# Patient Record
Sex: Female | Born: 1950 | Race: White | Hispanic: No | Marital: Single | Smoking: Former smoker
Health system: Southern US, Community
[De-identification: ages and names within clinical notes are randomized; demographics above are authoritative.]

## PROBLEM LIST (undated history)

## (undated) DIAGNOSIS — E119 Type 2 diabetes mellitus without complications: Secondary | ICD-10-CM

## (undated) DIAGNOSIS — H348192 Central retinal vein occlusion, unspecified eye, stable: Secondary | ICD-10-CM

## (undated) DIAGNOSIS — C799 Secondary malignant neoplasm of unspecified site: Secondary | ICD-10-CM

## (undated) DIAGNOSIS — C801 Malignant (primary) neoplasm, unspecified: Secondary | ICD-10-CM

## (undated) HISTORY — PX: TONSILLECTOMY: SUR1361

## (undated) HISTORY — PX: TUBAL LIGATION: SHX77

## (undated) HISTORY — PX: TUMOR REMOVAL: SHX12

## (undated) HISTORY — DX: Central retinal vein occlusion, unspecified eye, stable: H34.8192

---

## 1997-12-03 ENCOUNTER — Other Ambulatory Visit: Admission: RE | Admit: 1997-12-03 | Discharge: 1997-12-03 | Payer: Self-pay | Admitting: Obstetrics and Gynecology

## 1997-12-08 ENCOUNTER — Ambulatory Visit (HOSPITAL_BASED_OUTPATIENT_CLINIC_OR_DEPARTMENT_OTHER): Admission: RE | Admit: 1997-12-08 | Discharge: 1997-12-08 | Payer: Self-pay | Admitting: Orthopedic Surgery

## 1998-01-07 ENCOUNTER — Ambulatory Visit (HOSPITAL_COMMUNITY): Admission: RE | Admit: 1998-01-07 | Discharge: 1998-01-07 | Payer: Self-pay | Admitting: Obstetrics and Gynecology

## 1998-03-02 ENCOUNTER — Ambulatory Visit (HOSPITAL_COMMUNITY): Admission: RE | Admit: 1998-03-02 | Discharge: 1998-03-02 | Payer: Self-pay | Admitting: Obstetrics and Gynecology

## 2000-07-27 ENCOUNTER — Other Ambulatory Visit: Admission: RE | Admit: 2000-07-27 | Discharge: 2000-07-27 | Payer: Self-pay | Admitting: Obstetrics and Gynecology

## 2000-08-07 ENCOUNTER — Encounter: Admission: RE | Admit: 2000-08-07 | Discharge: 2000-08-07 | Payer: Self-pay | Admitting: Obstetrics and Gynecology

## 2000-08-07 ENCOUNTER — Encounter: Payer: Self-pay | Admitting: Obstetrics and Gynecology

## 2006-12-03 ENCOUNTER — Emergency Department (HOSPITAL_COMMUNITY): Admission: EM | Admit: 2006-12-03 | Discharge: 2006-12-03 | Payer: Self-pay | Admitting: Emergency Medicine

## 2007-04-05 ENCOUNTER — Other Ambulatory Visit: Admission: RE | Admit: 2007-04-05 | Discharge: 2007-04-05 | Payer: Self-pay | Admitting: Internal Medicine

## 2007-05-02 ENCOUNTER — Encounter: Admission: RE | Admit: 2007-05-02 | Discharge: 2007-05-02 | Payer: Self-pay | Admitting: Internal Medicine

## 2007-05-25 ENCOUNTER — Encounter: Admission: RE | Admit: 2007-05-25 | Discharge: 2007-05-25 | Payer: Self-pay | Admitting: Gastroenterology

## 2010-10-26 LAB — I-STAT 8, (EC8 V) (CONVERTED LAB)
Acid-Base Excess: 3 — ABNORMAL HIGH
BUN: 21
Bicarbonate: 27.1 — ABNORMAL HIGH
Chloride: 108
Glucose, Bld: 122 — ABNORMAL HIGH
HCT: 51 — ABNORMAL HIGH
Hemoglobin: 17.3 — ABNORMAL HIGH
Operator id: 257131
Potassium: 3.8
Sodium: 140
TCO2: 28
pCO2, Ven: 39.9 — ABNORMAL LOW
pH, Ven: 7.44 — ABNORMAL HIGH

## 2010-10-26 LAB — POCT I-STAT CREATININE
Creatinine, Ser: 1
Operator id: 257131

## 2014-08-14 ENCOUNTER — Telehealth: Payer: Self-pay | Admitting: *Deleted

## 2014-08-14 NOTE — Telephone Encounter (Signed)
Received a referral from [facility] requesting an urgent appt.  Obtained appt from Dr. Lindi Adie.  Called pt and confirmed 08/15/14 appt w/ her.  Unable to mail before appt letter - gave verbal.  Unable to mail welcoming packet - gave directions and instructions.  Unable to mail intake form - placed a note for one to be given at time of check in.  Called Jessica at [facility] to make her aware of the appt.  Called and left a message for Hilda Blades at PCP requesting the referral auth.  Placed a copy of the records in Dr. Geralyn Flash box and took one to HIM to scan.

## 2014-08-15 ENCOUNTER — Encounter: Payer: Self-pay | Admitting: *Deleted

## 2014-08-15 ENCOUNTER — Encounter (INDEPENDENT_AMBULATORY_CARE_PROVIDER_SITE_OTHER): Payer: Self-pay

## 2014-08-15 ENCOUNTER — Telehealth: Payer: Self-pay | Admitting: *Deleted

## 2014-08-15 ENCOUNTER — Other Ambulatory Visit: Payer: Self-pay | Admitting: *Deleted

## 2014-08-15 ENCOUNTER — Encounter: Payer: Self-pay | Admitting: Hematology and Oncology

## 2014-08-15 ENCOUNTER — Telehealth: Payer: Self-pay | Admitting: Hematology and Oncology

## 2014-08-15 ENCOUNTER — Ambulatory Visit (HOSPITAL_BASED_OUTPATIENT_CLINIC_OR_DEPARTMENT_OTHER): Payer: 59 | Admitting: Hematology and Oncology

## 2014-08-15 ENCOUNTER — Ambulatory Visit: Payer: 59

## 2014-08-15 VITALS — BP 163/93 | HR 48 | Temp 98.7°F | Resp 18 | Ht 65.5 in | Wt 231.5 lb

## 2014-08-15 DIAGNOSIS — C773 Secondary and unspecified malignant neoplasm of axilla and upper limb lymph nodes: Secondary | ICD-10-CM

## 2014-08-15 DIAGNOSIS — C50411 Malignant neoplasm of upper-outer quadrant of right female breast: Secondary | ICD-10-CM | POA: Diagnosis not present

## 2014-08-15 DIAGNOSIS — Z171 Estrogen receptor negative status [ER-]: Secondary | ICD-10-CM | POA: Diagnosis not present

## 2014-08-15 NOTE — Assessment & Plan Note (Signed)
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 After reviewing the pathology in detail, we proceeded to discuss the different treatment options between surgery, radiation, chemotherapy.  Recommendation: 1. Staging scans  2. Await HER-2 testing 3. If no Mets: Neoadjuvant chemo Foll by Mastectomy and XRT 4. If Her -2 Neg: AC x 4 foll by Abraxane-carboplatin weekly X 12 5. If Her-2 Positive: TCHP X 6 6. Echo 7. Port placement through IR 8. Chemo education  Wound care issues: Breast wound is bleeding and shes applying dressings on her own. I gave her a prescription for supplies.  Chemo counseling assuming No Mets and Her2 Neg: I discussed the risks and benefits of chemotherapy including the risks of nausea/ vomiting, risk of infection from low WBC count, fatigue due to chemo or anemia, bruising or bleeding due to low platelets, mouth sores, loss/ change in taste and decreased appetite. Liver and kidney function will be monitored through out chemotherapy as abnormalities in liver and kidney function may be a side effect of treatment. Cardiac dysfunction due to Adriamycin was discussed in detail. Risk of permanent bone marrow dysfunction and leukemia due to chemo were also discussed.

## 2014-08-15 NOTE — Telephone Encounter (Signed)
Called and spoke w/ Tammy at pt's PCP and obtained Referral Auth # [id number].  Good for 6 visits.  Expires 02/14/15.  Added referral and assigned appt.

## 2014-08-15 NOTE — Progress Notes (Signed)
Written Prescription provided for wound care supplies.  Pt advised to go through a medical supply company such as [employer] for cost and insurance reasons.  Pt voiced understanding.

## 2014-08-15 NOTE — Progress Notes (Signed)
Checked in new pt with no financial concerns prior to seeing the dr.  [name] has my card and Shauna's contact info for any billing questions, concerns or if financial assistance is needed.

## 2014-08-15 NOTE — Telephone Encounter (Signed)
Pet noted,ir order noted,echo to linda for precert and chemo class scheduled.  Will call patient with appointments once completed

## 2014-08-18 ENCOUNTER — Telehealth: Payer: Self-pay | Admitting: Hematology and Oncology

## 2014-08-18 NOTE — Telephone Encounter (Signed)
Called patient and she is aware of her echo  anne

## 2014-08-19 ENCOUNTER — Telehealth: Payer: Self-pay | Admitting: *Deleted

## 2014-08-19 NOTE — Progress Notes (Addendum)
[facility] CONSULT NOTE  Patient Care Team: Seward Carol, MD as PCP - General (Internal Medicine)  CHIEF COMPLAINTS/PURPOSE OF CONSULTATION:  Newly diagnosed breast cancer  HISTORY OF PRESENTING ILLNESS:  Denise Morton 64 y.o. female is here because of recent diagnosis of recent diagnosis of right breast cancer. Patient had a palpable abnormality in the right breast which she neglected for a long time to where it has now ulcerated and was bleeding. She finally decided to get some help and got it checked out. She underwent mammogram and ultrasound at [facility] for by biopsy which came back as invasive ductal carcinoma that was involving right axillary lymph node. Biopsy of the lymph node was also positive. The cancer was ER/PR negative. HER-2 is pending. She has been packing the wound with gauze all by herself. She keeps it clean as much as possible. It does bleed intermittently. Fortunately it is not very painful.  I reviewed her records extensively and collaborated the history with the patient.  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 CRVO (central retinal vein occlusion)     SURGICAL HISTORY: Past Surgical History  Procedure Laterality Date  . Tonsillectomy    . Tubal ligation    . Tumor removal      benign- 2 on leg, 1 in finger    SOCIAL HISTORY: History   Social History  . Marital Status: Single    Spouse Name: N/A  . Number of Children: N/A  . Years of Education: N/A   Occupational History  . Not on file.   Social History Main Topics  . Smoking status: Former Smoker -- 0.50 packs/day for 30 years  . Smokeless tobacco: Never Used  . Alcohol Use: No   . Drug Use: No  . Sexual Activity: No   Other Topics Concern  . Not on file   Social History Narrative  . No narrative on file    FAMILY HISTORY: No family history of breast cancer History reviewed. No pertinent family history.  ALLERGIES:  is allergic to penicillin g and sulfa antibiotics.  MEDICATIONS:  Current Outpatient Prescriptions  Medication Sig Dispense Refill  . ALPRAZolam (XANAX) 0.25 MG tablet 1 tablet     No current facility-administered medications for this visit.    REVIEW OF SYSTEMS:   Constitutional: Denies fevers, chills or abnormal night sweats Eyes: Denies blurriness of vision, double vision or watery eyes Ears, nose, mouth, throat, and face: Denies mucositis or sore throat Respiratory: Denies cough, dyspnea or wheezes Cardiovascular: Denies palpitation, chest discomfort or lower extremity swelling Gastrointestinal:  Denies nausea, heartburn or change in bowel habits Skin: Denies abnormal skin rashes Lymphatics: Denies new lymphadenopathy or easy bruising Neurological:Denies numbness, tingling or new weaknesses Behavioral/Psych: Mood is stable, no new changes  Breast: Inflammatory breast cancer which is ulcerated and bleeding in the right breast  All other systems were reviewed with the patient and are negative.  PHYSICAL EXAMINATION: ECOG PERFORMANCE STATUS: 1 - Symptomatic but completely ambulatory  Filed Vitals:   08/15/14 1134  BP: 163/93  Pulse: 48  Temp: 98.7 F (37.1 C)  Resp: 18   Filed Weights   08/15/14 1134  Weight: 231 lb  8 oz (105.008 kg)    GENERAL:alert, no distress and comfortable SKIN: skin color, texture, turgor are normal, no rashes or significant lesions EYES: normal, conjunctiva are pink and non-injected, sclera clear OROPHARYNX:no exudate, no erythema and lips, buccal mucosa, and tongue normal  NECK: supple, thyroid normal size, non-tender, without nodularity LYMPH:  no palpable lymphadenopathy in the cervical,  axillary or inguinal LUNGS: clear to auscultation and percussion with normal breathing effort HEART: regular rate & rhythm and no murmurs and no lower extremity edema ABDOMEN:abdomen soft, non-tender and normal bowel sounds Musculoskeletal:no cyanosis of digits and no clubbing  PSYCH: alert & oriented x 3 with fluent speech NEURO: no focal motor/sensory deficits BREAST: Very large palpable inflammation and ulcerated excoriated tumor in the right breast easily bleeds to touch. 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 After reviewing the pathology in detail, we proceeded to discuss the different treatment options between surgery, radiation, chemotherapy.  Recommendation: 1. Staging scans  2. Await HER-2 testing 3. If no Mets: Neoadjuvant chemo Foll by Mastectomy and XRT 4. If Her -2 Neg: AC x 4 foll by Abraxane-carboplatin weekly X 12 5. If Her-2 Positive: TCHP X 6 6. Echo 7. Port placement through IR 8. Chemo education  Wound care issues: Breast wound is bleeding and shes applying dressings on her own. I gave her a prescription for supplies.  Chemo counseling assuming No Mets and Her2 Neg: I discussed the risks and benefits of chemotherapy  including the risks of nausea/ vomiting, risk of infection from low WBC count, fatigue due to chemo or anemia, bruising or bleeding due to low platelets, mouth sores, loss/ change in taste and decreased appetite. Liver and kidney function will be monitored through out chemotherapy as abnormalities in liver and kidney function may be a side effect of treatment. Cardiac dysfunction due to Adriamycin was discussed in detail. Risk of permanent bone marrow dysfunction and leukemia due to chemo were also discussed.  All questions were answered. The patient knows to call the clinic with any problems, questions or concerns.    Rulon Eisenmenger, MD 1:44 PM   Pathology results:  Right breast biopsy: Grade 3 invasive ductal carcinoma lymph node positive HER-2 positive Left breast: Benign, left axillary lymph node positive for invasive ductal carcinoma HER-2 positive Awaiting PET CT scan to determine treatment plan.

## 2014-08-19 NOTE — Telephone Encounter (Signed)
Spoke with patient and confirmed follow up appointment with Dr. Lindi Adie for 08/27/14 at 915am.

## 2014-08-21 ENCOUNTER — Other Ambulatory Visit: Payer: 59

## 2014-08-21 ENCOUNTER — Encounter: Payer: Self-pay | Admitting: *Deleted

## 2014-08-21 ENCOUNTER — Ambulatory Visit (HOSPITAL_COMMUNITY)
Admission: RE | Admit: 2014-08-21 | Discharge: 2014-08-21 | Disposition: A | Discharge status: Home / Self Care | Payer: 59 | Source: Ambulatory Visit | Attending: Hematology and Oncology | Admitting: Hematology and Oncology

## 2014-08-21 DIAGNOSIS — I071 Rheumatic tricuspid insufficiency: Secondary | ICD-10-CM | POA: Diagnosis not present

## 2014-08-21 DIAGNOSIS — Z01810 Encounter for preprocedural cardiovascular examination: Secondary | ICD-10-CM | POA: Insufficient documentation

## 2014-08-21 DIAGNOSIS — C50411 Malignant neoplasm of upper-outer quadrant of right female breast: Secondary | ICD-10-CM | POA: Diagnosis not present

## 2014-08-21 NOTE — Progress Notes (Signed)
Echocardiogram 2D Echocardiogram has been performed.  Tresa Res 08/21/2014, 10:01 AM

## 2014-08-22 ENCOUNTER — Encounter (HOSPITAL_COMMUNITY)
Admission: RE | Admit: 2014-08-22 | Discharge: 2014-08-22 | Disposition: A | Discharge status: Home / Self Care | Payer: 59 | Source: Ambulatory Visit | Attending: Hematology and Oncology | Admitting: Hematology and Oncology

## 2014-08-22 ENCOUNTER — Other Ambulatory Visit: Payer: Self-pay | Admitting: Radiology

## 2014-08-22 DIAGNOSIS — C50411 Malignant neoplasm of upper-outer quadrant of right female breast: Secondary | ICD-10-CM | POA: Diagnosis not present

## 2014-08-22 LAB — GLUCOSE, CAPILLARY: GLUCOSE-CAPILLARY: 104 mg/dL — AB (ref 65–99)

## 2014-08-22 MED ORDER — FLUDEOXYGLUCOSE F - 18 (FDG) INJECTION
11.4400 | Freq: Once | INTRAVENOUS | Status: AC | PRN
  Administered 2014-08-22: 11.44 via INTRAVENOUS

## 2014-08-25 ENCOUNTER — Ambulatory Visit (HOSPITAL_COMMUNITY)
Admission: RE | Admit: 2014-08-25 | Discharge: 2014-08-25 | Disposition: A | Discharge status: Home / Self Care | Payer: 59 | Source: Ambulatory Visit | Attending: Hematology and Oncology | Admitting: Hematology and Oncology

## 2014-08-25 ENCOUNTER — Encounter (HOSPITAL_COMMUNITY): Payer: Self-pay

## 2014-08-25 ENCOUNTER — Other Ambulatory Visit: Payer: Self-pay | Admitting: Hematology and Oncology

## 2014-08-25 DIAGNOSIS — C50911 Malignant neoplasm of unspecified site of right female breast: Secondary | ICD-10-CM | POA: Insufficient documentation

## 2014-08-25 DIAGNOSIS — Z6837 Body mass index (BMI) 37.0-37.9, adult: Secondary | ICD-10-CM | POA: Diagnosis not present

## 2014-08-25 DIAGNOSIS — C50411 Malignant neoplasm of upper-outer quadrant of right female breast: Secondary | ICD-10-CM

## 2014-08-25 DIAGNOSIS — C7800 Secondary malignant neoplasm of unspecified lung: Secondary | ICD-10-CM | POA: Insufficient documentation

## 2014-08-25 DIAGNOSIS — C787 Secondary malignant neoplasm of liver and intrahepatic bile duct: Secondary | ICD-10-CM | POA: Diagnosis not present

## 2014-08-25 DIAGNOSIS — E669 Obesity, unspecified: Secondary | ICD-10-CM | POA: Diagnosis not present

## 2014-08-25 DIAGNOSIS — Z87891 Personal history of nicotine dependence: Secondary | ICD-10-CM | POA: Diagnosis not present

## 2014-08-25 LAB — CBC WITH DIFFERENTIAL/PLATELET
BASOS ABS: 0 10*3/uL (ref 0.0–0.1)
Basophils Relative: 0 % (ref 0–1)
Eosinophils Absolute: 0 10*3/uL (ref 0.0–0.7)
Eosinophils Relative: 0 % (ref 0–5)
HCT: 38.7 % (ref 36.0–46.0)
Hemoglobin: 12.2 g/dL (ref 12.0–15.0)
LYMPHS PCT: 25 % (ref 12–46)
Lymphs Abs: 1.9 10*3/uL (ref 0.7–4.0)
MCH: 28.1 pg (ref 26.0–34.0)
MCHC: 31.5 g/dL (ref 30.0–36.0)
MCV: 89.2 fL (ref 78.0–100.0)
Monocytes Absolute: 0.5 10*3/uL (ref 0.1–1.0)
Monocytes Relative: 7 % (ref 3–12)
NEUTROS PCT: 68 % (ref 43–77)
Neutro Abs: 4.9 10*3/uL (ref 1.7–7.7)
PLATELETS: 347 10*3/uL (ref 150–400)
RBC: 4.34 MIL/uL (ref 3.87–5.11)
RDW: 14.8 % (ref 11.5–15.5)
WBC: 7.4 10*3/uL (ref 4.0–10.5)

## 2014-08-25 LAB — PROTIME-INR
INR: 1.15 (ref 0.00–1.49)
Prothrombin Time: 14.9 seconds (ref 11.6–15.2)

## 2014-08-25 MED ORDER — HEPARIN SOD (PORK) LOCK FLUSH 100 UNIT/ML IV SOLN
INTRAVENOUS | Status: AC | PRN
  Administered 2014-08-25: 500 [IU]

## 2014-08-25 MED ORDER — HEPARIN SOD (PORK) LOCK FLUSH 100 UNIT/ML IV SOLN
INTRAVENOUS | Status: AC
  Filled 2014-08-25: qty 5

## 2014-08-25 MED ORDER — VANCOMYCIN HCL IN DEXTROSE 1-5 GM/200ML-% IV SOLN
1000.0000 mg | Freq: Once | INTRAVENOUS | Status: AC
  Administered 2014-08-25: 1000 mg via INTRAVENOUS
  Filled 2014-08-25: qty 200

## 2014-08-25 MED ORDER — FENTANYL CITRATE (PF) 100 MCG/2ML IJ SOLN
INTRAMUSCULAR | Status: AC | PRN
  Administered 2014-08-25: 50 ug via INTRAVENOUS
  Administered 2014-08-25: 25 ug via INTRAVENOUS

## 2014-08-25 MED ORDER — FENTANYL CITRATE (PF) 100 MCG/2ML IJ SOLN
INTRAMUSCULAR | Status: AC
Start: 2014-08-25 — End: 2014-08-26
  Filled 2014-08-25: qty 2

## 2014-08-25 MED ORDER — MIDAZOLAM HCL 2 MG/2ML IJ SOLN
INTRAMUSCULAR | Status: AC | PRN
  Administered 2014-08-25: 0.5 mg via INTRAVENOUS
  Administered 2014-08-25: 1 mg via INTRAVENOUS
  Administered 2014-08-25 (×2): 0.5 mg via INTRAVENOUS

## 2014-08-25 MED ORDER — SODIUM CHLORIDE 0.9 % IV SOLN
INTRAVENOUS | Status: DC
  Administered 2014-08-25: 13:00:00 via INTRAVENOUS

## 2014-08-25 MED ORDER — MIDAZOLAM HCL 2 MG/2ML IJ SOLN
INTRAMUSCULAR | Status: AC
  Filled 2014-08-25: qty 4

## 2014-08-25 MED ORDER — LIDOCAINE-EPINEPHRINE 2 %-1:100000 IJ SOLN
INTRAMUSCULAR | Status: AC
  Filled 2014-08-25: qty 1

## 2014-08-25 NOTE — H&P (Signed)
Chief Complaint: Patient was seen in consultation today for Port-A-Cath placement   Referring Physician(s): Gudena,Vinay  History of Present Illness: Denise Morton is a 64 y.o. female with history of recently diagnosed stage IIIC invasive inflammatory ductal carcinoma of the right breast . Patient underwent right breast biopsy at [facility] approximately 2 weeks ago. She presents today for Port-A-Cath placement for chemotherapy. Prior to and since biopsy patient has had continued drainage , scabbing and erythema of entire right breast . She denies any recent fevers or chills and has not received antibiotic therapy recently .   Past Medical History  Diagnosis Date  . CRVO (central retinal vein occlusion)     Past Surgical History  Procedure Laterality Date  . Tonsillectomy    . Tubal ligation    . Tumor removal      benign- 2 on leg, 1 in finger    Allergies: Penicillin g and Sulfa antibiotics  Medications: Prior to Admission medications   Medication Sig Start Date End Date Taking? Authorizing Provider  acetaminophen (TYLENOL) 500 MG tablet Take 1,000 mg by mouth every 6 (six) hours as needed for moderate pain.   Yes Historical Provider, MD  ALPRAZolam Duanne Moron) 0.25 MG tablet Take 0.25 mg by mouth daily as needed for anxiety.   Yes Historical Provider, MD  Multiple Vitamin (MULTIVITAMIN WITH MINERALS) TABS tablet Take 1 tablet by mouth daily.   Yes Historical Provider, MD     History reviewed. No pertinent family history.  History   Social History  . Marital Status: Single    Spouse Name: N/A  . Number of Children: N/A  . Years of Education: N/A   Social History Main Topics  . Smoking status: Former Smoker -- 0.50 packs/day for 30 years  . Smokeless tobacco: Never Used  . Alcohol Use: No  . Drug Use: No  . Sexual Activity: No   Other Topics Concern  . None   Social History Narrative      Review of Systems  Constitutional: Negative for fever and chills.    Respiratory: Positive for cough. Negative for shortness of breath.   Cardiovascular: Negative for chest pain.  Gastrointestinal: Negative for nausea, vomiting, abdominal pain and blood in stool.  Genitourinary: Negative for dysuria and hematuria.  Musculoskeletal: Negative for back pain.  Skin:       Redness, drainage and scabbing of right breast  Neurological: Negative for headaches.    Vital Signs: BP 163/113 mmHg  Pulse 90  Temp(Src) 97.9 F (36.6 C) (Oral)  Resp 18  Ht 5' 5.5" (1.664 m)  Wt 227 lb (102.967 kg)  BMI 37.19 kg/m2  SpO2 96%  LMP   Physical Exam  Constitutional: She is oriented to person, place, and time. She appears well-developed and well-nourished.  Cardiovascular: Normal rate and regular rhythm.   Pulmonary/Chest: Effort normal and breath sounds normal.  Abdominal: Soft. Bowel sounds are normal. There is no tenderness.  Obese  Musculoskeletal: Normal range of motion. She exhibits edema.  Neurological: She is alert and oriented to person, place, and time.  Skin:  Portion of right upper chest near breast with intact gauze dressing, skin erythematous with light yellow fluid noted on gauze.    Mallampati Score:     Imaging: Nm Pet Image Initial (pi) Skull Base To Thigh  08/22/2014   CLINICAL DATA:  Initial treatment strategy for breast cancer.  EXAM: NUCLEAR MEDICINE PET SKULL BASE TO THIGH  TECHNIQUE: 11.4 MCi F-18 FDG was injected  intravenously. Full-ring PET imaging was performed from the skull base to thigh after the radiotracer. CT data was obtained and used for attenuation correction and anatomic localization.  FASTING BLOOD GLUCOSE:  Value: 104 mg/dl  COMPARISON:  None.  FINDINGS: NECK  No hypermetabolic lymph nodes in the neck.  CHEST  10 mm short axis right supraclavicular lymph node (image 47 series 4) is hypermetabolic with SUV max = 6.6.  The marked skin thickening and abnormal soft tissue attenuation infiltrating the right breast is markedly  hypermetabolic with SUV max = 66.0. Associated right axillary lymphadenopathy is compatible with hypermetabolic metastatic involvement. A dominant right axillary lymph node measuring 13 mm in short axis demonstrates SUV max = 10.0. Hypermetabolic lymphadenopathy is seen to a slightly lesser degree in the left axilla and there is associated hypermetabolic lymphadenopathy in the right thoracic inlet and right mediastinum.  16 mm right apical pulmonary nodule is hypermetabolic. 2.0 cm nodule in the anterior right middle lobe (image 82 series 4) is hypermetabolic with SUV max = 8.5. 17 mm nodule in the posterior left lower lobe is hypermetabolic.  ABDOMEN/PELVIS  Lateral segment of the left liver is essentially replaced by hypermetabolic metastatic disease. 3.2 cm lesion in the medial segment left liver demonstrates SUV max = 11.2. Other right and left hypermetabolic liver metastases are evident.  Nonobstructing stones are seen in both kidneys. There is abdominal aortic atherosclerosis without aneurysm. Insert diverticulosis diffuse there is a trace amount of intraperitoneal free fluid in the anatomic pelvis.  SKELETON  Pathologic fracture of the posterior left ninth rib (image 103 series 4) is hypermetabolic.  IMPRESSION: Hypermetabolic disease in the right breast, compatible with patient's known history of inflammatory invasive ductal carcinoma. This is associated with hypermetabolic lymphadenopathy in the right supraclavicular space, both axilla and mediastinum.  Bulky metastatic disease in the liver with lateral segment left liver essentially replaced by tumor.  Hypermetabolic pulmonary metastases.  Hypermetabolic pathologic fracture in the left ninth rib.   Electronically Signed   By: Misty Stanley M.D.   On: 08/22/2014 09:45    Labs:  CBC: No results for input(s): WBC, HGB, HCT, PLT in the last 8760 hours.  COAGS: No results for input(s): INR, APTT in the last 8760 hours.  BMP: No results for input(s):  NA, K, CL, CO2, GLUCOSE, BUN, CALCIUM, CREATININE, GFRNONAA, GFRAA in the last 8760 hours.  Invalid input(s): CMP  LIVER FUNCTION TESTS: No results for input(s): BILITOT, AST, ALT, ALKPHOS, PROT, ALBUMIN in the last 8760 hours.  TUMOR MARKERS: No results for input(s): AFPTM, CEA, CA199, CHROMGRNA in the last 8760 hours.  Assessment and Plan: Patient with newly diagnosed stage IIIC inflammatory invasive ductal carcinoma of the right breast with persistent erythema, drainage and scab formation involving right breast. Request now made for Port-A-Cath placement for chemotherapy.Risks and benefits discussed with the patient including, but not limited to bleeding, infection, pneumothorax, or fibrin sheath development and need for additional procedures.All of the patient's questions were answered, patient is agreeable to proceed.Consent signed and in chart. Following discussion with Dr.Gudena decision will be made for placement of left-sided Port-A-Cath versus PICC today in view of patient's persistent inflammatory skin changes of right breast tissue.     Thank you for this interesting consult.  I greatly enjoyed meeting Eleanora Vandall and look forward to participating in their care.  A copy of this report was sent to the requesting provider on this date.  Signed: D. Lennette Bihari Syrenity Klepacki 08/25/2014, 1:40 PM   I  spent a total of 30 minutes in face to face in clinical consultation, greater than 50% of which was counseling/coordinating care for Port-A-Cath versus PICC line placement

## 2014-08-25 NOTE — Discharge Instructions (Signed)
Implanted Port Insertion, Care After °Refer to this sheet in the next few weeks. These instructions provide you with information on caring for yourself after your procedure. Your health care provider may also give you more specific instructions. Your treatment has been planned according to current medical practices, but problems sometimes occur. Call your health care provider if you have any problems or questions after your procedure. °WHAT TO EXPECT AFTER THE PROCEDURE °After your procedure, it is typical to have the following:  °· Discomfort at the port insertion site. Ice packs to the area will help. °· Bruising on the skin over the port. This will subside in 3-4 days. °HOME CARE INSTRUCTIONS °· After your port is placed, you will get a manufacturer's information card. The card has information about your port. Keep this card with you at all times.   °· Know what kind of port you have. There are many types of ports available.   °· Wear a medical alert bracelet in case of an emergency. This can help alert health care workers that you have a port.   °· The port can stay in for as long as your health care provider believes it is necessary.   °· A home health care nurse may give medicines and take care of the port.   °· You or a family member can get special training and directions for giving medicine and taking care of the port at home.   °SEEK MEDICAL CARE IF:  °· Your port does not flush or you are unable to get a blood return.   °· You have a fever or chills. °SEEK IMMEDIATE MEDICAL CARE IF: °· You have new fluid or pus coming from your incision.   °· You notice a bad smell coming from your incision site.   °· You have swelling, pain, or more redness at the incision or port site.   °· You have chest pain or shortness of breath. °Document Released: 10/24/2012 Document Revised: 01/08/2013 Document Reviewed: 10/24/2012 °ExitCare® Patient Information ©2015 [employer]. This information is not intended to replace  advice given to you by your health care provider. Make sure you discuss any questions you have with your health care provider. °Implanted Port Home Guide °An implanted port is a type of central line that is placed under the skin. Central lines are used to provide IV access when treatment or nutrition needs to be given through a person's veins. Implanted ports are used for long-term IV access. An implanted port may be placed because:  °· You need IV medicine that would be irritating to the small veins in your hands or arms.   °· You need long-term IV medicines, such as antibiotics.   °· You need IV nutrition for a long period.   °· You need frequent blood draws for lab tests.   °· You need dialysis.   °Implanted ports are usually placed in the chest area, but they can also be placed in the upper arm, the abdomen, or the leg. An implanted port has two main parts:  °· Reservoir. The reservoir is round and will appear as a small, raised area under your skin. The reservoir is the part where a needle is inserted to give medicines or draw blood.   °· Catheter. The catheter is a thin, flexible tube that extends from the reservoir. The catheter is placed into a large vein. Medicine that is inserted into the reservoir goes into the catheter and then into the vein.   °HOW WILL I CARE FOR MY INCISION SITE? °Do not get the incision site wet. Bathe or   shower as directed by your health care provider.  °HOW IS MY PORT ACCESSED? °Special steps must be taken to access the port:  °· Before the port is accessed, a numbing cream can be placed on the skin. This helps numb the skin over the port site.   °· Your health care provider uses a sterile technique to access the port. °· Your health care provider must put on a mask and sterile gloves. °· The skin over your port is cleaned carefully with an antiseptic and allowed to dry. °· The port is gently pinched between sterile gloves, and a needle is inserted into the port. °· Only  "non-coring" port needles should be used to access the port. Once the port is accessed, a blood return should be checked. This helps ensure that the port is in the vein and is not clogged.   °· If your port needs to remain accessed for a constant infusion, a clear (transparent) bandage will be placed over the needle site. The bandage and needle will need to be changed every week, or as directed by your health care provider.   °· Keep the bandage covering the needle clean and dry. Do not get it wet. Follow your health care provider's instructions on how to take a shower or bath while the port is accessed.   °· If your port does not need to stay accessed, no bandage is needed over the port.   °WHAT IS FLUSHING? °Flushing helps keep the port from getting clogged. Follow your health care provider's instructions on how and when to flush the port. Ports are usually flushed with saline solution or a medicine called heparin. The need for flushing will depend on how the port is used.  °· If the port is used for intermittent medicines or blood draws, the port will need to be flushed:   °· After medicines have been given.   °· After blood has been drawn.   °· As part of routine maintenance.   °· If a constant infusion is running, the port may not need to be flushed.   °HOW LONG WILL MY PORT STAY IMPLANTED? °The port can stay in for as long as your health care provider thinks it is needed. When it is time for the port to come out, surgery will be done to remove it. The procedure is similar to the one performed when the port was put in.  °WHEN SHOULD I SEEK IMMEDIATE MEDICAL CARE? °When you have an implanted port, you should seek immediate medical care if:  °· You notice a bad smell coming from the incision site.   °· You have swelling, redness, or drainage at the incision site.   °· You have more swelling or pain at the port site or the surrounding area.   °· You have a fever that is not controlled with medicine. °Document  Released: 01/03/2005 Document Revised: 10/24/2012 Document Reviewed: 09/10/2012 °ExitCare® Patient Information ©2015 [employer]. This information is not intended to replace advice given to you by your health care provider. Make sure you discuss any questions you have with your health care provider.Conscious Sedation, Adult, Care After °Refer to this sheet in the next few weeks. These instructions provide you with information on caring for yourself after your procedure. Your health care provider may also give you more specific instructions. Your treatment has been planned according to current medical practices, but problems sometimes occur. Call your health care provider if you have any problems or questions after your procedure. °WHAT TO EXPECT AFTER THE PROCEDURE  °After your procedure: °·   You may feel sleepy, clumsy, and have poor balance for several hours. °· Vomiting may occur if you eat too soon after the procedure. °HOME CARE INSTRUCTIONS °· Do not participate in any activities where you could become injured for at least 24 hours. Do not: °¨ Drive. °¨ Swim. °¨ Ride a bicycle. °¨ Operate heavy machinery. °¨ Cook. °¨ Use power tools. °¨ Climb ladders. °¨ Work from a high place. °· Do not make important decisions or sign legal documents until you are improved. °· If you vomit, drink water, juice, or soup when you can drink without vomiting. Make sure you have little or no nausea before eating solid foods. °· Only take over-the-counter or prescription medicines for pain, discomfort, or fever as directed by your health care provider. °· Make sure you and your family fully understand everything about the medicines given to you, including what side effects may occur. °· You should not drink alcohol, take sleeping pills, or take medicines that cause drowsiness for at least 24 hours. °· If you smoke, do not smoke without supervision. °· If you are feeling better, you may resume normal activities 24 hours after you  were sedated. °· Keep all appointments with your health care provider. °SEEK MEDICAL CARE IF: °· Your skin is pale or bluish in color. °· You continue to feel nauseous or vomit. °· Your pain is getting worse and is not helped by medicine. °· You have bleeding or swelling. °· You are still sleepy or feeling clumsy after 24 hours. °SEEK IMMEDIATE MEDICAL CARE IF: °· You develop a rash. °· You have difficulty breathing. °· You develop any type of allergic problem. °· You have a fever. °MAKE SURE YOU: °· Understand these instructions. °· Will watch your condition. °· Will get help right away if you are not doing well or get worse. °Document Released: 10/24/2012 Document Reviewed: 10/24/2012 °ExitCare® Patient Information ©2015 [employer]. This information is not intended to replace advice given to you by your health care provider. Make sure you discuss any questions you have with your health care provider. ° °

## 2014-08-25 NOTE — Procedures (Signed)
Successful placement of left IJ approach port-a-cath with tip at the superior caval atrial junction. The catheter is ready for immediate use. No immediate post procedural complications.  Jay Darshay Deupree, MD Pager #: [phone]   

## 2014-08-25 NOTE — Progress Notes (Signed)
Pt's bp remained high while here today.  Advised pt to follow up with her PCP regarding bp.  Pt states she will and voiced understanding.

## 2014-08-26 NOTE — Assessment & Plan Note (Signed)
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 In addition to Right breast, hypermetabolic disease in Rt [address] LN, axilla and mediastinum, bulky disease in liver, Pulm Mets, Path fracture 9th rib  Treatment goals: Palliation Treatment Plan:

## 2014-08-27 ENCOUNTER — Encounter: Payer: Self-pay | Admitting: Hematology and Oncology

## 2014-08-27 ENCOUNTER — Ambulatory Visit (HOSPITAL_COMMUNITY): Payer: 59

## 2014-08-27 ENCOUNTER — Telehealth: Payer: Self-pay | Admitting: Hematology and Oncology

## 2014-08-27 ENCOUNTER — Ambulatory Visit (HOSPITAL_BASED_OUTPATIENT_CLINIC_OR_DEPARTMENT_OTHER): Payer: 59 | Admitting: Hematology and Oncology

## 2014-08-27 VITALS — BP 182/80 | HR 94 | Temp 98.1°F | Resp 20 | Ht 65.5 in | Wt 230.6 lb

## 2014-08-27 DIAGNOSIS — C7951 Secondary malignant neoplasm of bone: Secondary | ICD-10-CM

## 2014-08-27 DIAGNOSIS — Z171 Estrogen receptor negative status [ER-]: Secondary | ICD-10-CM

## 2014-08-27 DIAGNOSIS — C778 Secondary and unspecified malignant neoplasm of lymph nodes of multiple regions: Secondary | ICD-10-CM

## 2014-08-27 DIAGNOSIS — C7800 Secondary malignant neoplasm of unspecified lung: Secondary | ICD-10-CM

## 2014-08-27 DIAGNOSIS — C50411 Malignant neoplasm of upper-outer quadrant of right female breast: Secondary | ICD-10-CM

## 2014-08-27 DIAGNOSIS — C787 Secondary malignant neoplasm of liver and intrahepatic bile duct: Secondary | ICD-10-CM

## 2014-08-27 MED ORDER — CARVEDILOL 6.25 MG PO TABS: 6.2500 mg | ORAL_TABLET | Freq: Two times a day (BID) | ORAL | Status: DC

## 2014-08-27 MED ORDER — ONDANSETRON HCL 8 MG PO TABS: 8.0000 mg | ORAL_TABLET | Freq: Two times a day (BID) | ORAL | Status: DC

## 2014-08-27 MED ORDER — PROCHLORPERAZINE MALEATE 10 MG PO TABS: 10.0000 mg | ORAL_TABLET | Freq: Four times a day (QID) | ORAL | Status: DC | PRN

## 2014-08-27 MED ORDER — LIDOCAINE-PRILOCAINE 2.5-2.5 % EX CREA: TOPICAL_CREAM | CUTANEOUS | Status: DC

## 2014-08-27 NOTE — Telephone Encounter (Signed)
Gave avs & calendar for August. Sent message to schedule treatment for 08/17

## 2014-08-27 NOTE — Progress Notes (Signed)
Patient Care Team: Seward Carol, MD as PCP - General (Internal Medicine)  DIAGNOSIS: No matching staging information was found for the patient.  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 PET/CT scan showed extensive metastatic disease involving the liver, lungs, ninth rib. She is here today accompanied by her son to discuss a treatment plan. She reports no new symptoms other than the inflammatory changes in the breasts which we have have known.  REVIEW OF SYSTEMS:   Constitutional: Denies fevers, chills or abnormal weight loss Eyes: Denies blurriness of vision Ears, nose, mouth, throat, and face: Denies mucositis or sore throat Respiratory: Denies cough, dyspnea or wheezes Cardiovascular: Denies palpitation, chest discomfort or lower extremity swelling Gastrointestinal:  Denies nausea, heartburn or change in bowel habits Skin: Denies abnormal skin rashes Lymphatics: Denies new lymphadenopathy or easy bruising Neurological:Denies numbness, tingling or new weaknesses Behavioral/Psych: Mood is stable, no new changes  Breast: Inflammatory right breast cancer All other systems were reviewed with the patient and are negative.  I have reviewed the past medical history, past surgical history, social history and  family history with the patient and they are unchanged from previous note.  ALLERGIES:  is allergic to penicillin g and sulfa antibiotics.  MEDICATIONS:  Current Outpatient Prescriptions  Medication Sig Dispense Refill  . acetaminophen (TYLENOL) 500 MG tablet Take 1,000 mg by mouth every 6 (six) hours as needed for moderate pain.    Marland Kitchen ALPRAZolam (XANAX) 0.25 MG tablet Take 0.25 mg by mouth daily as needed for anxiety.    . Multiple Vitamin (MULTIVITAMIN WITH MINERALS) TABS tablet Take 1 tablet by mouth daily.    . carvedilol (COREG) 6.25 MG tablet Take 1 tablet (6.25 mg total) by mouth 2 (two) times daily with a meal. 60 tablet 3  . lidocaine-prilocaine (EMLA) cream Apply to affected area once 30 g 3  . ondansetron (ZOFRAN) 8 MG tablet Take 1 tablet (8 mg total) by mouth 2 (two) times daily. Start the day after chemo for 2 days. Then take as needed for nausea or vomiting. 30 tablet 1  . prochlorperazine (COMPAZINE) 10 MG tablet Take 1 tablet (10 mg total) by mouth every 6 (six) hours as needed (Nausea or vomiting). 30 tablet 1   No current facility-administered medications for this visit.    PHYSICAL EXAMINATION: ECOG PERFORMANCE STATUS: 1 - Symptomatic but completely ambulatory  Filed Vitals:   08/27/14 0916  BP: 182/80  Pulse: 94  Temp: 98.1 F (36.7 C)  Resp: 20   Filed Weights   08/27/14 0916  Weight: 230 lb 9.6 oz (104.599 kg)    GENERAL:alert, no distress and comfortable SKIN: skin color, texture, turgor are normal, no rashes or significant lesions EYES: normal, Conjunctiva are pink and non-injected, sclera clear OROPHARYNX:no exudate, no erythema and lips, buccal mucosa, and tongue normal  NECK: supple, thyroid normal  size, non-tender, without nodularity LYMPH:  no palpable lymphadenopathy in the cervical, axillary or inguinal LUNGS: clear to auscultation and percussion with normal breathing effort HEART: regular rate & rhythm and no murmurs and no lower extremity  edema ABDOMEN:abdomen soft, non-tender and normal bowel sounds Musculoskeletal:no cyanosis of digits and no clubbing  NEURO: alert & oriented x 3 with fluent speech, no focal motor/sensory deficits  LABORATORY DATA:  I have reviewed the data as listed   Chemistry      Component Value Date/Time   NA 140 12/03/2006 1854   K 3.8 12/03/2006 1854   CL 108 12/03/2006 1854   BUN 21 12/03/2006 1854   CREATININE 1.0 12/03/2006 1854   No results found for: CALCIUM, ALKPHOS, AST, ALT, BILITOT     Lab Results  Component Value Date   WBC 7.4 08/25/2014   HGB 12.2 08/25/2014   HCT 38.7 08/25/2014   MCV 89.2 08/25/2014   PLT 347 08/25/2014   NEUTROABS 4.9 08/25/2014     RADIOGRAPHIC STUDIES: I have personally reviewed the radiology reports and agreed with their findings. Ir Fluoro Guide Cv Line Left  08/25/2014   INDICATION: History of inflammatory right-sided breast cancer. In need of durable intravenous access for administration of chemotherapy.  EXAM: IMPLANTED PORT A CATH PLACEMENT WITH ULTRASOUND AND FLUOROSCOPIC GUIDANCE  COMPARISON:  PET-CT - 08/22/2014  MEDICATIONS: Vancomycin, 1 g IV; The antibiotic was administered within an appropriate time interval prior to skin puncture.  ANESTHESIA/SEDATION: Versed 2.5 mg IV; Fentanyl 75 mcg IV;  Total Moderate Sedation Time  29 minutes.  CONTRAST:  None  FLUOROSCOPY TIME:  1 minute, 6 seconds.  COMPLICATIONS: None immediate  PROCEDURE: The procedure, risks, benefits, and alternatives were explained to the patient. Questions regarding the procedure were encouraged and answered. The patient understands and consents to the procedure.  Given history of right-sided inflammatory breast cancer, the left neck and chest were prepped with chlorhexidine in a sterile fashion, and a sterile drape was applied covering the operative field. Maximum barrier sterile technique with sterile gowns and gloves were used for the procedure. A timeout was performed prior to  the initiation of the procedure. Local anesthesia was provided with 1% lidocaine with epinephrine.  After creating a small venotomy incision, a micropuncture kit was utilized to access the internal jugular vein under direct, real-time ultrasound guidance. Ultrasound image documentation was performed. The microwire was kinked to measure appropriate catheter length.  A subcutaneous port pocket was then created along the upper chest wall utilizing a combination of sharp and blunt dissection. The pocket was irrigated with sterile saline. A single lumen power injectable port was chosen for placement. The 8 Fr catheter was tunneled from the port pocket site to the venotomy incision. The port was placed in the pocket. The external catheter was trimmed to appropriate length. At the venotomy, an 8 Fr peel-away sheath was placed over a guidewire under fluoroscopic guidance. The catheter was then placed through the sheath and the sheath was removed. Final catheter positioning was confirmed and documented with a fluoroscopic spot radiograph. The port was accessed with a Huber needle, aspirated and flushed with heparinized saline.  The venotomy site was closed with an interrupted 4-0 Vicryl suture. The port pocket incision was closed with interrupted 2-0 Vicryl suture and the skin was opposed with a running subcuticular 4-0 Vicryl suture. Dermabond and Steri-strips were applied to both incisions. Dressings were placed. The patient tolerated the procedure well without immediate post procedural complication.  FINDINGS:  After catheter placement, the tip lies within the superior cavoatrial junction. The catheter aspirates and flushes normally and is ready for immediate use.  IMPRESSION: Successful placement of a left internal jugular approach power injectable Port-A-Cath. The catheter is ready for immediate use.   Electronically Signed   By: Sandi Mariscal M.D.   On: 08/25/2014 16:17   Ir US Guide Vasc Access Left  08/25/2014    INDICATION: History of inflammatory right-sided breast cancer. In need of durable intravenous access for administration of chemotherapy.  EXAM: IMPLANTED PORT A CATH PLACEMENT WITH ULTRASOUND AND FLUOROSCOPIC GUIDANCE  COMPARISON:  PET-CT - 08/22/2014  MEDICATIONS: Vancomycin, 1 g IV; The antibiotic was administered within an appropriate time interval prior to skin puncture.  ANESTHESIA/SEDATION: Versed 2.5 mg IV; Fentanyl 75 mcg IV;  Total Moderate Sedation Time  29 minutes.  CONTRAST:  None  FLUOROSCOPY TIME:  1 minute, 6 seconds.  COMPLICATIONS: None immediate  PROCEDURE: The procedure, risks, benefits, and alternatives were explained to the patient. Questions regarding the procedure were encouraged and answered. The patient understands and consents to the procedure.  Given history of right-sided inflammatory breast cancer, the left neck and chest were prepped with chlorhexidine in a sterile fashion, and a sterile drape was applied covering the operative field. Maximum barrier sterile technique with sterile gowns and gloves were used for the procedure. A timeout was performed prior to the initiation of the procedure. Local anesthesia was provided with 1% lidocaine with epinephrine.  After creating a small venotomy incision, a micropuncture kit was utilized to access the internal jugular vein under direct, real-time ultrasound guidance. Ultrasound image documentation was performed. The microwire was kinked to measure appropriate catheter length.  A subcutaneous port pocket was then created along the upper chest wall utilizing a combination of sharp and blunt dissection. The pocket was irrigated with sterile saline. A single lumen power injectable port was chosen for placement. The 8 Fr catheter was tunneled from the port pocket site to the venotomy incision. The port was placed in the pocket. The external catheter was trimmed to appropriate length. At the venotomy, an 8 Fr peel-away sheath was placed over a  guidewire under fluoroscopic guidance. The catheter was then placed through the sheath and the sheath was removed. Final catheter positioning was confirmed and documented with a fluoroscopic spot radiograph. The port was accessed with a Huber needle, aspirated and flushed with heparinized saline.  The venotomy site was closed with an interrupted 4-0 Vicryl suture. The port pocket incision was closed with interrupted 2-0 Vicryl suture and the skin was opposed with a running subcuticular 4-0 Vicryl suture. Dermabond and Steri-strips were applied to both incisions. Dressings were placed. The patient tolerated the procedure well without immediate post procedural complication.  FINDINGS: After catheter placement, the tip lies within the superior cavoatrial junction. The catheter aspirates and flushes normally and is ready for immediate use.  IMPRESSION: Successful placement of a left internal jugular approach power injectable Port-A-Cath. The catheter is ready for immediate use.   Electronically Signed   By: Sandi Mariscal M.D.   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 In addition to Right breast, hypermetabolic disease in Rt [address] LN, axilla and mediastinum, bulky  disease in liver, Pulm Mets, Path fracture 9th rib  Treatment goals: Palliation, to shrink the tumors, to alleviate symptoms, prolong her life Treatment Plan: Abraxane, Herceptin, Perjeta Q 3 weeks X 6 cycles followed by Herceptin-Perjeta Maintenance Chemotherapy counseling: I discussed the risks and benefits of chemotherapy per the risk of infection, cytopenias, neuropathy, also discusses a risks and benefits of Herceptin and Perjeta including cardiotoxicity as  well as diarrhea related to Perjeta. Echocardiogram: Ejection fraction 55-60%  Prognosis: Depend on response to treatment. Patient still has excellent performance status. Patient was not chemotherapy next week and I plan to see her back the week after for tox check.   Orders Placed This Encounter  Procedures  . CBC with Differential    Standing Status: Standing     Number of Occurrences: 20     Standing Expiration Date: 08/28/2015  . Comprehensive metabolic panel    Standing Status: Standing     Number of Occurrences: 20     Standing Expiration Date: 08/28/2015   The patient has a good understanding of the overall plan. she agrees with it. she will call with any problems that may develop before the next visit here.   Rulon Eisenmenger, MD

## 2014-08-27 NOTE — Addendum Note (Signed)
Addended by: Prentiss Bells on: 08/27/2014 03:48 PM   Modules accepted: Orders

## 2014-08-27 NOTE — Progress Notes (Signed)
Pathology report rcvd 08/13/2014 rcvd from [place].  Reviewed by Dr. Lindi Adie.  Sent to scan.

## 2014-08-29 ENCOUNTER — Telehealth: Payer: Self-pay | Admitting: Nutrition

## 2014-08-29 ENCOUNTER — Telehealth: Payer: Self-pay | Admitting: *Deleted

## 2014-08-29 NOTE — Telephone Encounter (Signed)
Per staff message and POF I have scheduled appts. Advised scheduler of appts. JMW  

## 2014-08-29 NOTE — Telephone Encounter (Signed)
Patient requesting call to explain dietitian role during treatment. Contacted patient on cell phone and had to leave a message. Patient has my contact information.

## 2014-09-01 ENCOUNTER — Telehealth: Payer: Self-pay | Admitting: Hematology and Oncology

## 2014-09-01 ENCOUNTER — Encounter: Payer: Self-pay | Admitting: *Deleted

## 2014-09-01 ENCOUNTER — Other Ambulatory Visit: Payer: Self-pay | Admitting: *Deleted

## 2014-09-01 DIAGNOSIS — C50411 Malignant neoplasm of upper-outer quadrant of right female breast: Secondary | ICD-10-CM

## 2014-09-01 MED ORDER — ONDANSETRON HCL 8 MG PO TABS: 8.0000 mg | ORAL_TABLET | Freq: Two times a day (BID) | ORAL | Status: DC

## 2014-09-01 MED ORDER — LIDOCAINE-PRILOCAINE 2.5-2.5 % EX CREA: TOPICAL_CREAM | CUTANEOUS | Status: DC

## 2014-09-01 MED ORDER — PROCHLORPERAZINE MALEATE 10 MG PO TABS: 10.0000 mg | ORAL_TABLET | Freq: Four times a day (QID) | ORAL | Status: DC | PRN

## 2014-09-01 NOTE — Progress Notes (Signed)
Received call from [place] that insurance will not approve Abraxane.  I called peer to peer and was unable to get approval.  She will need to try Taxol first.  In Dr. Geralyn Flash absence per Dr. Jana Hakim have pharmacy change to weekly Taxol and then is she does not tolerate Taxol we can change to Abraxane.  I have let Ebony know so she can get approval.   I sent prescriptions to patient's pharmacy and spoke with her to discuss the change and side effects and made sure she understood her medications for nausea. I also confirmed appointment for next week 8/24 for labs and Dr. Lindi Adie and chemo.  Encouraged her to call with any needs or concerns.

## 2014-09-01 NOTE — Telephone Encounter (Signed)
Appointments made per pof and patient will get a new avs/schedule this week in chemo

## 2014-09-02 ENCOUNTER — Other Ambulatory Visit: Payer: Self-pay | Admitting: *Deleted

## 2014-09-02 DIAGNOSIS — C50411 Malignant neoplasm of upper-outer quadrant of right female breast: Secondary | ICD-10-CM

## 2014-09-02 MED ORDER — DEXAMETHASONE 4 MG PO TABS: 8.0000 mg | ORAL_TABLET | Freq: Two times a day (BID) | ORAL | Status: DC

## 2014-09-03 ENCOUNTER — Other Ambulatory Visit (HOSPITAL_BASED_OUTPATIENT_CLINIC_OR_DEPARTMENT_OTHER): Payer: 59

## 2014-09-03 ENCOUNTER — Other Ambulatory Visit: Payer: Self-pay | Admitting: Hematology and Oncology

## 2014-09-03 ENCOUNTER — Ambulatory Visit (HOSPITAL_BASED_OUTPATIENT_CLINIC_OR_DEPARTMENT_OTHER): Payer: 59

## 2014-09-03 ENCOUNTER — Ambulatory Visit (HOSPITAL_BASED_OUTPATIENT_CLINIC_OR_DEPARTMENT_OTHER): Payer: 59 | Admitting: Nurse Practitioner

## 2014-09-03 ENCOUNTER — Other Ambulatory Visit: Payer: Self-pay | Admitting: Oncology

## 2014-09-03 VITALS — BP 198/92 | HR 96 | Temp 98.8°F | Resp 18

## 2014-09-03 DIAGNOSIS — C50411 Malignant neoplasm of upper-outer quadrant of right female breast: Secondary | ICD-10-CM

## 2014-09-03 DIAGNOSIS — Z5112 Encounter for antineoplastic immunotherapy: Secondary | ICD-10-CM | POA: Diagnosis not present

## 2014-09-03 DIAGNOSIS — T7840XA Allergy, unspecified, initial encounter: Secondary | ICD-10-CM

## 2014-09-03 LAB — CBC WITH DIFFERENTIAL/PLATELET
BASO%: 0.9 % (ref 0.0–2.0)
Basophils Absolute: 0.1 10*3/uL (ref 0.0–0.1)
EOS%: 1 % (ref 0.0–7.0)
Eosinophils Absolute: 0.1 10*3/uL (ref 0.0–0.5)
HCT: 38.1 % (ref 34.8–46.6)
HGB: 12.3 g/dL (ref 11.6–15.9)
LYMPH%: 15.1 % (ref 14.0–49.7)
MCH: 27.8 pg (ref 25.1–34.0)
MCHC: 32.4 g/dL (ref 31.5–36.0)
MCV: 86 fL (ref 79.5–101.0)
MONO#: 0.7 10*3/uL (ref 0.1–0.9)
MONO%: 7.3 % (ref 0.0–14.0)
NEUT%: 75.7 % (ref 38.4–76.8)
NEUTROS ABS: 7.2 10*3/uL — AB (ref 1.5–6.5)
Platelets: 311 10*3/uL (ref 145–400)
RBC: 4.43 10*6/uL (ref 3.70–5.45)
RDW: 15.1 % — ABNORMAL HIGH (ref 11.2–14.5)
WBC: 9.5 10*3/uL (ref 3.9–10.3)
lymph#: 1.4 10*3/uL (ref 0.9–3.3)

## 2014-09-03 LAB — COMPREHENSIVE METABOLIC PANEL (CC13)
ALT: 23 U/L (ref 0–55)
ANION GAP: 10 meq/L (ref 3–11)
AST: 40 U/L — ABNORMAL HIGH (ref 5–34)
Albumin: 3 g/dL — ABNORMAL LOW (ref 3.5–5.0)
Alkaline Phosphatase: 91 U/L (ref 40–150)
BILIRUBIN TOTAL: 0.27 mg/dL (ref 0.20–1.20)
BUN: 20.1 mg/dL (ref 7.0–26.0)
CHLORIDE: 108 meq/L (ref 98–109)
CO2: 22 meq/L (ref 22–29)
Calcium: 9.4 mg/dL (ref 8.4–10.4)
Creatinine: 0.8 mg/dL (ref 0.6–1.1)
EGFR: 79 mL/min/{1.73_m2} — AB (ref >90)
GLUCOSE: 148 mg/dL — AB (ref 70–140)
Potassium: 4.1 mEq/L (ref 3.5–5.1)
SODIUM: 140 meq/L (ref 136–145)
TOTAL PROTEIN: 7.1 g/dL (ref 6.4–8.3)

## 2014-09-03 MED ORDER — SODIUM CHLORIDE 0.9 % IV SOLN
Freq: Once | INTRAVENOUS | Status: AC
  Administered 2014-09-03: 14:00:00 via INTRAVENOUS
  Filled 2014-09-03: qty 4

## 2014-09-03 MED ORDER — SODIUM CHLORIDE 0.9 % IJ SOLN
10.0000 mL | INTRAMUSCULAR | Status: DC | PRN
  Administered 2014-09-03: 10 mL
  Filled 2014-09-03: qty 10

## 2014-09-03 MED ORDER — SODIUM CHLORIDE 0.9 % IV SOLN
840.0000 mg | Freq: Once | INTRAVENOUS | Status: AC
  Administered 2014-09-03: 840 mg via INTRAVENOUS
  Filled 2014-09-03: qty 28

## 2014-09-03 MED ORDER — DIPHENHYDRAMINE HCL 25 MG PO CAPS
ORAL_CAPSULE | ORAL | Status: AC
  Filled 2014-09-03: qty 2

## 2014-09-03 MED ORDER — FAMOTIDINE IN NACL 20-0.9 MG/50ML-% IV SOLN
INTRAVENOUS | Status: AC
  Filled 2014-09-03: qty 50

## 2014-09-03 MED ORDER — HEPARIN SOD (PORK) LOCK FLUSH 100 UNIT/ML IV SOLN
500.0000 [IU] | Freq: Once | INTRAVENOUS | Status: AC | PRN
  Administered 2014-09-03: 500 [IU]
  Filled 2014-09-03: qty 5

## 2014-09-03 MED ORDER — DIPHENHYDRAMINE HCL 25 MG PO CAPS
50.0000 mg | ORAL_CAPSULE | Freq: Once | ORAL | Status: AC
  Administered 2014-09-03: 50 mg via ORAL

## 2014-09-03 MED ORDER — SODIUM CHLORIDE 0.9 % IV SOLN
Freq: Once | INTRAVENOUS | Status: AC
  Administered 2014-09-03: 09:00:00 via INTRAVENOUS

## 2014-09-03 MED ORDER — FAMOTIDINE IN NACL 20-0.9 MG/50ML-% IV SOLN
20.0000 mg | Freq: Once | INTRAVENOUS | Status: AC
  Administered 2014-09-03: 20 mg via INTRAVENOUS

## 2014-09-03 MED ORDER — DIPHENHYDRAMINE HCL 50 MG/ML IJ SOLN
25.0000 mg | Freq: Once | INTRAMUSCULAR | Status: AC | PRN
  Administered 2014-09-03: 25 mg via INTRAVENOUS

## 2014-09-03 MED ORDER — PACLITAXEL CHEMO INJECTION 300 MG/50ML
80.0000 mg/m2 | Freq: Once | INTRAVENOUS | Status: DC
  Filled 2014-09-03: qty 29

## 2014-09-03 MED ORDER — TRASTUZUMAB CHEMO INJECTION 440 MG
8.0000 mg/kg | Freq: Once | INTRAVENOUS | Status: AC
  Administered 2014-09-03: 840 mg via INTRAVENOUS
  Filled 2014-09-03: qty 40

## 2014-09-03 NOTE — Progress Notes (Signed)
1545Alvy Bimler to MD chairside. 02 sat 90 %. Applied 2L O2. 30 min observation. 1615: Patient sat 96 %. MD Alvy Bimler notified. Patient discharged. No Paclitaxel given due to perjeta reaction and hypoxia. Will be postponed until 09/10/14.

## 2014-09-03 NOTE — Patient Instructions (Signed)
[facility] Discharge Instructions for Patients Receiving Chemotherapy  Today you received the following chemotherapy agents paclitaxel/Herceptin/Perjeta.   To help prevent nausea and vomiting after your treatment, we encourage you to take your nausea medication as directed.    If you develop nausea and vomiting that is not controlled by your nausea medication, call the clinic.   BELOW ARE SYMPTOMS THAT SHOULD BE REPORTED IMMEDIATELY:  *FEVER GREATER THAN 100.5 F  *CHILLS WITH OR WITHOUT FEVER  NAUSEA AND VOMITING THAT IS NOT CONTROLLED WITH YOUR NAUSEA MEDICATION  *UNUSUAL SHORTNESS OF BREATH  *UNUSUAL BRUISING OR BLEEDING  TENDERNESS IN MOUTH AND THROAT WITH OR WITHOUT PRESENCE OF ULCERS  *URINARY PROBLEMS  *BOWEL PROBLEMS  UNUSUAL RASH Items with * indicate a potential emergency and should be followed up as soon as possible.  Feel free to call the clinic you have any questions or concerns. The clinic phone number is [phone].  Please show the [facility] at check-in to the Emergency Department and triage nurse.

## 2014-09-03 NOTE — Progress Notes (Signed)
Patient noted to have nausea and chills at completion of 1 hour post-perjeta monitoring period. NS infusing at time symptoms are reported.  1347 pepcid given IVPB 1350 Selena Lesser, NP at chairside  1351 benadryl 25 mg IVP per NP and hypersensitivity protocol. VSS elevated, NP aware.

## 2014-09-04 ENCOUNTER — Encounter: Payer: Self-pay | Admitting: Nurse Practitioner

## 2014-09-04 ENCOUNTER — Encounter: Payer: Self-pay | Admitting: *Deleted

## 2014-09-04 DIAGNOSIS — T7840XA Allergy, unspecified, initial encounter: Secondary | ICD-10-CM | POA: Insufficient documentation

## 2014-09-04 NOTE — Assessment & Plan Note (Signed)
Patient presented to the [facility] today to receive her first cycle of Taxol/Herceptin/Perjeta chemotherapy regimen.  Blood counts obtained just yesterday were fairly stable.  Patient completed both the Herceptin and the Perjeta infusions with no difficulty; but did experience a hypersensitivity reaction towards the end of the hour-long Perjeta observation.  Patient experienced some trace rigors, nausea with one episode of vomiting, hypertension, and decreased O2 sats.  Patient was placed on O2 at 2 L nasal cannula.  Confirm the patient did receive premedications of Benadryl 50 mg orally and also took Tylenol at home just before arriving to the [facility].  Patient was given Benadryl 25 mg in a premixed bag of dexamethasone 20 mg and Zofran 8 mg per hypersensitivity protocol to manage reaction symptoms.  All symptoms did eventually subside; and patient's vital signs returned to baseline.  Decision was made to hold initiation of the Taxol chemotherapy due to the late hour of the day.  Patient has plans to return on 09/10/2014 for labs, visit, and her next cycle of chemotherapy.

## 2014-09-04 NOTE — Progress Notes (Signed)
SYMPTOM MANAGEMENT CLINIC   HPI: Denise Morton 64 y.o. female diagnosed with breast cancer.  Presented to the [facility] today to initiate Taxol/Herceptin/Perjeta chemotherapy regimen.  Patient presented to the [facility] today to receive her first cycle of Taxol/Herceptin/Perjeta chemotherapy regimen.  Blood counts obtained just yesterday were fairly stable.  Patient completed both the Herceptin and the Perjeta infusions with no difficulty; but did experience a hypersensitivity reaction towards the end of the hour-long Perjeta observation.  Patient experienced some trace rigors, nausea with one episode of vomiting, hypertension, and decreased O2 sats.  Patient was placed on O2 at 2 L nasal cannula.  Confirmed the patient did receive premedications of Benadryl 50 mg orally and also took Tylenol at home just before arriving to the [facility].  Patient was given Benadryl 25 mg in a premixed bag of dexamethasone 20 mg and Zofran 8 mg per hypersensitivity protocol to manage reaction symptoms.  All symptoms did eventually subside; and patient's vital signs returned to baseline.  Decision was made to hold initiation of the Taxol chemotherapy due to the late hour of the day.  Patient has plans to return on 09/10/2014 for labs, visit, and her next cycle of chemotherapy.   HPI  ROS  Past Medical History  Diagnosis Date  . CRVO (central retinal vein occlusion)     Past Surgical History  Procedure Laterality Date  . Tonsillectomy    . Tubal ligation    . Tumor removal      benign- 2 on leg, 1 in finger    has Breast cancer of upper-outer quadrant of right female breast and Hypersensitivity reaction on her problem list.    is allergic to penicillin g and sulfa antibiotics.    Medication List       This list is accurate as of: 09/03/14 11:59 PM.  Always use your most recent med list.               acetaminophen 500 MG tablet  Commonly known as:  TYLENOL  Take  1,000 mg by mouth every 6 (six) hours as needed for moderate pain.     ALPRAZolam 0.25 MG tablet  Commonly known as:  XANAX  Take 0.25 mg by mouth daily as needed for anxiety.     carvedilol 6.25 MG tablet  Commonly known as:  COREG  Take 1 tablet (6.25 mg total) by mouth 2 (two) times daily with a meal.     lidocaine-prilocaine cream  Commonly known as:  EMLA  Apply to port 1-2 hours before procedure     multivitamin with minerals Tabs tablet  Take 1 tablet by mouth daily.     ondansetron 8 MG tablet  Commonly known as:  ZOFRAN  Take 1 tablet (8 mg total) by mouth 2 (two) times daily. Start the day after chemo for 2 days. Then take as needed for nausea or vomiting.     prochlorperazine 10 MG tablet  Commonly known as:  COMPAZINE  Take 1 tablet (10 mg total) by mouth every 6 (six) hours as needed (Nausea or vomiting).         PHYSICAL EXAMINATION  Oncology Vitals 09/03/2014 09/03/2014 09/03/2014 09/03/2014 09/03/2014 08/27/2014 08/25/2014  Height - - - - - 166 cm -  Weight - - - - - 104.599 kg -  Weight (lbs) - - - - - 230 lbs 10 oz -  BMI (kg/m2) - - - - - 37.79 kg/m2 -  Temp - 98.8 98.9  99.6 98.7 98.1 98.1  Pulse - 96 107 82 77 94 87  Resp - - - - 18 20 16   SpO2 93 97 97 - 97 96 95  BSA (m2) - - - - - 2.2 m2 -   BP Readings from Last 3 Encounters:  09/03/14 198/92  08/27/14 182/80  08/25/14 180/84    Physical Exam  Constitutional: She is oriented to person, place, and time and well-developed, well-nourished, and in no distress.  HENT:  Head: Normocephalic and atraumatic.  Mouth/Throat: Oropharynx is clear and moist.  Eyes: Conjunctivae and EOM are normal. Pupils are equal, round, and reactive to light. Right eye exhibits no discharge. Left eye exhibits no discharge. No scleral icterus.  Neck: Normal range of motion. Neck supple. No JVD present. No tracheal deviation present. No thyromegaly present.  Cardiovascular: Normal rate, regular rhythm, normal heart sounds and  intact distal pulses.   Pulmonary/Chest: Effort normal and breath sounds normal. No stridor. No respiratory distress. She has no wheezes. She has no rales. She exhibits no tenderness.  Abdominal: Soft. Bowel sounds are normal. She exhibits no distension and no mass. There is no tenderness. There is no rebound and no guarding.  Musculoskeletal: Normal range of motion. She exhibits no edema or tenderness.  Lymphadenopathy:    She has no cervical adenopathy.  Neurological: She is alert and oriented to person, place, and time. Gait normal.  Skin: Skin is warm and dry. No rash noted. No erythema. There is pallor.  Psychiatric: Affect normal.  Nursing note and vitals reviewed.   LABORATORY DATA:. Appointment on 09/03/2014  Component Date Value Ref Range Status  . WBC 09/03/2014 9.5  3.9 - 10.3 10e3/uL Final  . NEUT# 09/03/2014 7.2* 1.5 - 6.5 10e3/uL Final  . HGB 09/03/2014 12.3  11.6 - 15.9 g/dL Final  . HCT 09/03/2014 38.1  34.8 - 46.6 % Final  . Platelets 09/03/2014 311  145 - 400 10e3/uL Final  . MCV 09/03/2014 86.0  79.5 - 101.0 fL Final  . MCH 09/03/2014 27.8  25.1 - 34.0 pg Final  . MCHC 09/03/2014 32.4  31.5 - 36.0 g/dL Final  . RBC 09/03/2014 4.43  3.70 - 5.45 10e6/uL Final  . RDW 09/03/2014 15.1* 11.2 - 14.5 % Final  . lymph# 09/03/2014 1.4  0.9 - 3.3 10e3/uL Final  . MONO# 09/03/2014 0.7  0.1 - 0.9 10e3/uL Final  . Eosinophils Absolute 09/03/2014 0.1  0.0 - 0.5 10e3/uL Final  . Basophils Absolute 09/03/2014 0.1  0.0 - 0.1 10e3/uL Final  . NEUT% 09/03/2014 75.7  38.4 - 76.8 % Final  . LYMPH% 09/03/2014 15.1  14.0 - 49.7 % Final  . MONO% 09/03/2014 7.3  0.0 - 14.0 % Final  . EOS% 09/03/2014 1.0  0.0 - 7.0 % Final  . BASO% 09/03/2014 0.9  0.0 - 2.0 % Final  . Sodium 09/03/2014 140  136 - 145 mEq/L Final  . Potassium 09/03/2014 4.1  3.5 - 5.1 mEq/L Final  . Chloride 09/03/2014 108  98 - 109 mEq/L Final  . CO2 09/03/2014 22  22 - 29 mEq/L Final  . Glucose 09/03/2014 148* 70 - 140  mg/dl Final  . BUN 09/03/2014 20.1  7.0 - 26.0 mg/dL Final  . Creatinine 09/03/2014 0.8  0.6 - 1.1 mg/dL Final  . Total Bilirubin 09/03/2014 0.27  0.20 - 1.20 mg/dL Final  . Alkaline Phosphatase 09/03/2014 91  40 - 150 U/L Final  . AST 09/03/2014 40* 5 - 34 U/L  Final  . ALT 09/03/2014 23  0 - 55 U/L Final  . Total Protein 09/03/2014 7.1  6.4 - 8.3 g/dL Final  . Albumin 09/03/2014 3.0* 3.5 - 5.0 g/dL Final  . Calcium 09/03/2014 9.4  8.4 - 10.4 mg/dL Final  . Anion Gap 09/03/2014 10  3 - 11 mEq/L Final  . EGFR 09/03/2014 79* >90 ml/min/1.73 m2 Final   eGFR is calculated using the CKD-EPI Creatinine Equation (2009)     RADIOGRAPHIC STUDIES: No results found.  ASSESSMENT/PLAN:    Breast cancer of upper-outer quadrant of right female breast Patient presented to the [facility] today to receive her first cycle of Taxol/Herceptin/Perjeta chemotherapy regimen.  Blood counts obtained just just today were fairly stable.  Patient completed both the Herceptin.  The Perjeta infusions with no difficulty; but did experience a hypersensitivity reaction towards the end of the hour-long Perjeta observation.  Reaction symptoms were managed per hypersensitivity protocol.  However, decision was made to hold initiation of the Taxol chemotherapy due to the late hour of the day.  Patient has plans to return on 09/10/2014 for labs, visit, and her next cycle of chemotherapy.  Hypersensitivity reaction Patient presented to the [facility] today to receive her first cycle of Taxol/Herceptin/Perjeta chemotherapy regimen.  Blood counts obtained just yesterday were fairly stable.  Patient completed both the Herceptin and the Perjeta infusions with no difficulty; but did experience a hypersensitivity reaction towards the end of the hour-long Perjeta observation.  Patient experienced some trace rigors, nausea with one episode of vomiting, hypertension, and decreased O2 sats.  Patient was placed on O2 at 2 L  nasal cannula.  Confirm the patient did receive premedications of Benadryl 50 mg orally and also took Tylenol at home just before arriving to the [facility].  Patient was given Benadryl 25 mg in a premixed bag of dexamethasone 20 mg and Zofran 8 mg per hypersensitivity protocol to manage reaction symptoms.  All symptoms did eventually subside; and patient's vital signs returned to baseline.  Decision was made to hold initiation of the Taxol chemotherapy due to the late hour of the day.  Patient has plans to return on 09/10/2014 for labs, visit, and her next cycle of chemotherapy.  Patient stated understanding of all instructions; and was in agreement with this plan of care. The patient knows to call the clinic with any problems, questions or concerns.   Review/collaboration with Dr. Alvy Bimler (on call) for Dr. Lindi Adie regarding all aspects of patient's visit today.   Total time spent with patient was 40 minutes;  with greater than 75 percent of that time spent in face to face counseling regarding patient's symptoms,  and coordination of care and follow up.  Disclaimer: This note was dictated with voice recognition software. Similar sounding words can inadvertently be transcribed and may not be corrected upon review.   Drue Second, NP 09/04/2014

## 2014-09-04 NOTE — Assessment & Plan Note (Signed)
Patient presented to the [facility] today to receive her first cycle of Taxol/Herceptin/Perjeta chemotherapy regimen.  Blood counts obtained just just today were fairly stable.  Patient completed both the Herceptin.  The Perjeta infusions with no difficulty; but did experience a hypersensitivity reaction towards the end of the hour-long Perjeta observation.  Reaction symptoms were managed per hypersensitivity protocol.  However, decision was made to hold initiation of the Taxol chemotherapy due to the late hour of the day.  Patient has plans to return on 09/10/2014 for labs, visit, and her next cycle of chemotherapy.

## 2014-09-08 ENCOUNTER — Telehealth: Payer: Self-pay | Admitting: *Deleted

## 2014-09-08 NOTE — Telephone Encounter (Signed)
Late Note from 8/18.  Received call from patient with several complaints about her 1st experience with chemotherapy.  She states she was not informed she would be getting benadryl and she states " benadryl knocks me out".  After effects of the benadryl took place she states " I was very groggy and may not have understood everything was going on but when I started getting chills someone gave me a blanket and then another blanket.  After I started shaking the nurse came around and I told someone I pooped my pants and no one acknowledged me and so I said again I pooped my pants and again no one was listening"   She also stated no one helped her the restroom or helped her get cleaned up, "so I sat in my own shit all day".  When she was completed with chemotherapy she stated she asked for something to wrap around her as she walked out so others could not see her pants.  She states she was given a towel.  She states she is very disappointed in the way she was treated and may look elsewhere for treatment.  Offered support and emotional encouragement.  Informed her I would notify appropriate personnel of the situation.  She will see Dr. Lindi Adie 8/24.

## 2014-09-09 ENCOUNTER — Other Ambulatory Visit: Payer: Self-pay

## 2014-09-09 ENCOUNTER — Other Ambulatory Visit: Payer: Self-pay | Admitting: *Deleted

## 2014-09-09 DIAGNOSIS — C50411 Malignant neoplasm of upper-outer quadrant of right female breast: Secondary | ICD-10-CM

## 2014-09-09 MED ORDER — ACYCLOVIR 400 MG PO TABS: 400.0000 mg | ORAL_TABLET | Freq: Two times a day (BID) | ORAL | Status: DC

## 2014-09-09 NOTE — Telephone Encounter (Signed)
Received call from patient stating she went to her PCP this morning and she has some fever blisters on her lower lip.  Per Dr. Lindi Adie can call in acyclovir 400mg  BID x7 days.  Sent to pharmacy.

## 2014-09-10 ENCOUNTER — Other Ambulatory Visit (HOSPITAL_BASED_OUTPATIENT_CLINIC_OR_DEPARTMENT_OTHER): Payer: 59

## 2014-09-10 ENCOUNTER — Ambulatory Visit (HOSPITAL_BASED_OUTPATIENT_CLINIC_OR_DEPARTMENT_OTHER): Payer: 59

## 2014-09-10 ENCOUNTER — Encounter: Payer: Self-pay | Admitting: Hematology and Oncology

## 2014-09-10 ENCOUNTER — Ambulatory Visit (HOSPITAL_BASED_OUTPATIENT_CLINIC_OR_DEPARTMENT_OTHER): Payer: 59 | Admitting: Hematology and Oncology

## 2014-09-10 ENCOUNTER — Telehealth: Payer: Self-pay | Admitting: Hematology and Oncology

## 2014-09-10 VITALS — BP 166/75 | HR 64 | Temp 98.7°F | Resp 20

## 2014-09-10 VITALS — BP 201/94 | HR 76 | Temp 99.2°F | Resp 18 | Ht 65.5 in | Wt 227.3 lb

## 2014-09-10 DIAGNOSIS — C787 Secondary malignant neoplasm of liver and intrahepatic bile duct: Secondary | ICD-10-CM | POA: Diagnosis not present

## 2014-09-10 DIAGNOSIS — C778 Secondary and unspecified malignant neoplasm of lymph nodes of multiple regions: Secondary | ICD-10-CM | POA: Diagnosis not present

## 2014-09-10 DIAGNOSIS — C50411 Malignant neoplasm of upper-outer quadrant of right female breast: Secondary | ICD-10-CM | POA: Diagnosis not present

## 2014-09-10 DIAGNOSIS — Z5111 Encounter for antineoplastic chemotherapy: Secondary | ICD-10-CM

## 2014-09-10 DIAGNOSIS — C7800 Secondary malignant neoplasm of unspecified lung: Secondary | ICD-10-CM | POA: Diagnosis not present

## 2014-09-10 DIAGNOSIS — C7951 Secondary malignant neoplasm of bone: Secondary | ICD-10-CM

## 2014-09-10 LAB — COMPREHENSIVE METABOLIC PANEL (CC13)
ALBUMIN: 2.9 g/dL — AB (ref 3.5–5.0)
ALT: 21 U/L (ref 0–55)
AST: 22 U/L (ref 5–34)
Alkaline Phosphatase: 86 U/L (ref 40–150)
Anion Gap: 9 mEq/L (ref 3–11)
BILIRUBIN TOTAL: 0.32 mg/dL (ref 0.20–1.20)
BUN: 13.5 mg/dL (ref 7.0–26.0)
CO2: 23 meq/L (ref 22–29)
Calcium: 9.2 mg/dL (ref 8.4–10.4)
Chloride: 108 mEq/L (ref 98–109)
Creatinine: 0.7 mg/dL (ref 0.6–1.1)
EGFR: 85 mL/min/{1.73_m2} — AB (ref >90)
GLUCOSE: 147 mg/dL — AB (ref 70–140)
Potassium: 4.1 mEq/L (ref 3.5–5.1)
SODIUM: 140 meq/L (ref 136–145)
TOTAL PROTEIN: 6.9 g/dL (ref 6.4–8.3)

## 2014-09-10 LAB — CBC WITH DIFFERENTIAL/PLATELET
BASO%: 0.3 % (ref 0.0–2.0)
Basophils Absolute: 0 10*3/uL (ref 0.0–0.1)
EOS%: 1 % (ref 0.0–7.0)
Eosinophils Absolute: 0.1 10*3/uL (ref 0.0–0.5)
HCT: 38.2 % (ref 34.8–46.6)
HEMOGLOBIN: 12.2 g/dL (ref 11.6–15.9)
LYMPH%: 20.8 % (ref 14.0–49.7)
MCH: 28.2 pg (ref 25.1–34.0)
MCHC: 31.9 g/dL (ref 31.5–36.0)
MCV: 88.2 fL (ref 79.5–101.0)
MONO#: 0.6 10*3/uL (ref 0.1–0.9)
MONO%: 6.5 % (ref 0.0–14.0)
NEUT%: 71.4 % (ref 38.4–76.8)
NEUTROS ABS: 6.8 10*3/uL — AB (ref 1.5–6.5)
Platelets: 333 10*3/uL (ref 145–400)
RBC: 4.33 10*6/uL (ref 3.70–5.45)
RDW: 14.7 % — AB (ref 11.2–14.5)
WBC: 9.6 10*3/uL (ref 3.9–10.3)
lymph#: 2 10*3/uL (ref 0.9–3.3)

## 2014-09-10 MED ORDER — SODIUM CHLORIDE 0.9 % IV SOLN
Freq: Once | INTRAVENOUS | Status: AC
  Administered 2014-09-10: 13:00:00 via INTRAVENOUS

## 2014-09-10 MED ORDER — DIPHENHYDRAMINE HCL 50 MG/ML IJ SOLN
INTRAMUSCULAR | Status: AC
  Filled 2014-09-10: qty 1

## 2014-09-10 MED ORDER — PACLITAXEL CHEMO INJECTION 300 MG/50ML
80.0000 mg/m2 | Freq: Once | INTRAVENOUS | Status: AC
  Administered 2014-09-10: 174 mg via INTRAVENOUS
  Filled 2014-09-10: qty 29

## 2014-09-10 MED ORDER — HEPARIN SOD (PORK) LOCK FLUSH 100 UNIT/ML IV SOLN
500.0000 [IU] | Freq: Once | INTRAVENOUS | Status: DC | PRN
  Filled 2014-09-10: qty 5

## 2014-09-10 MED ORDER — FAMOTIDINE IN NACL 20-0.9 MG/50ML-% IV SOLN
INTRAVENOUS | Status: AC
  Filled 2014-09-10: qty 50

## 2014-09-10 MED ORDER — SODIUM CHLORIDE 0.9 % IV SOLN
Freq: Once | INTRAVENOUS | Status: AC
  Administered 2014-09-10: 14:00:00 via INTRAVENOUS
  Filled 2014-09-10: qty 4

## 2014-09-10 MED ORDER — DIPHENHYDRAMINE HCL 25 MG PO CAPS
ORAL_CAPSULE | ORAL | Status: AC
Start: 2014-09-10 — End: 2014-09-10
  Filled 2014-09-10: qty 1

## 2014-09-10 MED ORDER — SODIUM CHLORIDE 0.9 % IJ SOLN
10.0000 mL | INTRAMUSCULAR | Status: DC | PRN
  Filled 2014-09-10: qty 10

## 2014-09-10 MED ORDER — FAMOTIDINE IN NACL 20-0.9 MG/50ML-% IV SOLN
20.0000 mg | Freq: Once | INTRAVENOUS | Status: AC
  Administered 2014-09-10: 20 mg via INTRAVENOUS

## 2014-09-10 MED ORDER — DIPHENHYDRAMINE HCL 25 MG PO CAPS
25.0000 mg | ORAL_CAPSULE | Freq: Once | ORAL | Status: AC
  Administered 2014-09-10: 25 mg via ORAL

## 2014-09-10 NOTE — Assessment & Plan Note (Signed)
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 In addition to Right breast, hypermetabolic disease in Rt [address] LN, axilla and mediastinum, bulky disease in liver, Pulm Mets, Path fracture 9th rib  Treatment plan: Taxol weekly(To start 09/10/2014 )Herceptin Perjeta every 3 weeks(started 09/03/2014) Chemotoxicities: 1. Severe drowsiness related to Benadryl during infusion 2. Diarrhea 1 3. Mouth sores: Prescribed acyclovir with significant improvement already.  Blood counts were reviewed and they're adequate for treatment Monitoring closely for chemotherapy toxicities Return to clinic in 1 week for toxicity check and for week 2 of Taxol.

## 2014-09-10 NOTE — Telephone Encounter (Signed)
Gave patient avs report and appointments for August thru November.  °

## 2014-09-10 NOTE — Patient Instructions (Signed)
[facility] Discharge Instructions for Patients Receiving Chemotherapy  Today you received the following chemotherapy agents Taxol  To help prevent nausea and vomiting after your treatment, we encourage you to take your nausea medication  Zofran and Compazine as directed   If you develop nausea and vomiting that is not controlled by your nausea medication, call the clinic.   BELOW ARE SYMPTOMS THAT SHOULD BE REPORTED IMMEDIATELY:  *FEVER GREATER THAN 100.5 F  *CHILLS WITH OR WITHOUT FEVER  NAUSEA AND VOMITING THAT IS NOT CONTROLLED WITH YOUR NAUSEA MEDICATION  *UNUSUAL SHORTNESS OF BREATH  *UNUSUAL BRUISING OR BLEEDING  TENDERNESS IN MOUTH AND THROAT WITH OR WITHOUT PRESENCE OF ULCERS  *URINARY PROBLEMS  *BOWEL PROBLEMS  UNUSUAL RASH Items with * indicate a potential emergency and should be followed up as soon as possible.  Feel free to call the clinic you have any questions or concerns. The clinic phone number is [phone].  Please show the [facility] at check-in to the Emergency Department and triage nurse.

## 2014-09-10 NOTE — Progress Notes (Signed)
Patient Care Team: Seward Carol, MD as PCP - General (Internal Medicine)  DIAGNOSIS: No matching staging information was found for the patient.  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 In addition to Right breast, hypermetabolic disease in Rt [address] LN, axilla and mediastinum, bulky disease in liver, Pulm Mets, Path fracture 9th rib   09/03/2014 -  Chemotherapy Herceptin Perjeta started 09/03/2014, Taxol weekly added 09/10/2014    CHIEF COMPLIANT: Taxol week 1  INTERVAL HISTORY: Denise Morton is a 64 year old with above-mentioned history of right-sided breast cancer with metastatic disease involving the liver, lung, bone, axilla, mediastinum. We prescribed her Abraxane Herceptin Perjeta however insurance denied Abraxane. She received Herceptin Perjeta on 09/03/2014 but could not receive Taxol as was changed based on her insurance company. She had profound chills.   REVIEW OF SYSTEMS:   Constitutional: Denies fevers, chills or abnormal weight loss Eyes: Denies blurriness of vision Ears, nose, mouth, throat, and face: Denies mucositis or sore throat Respiratory: Denies cough, dyspnea or wheezes Cardiovascular: Denies palpitation, chest discomfort or lower extremity swelling Gastrointestinal: Intermittent diarrhea Skin: Denies abnormal skin rashes Lymphatics: Denies new lymphadenopathy or easy bruising Neurological:Denies numbness, tingling or new weaknesses Behavioral/Psych: Mood is stable, no new changes  All other systems were reviewed with the patient and are negative.  I have reviewed the past medical history, past surgical history, social history and family history with the patient and they are  unchanged from previous note.  ALLERGIES:  is allergic to penicillin g and sulfa antibiotics.  MEDICATIONS:  Current Outpatient Prescriptions  Medication Sig Dispense Refill  . acetaminophen (TYLENOL) 500 MG tablet Take 1,000 mg by mouth every 6 (six) hours as needed for moderate pain.    Marland Kitchen acyclovir (ZOVIRAX) 400 MG tablet Take 1 tablet (400 mg total) by mouth 2 (two) times daily. 14 tablet 0  . ALPRAZolam (XANAX) 0.25 MG tablet Take 0.25 mg by mouth daily as needed for anxiety.    . carvedilol (COREG) 6.25 MG tablet Take 1 tablet (6.25 mg total) by mouth 2 (two) times daily with a meal. 60 tablet 3  . lidocaine-prilocaine (EMLA) cream Apply to port 1-2 hours before procedure 30 g 1  . Multiple Vitamin (MULTIVITAMIN WITH MINERALS) TABS tablet Take 1 tablet by mouth daily.    . ondansetron (ZOFRAN) 8 MG tablet Take 1 tablet (8 mg total) by mouth 2 (two) times daily. Start the day after chemo for 2 days. Then take as needed for nausea or vomiting. 30 tablet 1  . prochlorperazine (COMPAZINE) 10 MG tablet Take 1 tablet (10 mg total) by mouth every 6 (six) hours as needed (Nausea or vomiting). 30 tablet 1   No current facility-administered medications for this visit.   Facility-Administered Medications Ordered in Other Visits  Medication Dose Route Frequency Provider Last Rate Last Dose  . heparin lock flush 100 unit/mL  500 Units Intracatheter Once PRN Nicholas Lose, MD      . PACLitaxel (TAXOL) 174 mg in dextrose 5 % 250 mL chemo infusion (</= 45m/m2)  80 mg/m2 (Treatment Plan Actual) Intravenous Once VNicholas Lose MD      . sodium chloride 0.9 % injection 10 mL  10 mL Intracatheter PRN VNicholas Lose MD  PHYSICAL EXAMINATION: ECOG PERFORMANCE STATUS: 1 - Symptomatic but completely ambulatory  Filed Vitals:   09/10/14 1151  BP: 201/94  Pulse:   Temp:   Resp:    Filed Weights   09/10/14 1150  Weight: 227 lb 4.8 oz (103.103 kg)    GENERAL:alert, no distress and  comfortable SKIN: skin color, texture, turgor are normal, no rashes or significant lesions EYES: normal, Conjunctiva are pink and non-injected, sclera clear OROPHARYNX:no exudate, no erythema and lips, buccal mucosa, and tongue normal  NECK: supple, thyroid normal size, non-tender, without nodularity LYMPH:  no palpable lymphadenopathy in the cervical, axillary or inguinal LUNGS: clear to auscultation and percussion with normal breathing effort HEART: regular rate & rhythm and no murmurs and no lower extremity edema ABDOMEN:abdomen soft, non-tender and normal bowel sounds Musculoskeletal:no cyanosis of digits and no clubbing  NEURO: alert & oriented x 3 with fluent speech, no focal motor/sensory deficits LABORATORY DATA:  I have reviewed the data as listed   Chemistry      Component Value Date/Time   NA 140 09/10/2014 1119   NA 140 12/03/2006 1854   K 4.1 09/10/2014 1119   K 3.8 12/03/2006 1854   CL 108 12/03/2006 1854   CO2 23 09/10/2014 1119   BUN 13.5 09/10/2014 1119   BUN 21 12/03/2006 1854   CREATININE 0.7 09/10/2014 1119   CREATININE 1.0 12/03/2006 1854      Component Value Date/Time   CALCIUM 9.2 09/10/2014 1119   ALKPHOS 86 09/10/2014 1119   AST 22 09/10/2014 1119   ALT 21 09/10/2014 1119   BILITOT 0.32 09/10/2014 1119       Lab Results  Component Value Date   WBC 9.6 09/10/2014   HGB 12.2 09/10/2014   HCT 38.2 09/10/2014   MCV 88.2 09/10/2014   PLT 333 09/10/2014   NEUTROABS 6.8* 09/10/2014   ASSESSMENT & PLAN:  Breast cancer of 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 In addition to Right breast, hypermetabolic disease in Rt [address] LN, axilla and mediastinum, bulky disease in liver, Pulm Mets, Path fracture 9th rib  Treatment  plan: Taxol weekly(To start 09/10/2014 )Herceptin Perjeta every 3 weeks(started 09/03/2014) Chemotoxicities: 1. Severe drowsiness related to Benadryl during infusion 2. Diarrhea 1 3. Mouth sores: Prescribed acyclovir with significant improvement already.  Blood counts were reviewed and they're adequate for treatment Monitoring closely for chemotherapy toxicities Return to clinic in 1 week for toxicity check and for week 2 of Taxol.  No orders of the defined types were placed in this encounter.   The patient has a good understanding of the overall plan. she agrees with it. she will call with any problems that may develop before the next visit here.   Gudena, Vinay K, MD      

## 2014-09-12 ENCOUNTER — Ambulatory Visit: Payer: 59 | Admitting: Hematology and Oncology

## 2014-09-15 ENCOUNTER — Ambulatory Visit (HOSPITAL_COMMUNITY)
Admission: RE | Admit: 2014-09-15 | Discharge: 2014-09-15 | Disposition: A | Discharge status: Home / Self Care | Payer: 59 | Source: Ambulatory Visit | Attending: Cardiology | Admitting: Cardiology

## 2014-09-15 ENCOUNTER — Encounter (HOSPITAL_COMMUNITY): Payer: Self-pay

## 2014-09-15 VITALS — BP 170/96 | HR 76 | Ht 65.5 in | Wt 231.4 lb

## 2014-09-15 DIAGNOSIS — Z79899 Other long term (current) drug therapy: Secondary | ICD-10-CM | POA: Diagnosis not present

## 2014-09-15 DIAGNOSIS — C7800 Secondary malignant neoplasm of unspecified lung: Secondary | ICD-10-CM | POA: Insufficient documentation

## 2014-09-15 DIAGNOSIS — C781 Secondary malignant neoplasm of mediastinum: Secondary | ICD-10-CM | POA: Diagnosis not present

## 2014-09-15 DIAGNOSIS — C7951 Secondary malignant neoplasm of bone: Secondary | ICD-10-CM | POA: Diagnosis not present

## 2014-09-15 DIAGNOSIS — Z823 Family history of stroke: Secondary | ICD-10-CM | POA: Diagnosis not present

## 2014-09-15 DIAGNOSIS — Z87891 Personal history of nicotine dependence: Secondary | ICD-10-CM | POA: Insufficient documentation

## 2014-09-15 DIAGNOSIS — C787 Secondary malignant neoplasm of liver and intrahepatic bile duct: Secondary | ICD-10-CM | POA: Diagnosis not present

## 2014-09-15 DIAGNOSIS — C50411 Malignant neoplasm of upper-outer quadrant of right female breast: Secondary | ICD-10-CM | POA: Insufficient documentation

## 2014-09-15 DIAGNOSIS — I10 Essential (primary) hypertension: Secondary | ICD-10-CM | POA: Diagnosis not present

## 2014-09-15 MED ORDER — SPIRONOLACTONE 25 MG PO TABS: 12.5000 mg | ORAL_TABLET | Freq: Every day | ORAL | Status: DC

## 2014-09-15 NOTE — Patient Instructions (Signed)
START Spironolactone 12.5 mg (1/2 tablet) once daily.  Follow up 3 months with echocardiogram.  Do the following things EVERYDAY: 1) Weigh yourself in the morning before breakfast. Write it down and keep it in a log. 2) Take your medicines as prescribed 3) Eat low salt foods-Limit salt (sodium) to 2000 mg per day.  4) Stay as active as you can everyday 5) Limit all fluids for the day to less than 2 liters

## 2014-09-15 NOTE — Progress Notes (Signed)
Advanced Heart Failure Medication Review by a Pharmacist  Does the patient  feel that his/her medications are working for him/her?  yes  Has the patient been experiencing any side effects to the medications prescribed?  no  Does the patient measure his/her own blood pressure or blood glucose at home?  yes   Does the patient have any problems obtaining medications due to transportation or finances?   no  Understanding of regimen: excellent Understanding of indications: excellent Potential of compliance: excellent    Pharmacist comments:  Denise Morton is a pleasant 64 yo F presenting with an up-to-date medication list. She has an excellent understanding of her medications and reports excellent compliance. She did state that her BP remains high even after initiation of carvedilol. She did not have any particular medication-related questions or concerns today.   Ruta Hinds. Velva Harman, PharmD, BCPS, CPP Clinical Pharmacist Pager: [phone] Phone: [phone] 09/15/2014 12:00 PM

## 2014-09-15 NOTE — Progress Notes (Signed)
Patient ID: Denise Morton, female   DOB: 03-02-1950, 64 y.o.   MRN: [id number]   PCP: Dr Delfina Redwood Oncologist: Dr Jana Hakim  HPI: Denise Morton is a 64 year old with a history of right-sided breast cancer with metastatic disease involving the liver, lung, bone, axilla, mediastinum, HTN, and former smoker (quit 1 month ago) with no cardiac problems. She is referred to the cardio-onc clinic by Dr Lindi Adie.  About 3 weeks ago she was placed carvedilol for HTN. In the past she did take antihypertensive med but she is ot which one. Overall feels ok. Denies SOB/orthopnea/PND. Weight at home 231 pounds.   FH: Dad at CVA and CHF  SH: Former smoker quit smoking weeks ago. Lives alone. Retired Optometrist.    08/15/2014 Initial Diagnosis Right Breast Inflammatory invasive ductal cancer: 17 cm by ultrasound along with 13 cm ulceration, node also positive T4N1 (stage 3C) ER 0%, PR 0%, Her-2 Pending    08/22/2014 PET scan Metastatic disease. In addition to Right breast, hypermetabolic disease in Rt [address], axilla and mediastinum, bulky disease in liver, Pulm Mets, Path fracture 9th rib   09/03/2014 -  Chemotherapy Herceptin Perjeta started 09/03/2014, Taxol weekly added 09/10/2014         ECHO: EF 55-60% Grade I DD    ROS: All systems negative except as listed in HPI, PMH and Problem List.  SH:  Social History   Social History  . Marital Status: Single    Spouse Name: N/A  . Number of Children: N/A  . Years of Education: N/A   Occupational History  . Not on file.   Social History Main Topics  . Smoking status: Former Smoker -- 0.50 packs/day for 30 years  . Smokeless tobacco: Never Used  . Alcohol Use: No  . Drug Use: No  . Sexual Activity: No   Other Topics Concern  . Not on file   Social History Narrative    FH: History reviewed. No pertinent family history.  Past Medical History  Diagnosis Date  . CRVO (central retinal vein occlusion)     Current Outpatient  Prescriptions  Medication Sig Dispense Refill  . acetaminophen (TYLENOL) 500 MG tablet Take 1,000 mg by mouth every 6 (six) hours as needed for moderate pain.    Marland Kitchen acyclovir (ZOVIRAX) 400 MG tablet Take 1 tablet (400 mg total) by mouth 2 (two) times daily. (Patient taking differently: Take 400 mg by mouth 2 (two) times daily. Through 09/16/14) 14 tablet 0  . ALPRAZolam (XANAX) 0.25 MG tablet Take 0.25 mg by mouth daily as needed for anxiety.    . carvedilol (COREG) 6.25 MG tablet Take 1 tablet (6.25 mg total) by mouth 2 (two) times daily with a meal. 60 tablet 3  . lidocaine-prilocaine (EMLA) cream Apply to port 1-2 hours before procedure 30 g 1  . loperamide (IMODIUM) 2 MG capsule Take 2 mg by mouth as needed for diarrhea or loose stools.    . Multiple Vitamin (MULTIVITAMIN WITH MINERALS) TABS tablet Take 1 tablet by mouth daily.    . ondansetron (ZOFRAN) 8 MG tablet Take 1 tablet (8 mg total) by mouth 2 (two) times daily. Start the day after chemo for 2 days. Then take as needed for nausea or vomiting. 30 tablet 1  . prochlorperazine (COMPAZINE) 10 MG tablet Take 1 tablet (10 mg total) by mouth every 6 (six) hours as needed (Nausea or vomiting). 30 tablet 1   No current facility-administered medications for this encounter.    Filed Vitals:  09/15/14 1143  BP: 170/96  Pulse: 76  Height: 5' 5.5" (1.664 m)  Weight: 231 lb 6.4 oz (104.962 kg)  SpO2: 96%    PHYSICAL EXAM:  General:  Well appearing. No resp difficulty HEENT: normal Neck: supple. JVP flat. Carotids 2+ bilaterally; no bruits. No lymphadenopathy or thryomegaly appreciated. Cor: PMI normal. Regular rate & rhythm. No rubs, gallops or murmurs. Lungs: clear Abdomen: soft, nontender, nondistended. No hepatosplenomegaly. No bruits or masses. Good bowel sounds. Extremities: no cyanosis, clubbing, rash,  R and LLE trace edema.  Neuro: alert & orientedx3, cranial nerves grossly intact. Moves all 4 extremities w/o difficulty. Affect  pleasant.    ASSESSMENT & PLAN: 1. Breast Cancer: RUQ  Breast Cancer.  Diagnosed T4N1 (stage 3C) ER 0%, PR 0%, Her-2 Positive. Started Herceptin/perjeta on 09/03/2014 - Reviewed ECHO from 8/4 --> EF 55-60% . Plan to repeat ECHO every 3 months while getting Herceptin.  - Explained the purpose of the cardio-onc clinic and that there is ~10% chance of cardiotoxicity with use of this medication.    2. HTN: Continue current carvedilol. Add 12.5 mg spiro daily.  Check BMET in 10 days.   Follow up in 3 months with an ECHO  CLEGG,AMY NP-C  12:17 PM   Patient seen with NP, agree with the above echo.  I reviewed the recent echo.  It is a difficult study but EF appears normal.  I explained the purpose of the cardio-oncology clinic to her.  She will return in 3 months for repeat echo and office followup.   Loralie Champagne 09/16/2014

## 2014-09-17 ENCOUNTER — Ambulatory Visit (HOSPITAL_BASED_OUTPATIENT_CLINIC_OR_DEPARTMENT_OTHER): Payer: 59 | Admitting: Hematology and Oncology

## 2014-09-17 ENCOUNTER — Other Ambulatory Visit (HOSPITAL_BASED_OUTPATIENT_CLINIC_OR_DEPARTMENT_OTHER): Payer: 59

## 2014-09-17 ENCOUNTER — Encounter: Payer: Self-pay | Admitting: Hematology and Oncology

## 2014-09-17 ENCOUNTER — Ambulatory Visit (HOSPITAL_BASED_OUTPATIENT_CLINIC_OR_DEPARTMENT_OTHER): Payer: 59

## 2014-09-17 VITALS — BP 171/97 | HR 71 | Temp 98.5°F | Resp 18 | Ht 65.5 in | Wt 228.2 lb

## 2014-09-17 DIAGNOSIS — C7951 Secondary malignant neoplasm of bone: Secondary | ICD-10-CM

## 2014-09-17 DIAGNOSIS — C50411 Malignant neoplasm of upper-outer quadrant of right female breast: Secondary | ICD-10-CM | POA: Diagnosis not present

## 2014-09-17 DIAGNOSIS — C787 Secondary malignant neoplasm of liver and intrahepatic bile duct: Secondary | ICD-10-CM

## 2014-09-17 DIAGNOSIS — C778 Secondary and unspecified malignant neoplasm of lymph nodes of multiple regions: Secondary | ICD-10-CM

## 2014-09-17 DIAGNOSIS — Z5111 Encounter for antineoplastic chemotherapy: Secondary | ICD-10-CM

## 2014-09-17 DIAGNOSIS — C773 Secondary and unspecified malignant neoplasm of axilla and upper limb lymph nodes: Secondary | ICD-10-CM

## 2014-09-17 DIAGNOSIS — Z171 Estrogen receptor negative status [ER-]: Secondary | ICD-10-CM

## 2014-09-17 DIAGNOSIS — C7800 Secondary malignant neoplasm of unspecified lung: Secondary | ICD-10-CM

## 2014-09-17 LAB — CBC WITH DIFFERENTIAL/PLATELET
BASO%: 1.2 % (ref 0.0–2.0)
BASOS ABS: 0.1 10*3/uL (ref 0.0–0.1)
EOS ABS: 0.1 10*3/uL (ref 0.0–0.5)
EOS%: 0.8 % (ref 0.0–7.0)
HEMATOCRIT: 39.4 % (ref 34.8–46.6)
HEMOGLOBIN: 12.8 g/dL (ref 11.6–15.9)
LYMPH#: 1.7 10*3/uL (ref 0.9–3.3)
LYMPH%: 19.1 % (ref 14.0–49.7)
MCH: 27.9 pg (ref 25.1–34.0)
MCHC: 32.4 g/dL (ref 31.5–36.0)
MCV: 86.1 fL (ref 79.5–101.0)
MONO#: 0.4 10*3/uL (ref 0.1–0.9)
MONO%: 4.5 % (ref 0.0–14.0)
NEUT%: 74.4 % (ref 38.4–76.8)
NEUTROS ABS: 6.5 10*3/uL (ref 1.5–6.5)
PLATELETS: 370 10*3/uL (ref 145–400)
RBC: 4.58 10*6/uL (ref 3.70–5.45)
RDW: 15 % — AB (ref 11.2–14.5)
WBC: 8.7 10*3/uL (ref 3.9–10.3)

## 2014-09-17 LAB — COMPREHENSIVE METABOLIC PANEL (CC13)
ALT: 21 U/L (ref 0–55)
AST: 19 U/L (ref 5–34)
Albumin: 3 g/dL — ABNORMAL LOW (ref 3.5–5.0)
Alkaline Phosphatase: 89 U/L (ref 40–150)
Anion Gap: 7 mEq/L (ref 3–11)
BUN: 21.7 mg/dL (ref 7.0–26.0)
CHLORIDE: 105 meq/L (ref 98–109)
CO2: 28 meq/L (ref 22–29)
CREATININE: 0.8 mg/dL (ref 0.6–1.1)
Calcium: 10.2 mg/dL (ref 8.4–10.4)
EGFR: 79 mL/min/{1.73_m2} — ABNORMAL LOW (ref >90)
GLUCOSE: 146 mg/dL — AB (ref 70–140)
Potassium: 4.5 mEq/L (ref 3.5–5.1)
SODIUM: 140 meq/L (ref 136–145)
Total Bilirubin: 0.31 mg/dL (ref 0.20–1.20)
Total Protein: 7 g/dL (ref 6.4–8.3)

## 2014-09-17 MED ORDER — DIPHENHYDRAMINE HCL 25 MG PO CAPS
ORAL_CAPSULE | ORAL | Status: AC
Start: 2014-09-17 — End: 2014-09-17
  Filled 2014-09-17: qty 1

## 2014-09-17 MED ORDER — HEPARIN SOD (PORK) LOCK FLUSH 100 UNIT/ML IV SOLN
500.0000 [IU] | Freq: Once | INTRAVENOUS | Status: AC | PRN
  Administered 2014-09-17: 500 [IU]
  Filled 2014-09-17: qty 5

## 2014-09-17 MED ORDER — DIPHENHYDRAMINE HCL 25 MG PO CAPS
25.0000 mg | ORAL_CAPSULE | Freq: Once | ORAL | Status: AC
  Administered 2014-09-17: 25 mg via ORAL

## 2014-09-17 MED ORDER — PACLITAXEL CHEMO INJECTION 300 MG/50ML
80.0000 mg/m2 | Freq: Once | INTRAVENOUS | Status: AC
  Administered 2014-09-17: 174 mg via INTRAVENOUS
  Filled 2014-09-17: qty 29

## 2014-09-17 MED ORDER — SODIUM CHLORIDE 0.9 % IV SOLN
Freq: Once | INTRAVENOUS | Status: AC
  Administered 2014-09-17: 13:00:00 via INTRAVENOUS
  Filled 2014-09-17: qty 4

## 2014-09-17 MED ORDER — SODIUM CHLORIDE 0.9 % IJ SOLN
10.0000 mL | INTRAMUSCULAR | Status: DC | PRN
  Administered 2014-09-17: 10 mL
  Filled 2014-09-17: qty 10

## 2014-09-17 MED ORDER — FAMOTIDINE IN NACL 20-0.9 MG/50ML-% IV SOLN
INTRAVENOUS | Status: AC
Start: 2014-09-17 — End: 2014-09-17
  Filled 2014-09-17: qty 50

## 2014-09-17 MED ORDER — SODIUM CHLORIDE 0.9 % IV SOLN
Freq: Once | INTRAVENOUS | Status: AC
  Administered 2014-09-17: 13:00:00 via INTRAVENOUS

## 2014-09-17 MED ORDER — FAMOTIDINE IN NACL 20-0.9 MG/50ML-% IV SOLN
20.0000 mg | Freq: Once | INTRAVENOUS | Status: AC
  Administered 2014-09-17: 20 mg via INTRAVENOUS

## 2014-09-17 NOTE — Assessment & Plan Note (Signed)
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 In addition to Right breast, hypermetabolic disease in Rt [address] LN, axilla and mediastinum, bulky disease in liver, Pulm Mets, Path fracture 9th rib  Treatment plan: Taxol weekly(started 09/10/2014 )Herceptin Perjeta every 3 weeks(started 09/03/2014) Chemotoxicities: 1. Severe drowsiness related to Benadryl during infusion 2. Diarrhea 1 3. Mouth sores: Prescribed acyclovir: Resolved  Treatment plan change: I would like to change her therapy from weekly Taxol to once every 3 week Taxol. She will receive this new treatment plan starting next week concurrently with Herceptin and Perjeta  Blood counts were reviewed and they're adequate for treatment Monitoring closely for chemotherapy toxicities

## 2014-09-17 NOTE — Progress Notes (Signed)
Patient Care Team: Seward Carol, MD as PCP - General (Internal Medicine)  DIAGNOSIS: No matching staging information was found for the patient.  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 In addition to Right breast, hypermetabolic disease in Rt [address] LN, axilla and mediastinum, bulky disease in liver, Pulm Mets, Path fracture 9th rib   09/03/2014 -  Chemotherapy Herceptin Perjeta started 09/03/2014, Taxol weekly added 09/10/2014    CHIEF COMPLIANT: Taxol week 2  INTERVAL HISTORY: Denise Morton is a 64 year old lady with above-mentioned history right-sided breast cancer with metastatic disease involving extensively throughout the lymph nodes, liver and lung and bone area she is currently on palliative treatment with Taxol Herceptin and Perjeta. She is receiving weekly Taxol treatments. Today is week 2. After the first treatment with Herceptin and Perjeta, patient had profound confusion because of Benadryl that was given IV. With the last treatment with Taxol, we gave oral Benadryl 25 mg and this seems to have helped her. She did remarkably better with last week's treatment.  REVIEW OF SYSTEMS:   Constitutional: Denies fevers, chills or abnormal weight loss Eyes: Denies blurriness of vision Ears, nose, mouth, throat, and face: Denies mucositis or sore throat Respiratory: Denies cough, dyspnea or wheezes Cardiovascular: Denies palpitation, chest discomfort or lower extremity swelling Gastrointestinal:  Denies nausea, heartburn or change in bowel habits Skin: Denies abnormal skin rashes Lymphatics: Denies new lymphadenopathy or easy bruising Neurological:Denies numbness, tingling or new  weaknesses Behavioral/Psych: Mood is stable, no new changes  All other systems were reviewed with the patient and are negative.  I have reviewed the past medical history, past surgical history, social history and family history with the patient and they are unchanged from previous note.  ALLERGIES:  is allergic to penicillin g and sulfa antibiotics.  MEDICATIONS:  Current Outpatient Prescriptions  Medication Sig Dispense Refill  . acetaminophen (TYLENOL) 500 MG tablet Take 1,000 mg by mouth every 6 (six) hours as needed for moderate pain.    Marland Kitchen acyclovir (ZOVIRAX) 400 MG tablet Take 1 tablet (400 mg total) by mouth 2 (two) times daily. (Patient taking differently: Take 400 mg by mouth 2 (two) times daily. Through 09/16/14) 14 tablet 0  . ALPRAZolam (XANAX) 0.25 MG tablet Take 0.25 mg by mouth daily as needed for anxiety.    . carvedilol (COREG) 6.25 MG tablet Take 1 tablet (6.25 mg total) by mouth 2 (two) times daily with a meal. 60 tablet 3  . lidocaine-prilocaine (EMLA) cream Apply to port 1-2 hours before procedure 30 g 1  . loperamide (IMODIUM) 2 MG capsule Take 2 mg by mouth as needed for diarrhea or loose stools.    . Multiple Vitamin (MULTIVITAMIN WITH MINERALS) TABS tablet Take 1 tablet by mouth daily.    . ondansetron (ZOFRAN) 8 MG tablet Take 1 tablet (8 mg total) by mouth 2 (two) times daily. Start the day after chemo for 2 days. Then take as needed for nausea or vomiting. 30 tablet 1  . prochlorperazine (COMPAZINE) 10 MG tablet Take 1 tablet (10 mg total) by mouth every 6 (six) hours as needed (Nausea or vomiting). 30 tablet 1  . spironolactone (ALDACTONE) 25 MG tablet Take 0.5 tablets (12.5 mg total) by mouth daily. [place]  tablet 3   No current facility-administered medications for this visit.    PHYSICAL EXAMINATION: ECOG PERFORMANCE STATUS: 1 - Symptomatic but completely ambulatory  Filed Vitals:   09/17/14 1047  BP: 171/97  Pulse: 71  Temp: 98.5 F (36.9 C)  Resp: 18    Filed Weights   09/17/14 1047  Weight: 228 lb 3.2 oz (103.511 kg)    GENERAL:alert, no distress and comfortable SKIN: skin color, texture, turgor are normal, no rashes or significant lesions EYES: normal, Conjunctiva are pink and non-injected, sclera clear OROPHARYNX:no exudate, no erythema and lips, buccal mucosa, and tongue normal  NECK: supple, thyroid normal size, non-tender, without nodularity LYMPH:  no palpable lymphadenopathy in the cervical, axillary or inguinal LUNGS: clear to auscultation and percussion with normal breathing effort HEART: regular rate & rhythm and no murmurs and no lower extremity edema ABDOMEN:abdomen soft, non-tender and normal bowel sounds Musculoskeletal:no cyanosis of digits and no clubbing  NEURO: alert & oriented x 3 with fluent speech, no focal motor/sensory deficits  LABORATORY DATA:  I have reviewed the data as listed   Chemistry      Component Value Date/Time   NA 140 09/17/2014 1033   NA 140 12/03/2006 1854   K 4.5 09/17/2014 1033   K 3.8 12/03/2006 1854   CL 108 12/03/2006 1854   CO2 28 09/17/2014 1033   BUN 21.7 09/17/2014 1033   BUN 21 12/03/2006 1854   CREATININE 0.8 09/17/2014 1033   CREATININE 1.0 12/03/2006 1854      Component Value Date/Time   CALCIUM 10.2 09/17/2014 1033   ALKPHOS 89 09/17/2014 1033   AST 19 09/17/2014 1033   ALT 21 09/17/2014 1033   BILITOT 0.31 09/17/2014 1033       Lab Results  Component Value Date   WBC 8.7 09/17/2014   HGB 12.8 09/17/2014   HCT 39.4 09/17/2014   MCV 86.1 09/17/2014   PLT 370 09/17/2014   NEUTROABS 6.5 09/17/2014   ASSESSMENT & PLAN:  Breast cancer of 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 In addition to Right breast, hypermetabolic disease in Rt  [address] LN, axilla and mediastinum, bulky disease in liver, Pulm Mets, Path fracture 9th rib  Treatment plan: Taxol weekly(started 09/10/2014 )Herceptin Perjeta every 3 weeks(started 09/03/2014) We will continue with weekly Taxol treatments along with every three-week Herceptin Perjeta. After 3 cycles, we will obtain a CT scan and determine further treatment course.   Chemotoxicities: 1. Severe drowsiness related to Benadryl during infusion: Discontinued IV Benadryl, now takes 25 mg by mouth Benadryl 2. Diarrhea 1 3. Mouth sores: Prescribed acyclovir: Resolved  Blood counts were reviewed and they're adequate for treatment Monitoring closely for chemotherapy toxicities  Return to clinic in 2 weeks for follow-up No orders of the defined types were placed in this encounter.   The patient has a good understanding of the overall plan. she agrees with it. she will call with any problems that may develop before the next visit here.   Rulon Eisenmenger, MD

## 2014-09-17 NOTE — Patient Instructions (Addendum)
[facility] Discharge Instructions for Patients Receiving Chemotherapy  Today you received the following chemotherapy agents taxol  To help prevent nausea and vomiting after your treatment, we encourage you to take your nausea medication    If you develop nausea and vomiting that is not controlled by your nausea medication, call the clinic.   BELOW ARE SYMPTOMS THAT SHOULD BE REPORTED IMMEDIATELY:  *FEVER GREATER THAN 100.5 F  *CHILLS WITH OR WITHOUT FEVER  NAUSEA AND VOMITING THAT IS NOT CONTROLLED WITH YOUR NAUSEA MEDICATION  *UNUSUAL SHORTNESS OF BREATH  *UNUSUAL BRUISING OR BLEEDING  TENDERNESS IN MOUTH AND THROAT WITH OR WITHOUT PRESENCE OF ULCERS  *URINARY PROBLEMS  *BOWEL PROBLEMS  UNUSUAL RASH Items with * indicate a potential emergency and should be followed up as soon as possible.  Feel free to call the clinic you have any questions or concerns. The clinic phone number is [phone].  Please show the CHEMO ALERT CARD at check-in to the Emergency Department and triage nurse.   

## 2014-09-23 ENCOUNTER — Ambulatory Visit: Payer: 59 | Admitting: Nutrition

## 2014-09-23 NOTE — Progress Notes (Signed)
64 year old female diagnosed with metastatic breast cancer.  She is a patient of Dr. Lindi Adie.  Past medical history is not significant.  Medications include Xanax, multivitamin, Zofran, and Compazine.  Labs include glucose of 147, albumin 2.9.  Height: 65.5 inches. Weight: 228.2 pounds. Usual body weight: 230 pounds. BMI: 37.38.  Patient requests nutrition appointment to determine how much protein she should be eating daily.  She also is concerned because her blood sugars have been elevated. Patient reports she typically eats 3 meals daily and will consume a smoothie with Almond milk and protein powder or a bagel with peanut butter and yogurt for Breakfast.   For lunch she typically eats a sandwich or soup and at dinner she eats spaghetti with meat sauce or sausage with biscuits.  Patient drinks water decaffeinated iced tea. Patient denies nutrition impact symptoms from chemotherapy.  Nutrition diagnosis: Food and nutrition related knowledge deficit related to metastatic breast cancer as evidenced by no prior need for nutrition related information.  Intervention:  Patient was educated to continue 3 meals daily and educated on high-protein foods. Provided patient with a list of high protein foods. Encouraged patient to add one high protein snack daily along with approximately 30 g protein at each meal. Suspect low albumin is a reflection of inflammatory process, not of dietary protein intake. Educated patient on decreasing concentrated sweets to improve blood sugar control. Provided fact sheets on blood glucose management.   Questions were answered.  Teach back method used.  Contact information was provided.  Monitoring, evaluation, goals: Patient will continue high-protein diet which is low in concentrated sweets.  Next visit: No follow-up scheduled at this time.  Patient has my contact information for questions or concerns.  **Disclaimer: This note was dictated with voice recognition  software. Similar sounding words can inadvertently be transcribed and this note may contain transcription errors which may not have been corrected upon publication of note.**

## 2014-09-24 ENCOUNTER — Ambulatory Visit (HOSPITAL_BASED_OUTPATIENT_CLINIC_OR_DEPARTMENT_OTHER): Payer: 59

## 2014-09-24 ENCOUNTER — Other Ambulatory Visit (HOSPITAL_BASED_OUTPATIENT_CLINIC_OR_DEPARTMENT_OTHER): Payer: 59

## 2014-09-24 VITALS — BP 153/99 | HR 95 | Temp 98.2°F | Resp 18

## 2014-09-24 DIAGNOSIS — Z5112 Encounter for antineoplastic immunotherapy: Secondary | ICD-10-CM | POA: Diagnosis not present

## 2014-09-24 DIAGNOSIS — C50411 Malignant neoplasm of upper-outer quadrant of right female breast: Secondary | ICD-10-CM | POA: Diagnosis not present

## 2014-09-24 DIAGNOSIS — Z5111 Encounter for antineoplastic chemotherapy: Secondary | ICD-10-CM | POA: Diagnosis not present

## 2014-09-24 LAB — CBC WITH DIFFERENTIAL/PLATELET
BASO%: 1.2 % (ref 0.0–2.0)
Basophils Absolute: 0.1 10e3/uL (ref 0.0–0.1)
EOS%: 0.8 % (ref 0.0–7.0)
Eosinophils Absolute: 0 10e3/uL (ref 0.0–0.5)
HCT: 38.4 % (ref 34.8–46.6)
HGB: 12.4 g/dL (ref 11.6–15.9)
LYMPH%: 23.2 % (ref 14.0–49.7)
MCH: 27.8 pg (ref 25.1–34.0)
MCHC: 32.2 g/dL (ref 31.5–36.0)
MCV: 86.2 fL (ref 79.5–101.0)
MONO#: 0.3 10e3/uL (ref 0.1–0.9)
MONO%: 5.1 % (ref 0.0–14.0)
NEUT#: 4 10e3/uL (ref 1.5–6.5)
NEUT%: 69.7 % (ref 38.4–76.8)
Platelets: 312 10e3/uL (ref 145–400)
RBC: 4.45 10e6/uL (ref 3.70–5.45)
RDW: 16.1 % — ABNORMAL HIGH (ref 11.2–14.5)
WBC: 5.8 10e3/uL (ref 3.9–10.3)
lymph#: 1.3 10e3/uL (ref 0.9–3.3)

## 2014-09-24 LAB — COMPREHENSIVE METABOLIC PANEL (CC13)
ALT: 21 U/L (ref 0–55)
AST: 19 U/L (ref 5–34)
Albumin: 2.8 g/dL — ABNORMAL LOW (ref 3.5–5.0)
Alkaline Phosphatase: 83 U/L (ref 40–150)
Anion Gap: 7 mEq/L (ref 3–11)
BUN: 19.4 mg/dL (ref 7.0–26.0)
CALCIUM: 9.7 mg/dL (ref 8.4–10.4)
CHLORIDE: 107 meq/L (ref 98–109)
CO2: 24 meq/L (ref 22–29)
CREATININE: 0.8 mg/dL (ref 0.6–1.1)
EGFR: 77 mL/min/{1.73_m2} — ABNORMAL LOW (ref >90)
GLUCOSE: 231 mg/dL — AB (ref 70–140)
Potassium: 4.7 mEq/L (ref 3.5–5.1)
SODIUM: 138 meq/L (ref 136–145)
Total Bilirubin: 0.26 mg/dL (ref 0.20–1.20)
Total Protein: 6.5 g/dL (ref 6.4–8.3)

## 2014-09-24 MED ORDER — FAMOTIDINE IN NACL 20-0.9 MG/50ML-% IV SOLN
20.0000 mg | Freq: Once | INTRAVENOUS | Status: AC
  Administered 2014-09-24: 20 mg via INTRAVENOUS

## 2014-09-24 MED ORDER — DIPHENHYDRAMINE HCL 25 MG PO CAPS
ORAL_CAPSULE | ORAL | Status: AC
Start: 2014-09-24 — End: 2014-09-24
  Filled 2014-09-24: qty 1

## 2014-09-24 MED ORDER — DIPHENHYDRAMINE HCL 25 MG PO CAPS
25.0000 mg | ORAL_CAPSULE | Freq: Once | ORAL | Status: AC
  Administered 2014-09-24: 25 mg via ORAL

## 2014-09-24 MED ORDER — ACETAMINOPHEN 325 MG PO TABS
650.0000 mg | ORAL_TABLET | Freq: Once | ORAL | Status: AC
  Administered 2014-09-24: 650 mg via ORAL

## 2014-09-24 MED ORDER — TRASTUZUMAB CHEMO INJECTION 440 MG
6.0000 mg/kg | Freq: Once | INTRAVENOUS | Status: AC
  Administered 2014-09-24: 630 mg via INTRAVENOUS
  Filled 2014-09-24: qty 30

## 2014-09-24 MED ORDER — SODIUM CHLORIDE 0.9 % IV SOLN
420.0000 mg | Freq: Once | INTRAVENOUS | Status: AC
  Administered 2014-09-24: 420 mg via INTRAVENOUS
  Filled 2014-09-24: qty 14

## 2014-09-24 MED ORDER — ACETAMINOPHEN 325 MG PO TABS
ORAL_TABLET | ORAL | Status: AC
Start: 2014-09-24 — End: 2014-09-24
  Filled 2014-09-24: qty 2

## 2014-09-24 MED ORDER — SODIUM CHLORIDE 0.9 % IV SOLN
Freq: Once | INTRAVENOUS | Status: AC
  Administered 2014-09-24: 10:00:00 via INTRAVENOUS
  Filled 2014-09-24: qty 4

## 2014-09-24 MED ORDER — PACLITAXEL CHEMO INJECTION 300 MG/50ML
80.0000 mg/m2 | Freq: Once | INTRAVENOUS | Status: AC
  Administered 2014-09-24: 174 mg via INTRAVENOUS
  Filled 2014-09-24: qty 29

## 2014-09-24 MED ORDER — HEPARIN SOD (PORK) LOCK FLUSH 100 UNIT/ML IV SOLN
500.0000 [IU] | Freq: Once | INTRAVENOUS | Status: AC | PRN
  Administered 2014-09-24: 500 [IU]
  Filled 2014-09-24: qty 5

## 2014-09-24 MED ORDER — FAMOTIDINE IN NACL 20-0.9 MG/50ML-% IV SOLN
INTRAVENOUS | Status: AC
  Filled 2014-09-24: qty 50

## 2014-09-24 MED ORDER — SODIUM CHLORIDE 0.9 % IJ SOLN
10.0000 mL | INTRAMUSCULAR | Status: DC | PRN
  Administered 2014-09-24: 10 mL
  Filled 2014-09-24: qty 10

## 2014-09-24 MED ORDER — SODIUM CHLORIDE 0.9 % IV SOLN
Freq: Once | INTRAVENOUS | Status: AC
  Administered 2014-09-24: 10:00:00 via INTRAVENOUS

## 2014-09-24 NOTE — Patient Instructions (Signed)
[facility] Discharge Instructions for Patients Receiving Chemotherapy  Today you received the following chemotherapy agents: Taxol, Herceptin and Perjeta.  To help prevent nausea and vomiting after your treatment, we encourage you to take your nausea medication: Compazine. Take one every 6 hours as needed. You may also take Zofran every 8 hours as needed.   If you develop nausea and vomiting that is not controlled by your nausea medication, call the clinic.   BELOW ARE SYMPTOMS THAT SHOULD BE REPORTED IMMEDIATELY:  *FEVER GREATER THAN 100.5 F  *CHILLS WITH OR WITHOUT FEVER  NAUSEA AND VOMITING THAT IS NOT CONTROLLED WITH YOUR NAUSEA MEDICATION  *UNUSUAL SHORTNESS OF BREATH  *UNUSUAL BRUISING OR BLEEDING  TENDERNESS IN MOUTH AND THROAT WITH OR WITHOUT PRESENCE OF ULCERS  *URINARY PROBLEMS  *BOWEL PROBLEMS  UNUSUAL RASH Items with * indicate a potential emergency and should be followed up as soon as possible.  Feel free to call the clinic should you have any questions or concerns. The clinic phone number is [phone].  Please show the [facility] at check-in to the Emergency Department and triage nurse.

## 2014-09-25 ENCOUNTER — Ambulatory Visit (HOSPITAL_COMMUNITY): Payer: 59

## 2014-09-26 NOTE — Progress Notes (Signed)
Late entry for 09/24/14: Approximately 5 minutes into Herceptin infusion pt reported her shirt was wet. Cap on Lynden needle set noted to be off. Approximate 5 inch wet stain on pt's sweater. Lines clamped, changed and port de-accessed due to possible contamination. Pt had been asleep in recliner prior to being awakened by the fluids.  Port re-accessed, infusion resumed. Pt's clothing bagged and sent home with instructions to wash separately from other clothing with hot water. She voiced understanding, was in no distress. Port site unremarkable at discharge.

## 2014-10-01 ENCOUNTER — Telehealth: Payer: Self-pay | Admitting: Hematology and Oncology

## 2014-10-01 ENCOUNTER — Encounter: Payer: Self-pay | Admitting: Hematology and Oncology

## 2014-10-01 ENCOUNTER — Other Ambulatory Visit (HOSPITAL_BASED_OUTPATIENT_CLINIC_OR_DEPARTMENT_OTHER): Payer: 59

## 2014-10-01 ENCOUNTER — Ambulatory Visit (HOSPITAL_BASED_OUTPATIENT_CLINIC_OR_DEPARTMENT_OTHER): Payer: 59 | Admitting: Hematology and Oncology

## 2014-10-01 ENCOUNTER — Ambulatory Visit (HOSPITAL_BASED_OUTPATIENT_CLINIC_OR_DEPARTMENT_OTHER): Payer: 59

## 2014-10-01 VITALS — BP 151/88 | HR 92 | Temp 98.5°F | Resp 18 | Ht 65.5 in | Wt 229.9 lb

## 2014-10-01 DIAGNOSIS — C50411 Malignant neoplasm of upper-outer quadrant of right female breast: Secondary | ICD-10-CM | POA: Diagnosis not present

## 2014-10-01 DIAGNOSIS — Z5111 Encounter for antineoplastic chemotherapy: Secondary | ICD-10-CM | POA: Diagnosis not present

## 2014-10-01 DIAGNOSIS — C778 Secondary and unspecified malignant neoplasm of lymph nodes of multiple regions: Secondary | ICD-10-CM

## 2014-10-01 DIAGNOSIS — C787 Secondary malignant neoplasm of liver and intrahepatic bile duct: Secondary | ICD-10-CM

## 2014-10-01 DIAGNOSIS — R197 Diarrhea, unspecified: Secondary | ICD-10-CM

## 2014-10-01 DIAGNOSIS — C7800 Secondary malignant neoplasm of unspecified lung: Secondary | ICD-10-CM | POA: Diagnosis not present

## 2014-10-01 DIAGNOSIS — C7951 Secondary malignant neoplasm of bone: Secondary | ICD-10-CM

## 2014-10-01 DIAGNOSIS — Z171 Estrogen receptor negative status [ER-]: Secondary | ICD-10-CM

## 2014-10-01 LAB — COMPREHENSIVE METABOLIC PANEL (CC13)
ALT: 22 U/L (ref 0–55)
AST: 19 U/L (ref 5–34)
Albumin: 3.1 g/dL — ABNORMAL LOW (ref 3.5–5.0)
Alkaline Phosphatase: 89 U/L (ref 40–150)
Anion Gap: 8 mEq/L (ref 3–11)
BILIRUBIN TOTAL: 0.4 mg/dL (ref 0.20–1.20)
BUN: 22 mg/dL (ref 7.0–26.0)
CHLORIDE: 108 meq/L (ref 98–109)
CO2: 25 meq/L (ref 22–29)
CREATININE: 0.8 mg/dL (ref 0.6–1.1)
Calcium: 10 mg/dL (ref 8.4–10.4)
EGFR: 79 mL/min/{1.73_m2} — ABNORMAL LOW (ref >90)
GLUCOSE: 155 mg/dL — AB (ref 70–140)
Potassium: 4.3 mEq/L (ref 3.5–5.1)
Sodium: 140 mEq/L (ref 136–145)
TOTAL PROTEIN: 6.8 g/dL (ref 6.4–8.3)

## 2014-10-01 LAB — CBC WITH DIFFERENTIAL/PLATELET
BASO%: 0.9 % (ref 0.0–2.0)
Basophils Absolute: 0.1 10*3/uL (ref 0.0–0.1)
EOS%: 0.9 % (ref 0.0–7.0)
Eosinophils Absolute: 0.1 10*3/uL (ref 0.0–0.5)
HEMATOCRIT: 40.4 % (ref 34.8–46.6)
HGB: 13 g/dL (ref 11.6–15.9)
LYMPH#: 1.9 10*3/uL (ref 0.9–3.3)
LYMPH%: 34.9 % (ref 14.0–49.7)
MCH: 27.9 pg (ref 25.1–34.0)
MCHC: 32.2 g/dL (ref 31.5–36.0)
MCV: 86.8 fL (ref 79.5–101.0)
MONO#: 0.3 10*3/uL (ref 0.1–0.9)
MONO%: 6.2 % (ref 0.0–14.0)
NEUT%: 57.1 % (ref 38.4–76.8)
NEUTROS ABS: 3.2 10*3/uL (ref 1.5–6.5)
Platelets: 330 10*3/uL (ref 145–400)
RBC: 4.66 10*6/uL (ref 3.70–5.45)
RDW: 16.3 % — ABNORMAL HIGH (ref 11.2–14.5)
WBC: 5.6 10*3/uL (ref 3.9–10.3)

## 2014-10-01 LAB — TECHNOLOGIST REVIEW

## 2014-10-01 MED ORDER — SODIUM CHLORIDE 0.9 % IV SOLN
Freq: Once | INTRAVENOUS | Status: AC
  Administered 2014-10-01: 11:00:00 via INTRAVENOUS
  Filled 2014-10-01: qty 4

## 2014-10-01 MED ORDER — SODIUM CHLORIDE 0.9 % IV SOLN
Freq: Once | INTRAVENOUS | Status: AC
  Administered 2014-10-01: 10:00:00 via INTRAVENOUS

## 2014-10-01 MED ORDER — FAMOTIDINE IN NACL 20-0.9 MG/50ML-% IV SOLN
20.0000 mg | Freq: Once | INTRAVENOUS | Status: AC
  Administered 2014-10-01: 20 mg via INTRAVENOUS

## 2014-10-01 MED ORDER — DEXTROSE 5 % IV SOLN
80.0000 mg/m2 | Freq: Once | INTRAVENOUS | Status: AC
  Administered 2014-10-01: 174 mg via INTRAVENOUS
  Filled 2014-10-01: qty 29

## 2014-10-01 MED ORDER — DIPHENHYDRAMINE HCL 25 MG PO CAPS
ORAL_CAPSULE | ORAL | Status: AC
  Filled 2014-10-01: qty 1

## 2014-10-01 MED ORDER — DIPHENHYDRAMINE HCL 25 MG PO CAPS
25.0000 mg | ORAL_CAPSULE | Freq: Once | ORAL | Status: AC
  Administered 2014-10-01: 25 mg via ORAL

## 2014-10-01 MED ORDER — FAMOTIDINE IN NACL 20-0.9 MG/50ML-% IV SOLN
INTRAVENOUS | Status: AC
  Filled 2014-10-01: qty 50

## 2014-10-01 MED ORDER — DIPHENHYDRAMINE HCL 50 MG/ML IJ SOLN
INTRAMUSCULAR | Status: AC
  Filled 2014-10-01: qty 1

## 2014-10-01 MED ORDER — HEPARIN SOD (PORK) LOCK FLUSH 100 UNIT/ML IV SOLN
500.0000 [IU] | Freq: Once | INTRAVENOUS | Status: AC | PRN
  Administered 2014-10-01: 500 [IU]
  Filled 2014-10-01: qty 5

## 2014-10-01 MED ORDER — SODIUM CHLORIDE 0.9 % IJ SOLN
10.0000 mL | INTRAMUSCULAR | Status: DC | PRN
  Administered 2014-10-01: 10 mL
  Filled 2014-10-01: qty 10

## 2014-10-01 NOTE — Progress Notes (Signed)
Patient Care Team: Seward Carol, MD as PCP - General (Internal Medicine)  DIAGNOSIS: No matching staging information was found for the patient.  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 In addition to Right breast, hypermetabolic disease in Rt [address] LN, axilla and mediastinum, bulky disease in liver, Pulm Mets, Path fracture 9th rib   09/03/2014 -  Chemotherapy Herceptin Perjeta started 09/03/2014, Taxol weekly added 09/10/2014    CHIEF COMPLIANT: Taxol week 5  INTERVAL HISTORY: Denise Morton is a cystic 64-year-old with above-mentioned history of metastatic right breast cancer who is currently on palliative chemotherapy with Taxol Herceptin Perjeta. She is currently receiving weekly Taxol treatments. Today is week 5. Last week she received both Herceptin Perjeta as well. Since we reduced the dosage of Benadryl she is doing much better with the Taxol. She did not have any diarrhea with this current round of chemotherapy and Herceptin Perjeta. She denies any neuropathy.  REVIEW OF SYSTEMS:   Constitutional: Denies fevers, chills or abnormal weight loss Eyes: Denies blurriness of vision Ears, nose, mouth, throat, and face: Denies mucositis or sore throat Respiratory: Denies cough, dyspnea or wheezes Cardiovascular: Denies palpitation, chest discomfort or lower extremity swelling Gastrointestinal:  Denies nausea, heartburn or change in bowel habits Skin: Denies abnormal skin rashes Lymphatics: Denies new lymphadenopathy or easy bruising Neurological:Denies numbness, tingling or new weaknesses Behavioral/Psych: Mood is stable, no new changes  Breast: Marked improvement in inflammatory breast cancer All other  systems were reviewed with the patient and are negative.  I have reviewed the past medical history, past surgical history, social history and family history with the patient and they are unchanged from previous note.  ALLERGIES:  is allergic to penicillin g and sulfa antibiotics.  MEDICATIONS:  Current Outpatient Prescriptions  Medication Sig Dispense Refill  . acetaminophen (TYLENOL) 500 MG tablet Take 1,000 mg by mouth every 6 (six) hours as needed for moderate pain.    Marland Kitchen acyclovir (ZOVIRAX) 400 MG tablet Take 1 tablet (400 mg total) by mouth 2 (two) times daily. (Patient taking differently: Take 400 mg by mouth 2 (two) times daily. Through 09/16/14) 14 tablet 0  . ALPRAZolam (XANAX) 0.25 MG tablet Take 0.25 mg by mouth daily as needed for anxiety.    . carvedilol (COREG) 6.25 MG tablet Take 1 tablet (6.25 mg total) by mouth 2 (two) times daily with a meal. 60 tablet 3  . lidocaine-prilocaine (EMLA) cream Apply to port 1-2 hours before procedure 30 g 1  . loperamide (IMODIUM) 2 MG capsule Take 2 mg by mouth as needed for diarrhea or loose stools.    . Multiple Vitamin (MULTIVITAMIN WITH MINERALS) TABS tablet Take 1 tablet by mouth daily.    . ondansetron (ZOFRAN) 8 MG tablet Take 1 tablet (8 mg total) by mouth 2 (two) times daily. Start the day after chemo for 2 days. Then take as needed for nausea or vomiting. 30 tablet 1  . prochlorperazine (COMPAZINE) 10 MG tablet Take 1 tablet (10 mg total) by mouth every 6 (six) hours as needed (Nausea or vomiting). 30 tablet 1  . spironolactone (ALDACTONE) 25 MG tablet Take 0.5 tablets (12.5 mg total) by mouth daily. 45 tablet 3   No current facility-administered medications for  this visit.    PHYSICAL EXAMINATION: ECOG PERFORMANCE STATUS: 1 - Symptomatic but completely ambulatory  Filed Vitals:   10/01/14 0845  BP: 151/88  Pulse: 92  Temp: 98.5 F (36.9 C)  Resp: 18   Filed Weights   10/01/14 0845  Weight: 229 lb 14.4 oz (104.282 kg)     GENERAL:alert, no distress and comfortable SKIN: skin color, texture, turgor are normal, no rashes or significant lesions EYES: normal, Conjunctiva are pink and non-injected, sclera clear OROPHARYNX:no exudate, no erythema and lips, buccal mucosa, and tongue normal  NECK: supple, thyroid normal size, non-tender, without nodularity LYMPH:  no palpable lymphadenopathy in the cervical, axillary or inguinal LUNGS: clear to auscultation and percussion with normal breathing effort HEART: regular rate & rhythm and no murmurs and no lower extremity edema ABDOMEN:abdomen soft, non-tender and normal bowel sounds Musculoskeletal:no cyanosis of digits and no clubbing  NEURO: alert & oriented x 3 with fluent speech, no focal motor/sensory deficits  LABORATORY DATA:  I have reviewed the data as listed   Chemistry      Component Value Date/Time   NA 138 09/24/2014 0834   NA 140 12/03/2006 1854   K 4.7 09/24/2014 0834   K 3.8 12/03/2006 1854   CL 108 12/03/2006 1854   CO2 24 09/24/2014 0834   BUN 19.4 09/24/2014 0834   BUN 21 12/03/2006 1854   CREATININE 0.8 09/24/2014 0834   CREATININE 1.0 12/03/2006 1854      Component Value Date/Time   CALCIUM 9.7 09/24/2014 0834   ALKPHOS 83 09/24/2014 0834   AST 19 09/24/2014 0834   ALT 21 09/24/2014 0834   BILITOT 0.26 09/24/2014 0834       Lab Results  Component Value Date   WBC 5.6 10/01/2014   HGB 13.0 10/01/2014   HCT 40.4 10/01/2014   MCV 86.8 10/01/2014   PLT 330 10/01/2014   NEUTROABS 3.2 10/01/2014   ASSESSMENT & PLAN:  Breast cancer of 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 In addition to Right breast, hypermetabolic disease in Rt [address] LN, axilla and mediastinum, bulky disease in liver, Pulm Mets,  Path fracture 9th rib  Treatment plan: Taxol weekly(started 09/10/2014 )Herceptin Perjeta every 3 weeks(started 09/03/2014) We will continue with weekly Taxol treatments along with every three-week Herceptin Perjeta. After 3 cycles, we will obtain a CT scan and determine further treatment course.   Current treatment: Today is Week 5 Taxol, Herceptin Perjeta being given once every 3 weeks (8/17 and 9/7) Chemotoxicities: 1. Severe drowsiness related to Benadryl during infusion: Discontinued IV Benadryl, now takes 25 mg by mouth Benadryl 2. Diarrhea 1 3. Mouth sores: Prescribed acyclovir: Resolved  I encouraged her to stay more active. Blood counts were reviewed and they're adequate for treatment Monitoring closely for chemotherapy toxicities  Return to clinic in 2 weeks for follow-up with cycle 3 of Herceptin Perjeta   No orders of the defined types were placed in this encounter.   The patient has a good understanding of the overall plan. she agrees with it. she will call with any problems that may develop before the next visit here.   Rulon Eisenmenger, MD

## 2014-10-01 NOTE — Telephone Encounter (Signed)
Appointments made and avs printed for patient °

## 2014-10-01 NOTE — Assessment & Plan Note (Signed)
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 In addition to Right breast, hypermetabolic disease in Rt [address] LN, axilla and mediastinum, bulky disease in liver, Pulm Mets, Path fracture 9th rib  Treatment plan: Taxol weekly(started 09/10/2014 )Herceptin Perjeta every 3 weeks(started 09/03/2014) We will continue with weekly Taxol treatments along with every three-week Herceptin Perjeta. After 3 cycles, we will obtain a CT scan and determine further treatment course.   Current treatment: Today is Week 5 Taxol, Herceptin Perjeta being given once every 3 weeks (8/17 and 9/7) Chemotoxicities: 1. Severe drowsiness related to Benadryl during infusion: Discontinued IV Benadryl, now takes 25 mg by mouth Benadryl 2. Diarrhea 1 3. Mouth sores: Prescribed acyclovir: Resolved  Blood counts were reviewed and they're adequate for treatment Monitoring closely for chemotherapy toxicities  Return to clinic in 2 weeks for follow-up with cycle 3 of Herceptin Perjeta

## 2014-10-01 NOTE — Patient Instructions (Signed)
[facility] Discharge Instructions for Patients Receiving Chemotherapy  Today you received the following chemotherapy agents Taxol   To help prevent nausea and vomiting after your treatment, we encourage you to take your nausea medication as directed.   If you develop nausea and vomiting that is not controlled by your nausea medication, call the clinic.   BELOW ARE SYMPTOMS THAT SHOULD BE REPORTED IMMEDIATELY:  *FEVER GREATER THAN 100.5 F  *CHILLS WITH OR WITHOUT FEVER  NAUSEA AND VOMITING THAT IS NOT CONTROLLED WITH YOUR NAUSEA MEDICATION  *UNUSUAL SHORTNESS OF BREATH  *UNUSUAL BRUISING OR BLEEDING  TENDERNESS IN MOUTH AND THROAT WITH OR WITHOUT PRESENCE OF ULCERS  *URINARY PROBLEMS  *BOWEL PROBLEMS  UNUSUAL RASH Items with * indicate a potential emergency and should be followed up as soon as possible.  Feel free to call the clinic you have any questions or concerns. The clinic phone number is [phone].  Please show the CHEMO ALERT CARD at check-in to the Emergency Department and triage nurse.   

## 2014-10-01 NOTE — Addendum Note (Signed)
Addended by: Jonelle Sports K on: 10/01/2014 11:18 AM   Modules accepted: Medications

## 2014-10-08 ENCOUNTER — Encounter: Payer: Self-pay | Admitting: *Deleted

## 2014-10-08 ENCOUNTER — Other Ambulatory Visit: Payer: Self-pay | Admitting: *Deleted

## 2014-10-08 ENCOUNTER — Ambulatory Visit (HOSPITAL_BASED_OUTPATIENT_CLINIC_OR_DEPARTMENT_OTHER): Payer: 59

## 2014-10-08 ENCOUNTER — Other Ambulatory Visit (HOSPITAL_BASED_OUTPATIENT_CLINIC_OR_DEPARTMENT_OTHER): Payer: 59

## 2014-10-08 VITALS — BP 161/88 | HR 81 | Temp 98.6°F | Resp 18

## 2014-10-08 DIAGNOSIS — C778 Secondary and unspecified malignant neoplasm of lymph nodes of multiple regions: Secondary | ICD-10-CM

## 2014-10-08 DIAGNOSIS — C787 Secondary malignant neoplasm of liver and intrahepatic bile duct: Secondary | ICD-10-CM

## 2014-10-08 DIAGNOSIS — Z5111 Encounter for antineoplastic chemotherapy: Secondary | ICD-10-CM

## 2014-10-08 DIAGNOSIS — C50411 Malignant neoplasm of upper-outer quadrant of right female breast: Secondary | ICD-10-CM

## 2014-10-08 LAB — COMPREHENSIVE METABOLIC PANEL (CC13)
ALT: 18 U/L (ref 0–55)
ANION GAP: 7 meq/L (ref 3–11)
AST: 14 U/L (ref 5–34)
Albumin: 2.8 g/dL — ABNORMAL LOW (ref 3.5–5.0)
Alkaline Phosphatase: 82 U/L (ref 40–150)
BUN: 15.2 mg/dL (ref 7.0–26.0)
CALCIUM: 9 mg/dL (ref 8.4–10.4)
CHLORIDE: 110 meq/L — AB (ref 98–109)
CO2: 24 mEq/L (ref 22–29)
CREATININE: 0.7 mg/dL (ref 0.6–1.1)
EGFR: 85 mL/min/{1.73_m2} — ABNORMAL LOW (ref >90)
Glucose: 137 mg/dl (ref 70–140)
Potassium: 4.3 mEq/L (ref 3.5–5.1)
Sodium: 141 mEq/L (ref 136–145)
Total Bilirubin: 0.33 mg/dL (ref 0.20–1.20)
Total Protein: 6.1 g/dL — ABNORMAL LOW (ref 6.4–8.3)

## 2014-10-08 LAB — CBC WITH DIFFERENTIAL/PLATELET
BASO%: 0.4 % (ref 0.0–2.0)
BASOS ABS: 0 10*3/uL (ref 0.0–0.1)
EOS ABS: 0 10*3/uL (ref 0.0–0.5)
EOS%: 0.6 % (ref 0.0–7.0)
HEMATOCRIT: 38.9 % (ref 34.8–46.6)
HGB: 12.4 g/dL (ref 11.6–15.9)
LYMPH#: 1.7 10*3/uL (ref 0.9–3.3)
LYMPH%: 32.7 % (ref 14.0–49.7)
MCH: 28.2 pg (ref 25.1–34.0)
MCHC: 31.9 g/dL (ref 31.5–36.0)
MCV: 88.6 fL (ref 79.5–101.0)
MONO#: 0.5 10*3/uL (ref 0.1–0.9)
MONO%: 8.9 % (ref 0.0–14.0)
NEUT#: 3 10*3/uL (ref 1.5–6.5)
NEUT%: 57.4 % (ref 38.4–76.8)
PLATELETS: 302 10*3/uL (ref 145–400)
RBC: 4.39 10*6/uL (ref 3.70–5.45)
RDW: 17.2 % — ABNORMAL HIGH (ref 11.2–14.5)
WBC: 5.3 10*3/uL (ref 3.9–10.3)

## 2014-10-08 MED ORDER — SODIUM CHLORIDE 0.9 % IJ SOLN
10.0000 mL | INTRAMUSCULAR | Status: DC | PRN
  Administered 2014-10-08: 10 mL
  Filled 2014-10-08: qty 10

## 2014-10-08 MED ORDER — SODIUM CHLORIDE 0.9 % IV SOLN
Freq: Once | INTRAVENOUS | Status: AC
  Administered 2014-10-08: 10:00:00 via INTRAVENOUS
  Filled 2014-10-08: qty 4

## 2014-10-08 MED ORDER — PACLITAXEL CHEMO INJECTION 300 MG/50ML
80.0000 mg/m2 | Freq: Once | INTRAVENOUS | Status: AC
  Administered 2014-10-08: 174 mg via INTRAVENOUS
  Filled 2014-10-08: qty 29

## 2014-10-08 MED ORDER — FAMOTIDINE IN NACL 20-0.9 MG/50ML-% IV SOLN
20.0000 mg | Freq: Once | INTRAVENOUS | Status: AC
  Administered 2014-10-08: 20 mg via INTRAVENOUS

## 2014-10-08 MED ORDER — FAMOTIDINE IN NACL 20-0.9 MG/50ML-% IV SOLN
INTRAVENOUS | Status: AC
  Filled 2014-10-08: qty 50

## 2014-10-08 MED ORDER — DIPHENHYDRAMINE HCL 25 MG PO CAPS
ORAL_CAPSULE | ORAL | Status: AC
  Filled 2014-10-08: qty 1

## 2014-10-08 MED ORDER — HEPARIN SOD (PORK) LOCK FLUSH 100 UNIT/ML IV SOLN
500.0000 [IU] | Freq: Once | INTRAVENOUS | Status: AC | PRN
  Administered 2014-10-08: 500 [IU]
  Filled 2014-10-08: qty 5

## 2014-10-08 MED ORDER — DIPHENHYDRAMINE HCL 25 MG PO CAPS
25.0000 mg | ORAL_CAPSULE | Freq: Once | ORAL | Status: AC
  Administered 2014-10-08: 25 mg via ORAL

## 2014-10-08 MED ORDER — SODIUM CHLORIDE 0.9 % IV SOLN
Freq: Once | INTRAVENOUS | Status: AC
  Administered 2014-10-08: 10:00:00 via INTRAVENOUS

## 2014-10-08 NOTE — Patient Instructions (Signed)
[facility] Discharge Instructions for Patients Receiving Chemotherapy  Today you received the following chemotherapy agents Taxol   To help prevent nausea and vomiting after your treatment, we encourage you to take your nausea medication as directed.   If you develop nausea and vomiting that is not controlled by your nausea medication, call the clinic.   BELOW ARE SYMPTOMS THAT SHOULD BE REPORTED IMMEDIATELY:  *FEVER GREATER THAN 100.5 F  *CHILLS WITH OR WITHOUT FEVER  NAUSEA AND VOMITING THAT IS NOT CONTROLLED WITH YOUR NAUSEA MEDICATION  *UNUSUAL SHORTNESS OF BREATH  *UNUSUAL BRUISING OR BLEEDING  TENDERNESS IN MOUTH AND THROAT WITH OR WITHOUT PRESENCE OF ULCERS  *URINARY PROBLEMS  *BOWEL PROBLEMS  UNUSUAL RASH Items with * indicate a potential emergency and should be followed up as soon as possible.  Feel free to call the clinic you have any questions or concerns. The clinic phone number is [phone].  Please show the CHEMO ALERT CARD at check-in to the Emergency Department and triage nurse.   

## 2014-10-14 NOTE — Assessment & Plan Note (Signed)
Breast cancer of 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 In addition to Right breast, hypermetabolic disease in Rt [address] LN, axilla and mediastinum, bulky disease in liver, Pulm Mets, Path fracture 9th rib  Treatment plan: Taxol weekly(started 09/10/2014 )Herceptin Perjeta every 3 weeks(started 09/03/2014) We will continue with weekly Taxol treatments along with every three-week Herceptin Perjeta. After 3 cycles, we will obtain a CT scan and determine further treatment course.   Current treatment: Today is Week 7 Taxol, Herceptin Perjeta being given once every 3 weeks (8/17 and 9/7 and 9/28) Chemotoxicities: 1. Severe drowsiness related to Benadryl during infusion: Discontinued IV Benadryl, now takes 25 mg by mouth Benadryl 2. Diarrhea 1 3. Mouth sores: Prescribed acyclovir: Resolved  Plan: CT scans and follow up after that.

## 2014-10-15 ENCOUNTER — Ambulatory Visit (HOSPITAL_BASED_OUTPATIENT_CLINIC_OR_DEPARTMENT_OTHER): Payer: 59

## 2014-10-15 ENCOUNTER — Ambulatory Visit (HOSPITAL_BASED_OUTPATIENT_CLINIC_OR_DEPARTMENT_OTHER): Payer: 59 | Admitting: Hematology and Oncology

## 2014-10-15 ENCOUNTER — Other Ambulatory Visit (HOSPITAL_BASED_OUTPATIENT_CLINIC_OR_DEPARTMENT_OTHER): Payer: 59

## 2014-10-15 ENCOUNTER — Encounter: Payer: Self-pay | Admitting: *Deleted

## 2014-10-15 ENCOUNTER — Telehealth: Payer: Self-pay | Admitting: Hematology and Oncology

## 2014-10-15 ENCOUNTER — Encounter: Payer: Self-pay | Admitting: Hematology and Oncology

## 2014-10-15 ENCOUNTER — Other Ambulatory Visit: Payer: 59

## 2014-10-15 VITALS — BP 144/82 | HR 87 | Temp 98.5°F | Resp 18 | Ht 65.5 in | Wt 231.5 lb

## 2014-10-15 DIAGNOSIS — C50411 Malignant neoplasm of upper-outer quadrant of right female breast: Secondary | ICD-10-CM

## 2014-10-15 DIAGNOSIS — C7951 Secondary malignant neoplasm of bone: Secondary | ICD-10-CM | POA: Diagnosis not present

## 2014-10-15 DIAGNOSIS — Z5112 Encounter for antineoplastic immunotherapy: Secondary | ICD-10-CM | POA: Diagnosis not present

## 2014-10-15 DIAGNOSIS — Z5111 Encounter for antineoplastic chemotherapy: Secondary | ICD-10-CM

## 2014-10-15 DIAGNOSIS — C778 Secondary and unspecified malignant neoplasm of lymph nodes of multiple regions: Secondary | ICD-10-CM | POA: Diagnosis not present

## 2014-10-15 DIAGNOSIS — C787 Secondary malignant neoplasm of liver and intrahepatic bile duct: Secondary | ICD-10-CM | POA: Diagnosis not present

## 2014-10-15 DIAGNOSIS — Z171 Estrogen receptor negative status [ER-]: Secondary | ICD-10-CM

## 2014-10-15 LAB — COMPREHENSIVE METABOLIC PANEL (CC13)
ALT: 18 U/L (ref 0–55)
ANION GAP: 7 meq/L (ref 3–11)
AST: 14 U/L (ref 5–34)
Albumin: 2.9 g/dL — ABNORMAL LOW (ref 3.5–5.0)
Alkaline Phosphatase: 83 U/L (ref 40–150)
BUN: 18.2 mg/dL (ref 7.0–26.0)
CHLORIDE: 107 meq/L (ref 98–109)
CO2: 23 meq/L (ref 22–29)
Calcium: 9.3 mg/dL (ref 8.4–10.4)
Creatinine: 0.8 mg/dL (ref 0.6–1.1)
EGFR: 75 mL/min/{1.73_m2} — AB (ref >90)
GLUCOSE: 162 mg/dL — AB (ref 70–140)
POTASSIUM: 4.5 meq/L (ref 3.5–5.1)
SODIUM: 138 meq/L (ref 136–145)
Total Bilirubin: 0.34 mg/dL (ref 0.20–1.20)
Total Protein: 6.5 g/dL (ref 6.4–8.3)

## 2014-10-15 LAB — CBC WITH DIFFERENTIAL/PLATELET
BASO%: 1 % (ref 0.0–2.0)
Basophils Absolute: 0.1 10*3/uL (ref 0.0–0.1)
EOS ABS: 0 10*3/uL (ref 0.0–0.5)
EOS%: 0.8 % (ref 0.0–7.0)
HCT: 40.1 % (ref 34.8–46.6)
HGB: 12.7 g/dL (ref 11.6–15.9)
LYMPH%: 28 % (ref 14.0–49.7)
MCH: 27.7 pg (ref 25.1–34.0)
MCHC: 31.8 g/dL (ref 31.5–36.0)
MCV: 87.2 fL (ref 79.5–101.0)
MONO#: 0.5 10*3/uL (ref 0.1–0.9)
MONO%: 9.2 % (ref 0.0–14.0)
NEUT%: 61 % (ref 38.4–76.8)
NEUTROS ABS: 3.3 10*3/uL (ref 1.5–6.5)
PLATELETS: 320 10*3/uL (ref 145–400)
RBC: 4.59 10*6/uL (ref 3.70–5.45)
RDW: 18.2 % — ABNORMAL HIGH (ref 11.2–14.5)
WBC: 5.4 10*3/uL (ref 3.9–10.3)
lymph#: 1.5 10*3/uL (ref 0.9–3.3)

## 2014-10-15 LAB — TECHNOLOGIST REVIEW

## 2014-10-15 MED ORDER — DEXAMETHASONE SODIUM PHOSPHATE 100 MG/10ML IJ SOLN
Freq: Once | INTRAMUSCULAR | Status: AC
  Administered 2014-10-15: 10:00:00 via INTRAVENOUS
  Filled 2014-10-15: qty 4

## 2014-10-15 MED ORDER — ACETAMINOPHEN 325 MG PO TABS
ORAL_TABLET | ORAL | Status: AC
Start: 2014-10-15 — End: 2014-10-15
  Filled 2014-10-15: qty 2

## 2014-10-15 MED ORDER — SODIUM CHLORIDE 0.9 % IV SOLN: Freq: Once | INTRAVENOUS | Status: AC

## 2014-10-15 MED ORDER — SODIUM CHLORIDE 0.9 % IV SOLN
Freq: Once | INTRAVENOUS | Status: AC
  Administered 2014-10-15: 10:00:00 via INTRAVENOUS

## 2014-10-15 MED ORDER — HEPARIN SOD (PORK) LOCK FLUSH 100 UNIT/ML IV SOLN
500.0000 [IU] | Freq: Once | INTRAVENOUS | Status: AC | PRN
  Administered 2014-10-15: 500 [IU]
  Filled 2014-10-15: qty 5

## 2014-10-15 MED ORDER — FAMOTIDINE IN NACL 20-0.9 MG/50ML-% IV SOLN
20.0000 mg | Freq: Once | INTRAVENOUS | Status: AC
  Administered 2014-10-15: 20 mg via INTRAVENOUS

## 2014-10-15 MED ORDER — PACLITAXEL CHEMO INJECTION 300 MG/50ML
80.0000 mg/m2 | Freq: Once | INTRAVENOUS | Status: AC
  Administered 2014-10-15: 174 mg via INTRAVENOUS
  Filled 2014-10-15: qty 29

## 2014-10-15 MED ORDER — SODIUM CHLORIDE 0.9 % IV SOLN
420.0000 mg | Freq: Once | INTRAVENOUS | Status: AC
  Administered 2014-10-15: 420 mg via INTRAVENOUS
  Filled 2014-10-15: qty 14

## 2014-10-15 MED ORDER — SODIUM CHLORIDE 0.9 % IV SOLN
6.0000 mg/kg | Freq: Once | INTRAVENOUS | Status: AC
  Administered 2014-10-15: 630 mg via INTRAVENOUS
  Filled 2014-10-15: qty 30

## 2014-10-15 MED ORDER — DIPHENHYDRAMINE HCL 25 MG PO CAPS
25.0000 mg | ORAL_CAPSULE | Freq: Once | ORAL | Status: AC
  Administered 2014-10-15: 25 mg via ORAL

## 2014-10-15 MED ORDER — FAMOTIDINE IN NACL 20-0.9 MG/50ML-% IV SOLN
INTRAVENOUS | Status: AC
Start: 2014-10-15 — End: 2014-10-15
  Filled 2014-10-15: qty 50

## 2014-10-15 MED ORDER — DIPHENHYDRAMINE HCL 25 MG PO CAPS
ORAL_CAPSULE | ORAL | Status: AC
  Filled 2014-10-15: qty 2

## 2014-10-15 MED ORDER — SODIUM CHLORIDE 0.9 % IJ SOLN
10.0000 mL | INTRAMUSCULAR | Status: DC | PRN
  Administered 2014-10-15: 10 mL
  Filled 2014-10-15: qty 10

## 2014-10-15 MED ORDER — ACETAMINOPHEN 325 MG PO TABS
650.0000 mg | ORAL_TABLET | Freq: Once | ORAL | Status: AC
  Administered 2014-10-15: 650 mg via ORAL

## 2014-10-15 NOTE — Patient Instructions (Signed)
[facility] Discharge Instructions for Patients Receiving Chemotherapy  Today you received the following chemotherapy agents: Taxol, Herceptin and Perjeta.  To help prevent nausea and vomiting after your treatment, we encourage you to take your nausea medication: Compazine. Take one every 6 hours as needed. You may also take Zofran every 8 hours as needed.   If you develop nausea and vomiting that is not controlled by your nausea medication, call the clinic.   BELOW ARE SYMPTOMS THAT SHOULD BE REPORTED IMMEDIATELY:  *FEVER GREATER THAN 100.5 F  *CHILLS WITH OR WITHOUT FEVER  NAUSEA AND VOMITING THAT IS NOT CONTROLLED WITH YOUR NAUSEA MEDICATION  *UNUSUAL SHORTNESS OF BREATH  *UNUSUAL BRUISING OR BLEEDING  TENDERNESS IN MOUTH AND THROAT WITH OR WITHOUT PRESENCE OF ULCERS  *URINARY PROBLEMS  *BOWEL PROBLEMS  UNUSUAL RASH Items with * indicate a potential emergency and should be followed up as soon as possible.  Feel free to call the clinic should you have any questions or concerns. The clinic phone number is [phone].  Please show the [place] at check-in to the Emergency Department and triage nurse.

## 2014-10-15 NOTE — Telephone Encounter (Signed)
Gave avs & calendar for October. °

## 2014-10-15 NOTE — Progress Notes (Signed)
Patient Care Team: Seward Carol, MD as PCP - General (Internal Medicine)  DIAGNOSIS: No matching staging information was found for the patient.  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 In addition to Right breast, hypermetabolic disease in Rt [address] LN, axilla and mediastinum, bulky disease in liver, Pulm Mets, Path fracture 9th rib   09/03/2014 -  Chemotherapy Herceptin Perjeta started 09/03/2014, Taxol weekly added 09/10/2014    CHIEF COMPLIANT: Weeks 7 Taxol, cycle 3 Herceptin Perjeta  INTERVAL HISTORY: Denise Morton is a 45 year with above-mentioned history of metastatic breast cancer who is currently receiving weekly Taxol treatments along with every three-week Herceptin Perjeta. She is tolerating Taxol extremely well denies any neuropathy. Denies any nausea or vomiting. Does have a runny nose. Denies any fevers or chills. The right breast inflammatory breast cancer is markedly shrunken size.  REVIEW OF SYSTEMS:   Constitutional: Denies fevers, chills or abnormal weight loss Eyes: Denies blurriness of vision Ears, nose, mouth, throat, and face: Denies mucositis or sore throat Respiratory: Denies cough, dyspnea or wheezes Cardiovascular: Denies palpitation, chest discomfort or lower extremity swelling Gastrointestinal:  Denies nausea, heartburn or change in bowel habits Skin: Denies abnormal skin rashes Lymphatics: Denies new lymphadenopathy or easy bruising Neurological:Denies numbness, tingling or new weaknesses Behavioral/Psych: Mood is stable, no new changes   All other systems were reviewed with the patient and are negative.  I have reviewed the past medical history, past surgical history,  social history and family history with the patient and they are unchanged from previous note.  ALLERGIES:  is allergic to penicillin g and sulfa antibiotics.  MEDICATIONS:  Current Outpatient Prescriptions  Medication Sig Dispense Refill  . ALPRAZolam (XANAX) 0.25 MG tablet Take 0.25 mg by mouth daily as needed for anxiety.    . carvedilol (COREG) 6.25 MG tablet Take 1 tablet (6.25 mg total) by mouth 2 (two) times daily with a meal. 60 tablet 3  . lidocaine-prilocaine (EMLA) cream Apply to port 1-2 hours before procedure 30 g 1  . loperamide (IMODIUM) 2 MG capsule Take 2 mg by mouth as needed for diarrhea or loose stools.    . Multiple Vitamin (MULTIVITAMIN WITH MINERALS) TABS tablet Take 1 tablet by mouth daily.    . nicotine polacrilex (COMMIT) 2 MG lozenge Take 2 mg by mouth as needed for smoking cessation.    . ondansetron (ZOFRAN) 8 MG tablet Take 1 tablet (8 mg total) by mouth 2 (two) times daily. Start the day after chemo for 2 days. Then take as needed for nausea or vomiting. 30 tablet 1  . prochlorperazine (COMPAZINE) 10 MG tablet Take 1 tablet (10 mg total) by mouth every 6 (six) hours as needed (Nausea or vomiting). 30 tablet 1  . spironolactone (ALDACTONE) 25 MG tablet Take 0.5 tablets (12.5 mg total) by mouth daily. 45 tablet 3   No current facility-administered medications for this visit.    PHYSICAL EXAMINATION: ECOG PERFORMANCE STATUS: 1 - Symptomatic but completely ambulatory  Filed Vitals:   10/15/14 0815  BP: 144/82  Pulse: 87  Temp: 98.5 F (36.9 C)  Resp: 18   Filed Weights   10/15/14 0815  Weight: 231 lb 8 oz (105.008 kg)    GENERAL:alert, no  distress and comfortable SKIN: skin color, texture, turgor are normal, no rashes or significant lesions EYES: normal, Conjunctiva are pink and non-injected, sclera clear OROPHARYNX:no exudate, no erythema and lips, buccal mucosa, and tongue normal  NECK: supple, thyroid normal size, non-tender, without  nodularity LYMPH:  no palpable lymphadenopathy in the cervical, axillary or inguinal LUNGS: clear to auscultation and percussion with normal breathing effort HEART: regular rate & rhythm and no murmurs and no lower extremity edema ABDOMEN:abdomen soft, non-tender and normal bowel sounds Musculoskeletal:no cyanosis of digits and no clubbing  NEURO: alert & oriented x 3 with fluent speech, no focal motor/sensory deficits  LABORATORY DATA:  I have reviewed the data as listed   Chemistry      Component Value Date/Time   NA 141 10/08/2014 0828   NA 140 12/03/2006 1854   K 4.3 10/08/2014 0828   K 3.8 12/03/2006 1854   CL 108 12/03/2006 1854   CO2 24 10/08/2014 0828   BUN 15.2 10/08/2014 0828   BUN 21 12/03/2006 1854   CREATININE 0.7 10/08/2014 0828   CREATININE 1.0 12/03/2006 1854      Component Value Date/Time   CALCIUM 9.0 10/08/2014 0828   ALKPHOS 82 10/08/2014 0828   AST 14 10/08/2014 0828   ALT 18 10/08/2014 0828   BILITOT 0.33 10/08/2014 0828       Lab Results  Component Value Date   WBC 5.4 10/15/2014   HGB 12.7 10/15/2014   HCT 40.1 10/15/2014   MCV 87.2 10/15/2014   PLT 320 10/15/2014   NEUTROABS 3.3 10/15/2014   ASSESSMENT & PLAN:  Breast cancer of 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 In addition to Right breast, hypermetabolic disease in Rt [address] LN, axilla and mediastinum, bulky disease in liver, Pulm Mets, Path fracture 9th rib  Treatment plan: Taxol weekly(started 09/10/2014 )Herceptin Perjeta every 3 weeks(started 09/03/2014) We will continue with weekly Taxol treatments along with every three-week Herceptin Perjeta. After 3 cycles, we will obtain a CT scan and determine further treatment course.   Current treatment: Today  is Week 7 Taxol, Herceptin Perjeta being given once every 3 weeks (8/17 and 9/7 and 9/28) Chemotoxicities: 1. Severe drowsiness related to Benadryl during infusion: Discontinued IV Benadryl, now takes 25 mg by mouth Benadryl 2. Diarrhea 1 3. Mouth sores: Prescribed acyclovir: Resolved  Plan: CT scans and follow up after that.   No orders of the defined types were placed in this encounter.   The patient has a good understanding of the overall plan. she agrees with it. she will call with any problems that may develop before the next visit here.   Rulon Eisenmenger, MD

## 2014-10-17 ENCOUNTER — Encounter (HOSPITAL_COMMUNITY): Payer: Self-pay

## 2014-10-17 ENCOUNTER — Ambulatory Visit (HOSPITAL_COMMUNITY)
Admission: RE | Admit: 2014-10-17 | Discharge: 2014-10-17 | Disposition: A | Discharge status: Home / Self Care | Payer: 59 | Source: Ambulatory Visit | Attending: Hematology and Oncology | Admitting: Hematology and Oncology

## 2014-10-17 DIAGNOSIS — R590 Localized enlarged lymph nodes: Secondary | ICD-10-CM | POA: Diagnosis not present

## 2014-10-17 DIAGNOSIS — C787 Secondary malignant neoplasm of liver and intrahepatic bile duct: Secondary | ICD-10-CM | POA: Insufficient documentation

## 2014-10-17 DIAGNOSIS — N200 Calculus of kidney: Secondary | ICD-10-CM | POA: Insufficient documentation

## 2014-10-17 DIAGNOSIS — R918 Other nonspecific abnormal finding of lung field: Secondary | ICD-10-CM | POA: Diagnosis not present

## 2014-10-17 DIAGNOSIS — I700 Atherosclerosis of aorta: Secondary | ICD-10-CM | POA: Diagnosis not present

## 2014-10-17 DIAGNOSIS — M899 Disorder of bone, unspecified: Secondary | ICD-10-CM | POA: Diagnosis not present

## 2014-10-17 DIAGNOSIS — C50411 Malignant neoplasm of upper-outer quadrant of right female breast: Secondary | ICD-10-CM | POA: Diagnosis not present

## 2014-10-17 MED ORDER — IOHEXOL 300 MG/ML  SOLN
100.0000 mL | Freq: Once | INTRAMUSCULAR | Status: AC | PRN
  Administered 2014-10-17: 100 mL via INTRAVENOUS

## 2014-10-20 ENCOUNTER — Ambulatory Visit (HOSPITAL_BASED_OUTPATIENT_CLINIC_OR_DEPARTMENT_OTHER): Payer: 59 | Admitting: Hematology and Oncology

## 2014-10-20 ENCOUNTER — Encounter: Payer: Self-pay | Admitting: Hematology and Oncology

## 2014-10-20 ENCOUNTER — Other Ambulatory Visit (HOSPITAL_BASED_OUTPATIENT_CLINIC_OR_DEPARTMENT_OTHER): Payer: 59

## 2014-10-20 ENCOUNTER — Telehealth: Payer: Self-pay | Admitting: Hematology and Oncology

## 2014-10-20 ENCOUNTER — Telehealth: Payer: Self-pay | Admitting: *Deleted

## 2014-10-20 ENCOUNTER — Other Ambulatory Visit: Payer: Self-pay | Admitting: *Deleted

## 2014-10-20 VITALS — BP 131/73 | HR 86 | Temp 98.6°F | Resp 18 | Ht 65.5 in | Wt 232.6 lb

## 2014-10-20 DIAGNOSIS — Z171 Estrogen receptor negative status [ER-]: Secondary | ICD-10-CM

## 2014-10-20 DIAGNOSIS — C50411 Malignant neoplasm of upper-outer quadrant of right female breast: Secondary | ICD-10-CM

## 2014-10-20 DIAGNOSIS — C787 Secondary malignant neoplasm of liver and intrahepatic bile duct: Secondary | ICD-10-CM

## 2014-10-20 DIAGNOSIS — C773 Secondary and unspecified malignant neoplasm of axilla and upper limb lymph nodes: Secondary | ICD-10-CM

## 2014-10-20 DIAGNOSIS — C781 Secondary malignant neoplasm of mediastinum: Secondary | ICD-10-CM

## 2014-10-20 DIAGNOSIS — C7802 Secondary malignant neoplasm of left lung: Secondary | ICD-10-CM

## 2014-10-20 DIAGNOSIS — C7800 Secondary malignant neoplasm of unspecified lung: Secondary | ICD-10-CM | POA: Insufficient documentation

## 2014-10-20 DIAGNOSIS — R0602 Shortness of breath: Secondary | ICD-10-CM

## 2014-10-20 DIAGNOSIS — C7951 Secondary malignant neoplasm of bone: Secondary | ICD-10-CM

## 2014-10-20 DIAGNOSIS — C7801 Secondary malignant neoplasm of right lung: Secondary | ICD-10-CM | POA: Diagnosis not present

## 2014-10-20 LAB — CBC WITH DIFFERENTIAL/PLATELET
BASO%: 0.4 % (ref 0.0–2.0)
Basophils Absolute: 0 10*3/uL (ref 0.0–0.1)
EOS ABS: 0 10*3/uL (ref 0.0–0.5)
EOS%: 0.4 % (ref 0.0–7.0)
HCT: 34.3 % — ABNORMAL LOW (ref 34.8–46.6)
HGB: 11 g/dL — ABNORMAL LOW (ref 11.6–15.9)
LYMPH%: 22.7 % (ref 14.0–49.7)
MCH: 27.9 pg (ref 25.1–34.0)
MCHC: 32.1 g/dL (ref 31.5–36.0)
MCV: 86.9 fL (ref 79.5–101.0)
MONO#: 0.2 10*3/uL (ref 0.1–0.9)
MONO%: 5.6 % (ref 0.0–14.0)
NEUT%: 70.9 % (ref 38.4–76.8)
NEUTROS ABS: 2.9 10*3/uL (ref 1.5–6.5)
Platelets: 286 10*3/uL (ref 145–400)
RBC: 3.95 10*6/uL (ref 3.70–5.45)
RDW: 18.5 % — ABNORMAL HIGH (ref 11.2–14.5)
WBC: 4.1 10*3/uL (ref 3.9–10.3)
lymph#: 0.9 10*3/uL (ref 0.9–3.3)

## 2014-10-20 LAB — COMPREHENSIVE METABOLIC PANEL (CC13)
ALT: 13 U/L (ref 0–55)
AST: 13 U/L (ref 5–34)
Albumin: 2.5 g/dL — ABNORMAL LOW (ref 3.5–5.0)
Alkaline Phosphatase: 69 U/L (ref 40–150)
Anion Gap: 4 mEq/L (ref 3–11)
BILIRUBIN TOTAL: 0.31 mg/dL (ref 0.20–1.20)
BUN: 13.4 mg/dL (ref 7.0–26.0)
CHLORIDE: 108 meq/L (ref 98–109)
CO2: 26 meq/L (ref 22–29)
Calcium: 8.2 mg/dL — ABNORMAL LOW (ref 8.4–10.4)
Creatinine: 0.8 mg/dL (ref 0.6–1.1)
EGFR: 75 mL/min/{1.73_m2} — AB (ref >90)
GLUCOSE: 164 mg/dL — AB (ref 70–140)
POTASSIUM: 4.2 meq/L (ref 3.5–5.1)
SODIUM: 138 meq/L (ref 136–145)
TOTAL PROTEIN: 5.6 g/dL — AB (ref 6.4–8.3)

## 2014-10-20 NOTE — Assessment & Plan Note (Signed)
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 In addition to Right breast, hypermetabolic disease in Rt [address] LN, axilla and mediastinum, bulky disease in liver, Pulm Mets, Path fracture 9th rib  Treatment plan: Taxol weekly(started 09/10/2014 )Herceptin Perjeta every 3 weeks(started 09/03/2014) We will continue with weekly Taxol treatments along with every three-week Herceptin Perjeta. After 3 cycles, we will obtain a CT scan and determine further treatment course.   Current treatment: Today is Week 7 Taxol, Herceptin Perjeta being given once every 3 weeks (8/17 and 9/7 and 9/28)  Chemotoxicities: 1. Severe drowsiness related to Benadryl during infusion: Discontinued IV Benadryl, now takes 25 mg by mouth Benadryl 2. Diarrhea 1 3. Mouth sores: Prescribed acyclovir: Resolved  Shortness of breath or exertion: Physical examination of the chest does not reveal any longer heart dysfunction. I discussed with her that her symptoms could be related to fatigue. She will have another echocardiogram first week of November. I did not recommend any routine scans are plain films at this point  Return to clinic with the next cycle of chemotherapy. We will cancel her appointment for 10/22/2014.

## 2014-10-20 NOTE — Telephone Encounter (Signed)
Appointments made and avs printed for patient °

## 2014-10-20 NOTE — Progress Notes (Signed)
Patient Care Team: Seward Carol, MD as PCP - General (Internal Medicine)  DIAGNOSIS: No matching staging information was found for the patient.  SUMMARY OF ONCOLOGIC HISTORY:   Breast cancer of upper-outer quadrant of right female breast ([place])   08/15/2014 Initial Diagnosis Right Breast Inflammatory invasive ductal cancer: 17 cm by ultrasound along with 13 cm ulceration, node also positive T4N1 (stage 3C) ER 0%, PR 0%, Her-2 Pending   08/22/2014 PET scan Metastatic disease. In addition to Right breast, hypermetabolic disease in Rt Supraclav LN, axilla and mediastinum, bulky disease in liver, Pulm Mets, Path fracture 9th rib   09/03/2014 -  Chemotherapy Herceptin Perjeta started 09/03/2014, Taxol weekly added 09/10/2014   10/17/2014 Imaging CT scans: Decrease in right axillary lymph node, decreased skin thickening of the right breast, decreased in bilateral pulmonary nodules, decrease in liver metastases    CHIEF COMPLIANT: Urgent visit for shortness of breath  INTERVAL HISTORY: Denise Morton is a 64 year old above-mentioned history metastatic breast cancer with extensive metastatic disease involving lungs, liver and right axillary lymph node metastases. She underwent a recent CT scan is here today to discuss the result. She also called in urgently complaining of worsening shortness of breath. She wanted to be seen sooner than her appointment in a couple of days. She reports that she gets short of breath when she walks for short distances. It gets better through the day and by the end of the day she actually was able to walk longer distances. It is not accompanied by chest pain.  REVIEW OF SYSTEMS:   Constitutional: Denies fevers, chills or abnormal weight loss Eyes: Denies blurriness of vision Ears, nose, mouth, throat, and face: Denies mucositis or sore throat ResShortness of breath exertionalrdiovascular: Denies palpitation, chest discomfort or lower extremity swelling Gastrointestinal:  Denies  nausea, heartburn or change in bowel habits Skin: Denies abnormal skin rashes Lymphatics: Denies new lymphadenopathy or easy bruising Neurological:Denies numbness, tingling or new weaknesses Behavioral/Psych: Mood is stable, no new changes  BreSoftening of the skin or the breast All other systems were reviewed with the patient and are negative.  I have reviewed the past medical history, past surgical history, social history and family history with the patient and they are unchanged from previous note.  ALLERGIES:  is allergic to penicillin g and sulfa antibiotics.  MEDICATIONS:  Current Outpatient Prescriptions  Medication Sig Dispense Refill  . ALPRAZolam (XANAX) 0.25 MG tablet Take 0.25 mg by mouth daily as needed for anxiety.    . carvedilol (COREG) 6.25 MG tablet Take 1 tablet (6.25 mg total) by mouth 2 (two) times daily with a meal. 60 tablet 3  . lidocaine-prilocaine (EMLA) cream Apply to port 1-2 hours before procedure 30 g 1  . loperamide (IMODIUM) 2 MG capsule Take 2 mg by mouth as needed for diarrhea or loose stools.    . Multiple Vitamin (MULTIVITAMIN WITH MINERALS) TABS tablet Take 1 tablet by mouth daily.    . nicotine polacrilex (COMMIT) 2 MG lozenge Take 2 mg by mouth as needed for smoking cessation.    . ondansetron (ZOFRAN) 8 MG tablet Take 1 tablet (8 mg total) by mouth 2 (two) times daily. Start the day after chemo for 2 days. Then take as needed for nausea or vomiting. 30 tablet 1  . prochlorperazine (COMPAZINE) 10 MG tablet Take 1 tablet (10 mg total) by mouth every 6 (six) hours as needed (Nausea or vomiting). 30 tablet 1  . spironolactone (ALDACTONE) 25 MG tablet Take 0.5 tablets (12.5  mg total) by mouth daily. 45 tablet 3   No current facility-administered medications for this visit.    PHYSICAL EXAMINATION: ECOG PERFORMANCE STATUS: 1 - Symptomatic but completely ambulatory  Filed Vitals:   10/20/14 1427  BP: 131/73  Pulse: 86  Temp: 98.6 F (37 C)  Resp:  18   Filed Weights   10/20/14 1427  Weight: 232 lb 9.6 oz (105.507 kg)    GENERAL:alert, no distress and comfortable SKIN: skin color, texture, turgor are normal, no rashes or significant lesions EYES: normal, Conjunctiva are pink and non-injected, sclera clear OROPHARYNX:no exudate, no erythema and lips, buccal mucosa, and tongue normal  NECK: supple, thyroid normal size, non-tender, without nodularity LYMPH:  no palpable lymphadenopathy in the cervical, axillary or inguinal LUNGS: clear to auscultation and percussion with normal breathing effort HEART: regular rate & rhythm and no murmurs and no lower extremity edema ABDOMEN:abdomen soft, non-tender and normal bowel sounds Musculoskeletal:no cyanosis of digits and no clubbing  NEURO: alert & oriented x 3 with fluent speech, no focal motor/sensory deficits  LABORATORY DATA:  I have reviewed the data as listed   Chemistry      Component Value Date/Time   NA 138 10/20/2014 1353   NA 140 12/03/2006 1854   K 4.2 10/20/2014 1353   K 3.8 12/03/2006 1854   CL 108 12/03/2006 1854   CO2 26 10/20/2014 1353   BUN 13.4 10/20/2014 1353   BUN 21 12/03/2006 1854   CREATININE 0.8 10/20/2014 1353   CREATININE 1.0 12/03/2006 1854      Component Value Date/Time   CALCIUM 8.2* 10/20/2014 1353   ALKPHOS 69 10/20/2014 1353   AST 13 10/20/2014 1353   ALT 13 10/20/2014 1353   BILITOT 0.31 10/20/2014 1353       Lab Results  Component Value Date   WBC 4.1 10/20/2014   HGB 11.0* 10/20/2014   HCT 34.3* 10/20/2014   MCV 86.9 10/20/2014   PLT 286 10/20/2014   NEUTROABS 2.9 10/20/2014   ASSESSMENT & PLAN:  Breast cancer of 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 In addition to Right breast, hypermetabolic disease  in Rt Supraclav LN, axilla and mediastinum, bulky disease in liver, Pulm Mets, Path fracture 9th rib  Treatment plan: Taxol weekly(started 09/10/2014 )Herceptin Perjeta every 3 weeks(started 09/03/2014) We will continue with weekly Taxol treatments along with every three-week Herceptin Perjeta. After 3 cycles, we will obtain a CT scan and determine further treatment course.   Current treatment: Completed Week 7 Taxol, Herceptin Perjeta being given once every 3 weeks (8/17 and 9/7 and 9/28)  Chemotoxicities: 1. Severe drowsiness related to Benadryl during infusion: Discontinued IV Benadryl, now takes 25 mg by mouth Benadryl 2. Diarrhea 1 3. Mouth sores: Prescribed acyclovir: Resolved  Shortness of breath or exertion: Physical examination of the chest does not reveal any longer heart dysfunction. I discussed with her that her symptoms could be related to fatigue. She will have another echocardiogram first week of November. I did not recommend any routine scans are plain films at this point  Return to clinic with the next cycle of chemotherapy. We will cancel her appointment for 10/22/2014.   No orders of the defined types were placed in this encounter.   The patient has a good understanding of the overall  plan. she agrees with it. she will call with any problems that may develop before the next visit here.   Rozelia Catapano K, MD      

## 2014-10-20 NOTE — Telephone Encounter (Signed)
Received call from patient stating she is experiencing shortness of breath.  She denies any pain.  She states she can walk 8 feet and have to catch her breath.  She will get labs and see Dr. Lindi Adie at 145pm.

## 2014-10-22 ENCOUNTER — Other Ambulatory Visit (HOSPITAL_BASED_OUTPATIENT_CLINIC_OR_DEPARTMENT_OTHER): Payer: 59

## 2014-10-22 ENCOUNTER — Ambulatory Visit: Payer: 59 | Admitting: Hematology and Oncology

## 2014-10-22 ENCOUNTER — Ambulatory Visit (HOSPITAL_BASED_OUTPATIENT_CLINIC_OR_DEPARTMENT_OTHER): Payer: 59

## 2014-10-22 VITALS — BP 155/85 | HR 77 | Temp 97.7°F | Resp 22

## 2014-10-22 DIAGNOSIS — Z5111 Encounter for antineoplastic chemotherapy: Secondary | ICD-10-CM | POA: Diagnosis not present

## 2014-10-22 DIAGNOSIS — C50411 Malignant neoplasm of upper-outer quadrant of right female breast: Secondary | ICD-10-CM

## 2014-10-22 DIAGNOSIS — C787 Secondary malignant neoplasm of liver and intrahepatic bile duct: Secondary | ICD-10-CM | POA: Diagnosis not present

## 2014-10-22 LAB — CBC WITH DIFFERENTIAL/PLATELET
BASO%: 0.6 % (ref 0.0–2.0)
Basophils Absolute: 0 10*3/uL (ref 0.0–0.1)
EOS%: 1 % (ref 0.0–7.0)
Eosinophils Absolute: 0.1 10*3/uL (ref 0.0–0.5)
HEMATOCRIT: 36.5 % (ref 34.8–46.6)
HGB: 11.5 g/dL — ABNORMAL LOW (ref 11.6–15.9)
LYMPH#: 1.3 10*3/uL (ref 0.9–3.3)
LYMPH%: 26.7 % (ref 14.0–49.7)
MCH: 28.2 pg (ref 25.1–34.0)
MCHC: 31.5 g/dL (ref 31.5–36.0)
MCV: 89.5 fL (ref 79.5–101.0)
MONO#: 0.5 10*3/uL (ref 0.1–0.9)
MONO%: 9.4 % (ref 0.0–14.0)
NEUT%: 62.3 % (ref 38.4–76.8)
NEUTROS ABS: 3 10*3/uL (ref 1.5–6.5)
NRBC: 0 % (ref 0–0)
Platelets: 316 10*3/uL (ref 145–400)
RBC: 4.08 10*6/uL (ref 3.70–5.45)
RDW: 18.5 % — AB (ref 11.2–14.5)
WBC: 4.8 10*3/uL (ref 3.9–10.3)

## 2014-10-22 LAB — TECHNOLOGIST REVIEW

## 2014-10-22 MED ORDER — FAMOTIDINE IN NACL 20-0.9 MG/50ML-% IV SOLN
20.0000 mg | Freq: Once | INTRAVENOUS | Status: AC
  Administered 2014-10-22: 20 mg via INTRAVENOUS

## 2014-10-22 MED ORDER — DIPHENHYDRAMINE HCL 25 MG PO CAPS
25.0000 mg | ORAL_CAPSULE | Freq: Once | ORAL | Status: AC
  Administered 2014-10-22: 25 mg via ORAL

## 2014-10-22 MED ORDER — PACLITAXEL CHEMO INJECTION 300 MG/50ML
80.0000 mg/m2 | Freq: Once | INTRAVENOUS | Status: AC
  Administered 2014-10-22: 174 mg via INTRAVENOUS
  Filled 2014-10-22: qty 29

## 2014-10-22 MED ORDER — SODIUM CHLORIDE 0.9 % IV SOLN
Freq: Once | INTRAVENOUS | Status: AC
  Administered 2014-10-22: 09:00:00 via INTRAVENOUS

## 2014-10-22 MED ORDER — HEPARIN SOD (PORK) LOCK FLUSH 100 UNIT/ML IV SOLN
500.0000 [IU] | Freq: Once | INTRAVENOUS | Status: AC | PRN
  Administered 2014-10-22: 500 [IU]
  Filled 2014-10-22: qty 5

## 2014-10-22 MED ORDER — SODIUM CHLORIDE 0.9 % IJ SOLN
10.0000 mL | INTRAMUSCULAR | Status: DC | PRN
  Administered 2014-10-22: 10 mL
  Filled 2014-10-22: qty 10

## 2014-10-22 MED ORDER — SODIUM CHLORIDE 0.9 % IV SOLN
Freq: Once | INTRAVENOUS | Status: AC
  Administered 2014-10-22: 09:00:00 via INTRAVENOUS
  Filled 2014-10-22: qty 4

## 2014-10-22 MED ORDER — DIPHENHYDRAMINE HCL 25 MG PO CAPS
ORAL_CAPSULE | ORAL | Status: AC
  Filled 2014-10-22: qty 1

## 2014-10-22 MED ORDER — ACETAMINOPHEN 325 MG PO TABS
ORAL_TABLET | ORAL | Status: AC
  Filled 2014-10-22: qty 2

## 2014-10-22 MED ORDER — FAMOTIDINE IN NACL 20-0.9 MG/50ML-% IV SOLN
INTRAVENOUS | Status: AC
  Filled 2014-10-22: qty 50

## 2014-10-22 NOTE — Patient Instructions (Signed)
[facility] Discharge Instructions for Patients Receiving Chemotherapy  Today you received the following chemotherapy agents Taxol   To help prevent nausea and vomiting after your treatment, we encourage you to take your nausea medication as directed.   If you develop nausea and vomiting that is not controlled by your nausea medication, call the clinic.   BELOW ARE SYMPTOMS THAT SHOULD BE REPORTED IMMEDIATELY:  *FEVER GREATER THAN 100.5 F  *CHILLS WITH OR WITHOUT FEVER  NAUSEA AND VOMITING THAT IS NOT CONTROLLED WITH YOUR NAUSEA MEDICATION  *UNUSUAL SHORTNESS OF BREATH  *UNUSUAL BRUISING OR BLEEDING  TENDERNESS IN MOUTH AND THROAT WITH OR WITHOUT PRESENCE OF ULCERS  *URINARY PROBLEMS  *BOWEL PROBLEMS  UNUSUAL RASH Items with * indicate a potential emergency and should be followed up as soon as possible.  Feel free to call the clinic you have any questions or concerns. The clinic phone number is [phone].  Please show the CHEMO ALERT CARD at check-in to the Emergency Department and triage nurse.   

## 2014-10-28 NOTE — Assessment & Plan Note (Signed)
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 In addition to Right breast, hypermetabolic disease in Rt Supraclav LN, axilla and mediastinum, bulky disease in liver, Pulm Mets, Path fracture 9th rib  Treatment plan: Taxol weekly(started 09/10/2014 )Herceptin Perjeta every 3 weeks(started 09/03/2014)  Current treatment: Today is Week 9 Taxol, Herceptin Perjeta being given once every 3 weeks (8/17 and 9/7 and 9/28)  Chemotoxicities: 1. Severe drowsiness related to Benadryl during infusion: Discontinued IV Benadryl, now takes 25 mg by mouth Benadryl 2. Diarrhea 1 3. Mouth sores: Prescribed acyclovir: Resolved  Shortness of breath or exertion: Physical examination of the chest does not reveal any longer heart dysfunction. I discussed with her that her symptoms could be related to fatigue. She will have another echocardiogram first week of November. I did not recommend any routine scans are plain films at this point  Return to clinic with the next cycle of chemotherapy.

## 2014-10-29 ENCOUNTER — Encounter: Payer: Self-pay | Admitting: Hematology and Oncology

## 2014-10-29 ENCOUNTER — Ambulatory Visit: Payer: 59

## 2014-10-29 ENCOUNTER — Telehealth: Payer: Self-pay | Admitting: Hematology and Oncology

## 2014-10-29 ENCOUNTER — Ambulatory Visit (HOSPITAL_BASED_OUTPATIENT_CLINIC_OR_DEPARTMENT_OTHER): Payer: 59

## 2014-10-29 ENCOUNTER — Other Ambulatory Visit: Payer: 59

## 2014-10-29 ENCOUNTER — Ambulatory Visit (HOSPITAL_BASED_OUTPATIENT_CLINIC_OR_DEPARTMENT_OTHER): Payer: 59 | Admitting: Hematology and Oncology

## 2014-10-29 ENCOUNTER — Other Ambulatory Visit (HOSPITAL_BASED_OUTPATIENT_CLINIC_OR_DEPARTMENT_OTHER): Payer: 59

## 2014-10-29 VITALS — BP 144/75 | HR 83 | Temp 99.3°F | Resp 18 | Ht 65.5 in | Wt 232.7 lb

## 2014-10-29 DIAGNOSIS — C787 Secondary malignant neoplasm of liver and intrahepatic bile duct: Secondary | ICD-10-CM | POA: Diagnosis not present

## 2014-10-29 DIAGNOSIS — C778 Secondary and unspecified malignant neoplasm of lymph nodes of multiple regions: Secondary | ICD-10-CM

## 2014-10-29 DIAGNOSIS — C50411 Malignant neoplasm of upper-outer quadrant of right female breast: Secondary | ICD-10-CM

## 2014-10-29 DIAGNOSIS — C7951 Secondary malignant neoplasm of bone: Secondary | ICD-10-CM

## 2014-10-29 DIAGNOSIS — Z171 Estrogen receptor negative status [ER-]: Secondary | ICD-10-CM

## 2014-10-29 DIAGNOSIS — Z5111 Encounter for antineoplastic chemotherapy: Secondary | ICD-10-CM | POA: Diagnosis not present

## 2014-10-29 DIAGNOSIS — C7800 Secondary malignant neoplasm of unspecified lung: Secondary | ICD-10-CM

## 2014-10-29 DIAGNOSIS — R0602 Shortness of breath: Secondary | ICD-10-CM

## 2014-10-29 LAB — COMPREHENSIVE METABOLIC PANEL (CC13)
ALT: 13 U/L (ref 0–55)
AST: 13 U/L (ref 5–34)
Albumin: 2.5 g/dL — ABNORMAL LOW (ref 3.5–5.0)
Alkaline Phosphatase: 78 U/L (ref 40–150)
Anion Gap: 7 mEq/L (ref 3–11)
BUN: 12.6 mg/dL (ref 7.0–26.0)
CO2: 25 meq/L (ref 22–29)
Calcium: 9.2 mg/dL (ref 8.4–10.4)
Chloride: 107 mEq/L (ref 98–109)
Creatinine: 0.7 mg/dL (ref 0.6–1.1)
EGFR: 88 mL/min/{1.73_m2} — AB (ref >90)
GLUCOSE: 146 mg/dL — AB (ref 70–140)
POTASSIUM: 4.3 meq/L (ref 3.5–5.1)
SODIUM: 138 meq/L (ref 136–145)
Total Bilirubin: 0.43 mg/dL (ref 0.20–1.20)
Total Protein: 6.1 g/dL — ABNORMAL LOW (ref 6.4–8.3)

## 2014-10-29 LAB — CBC WITH DIFFERENTIAL/PLATELET
BASO%: 0.4 % (ref 0.0–2.0)
BASOS ABS: 0 10*3/uL (ref 0.0–0.1)
EOS ABS: 0.1 10*3/uL (ref 0.0–0.5)
EOS%: 1.3 % (ref 0.0–7.0)
HCT: 36.1 % (ref 34.8–46.6)
HEMOGLOBIN: 11.4 g/dL — AB (ref 11.6–15.9)
LYMPH%: 29.6 % (ref 14.0–49.7)
MCH: 28.1 pg (ref 25.1–34.0)
MCHC: 31.6 g/dL (ref 31.5–36.0)
MCV: 89.1 fL (ref 79.5–101.0)
MONO#: 0.4 10*3/uL (ref 0.1–0.9)
MONO%: 8.5 % (ref 0.0–14.0)
NEUT%: 60.2 % (ref 38.4–76.8)
NEUTROS ABS: 2.9 10*3/uL (ref 1.5–6.5)
Platelets: 322 10*3/uL (ref 145–400)
RBC: 4.05 10*6/uL (ref 3.70–5.45)
RDW: 18.9 % — AB (ref 11.2–14.5)
WBC: 4.8 10*3/uL (ref 3.9–10.3)
lymph#: 1.4 10*3/uL (ref 0.9–3.3)

## 2014-10-29 LAB — TECHNOLOGIST REVIEW

## 2014-10-29 MED ORDER — SODIUM CHLORIDE 0.9 % IV SOLN
Freq: Once | INTRAVENOUS | Status: AC
  Administered 2014-10-29: 15:00:00 via INTRAVENOUS

## 2014-10-29 MED ORDER — FAMOTIDINE IN NACL 20-0.9 MG/50ML-% IV SOLN
INTRAVENOUS | Status: AC
  Filled 2014-10-29: qty 50

## 2014-10-29 MED ORDER — DIPHENHYDRAMINE HCL 25 MG PO CAPS
25.0000 mg | ORAL_CAPSULE | Freq: Once | ORAL | Status: AC
  Administered 2014-10-29: 25 mg via ORAL

## 2014-10-29 MED ORDER — FAMOTIDINE IN NACL 20-0.9 MG/50ML-% IV SOLN
20.0000 mg | Freq: Once | INTRAVENOUS | Status: AC
  Administered 2014-10-29: 20 mg via INTRAVENOUS

## 2014-10-29 MED ORDER — HEPARIN SOD (PORK) LOCK FLUSH 100 UNIT/ML IV SOLN
500.0000 [IU] | Freq: Once | INTRAVENOUS | Status: AC | PRN
  Administered 2014-10-29: 500 [IU]
  Filled 2014-10-29: qty 5

## 2014-10-29 MED ORDER — PACLITAXEL CHEMO INJECTION 300 MG/50ML
80.0000 mg/m2 | Freq: Once | INTRAVENOUS | Status: AC
  Administered 2014-10-29: 174 mg via INTRAVENOUS
  Filled 2014-10-29: qty 29

## 2014-10-29 MED ORDER — DIPHENHYDRAMINE HCL 25 MG PO CAPS
ORAL_CAPSULE | ORAL | Status: AC
  Filled 2014-10-29: qty 1

## 2014-10-29 MED ORDER — SODIUM CHLORIDE 0.9 % IJ SOLN
10.0000 mL | INTRAMUSCULAR | Status: DC | PRN
  Administered 2014-10-29: 10 mL
  Filled 2014-10-29: qty 10

## 2014-10-29 MED ORDER — SODIUM CHLORIDE 0.9 % IV SOLN
Freq: Once | INTRAVENOUS | Status: AC
  Administered 2014-10-29: 16:00:00 via INTRAVENOUS
  Filled 2014-10-29: qty 4

## 2014-10-29 NOTE — Telephone Encounter (Signed)
Appointments made and avs printed for patient °

## 2014-10-29 NOTE — Progress Notes (Signed)
Patient Care Team: Seward Carol, MD as PCP - General (Internal Medicine)  DIAGNOSIS: No matching staging information was found for the patient.  SUMMARY OF ONCOLOGIC HISTORY:   Breast cancer of upper-outer quadrant of right female breast ([place] [place])   08/15/2014 Initial Diagnosis Right Breast Inflammatory invasive ductal cancer: 17 cm by ultrasound along with 13 cm ulceration, node also positive T4N1 (stage 3C) ER 0%, PR 0%, Her-2 Pending   08/22/2014 PET scan Metastatic disease. In addition to Right breast, hypermetabolic disease in Rt Supraclav LN, axilla and mediastinum, bulky disease in liver, Pulm Mets, Path fracture 9th rib   09/03/2014 -  Chemotherapy Herceptin Perjeta started 09/03/2014, Taxol weekly added 09/10/2014   10/17/2014 Imaging CT scans: Decrease in right axillary lymph node, decreased skin thickening of the right breast, decreased in bilateral pulmonary nodules, decrease in liver metastases    CHIEF COMPLIANT: Patient is here to discuss the results of CT scans  INTERVAL HISTORY: Denise Morton is a 65 year old with above-mentioned history of metastatic breast cancer with extensive lymph node, lung, liver metastases. She underwent chemotherapy with Taxol Herceptin Perjeta and after 3 cycles she had a CT chest abdomen pelvis and she is here today to discuss the results. The scans reveal excellent response to chemotherapy. There is marked reduction in the lung and liver metastases in addition to the right lymph node metastases.  REVIEW OF SYSTEMS:   Constitutional: Denies fevers, chills or abnormal weight loss Eyes: Denies blurriness of vision Ears, nose, mouth, throat, and face: Denies mucositis or sore throat Respiratory: Denies cough, dyspnea or wheezes Cardiovascular: Denies palpitation, chest discomfort or lower extremity swelling Gastrointestinal:  Denies nausea, heartburn or change in bowel habits Skin: Denies abnormal skin rashes Lymphatics: Denies new lymphadenopathy or easy  bruising Neurological:Denies numbness, tingling or new weaknesses Behavioral/Psych: Mood is stable, no new changes  Breast: Decrease in the breast lump and skin changes All other systems were reviewed with the patient and are negative.  I have reviewed the past medical history, past surgical history, social history and family history with the patient and they are unchanged from previous note.  ALLERGIES:  is allergic to penicillin g and sulfa antibiotics.  MEDICATIONS:  Current Outpatient Prescriptions  Medication Sig Dispense Refill  . ALPRAZolam (XANAX) 0.25 MG tablet Take 0.25 mg by mouth daily as needed for anxiety.    . carvedilol (COREG) 6.25 MG tablet Take 1 tablet (6.25 mg total) by mouth 2 (two) times daily with a meal. 60 tablet 3  . lidocaine-prilocaine (EMLA) cream Apply to port 1-2 hours before procedure 30 g 1  . loperamide (IMODIUM) 2 MG capsule Take 2 mg by mouth as needed for diarrhea or loose stools.    . Multiple Vitamin (MULTIVITAMIN WITH MINERALS) TABS tablet Take 1 tablet by mouth daily.    . nicotine polacrilex (COMMIT) 2 MG lozenge Take 2 mg by mouth as needed for smoking cessation.    . ondansetron (ZOFRAN) 8 MG tablet Take 1 tablet (8 mg total) by mouth 2 (two) times daily. Start the day after chemo for 2 days. Then take as needed for nausea or vomiting. 30 tablet 1  . prochlorperazine (COMPAZINE) 10 MG tablet Take 1 tablet (10 mg total) by mouth every 6 (six) hours as needed (Nausea or vomiting). 30 tablet 1  . spironolactone (ALDACTONE) 25 MG tablet Take 0.5 tablets (12.5 mg total) by mouth daily. 45 tablet 3   No current facility-administered medications for this visit.    PHYSICAL EXAMINATION: ECOG  PERFORMANCE STATUS: 2 - Symptomatic, <50% confined to bed  Filed Vitals:   10/29/14 1427  BP: 144/75  Pulse: 83  Temp: 99.3 F (37.4 C)  Resp: 18   Filed Weights   10/29/14 1427  Weight: 232 lb 11.2 oz (105.552 kg)    GENERAL:alert, no distress and  comfortable SKIN: skin color, texture, turgor are normal, no rashes or significant lesions EYES: normal, Conjunctiva are pink and non-injected, sclera clear OROPHARYNX:no exudate, no erythema and lips, buccal mucosa, and tongue normal  NECK: supple, thyroid normal size, non-tender, without nodularity LYMPH:  no palpable lymphadenopathy in the cervical, axillary or inguinal LUNGS: clear to auscultation and percussion with normal breathing effort HEART: regular rate & rhythm and no murmurs and no lower extremity edema ABDOMEN:abdomen soft, non-tender and normal bowel sounds Musculoskeletal:no cyanosis of digits and no clubbing  NEURO: alert & oriented x 3 with fluent speech, no focal motor/sensory deficits 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 cannot draw labs just to have LABORATORY DATA:  I have reviewed the data as listed   Chemistry      Component Value Date/Time   NA 138 10/29/2014 1402   NA 140 12/03/2006 1854   K 4.3 10/29/2014 1402   K 3.8 12/03/2006 1854   CL 108 12/03/2006 1854   CO2 25 10/29/2014 1402   BUN 12.6 10/29/2014 1402   BUN 21 12/03/2006 1854   CREATININE 0.7 10/29/2014 1402   CREATININE 1.0 12/03/2006 1854      Component Value Date/Time   CALCIUM 9.2 10/29/2014 1402   ALKPHOS 78 10/29/2014 1402   AST 13 10/29/2014 1402   ALT 13 10/29/2014 1402   BILITOT 0.43 10/29/2014 1402       Lab Results  Component Value Date   WBC 4.8 10/29/2014   HGB 11.4* 10/29/2014   HCT 36.1 10/29/2014   MCV 89.1 10/29/2014   PLT 322 10/29/2014   NEUTROABS 2.9 10/29/2014    ASSESSMENT & PLAN:  Breast cancer of 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 In addition to Right breast, hypermetabolic disease in Rt Supraclav LN, axilla and mediastinum,  bulky disease in liver, Pulm Mets, Path fracture 9th rib  Treatment plan: Taxol weekly(started 09/10/2014 )Herceptin Perjeta every 3 weeks(started 09/03/2014)  Current treatment: Today is Week 9 Taxol, Herceptin Perjeta being given once every 3 weeks (8/17 and 9/7 and 9/28)  Chemotoxicities: 1. Severe drowsiness related to Benadryl during infusion: Discontinued IV Benadryl, now takes 25 mg by mouth Benadryl 2. Diarrhea 1 3. Mouth sores: Prescribed acyclovir: Resolved  Response to chemotherapy: CT chest abdomen pelvis 10/17/2014: Interval decrease in size of right axillary lymphadenopathy, many lymph nodes have resolved, reduction and skin thickening of the right breast. Reduction in the pulmonary nodules decreased from 15 mm to 8 mm right upper lobe, in the left lower lobe 14 mm to 8 mm CT abdomen and pelvis: Left hepatic lobe metastasis is decreased from 9.7 cm to 6.7 cm, inferior right hepatic lobe lesion decreased from 24 mm to 13 mm, central left hepatic lobe lesion decreased from 31 mm to 17 mm   Shortness of breath or exertion: Physical examination of the chest does not reveal any longer heart dysfunction. I discussed with her that her symptoms could be related to fatigue. She  will have another echocardiogram first week of November.  Return to clinic with the next cycle of chemotherapy.    No orders of the defined types were placed in this encounter.   The patient has a good understanding of the overall plan. she agrees with it. she will call with any problems that may develop before the next visit here.   Rulon Eisenmenger, MD 10/29/2014

## 2014-10-31 ENCOUNTER — Telehealth: Payer: Self-pay | Admitting: Hematology and Oncology

## 2014-10-31 ENCOUNTER — Other Ambulatory Visit: Payer: Self-pay | Admitting: *Deleted

## 2014-10-31 DIAGNOSIS — C50411 Malignant neoplasm of upper-outer quadrant of right female breast: Secondary | ICD-10-CM

## 2014-10-31 NOTE — Telephone Encounter (Signed)
pof noted and patient is on recall for heart fail clinic md visit and echo

## 2014-11-05 ENCOUNTER — Other Ambulatory Visit: Payer: Self-pay | Admitting: Hematology and Oncology

## 2014-11-05 ENCOUNTER — Other Ambulatory Visit (HOSPITAL_BASED_OUTPATIENT_CLINIC_OR_DEPARTMENT_OTHER): Payer: 59

## 2014-11-05 ENCOUNTER — Ambulatory Visit (HOSPITAL_BASED_OUTPATIENT_CLINIC_OR_DEPARTMENT_OTHER): Payer: 59

## 2014-11-05 VITALS — BP 146/73 | HR 83 | Temp 97.9°F

## 2014-11-05 DIAGNOSIS — C50411 Malignant neoplasm of upper-outer quadrant of right female breast: Secondary | ICD-10-CM | POA: Diagnosis not present

## 2014-11-05 DIAGNOSIS — C787 Secondary malignant neoplasm of liver and intrahepatic bile duct: Secondary | ICD-10-CM

## 2014-11-05 DIAGNOSIS — C778 Secondary and unspecified malignant neoplasm of lymph nodes of multiple regions: Secondary | ICD-10-CM | POA: Diagnosis not present

## 2014-11-05 DIAGNOSIS — Z5112 Encounter for antineoplastic immunotherapy: Secondary | ICD-10-CM | POA: Diagnosis not present

## 2014-11-05 DIAGNOSIS — Z5111 Encounter for antineoplastic chemotherapy: Secondary | ICD-10-CM | POA: Diagnosis not present

## 2014-11-05 LAB — CBC WITH DIFFERENTIAL/PLATELET
BASO%: 0.7 % (ref 0.0–2.0)
BASOS ABS: 0 10*3/uL (ref 0.0–0.1)
EOS ABS: 0.1 10*3/uL (ref 0.0–0.5)
EOS%: 1.5 % (ref 0.0–7.0)
HEMATOCRIT: 36 % (ref 34.8–46.6)
HGB: 11.2 g/dL — ABNORMAL LOW (ref 11.6–15.9)
LYMPH%: 26.3 % (ref 14.0–49.7)
MCH: 27.7 pg (ref 25.1–34.0)
MCHC: 31.1 g/dL — AB (ref 31.5–36.0)
MCV: 89.1 fL (ref 79.5–101.0)
MONO#: 0.4 10*3/uL (ref 0.1–0.9)
MONO%: 8.6 % (ref 0.0–14.0)
NEUT#: 2.9 10*3/uL (ref 1.5–6.5)
NEUT%: 62.9 % (ref 38.4–76.8)
PLATELETS: 368 10*3/uL (ref 145–400)
RBC: 4.04 10*6/uL (ref 3.70–5.45)
RDW: 19.3 % — ABNORMAL HIGH (ref 11.2–14.5)
WBC: 4.5 10*3/uL (ref 3.9–10.3)
lymph#: 1.2 10*3/uL (ref 0.9–3.3)
nRBC: 0 % (ref 0–0)

## 2014-11-05 LAB — COMPREHENSIVE METABOLIC PANEL (CC13)
ALK PHOS: 83 U/L (ref 40–150)
ALT: 14 U/L (ref 0–55)
ANION GAP: 8 meq/L (ref 3–11)
AST: 12 U/L (ref 5–34)
Albumin: 2.4 g/dL — ABNORMAL LOW (ref 3.5–5.0)
BUN: 8.7 mg/dL (ref 7.0–26.0)
CALCIUM: 9.2 mg/dL (ref 8.4–10.4)
CHLORIDE: 106 meq/L (ref 98–109)
CO2: 23 mEq/L (ref 22–29)
CREATININE: 0.8 mg/dL (ref 0.6–1.1)
EGFR: 79 mL/min/{1.73_m2} — ABNORMAL LOW (ref >90)
Glucose: 264 mg/dl — ABNORMAL HIGH (ref 70–140)
POTASSIUM: 4.1 meq/L (ref 3.5–5.1)
Sodium: 137 mEq/L (ref 136–145)
Total Bilirubin: 0.38 mg/dL (ref 0.20–1.20)
Total Protein: 6.1 g/dL — ABNORMAL LOW (ref 6.4–8.3)

## 2014-11-05 LAB — TECHNOLOGIST REVIEW

## 2014-11-05 MED ORDER — SODIUM CHLORIDE 0.9 % IV SOLN
6.0000 mg/kg | Freq: Once | INTRAVENOUS | Status: AC
  Administered 2014-11-05: 630 mg via INTRAVENOUS
  Filled 2014-11-05: qty 30

## 2014-11-05 MED ORDER — PACLITAXEL CHEMO INJECTION 300 MG/50ML
65.0000 mg/m2 | Freq: Once | INTRAVENOUS | Status: AC
  Administered 2014-11-05: 144 mg via INTRAVENOUS
  Filled 2014-11-05: qty 24

## 2014-11-05 MED ORDER — FAMOTIDINE IN NACL 20-0.9 MG/50ML-% IV SOLN
20.0000 mg | Freq: Once | INTRAVENOUS | Status: AC
Start: 2014-11-05 — End: 2014-11-05
  Administered 2014-11-05: 20 mg via INTRAVENOUS

## 2014-11-05 MED ORDER — ACETAMINOPHEN 325 MG PO TABS
650.0000 mg | ORAL_TABLET | Freq: Once | ORAL | Status: AC
  Administered 2014-11-05: 650 mg via ORAL

## 2014-11-05 MED ORDER — FAMOTIDINE IN NACL 20-0.9 MG/50ML-% IV SOLN
INTRAVENOUS | Status: AC
  Filled 2014-11-05: qty 50

## 2014-11-05 MED ORDER — SODIUM CHLORIDE 0.9 % IV SOLN
420.0000 mg | Freq: Once | INTRAVENOUS | Status: AC
  Administered 2014-11-05: 420 mg via INTRAVENOUS
  Filled 2014-11-05: qty 14

## 2014-11-05 MED ORDER — HEPARIN SOD (PORK) LOCK FLUSH 100 UNIT/ML IV SOLN
500.0000 [IU] | Freq: Once | INTRAVENOUS | Status: AC | PRN
Start: 2014-11-05 — End: 2014-11-05
  Administered 2014-11-05: 500 [IU]
  Filled 2014-11-05: qty 5

## 2014-11-05 MED ORDER — SODIUM CHLORIDE 0.9 % IV SOLN
Freq: Once | INTRAVENOUS | Status: AC
  Administered 2014-11-05: 10:00:00 via INTRAVENOUS

## 2014-11-05 MED ORDER — SODIUM CHLORIDE 0.9 % IJ SOLN
10.0000 mL | INTRAMUSCULAR | Status: DC | PRN
  Administered 2014-11-05: 10 mL
  Filled 2014-11-05: qty 10

## 2014-11-05 MED ORDER — DIPHENHYDRAMINE HCL 25 MG PO CAPS
25.0000 mg | ORAL_CAPSULE | Freq: Once | ORAL | Status: AC
  Administered 2014-11-05: 25 mg via ORAL

## 2014-11-05 MED ORDER — SODIUM CHLORIDE 0.9 % IV SOLN
Freq: Once | INTRAVENOUS | Status: AC
  Administered 2014-11-05: 12:00:00 via INTRAVENOUS
  Filled 2014-11-05: qty 4

## 2014-11-05 NOTE — Patient Instructions (Signed)
[facility] Discharge Instructions for Patients Receiving Chemotherapy  Today you received the following chemotherapy agents: Herceptin, Perjeta and Taxol   To help prevent nausea and vomiting after your treatment, we encourage you to take your nausea medication as directed.    If you develop nausea and vomiting that is not controlled by your nausea medication, call the clinic.   BELOW ARE SYMPTOMS THAT SHOULD BE REPORTED IMMEDIATELY:  *FEVER GREATER THAN 100.5 F  *CHILLS WITH OR WITHOUT FEVER  NAUSEA AND VOMITING THAT IS NOT CONTROLLED WITH YOUR NAUSEA MEDICATION  *UNUSUAL SHORTNESS OF BREATH  *UNUSUAL BRUISING OR BLEEDING  TENDERNESS IN MOUTH AND THROAT WITH OR WITHOUT PRESENCE OF ULCERS  *URINARY PROBLEMS  *BOWEL PROBLEMS  UNUSUAL RASH Items with * indicate a potential emergency and should be followed up as soon as possible.  Feel free to call the clinic you have any questions or concerns. The clinic phone number is [phone].  Please show the CHEMO ALERT CARD at check-in to the Emergency Department and triage nurse.   

## 2014-11-05 NOTE — Progress Notes (Signed)
Pt reported increased SOB w/ exertion over the past week.  Denies fevers or productive cough.  She also reports new numbness and tingling in hands and feets. States does not interfere w/ ability to use hands, button shirts, etc..  Dr. Lindi Adie notified and instructed for RN to proceed w/ chemotherapy today as ordered.

## 2014-11-11 NOTE — Assessment & Plan Note (Signed)
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 In addition to Right breast, hypermetabolic disease in Rt Supraclav LN, axilla and mediastinum, bulky disease in liver, Pulm Mets, Path fracture 9th rib  Treatment plan: Taxol weekly(started 09/10/2014 )Herceptin Perjeta every 3 weeks(started 09/03/2014)  Current treatment: Today is Week 9 Taxol, Herceptin Perjeta being given once every 3 weeks (8/17 and 9/7 and 9/28)  Chemotoxicities:  1. Severe drowsiness related to Benadryl during infusion: Discontinued IV Benadryl, now takes 25 mg by mouth Benadryl  2. Diarrhea 1  3. Mouth sores: Prescribed acyclovir: Resolved   Response to chemotherapy: CT chest abdomen pelvis 10/17/2014: Interval decrease in size of right axillary lymphadenopathy, many lymph nodes have resolved, reduction and skin thickening of the right breast, dec in lung nodules

## 2014-11-12 ENCOUNTER — Other Ambulatory Visit (HOSPITAL_BASED_OUTPATIENT_CLINIC_OR_DEPARTMENT_OTHER): Payer: 59

## 2014-11-12 ENCOUNTER — Ambulatory Visit: Payer: 59

## 2014-11-12 ENCOUNTER — Other Ambulatory Visit: Payer: 59

## 2014-11-12 ENCOUNTER — Telehealth: Payer: Self-pay | Admitting: Hematology and Oncology

## 2014-11-12 ENCOUNTER — Ambulatory Visit (HOSPITAL_BASED_OUTPATIENT_CLINIC_OR_DEPARTMENT_OTHER): Payer: 59 | Admitting: Hematology and Oncology

## 2014-11-12 ENCOUNTER — Encounter: Payer: Self-pay | Admitting: Hematology and Oncology

## 2014-11-12 VITALS — BP 137/96 | HR 95 | Temp 97.8°F | Resp 20 | Ht 65.5 in | Wt 228.3 lb

## 2014-11-12 DIAGNOSIS — C7802 Secondary malignant neoplasm of left lung: Secondary | ICD-10-CM

## 2014-11-12 DIAGNOSIS — C778 Secondary and unspecified malignant neoplasm of lymph nodes of multiple regions: Secondary | ICD-10-CM

## 2014-11-12 DIAGNOSIS — C7801 Secondary malignant neoplasm of right lung: Secondary | ICD-10-CM

## 2014-11-12 DIAGNOSIS — Z171 Estrogen receptor negative status [ER-]: Secondary | ICD-10-CM

## 2014-11-12 DIAGNOSIS — C7951 Secondary malignant neoplasm of bone: Secondary | ICD-10-CM

## 2014-11-12 DIAGNOSIS — C50411 Malignant neoplasm of upper-outer quadrant of right female breast: Secondary | ICD-10-CM

## 2014-11-12 DIAGNOSIS — G620 Drug-induced polyneuropathy: Secondary | ICD-10-CM

## 2014-11-12 DIAGNOSIS — C787 Secondary malignant neoplasm of liver and intrahepatic bile duct: Secondary | ICD-10-CM | POA: Diagnosis not present

## 2014-11-12 DIAGNOSIS — R197 Diarrhea, unspecified: Secondary | ICD-10-CM

## 2014-11-12 LAB — CBC WITH DIFFERENTIAL/PLATELET
BASO%: 0.6 % (ref 0.0–2.0)
BASOS ABS: 0 10*3/uL (ref 0.0–0.1)
EOS%: 1.2 % (ref 0.0–7.0)
Eosinophils Absolute: 0.1 10*3/uL (ref 0.0–0.5)
HEMATOCRIT: 37.7 % (ref 34.8–46.6)
HEMOGLOBIN: 11.7 g/dL (ref 11.6–15.9)
LYMPH#: 1.5 10*3/uL (ref 0.9–3.3)
LYMPH%: 29.8 % (ref 14.0–49.7)
MCH: 27.7 pg (ref 25.1–34.0)
MCHC: 31 g/dL — ABNORMAL LOW (ref 31.5–36.0)
MCV: 89.3 fL (ref 79.5–101.0)
MONO#: 0.5 10*3/uL (ref 0.1–0.9)
MONO%: 9.6 % (ref 0.0–14.0)
NEUT#: 2.9 10*3/uL (ref 1.5–6.5)
NEUT%: 58.8 % (ref 38.4–76.8)
Platelets: 402 10*3/uL — ABNORMAL HIGH (ref 145–400)
RBC: 4.22 10*6/uL (ref 3.70–5.45)
RDW: 20 % — AB (ref 11.2–14.5)
WBC: 4.9 10*3/uL (ref 3.9–10.3)

## 2014-11-12 LAB — COMPREHENSIVE METABOLIC PANEL (CC13)
ALT: 17 U/L (ref 0–55)
AST: 14 U/L (ref 5–34)
Albumin: 2.6 g/dL — ABNORMAL LOW (ref 3.5–5.0)
Alkaline Phosphatase: 85 U/L (ref 40–150)
Anion Gap: 7 mEq/L (ref 3–11)
BUN: 10.3 mg/dL (ref 7.0–26.0)
CALCIUM: 9.1 mg/dL (ref 8.4–10.4)
CHLORIDE: 107 meq/L (ref 98–109)
CO2: 22 mEq/L (ref 22–29)
CREATININE: 0.8 mg/dL (ref 0.6–1.1)
EGFR: 81 mL/min/{1.73_m2} — AB (ref >90)
Glucose: 164 mg/dl — ABNORMAL HIGH (ref 70–140)
POTASSIUM: 4.5 meq/L (ref 3.5–5.1)
Sodium: 136 mEq/L (ref 136–145)
Total Bilirubin: 0.35 mg/dL (ref 0.20–1.20)
Total Protein: 6.4 g/dL (ref 6.4–8.3)

## 2014-11-12 LAB — TECHNOLOGIST REVIEW

## 2014-11-12 NOTE — Telephone Encounter (Signed)
Appointments made and avs printed for pateint °

## 2014-11-12 NOTE — Progress Notes (Signed)
Patient Care Team: Seward Carol, MD as PCP - General (Internal Medicine)  DIAGNOSIS: No matching staging information was found for the patient.  SUMMARY OF ONCOLOGIC HISTORY:   Breast cancer of upper-outer quadrant of right female breast ([place])   08/15/2014 Initial Diagnosis Right Breast Inflammatory invasive ductal cancer: 17 cm by ultrasound along with 13 cm ulceration, node also positive T4N1 (stage 3C) ER 0%, PR 0%, Her-2 Pending   08/22/2014 PET scan Metastatic disease. In addition to Right breast, hypermetabolic disease in Rt [address]raclav LN, axilla and mediastinum, bulky disease in liver, Pulm Mets, Path fracture 9th rib   09/03/2014 -  Chemotherapy Herceptin Perjeta started 09/03/2014, Taxol weekly added 09/10/2014   10/17/2014 Imaging CT scans: Decrease in right axillary lymph node, decreased skin thickening of the right breast, decreased in bilateral pulmonary nodules, decrease in liver metastases    CHIEF COMPLIANT: Bronchospasm from Abraxane causing shortness of breath, newly diagnosed diabetes and cannot take steroids  INTERVAL HISTORY: Denise Morton is a 64 year old with above-mentioned history of right breast cancer with metastatic disease currently on palliative chemotherapy with Taxol Herceptin Perjeta. With the last Taxol treatment she started having difficulty breathing. She was also diagnosed with type 2 diabetes. Because of both of these reasons, I do not want to continue with the Taxol treatment. We will request insurance authorization for Abraxane instead.  REVIEW OF SYSTEMS:   Constitutional: Denies fevers, chills or abnormal weight loss Eyes: Denies blurriness of vision Ears, nose, mouth, throat, and face: Denies mucositis or sore throat Respiratory: Shortness of breath and wheezing Cardiovascular: Denies palpitation, chest discomfort or lower extremity swelling Gastrointestinal:  Denies nausea, heartburn or change in bowel habits Skin: Denies abnormal skin  rashes Lymphatics: Denies new lymphadenopathy or easy bruising Neurological: Neuropathy grade 1 Behavioral/Psych: Mood is stable, no new changes  All other systems were reviewed with the patient and are negative.  I have reviewed the past medical history, past surgical history, social history and family history with the patient and they are unchanged from previous note.  ALLERGIES:  is allergic to penicillin g and sulfa antibiotics.  MEDICATIONS:  Current Outpatient Prescriptions  Medication Sig Dispense Refill  . ALPRAZolam (XANAX) 0.25 MG tablet Take 0.25 mg by mouth daily as needed for anxiety.    . carvedilol (COREG) 6.25 MG tablet Take 1 tablet (6.25 mg total) by mouth 2 (two) times daily with a meal. 60 tablet 3  . lidocaine-prilocaine (EMLA) cream Apply to port 1-2 hours before procedure 30 g 1  . loperamide (IMODIUM) 2 MG capsule Take 2 mg by mouth as needed for diarrhea or loose stools.    . Multiple Vitamin (MULTIVITAMIN WITH MINERALS) TABS tablet Take 1 tablet by mouth daily.    . nicotine polacrilex (COMMIT) 2 MG lozenge Take 2 mg by mouth as needed for smoking cessation.    . ondansetron (ZOFRAN) 8 MG tablet Take 1 tablet (8 mg total) by mouth 2 (two) times daily. Start the day after chemo for 2 days. Then take as needed for nausea or vomiting. 30 tablet 1  . prochlorperazine (COMPAZINE) 10 MG tablet Take 1 tablet (10 mg total) by mouth every 6 (six) hours as needed (Nausea or vomiting). 30 tablet 1  . spironolactone (ALDACTONE) 25 MG tablet Take 0.5 tablets (12.5 mg total) by mouth daily. 45 tablet 3   No current facility-administered medications for this visit.    PHYSICAL EXAMINATION: ECOG PERFORMANCE STATUS: 1 - Symptomatic but completely ambulatory  There were no vitals  filed for this visit. There were no vitals filed for this visit.  GENERAL:alert, no distress and comfortable SKIN: skin color, texture, turgor are normal, no rashes or significant lesions EYES:  normal, Conjunctiva are pink and non-injected, sclera clear OROPHARYNX:no exudate, no erythema and lips, buccal mucosa, and tongue normal  NECK: supple, thyroid normal size, non-tender, without nodularity LYMPH:  no palpable lymphadenopathy in the cervical, axillary or inguinal LUNGS: Bibasilar wheezes HEART: regular rate & rhythm and no murmurs and no lower extremity edema ABDOMEN:abdomen soft, non-tender and normal bowel sounds Musculoskeletal:no cyanosis of digits and no clubbing  NEURO: Neuropathy grade 1   LABORATORY DATA:  I have reviewed the data as listed   Chemistry      Component Value Date/Time   NA 137 11/05/2014 0845   NA 140 12/03/2006 1854   K 4.1 11/05/2014 0845   K 3.8 12/03/2006 1854   CL 108 12/03/2006 1854   CO2 23 11/05/2014 0845   BUN 8.7 11/05/2014 0845   BUN 21 12/03/2006 1854   CREATININE 0.8 11/05/2014 0845   CREATININE 1.0 12/03/2006 1854      Component Value Date/Time   CALCIUM 9.2 11/05/2014 0845   ALKPHOS 83 11/05/2014 0845   AST 12 11/05/2014 0845   ALT 14 11/05/2014 0845   BILITOT 0.38 11/05/2014 0845       Lab Results  Component Value Date   WBC 4.9 11/12/2014   HGB 11.7 11/12/2014   HCT 37.7 11/12/2014   MCV 89.3 11/12/2014   PLT 402* 11/12/2014   NEUTROABS 2.9 11/12/2014   ASSESSMENT & PLAN:  Breast cancer of 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 In addition to Right breast, hypermetabolic disease in Rt [address]raclav LN, axilla and mediastinum, bulky disease in liver, Pulm Mets, Path fracture 9th rib  Treatment plan: Taxol weekly(started 09/10/2014 )Herceptin Perjeta every 3 weeks(started 09/03/2014)   Current treatment: Received 10 Weeks Taxol, Herceptin Perjeta being given once every 3 weeks (8/17 and 9/7  and 9/28) ; holding Taxol for bronchospasm along with newly diagnosed diabetes and prefer not to take steroids   Response to chemotherapy: CT chest abdomen pelvis 10/17/2014: Interval decrease in size of right axillary lymphadenopathy, many lymph nodes have resolved, reduction and skin thickening of the right breast, dec in lung nodules  Chemotoxicities:  1. Severe drowsiness related to Benadryl during infusion: Discontinued IV Benadryl, now takes 25 mg by mouth Benadryl  2. Diarrhea 1  3. Mouth sores: Prescribed acyclovir: Resolved  4. 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 I will switch her to Abraxane. 5. Shortness of breath to exertion accompanied by wheezing related to Taxol: We will request insurance authorization to switch her to Abraxane.  Plan: Return to clinic 1 week for Abraxane. I will see her back in 2 weeks for follow-up. Our plan is to obtain a PET CT scan after finishing 12 cycles of chemotherapy with the weekly taxanes.    No orders of the defined types were placed in this encounter.   The patient has a good understanding of the overall plan. she agrees with it. she will call with any problems that may develop before the next visit here.   Rulon Eisenmenger, MD 11/12/2014

## 2014-11-19 ENCOUNTER — Ambulatory Visit (HOSPITAL_BASED_OUTPATIENT_CLINIC_OR_DEPARTMENT_OTHER): Payer: 59

## 2014-11-19 ENCOUNTER — Other Ambulatory Visit (HOSPITAL_BASED_OUTPATIENT_CLINIC_OR_DEPARTMENT_OTHER): Payer: 59

## 2014-11-19 VITALS — BP 126/83 | HR 80 | Temp 98.3°F | Resp 16

## 2014-11-19 DIAGNOSIS — Z5111 Encounter for antineoplastic chemotherapy: Secondary | ICD-10-CM | POA: Diagnosis not present

## 2014-11-19 DIAGNOSIS — C50411 Malignant neoplasm of upper-outer quadrant of right female breast: Secondary | ICD-10-CM

## 2014-11-19 LAB — COMPREHENSIVE METABOLIC PANEL (CC13)
ALBUMIN: 2.5 g/dL — AB (ref 3.5–5.0)
ALK PHOS: 82 U/L (ref 40–150)
ALT: 14 U/L (ref 0–55)
ANION GAP: 8 meq/L (ref 3–11)
AST: 14 U/L (ref 5–34)
BILIRUBIN TOTAL: 0.48 mg/dL (ref 0.20–1.20)
BUN: 11 mg/dL (ref 7.0–26.0)
CO2: 23 meq/L (ref 22–29)
Calcium: 9.4 mg/dL (ref 8.4–10.4)
Chloride: 107 mEq/L (ref 98–109)
Creatinine: 0.7 mg/dL (ref 0.6–1.1)
EGFR: 87 mL/min/{1.73_m2} — AB (ref >90)
GLUCOSE: 162 mg/dL — AB (ref 70–140)
POTASSIUM: 4.2 meq/L (ref 3.5–5.1)
SODIUM: 138 meq/L (ref 136–145)
TOTAL PROTEIN: 6.2 g/dL — AB (ref 6.4–8.3)

## 2014-11-19 LAB — CBC WITH DIFFERENTIAL/PLATELET
BASO%: 0.1 % (ref 0.0–2.0)
BASOS ABS: 0 10*3/uL (ref 0.0–0.1)
EOS ABS: 0 10*3/uL (ref 0.0–0.5)
EOS%: 0.6 % (ref 0.0–7.0)
HCT: 36.9 % (ref 34.8–46.6)
HGB: 11.6 g/dL (ref 11.6–15.9)
LYMPH%: 22.7 % (ref 14.0–49.7)
MCH: 26.9 pg (ref 25.1–34.0)
MCHC: 31.6 g/dL (ref 31.5–36.0)
MCV: 85.3 fL (ref 79.5–101.0)
MONO#: 0.6 10*3/uL (ref 0.1–0.9)
MONO%: 9.5 % (ref 0.0–14.0)
NEUT%: 67.1 % (ref 38.4–76.8)
NEUTROS ABS: 4.3 10*3/uL (ref 1.5–6.5)
PLATELETS: 331 10*3/uL (ref 145–400)
RBC: 4.32 10*6/uL (ref 3.70–5.45)
RDW: 21.1 % — ABNORMAL HIGH (ref 11.2–14.5)
WBC: 6.4 10*3/uL (ref 3.9–10.3)
lymph#: 1.4 10*3/uL (ref 0.9–3.3)

## 2014-11-19 MED ORDER — PALONOSETRON HCL INJECTION 0.25 MG/5ML
INTRAVENOUS | Status: AC
  Filled 2014-11-19: qty 5

## 2014-11-19 MED ORDER — SODIUM CHLORIDE 0.9 % IV SOLN
Freq: Once | INTRAVENOUS | Status: AC
  Administered 2014-11-19: 10:00:00 via INTRAVENOUS

## 2014-11-19 MED ORDER — PACLITAXEL PROTEIN-BOUND CHEMO INJECTION 100 MG
80.0000 mg/m2 | Freq: Once | INTRAVENOUS | Status: AC
  Administered 2014-11-19: 175 mg via INTRAVENOUS
  Filled 2014-11-19: qty 35

## 2014-11-19 MED ORDER — HEPARIN SOD (PORK) LOCK FLUSH 100 UNIT/ML IV SOLN
500.0000 [IU] | Freq: Once | INTRAVENOUS | Status: AC | PRN
  Administered 2014-11-19: 500 [IU]
  Filled 2014-11-19: qty 5

## 2014-11-19 MED ORDER — PALONOSETRON HCL INJECTION 0.25 MG/5ML
0.2500 mg | Freq: Once | INTRAVENOUS | Status: AC
  Administered 2014-11-19: 0.25 mg via INTRAVENOUS

## 2014-11-19 MED ORDER — SODIUM CHLORIDE 0.9 % IJ SOLN
10.0000 mL | INTRAMUSCULAR | Status: DC | PRN
  Administered 2014-11-19: 10 mL
  Filled 2014-11-19: qty 10

## 2014-11-19 NOTE — Patient Instructions (Signed)
[facility] Discharge Instructions for Patients Receiving Chemotherapy  Today you received the following chemotherapy agents; abraxane.  To help prevent nausea and vomiting after your treatment, we encourage you to take your nausea medication as directed.    If you develop nausea and vomiting that is not controlled by your nausea medication, call the clinic.   BELOW ARE SYMPTOMS THAT SHOULD BE REPORTED IMMEDIATELY:  *FEVER GREATER THAN 100.5 F  *CHILLS WITH OR WITHOUT FEVER  NAUSEA AND VOMITING THAT IS NOT CONTROLLED WITH YOUR NAUSEA MEDICATION  *UNUSUAL SHORTNESS OF BREATH  *UNUSUAL BRUISING OR BLEEDING  TENDERNESS IN MOUTH AND THROAT WITH OR WITHOUT PRESENCE OF ULCERS  *URINARY PROBLEMS  *BOWEL PROBLEMS  UNUSUAL RASH Items with * indicate a potential emergency and should be followed up as soon as possible.  Feel free to call the clinic you have any questions or concerns. The clinic phone number is [phone].  Please show the [place] at check-in to the Emergency Department and triage nurse. Nanoparticle Albumin-Bound Paclitaxel injection What is this medicine? NANOPARTICLE ALBUMIN-BOUND PACLITAXEL (Na no PAHR ti kuhl al BYOO muhn-bound PAK li TAX el) is a chemotherapy drug. It targets fast dividing cells, like cancer cells, and causes these cells to die. This medicine is used to treat advanced breast cancer and advanced lung cancer. This medicine may be used for other purposes; ask your health care provider or pharmacist if you have questions. What should I tell my health care provider before I take this medicine? They need to know if you have any of these conditions: -kidney disease -liver disease -low blood counts, like low platelets, red blood cells, or white blood cells -recent or ongoing radiation therapy -an unusual or allergic reaction to paclitaxel, albumin, other chemotherapy, other medicines, foods, dyes, or preservatives -pregnant or  trying to get pregnant -breast-feeding How should I use this medicine? This drug is given as an infusion into a vein. It is administered in a hospital or clinic by a specially trained health care professional. Talk to your pediatrician regarding the use of this medicine in children. Special care may be needed. Overdosage: If you think you have taken too much of this medicine contact a poison control center or emergency room at once. NOTE: This medicine is only for you. Do not share this medicine with others. What if I miss a dose? It is important not to miss your dose. Call your doctor or health care professional if you are unable to keep an appointment. What may interact with this medicine? -cyclosporine -diazepam -ketoconazole -medicines to increase blood counts like filgrastim, pegfilgrastim, sargramostim -other chemotherapy drugs like cisplatin, doxorubicin, epirubicin, etoposide, teniposide, vincristine -quinidine -testosterone -vaccines -verapamil Talk to your doctor or health care professional before taking any of these medicines: -acetaminophen -aspirin -ibuprofen -ketoprofen -naproxen This list may not describe all possible interactions. Give your health care provider a list of all the medicines, herbs, non-prescription drugs, or dietary supplements you use. Also tell them if you smoke, drink alcohol, or use illegal drugs. Some items may interact with your medicine. What should I watch for while using this medicine? Your condition will be monitored carefully while you are receiving this medicine. You will need important blood work done while you are taking this medicine. This drug may make you feel generally unwell. This is not uncommon, as chemotherapy can affect healthy cells as well as cancer cells. Report any side effects. Continue your course of treatment even though you feel ill  unless your doctor tells you to stop. In some cases, you may be given additional medicines to  help with side effects. Follow all directions for their use. Call your doctor or health care professional for advice if you get a fever, chills or sore throat, or other symptoms of a cold or flu. Do not treat yourself. This drug decreases your body's ability to fight infections. Try to avoid being around people who are sick. This medicine may increase your risk to bruise or bleed. Call your doctor or health care professional if you notice any unusual bleeding. Be careful brushing and flossing your teeth or using a toothpick because you may get an infection or bleed more easily. If you have any dental work done, tell your dentist you are receiving this medicine. Avoid taking products that contain aspirin, acetaminophen, ibuprofen, naproxen, or ketoprofen unless instructed by your doctor. These medicines may hide a fever. Do not become pregnant while taking this medicine. Women should inform their doctor if they wish to become pregnant or think they might be pregnant. There is a potential for serious side effects to an unborn child. Talk to your health care professional or pharmacist for more information. Do not breast-feed an infant while taking this medicine. Men are advised not to father a child while receiving this medicine. What side effects may I notice from receiving this medicine? Side effects that you should report to your doctor or health care professional as soon as possible: -allergic reactions like skin rash, itching or hives, swelling of the face, lips, or tongue -low blood counts - This drug may decrease the number of white blood cells, red blood cells and platelets. You may be at increased risk for infections and bleeding. -signs of infection - fever or chills, cough, sore throat, pain or difficulty passing urine -signs of decreased platelets or bleeding - bruising, pinpoint red spots on the skin, black, tarry stools, nosebleeds -signs of decreased red blood cells - unusually weak or  tired, fainting spells, lightheadedness -breathing problems -changes in vision -chest pain -high or low blood pressure -mouth sores -nausea and vomiting -pain, swelling, redness or irritation at the injection site -pain, tingling, numbness in the hands or feet -slow or irregular heartbeat -swelling of the ankle, feet, hands Side effects that usually do not require medical attention (report to your doctor or health care professional if they continue or are bothersome): -aches, pains -changes in the color of fingernails -diarrhea -hair loss -loss of appetite This list may not describe all possible side effects. Call your doctor for medical advice about side effects. You may report side effects to FDA at 1-800-FDA-1088. Where should I keep my medicine? This drug is given in a hospital or clinic and will not be stored at home. NOTE: This sheet is a summary. It may not cover all possible information. If you have questions about this medicine, talk to your doctor, pharmacist, or health care provider.    2016, [employer]. (2012-02-27 16:48:50)

## 2014-11-20 ENCOUNTER — Telehealth: Payer: Self-pay

## 2014-11-20 NOTE — Telephone Encounter (Signed)
Patient reports no problems with new chemotherapy yesterday.  States she did sleep a lot, but does not necessarily attribute that to the chemo.  Reports no other problems.  Pt unable to remain on phone because "on line to vote right now,  Have to get off".  Advised pt to call clinic if she has any concerns questions or problems.  Pt voiced understanding.

## 2014-11-21 ENCOUNTER — Telehealth: Payer: Self-pay

## 2014-11-21 NOTE — Telephone Encounter (Signed)
Returned pt call re: shortness of breath.  Pt reports she continues to have shortness of breath with exertion.  It has not worsened since the last time she saw Dr. Lindi Adie.  Pt denies temps, chest pain, pain with inspiration/expiration, cough.  Advised pt it can take awhile for symptoms from the taxol to resolve, and that also the abraxane can cause dyspnea in 12% of patients.  Pt reports appt with Dr. Lindi Adie on Wednesday and wants to wait until then to see him.  Pt advised to go to ED immediately for any worsening symptoms.  Pt voiced understanding.

## 2014-11-25 NOTE — Assessment & Plan Note (Signed)
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 In addition to Right breast, hypermetabolic disease in Rt Supraclav LN, axilla and mediastinum, bulky disease in liver, Pulm Mets, Path fracture 9th rib  Treatment plan: Taxol weekly(started 09/10/2014 )Herceptin Perjeta every 3 weeks(started 09/03/2014)   Current treatment: Received 10 Weeks Taxol, Herceptin Perjeta being given once every 3 weeks (8/17 and 9/7 and 9/28) ; changed chemo from Taxol to Abraxane for bronchospasm along with newly diagnosed diabetes and prefer not to take steroids   Response to chemotherapy: CT chest abdomen pelvis 10/17/2014: Interval decrease in size of right axillary lymphadenopathy, many lymph nodes have resolved, reduction and skin thickening of the right breast, dec in lung nodules  Chemotoxicities:  1. Severe drowsiness related to Benadryl during infusion: Discontinued IV Benadryl, now takes 25 mg by mouth Benadryl  2. Diarrhea 1  3. Mouth sores: Prescribed acyclovir: Resolved  4. 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 I will switch her to Abraxane. 5. Shortness of breath to exertion accompanied by wheezing related to Taxol: We will request insurance authorization to switch her to Abraxane.  Plan: Our plan is to obtain a PET CT scan after finishing 12 cycles of chemotherapy with the weekly taxanes.

## 2014-11-26 ENCOUNTER — Ambulatory Visit (HOSPITAL_BASED_OUTPATIENT_CLINIC_OR_DEPARTMENT_OTHER): Payer: 59

## 2014-11-26 ENCOUNTER — Telehealth: Payer: Self-pay | Admitting: Hematology and Oncology

## 2014-11-26 ENCOUNTER — Other Ambulatory Visit: Payer: Self-pay

## 2014-11-26 ENCOUNTER — Other Ambulatory Visit (HOSPITAL_BASED_OUTPATIENT_CLINIC_OR_DEPARTMENT_OTHER): Payer: 59

## 2014-11-26 ENCOUNTER — Other Ambulatory Visit: Payer: 59

## 2014-11-26 ENCOUNTER — Encounter: Payer: Self-pay | Admitting: *Deleted

## 2014-11-26 ENCOUNTER — Ambulatory Visit (HOSPITAL_BASED_OUTPATIENT_CLINIC_OR_DEPARTMENT_OTHER): Payer: 59 | Admitting: Hematology and Oncology

## 2014-11-26 ENCOUNTER — Encounter: Payer: Self-pay | Admitting: Hematology and Oncology

## 2014-11-26 ENCOUNTER — Ambulatory Visit: Payer: 59 | Admitting: Hematology and Oncology

## 2014-11-26 VITALS — BP 152/88 | HR 87 | Temp 97.6°F | Resp 18

## 2014-11-26 DIAGNOSIS — C787 Secondary malignant neoplasm of liver and intrahepatic bile duct: Secondary | ICD-10-CM

## 2014-11-26 DIAGNOSIS — C50411 Malignant neoplasm of upper-outer quadrant of right female breast: Secondary | ICD-10-CM | POA: Diagnosis not present

## 2014-11-26 DIAGNOSIS — C7951 Secondary malignant neoplasm of bone: Secondary | ICD-10-CM

## 2014-11-26 DIAGNOSIS — C778 Secondary and unspecified malignant neoplasm of lymph nodes of multiple regions: Secondary | ICD-10-CM

## 2014-11-26 DIAGNOSIS — C7800 Secondary malignant neoplasm of unspecified lung: Secondary | ICD-10-CM | POA: Diagnosis not present

## 2014-11-26 DIAGNOSIS — Z5111 Encounter for antineoplastic chemotherapy: Secondary | ICD-10-CM | POA: Diagnosis not present

## 2014-11-26 DIAGNOSIS — Z5112 Encounter for antineoplastic immunotherapy: Secondary | ICD-10-CM | POA: Diagnosis not present

## 2014-11-26 DIAGNOSIS — Z171 Estrogen receptor negative status [ER-]: Secondary | ICD-10-CM

## 2014-11-26 LAB — COMPREHENSIVE METABOLIC PANEL (CC13)
ALBUMIN: 2.7 g/dL — AB (ref 3.5–5.0)
ALK PHOS: 93 U/L (ref 40–150)
ALT: 21 U/L (ref 0–55)
AST: 18 U/L (ref 5–34)
Anion Gap: 10 mEq/L (ref 3–11)
BILIRUBIN TOTAL: 0.36 mg/dL (ref 0.20–1.20)
BUN: 13.6 mg/dL (ref 7.0–26.0)
CO2: 21 mEq/L — ABNORMAL LOW (ref 22–29)
Calcium: 9.6 mg/dL (ref 8.4–10.4)
Chloride: 108 mEq/L (ref 98–109)
Creatinine: 0.8 mg/dL (ref 0.6–1.1)
EGFR: 81 mL/min/{1.73_m2} — AB (ref >90)
GLUCOSE: 178 mg/dL — AB (ref 70–140)
Potassium: 4.5 mEq/L (ref 3.5–5.1)
SODIUM: 139 meq/L (ref 136–145)
TOTAL PROTEIN: 6.4 g/dL (ref 6.4–8.3)

## 2014-11-26 LAB — CBC WITH DIFFERENTIAL/PLATELET
BASO%: 1.5 % (ref 0.0–2.0)
Basophils Absolute: 0.1 10*3/uL (ref 0.0–0.1)
EOS ABS: 0.1 10*3/uL (ref 0.0–0.5)
EOS%: 1.7 % (ref 0.0–7.0)
HEMATOCRIT: 37.6 % (ref 34.8–46.6)
HEMOGLOBIN: 11.9 g/dL (ref 11.6–15.9)
LYMPH%: 28 % (ref 14.0–49.7)
MCH: 27.4 pg (ref 25.1–34.0)
MCHC: 31.8 g/dL (ref 31.5–36.0)
MCV: 86.1 fL (ref 79.5–101.0)
MONO#: 0.4 10*3/uL (ref 0.1–0.9)
MONO%: 6.4 % (ref 0.0–14.0)
NEUT%: 62.4 % (ref 38.4–76.8)
NEUTROS ABS: 4.3 10*3/uL (ref 1.5–6.5)
Platelets: 376 10*3/uL (ref 145–400)
RBC: 4.36 10*6/uL (ref 3.70–5.45)
RDW: 21.1 % — AB (ref 11.2–14.5)
WBC: 6.8 10*3/uL (ref 3.9–10.3)
lymph#: 1.9 10*3/uL (ref 0.9–3.3)

## 2014-11-26 LAB — TECHNOLOGIST REVIEW

## 2014-11-26 MED ORDER — SODIUM CHLORIDE 0.9 % IV SOLN
Freq: Once | INTRAVENOUS | Status: AC
  Administered 2014-11-26: 09:00:00 via INTRAVENOUS

## 2014-11-26 MED ORDER — PACLITAXEL PROTEIN-BOUND CHEMO INJECTION 100 MG
80.0000 mg/m2 | Freq: Once | INTRAVENOUS | Status: AC
  Administered 2014-11-26: 175 mg via INTRAVENOUS
  Filled 2014-11-26: qty 35

## 2014-11-26 MED ORDER — TRASTUZUMAB CHEMO INJECTION 440 MG
6.0000 mg/kg | Freq: Once | INTRAVENOUS | Status: AC
  Administered 2014-11-26: 630 mg via INTRAVENOUS
  Filled 2014-11-26: qty 30

## 2014-11-26 MED ORDER — SODIUM CHLORIDE 0.9 % IV SOLN
420.0000 mg | Freq: Once | INTRAVENOUS | Status: AC
  Administered 2014-11-26: 420 mg via INTRAVENOUS
  Filled 2014-11-26: qty 14

## 2014-11-26 MED ORDER — PALONOSETRON HCL INJECTION 0.25 MG/5ML
INTRAVENOUS | Status: AC
Start: 2014-11-26 — End: 2014-11-26
  Filled 2014-11-26: qty 5

## 2014-11-26 MED ORDER — DIPHENHYDRAMINE HCL 25 MG PO CAPS
ORAL_CAPSULE | ORAL | Status: AC
  Filled 2014-11-26: qty 1

## 2014-11-26 MED ORDER — ACETAMINOPHEN 325 MG PO TABS
650.0000 mg | ORAL_TABLET | Freq: Once | ORAL | Status: AC
  Administered 2014-11-26: 650 mg via ORAL

## 2014-11-26 MED ORDER — DIPHENHYDRAMINE HCL 25 MG PO CAPS
25.0000 mg | ORAL_CAPSULE | Freq: Once | ORAL | Status: AC
  Administered 2014-11-26: 25 mg via ORAL

## 2014-11-26 MED ORDER — HEPARIN SOD (PORK) LOCK FLUSH 100 UNIT/ML IV SOLN
500.0000 [IU] | Freq: Once | INTRAVENOUS | Status: AC | PRN
  Administered 2014-11-26: 500 [IU]
  Filled 2014-11-26: qty 5

## 2014-11-26 MED ORDER — ACETAMINOPHEN 325 MG PO TABS
ORAL_TABLET | ORAL | Status: AC
  Filled 2014-11-26: qty 2

## 2014-11-26 MED ORDER — PALONOSETRON HCL INJECTION 0.25 MG/5ML
0.2500 mg | Freq: Once | INTRAVENOUS | Status: AC
  Administered 2014-11-26: 0.25 mg via INTRAVENOUS

## 2014-11-26 MED ORDER — SODIUM CHLORIDE 0.9 % IJ SOLN
10.0000 mL | INTRAMUSCULAR | Status: DC | PRN
  Administered 2014-11-26: 10 mL
  Filled 2014-11-26: qty 10

## 2014-11-26 NOTE — Progress Notes (Signed)
Prior authorization for echo obtained and provided to [facility] dept. [place] no [id number]

## 2014-11-26 NOTE — Telephone Encounter (Signed)
Appointments made and avs printed for patient,per patient and terri she is getting weekly abraxane,she is a patient and is on recall for echo and bensimhon and they will call her

## 2014-11-26 NOTE — Progress Notes (Signed)
Okay to treat with Echo from 08/21/14 per Dr. Lindi Adie.

## 2014-11-26 NOTE — Addendum Note (Signed)
Addended by: Prentiss Bells on: 11/26/2014 10:52 AM   Modules accepted: Medications

## 2014-11-26 NOTE — Patient Instructions (Addendum)
[facility] Discharge Instructions for Patients Receiving Chemotherapy  Today you received the following chemotherapy agents:  Abraxane, Herceptin, & Perjeta  To help prevent nausea and vomiting after your treatment, we encourage you to take your nausea medication as directed.   If you develop nausea and vomiting that is not controlled by your nausea medication, call the clinic.   BELOW ARE SYMPTOMS THAT SHOULD BE REPORTED IMMEDIATELY:  *FEVER GREATER THAN 100.5 F  *CHILLS WITH OR WITHOUT FEVER  NAUSEA AND VOMITING THAT IS NOT CONTROLLED WITH YOUR NAUSEA MEDICATION  *UNUSUAL SHORTNESS OF BREATH  *UNUSUAL BRUISING OR BLEEDING  TENDERNESS IN MOUTH AND THROAT WITH OR WITHOUT PRESENCE OF ULCERS  *URINARY PROBLEMS  *BOWEL PROBLEMS  UNUSUAL RASH Items with * indicate a potential emergency and should be followed up as soon as possible.  Feel free to call the clinic you have any questions or concerns. The clinic phone number is [phone].  Please show the CHEMO ALERT CARD at check-in to the Emergency Department and triage nurse.   

## 2014-11-26 NOTE — Progress Notes (Signed)
Patient Care Team: Seward Carol, MD as PCP - General (Internal Medicine)  DIAGNOSIS: No matching staging information was found for the patient.  SUMMARY OF ONCOLOGIC HISTORY:   Breast cancer of upper-outer quadrant of right female breast ([place])   08/15/2014 Initial Diagnosis Right Breast Inflammatory invasive ductal cancer: 17 cm by ultrasound along with 13 cm ulceration, node also positive T4N1 (stage 3C) ER 0%, PR 0%, Her-2 Pending   08/22/2014 PET scan Metastatic disease. In addition to Right breast, hypermetabolic disease in Rt Supraclav LN, axilla and mediastinum, bulky disease in liver, Pulm Mets, Path fracture 9th rib   09/03/2014 -  Chemotherapy Herceptin Perjeta started 09/03/2014, Taxol weekly added 09/10/2014   10/17/2014 Imaging CT scans: Decrease in right axillary lymph node, decreased skin thickening of the right breast, decreased in bilateral pulmonary nodules, decrease in liver metastases    CHIEF COMPLIANT: Abraxane Herceptin and Perjeta today's week 10  INTERVAL HISTORY: Denise Morton is a 64 year old with above-mentioned history of right breast cancer with metastatic disease currently on palliative chemotherapy. She had bronchospasm to Taxol and we changed her treatment with Abraxane. Today is week 10 of chemotherapy. The first week she only received Herceptin Perjeta. She reports that the bronchospasm has not happened with Abraxane. 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 She had no problems with nausea or diarrhea.  REVIEW OF SYSTEMS:   Constitutional: Denies fevers, chills or abnormal weight loss Eyes: Denies blurriness of vision Ears, nose, mouth, throat, and face: Denies mucositis or sore throat Respiratory: Denies cough, dyspnea or wheezes Cardiovascular: Denies palpitation, chest discomfort or lower extremity swelling Gastrointestinal:  Denies nausea, heartburn or change in bowel habits Skin: Denies abnormal skin rashes Lymphatics: Denies new lymphadenopathy  or easy bruising Neurological: Mild tingling of the tips of the fingers Behavioral/Psych: Mood is stable, no new changes  All other systems were reviewed with the patient and are negative.  I have reviewed the past medical history, past surgical history, social history and family history with the patient and they are unchanged from previous note.  ALLERGIES:  is allergic to penicillin g and sulfa antibiotics.  MEDICATIONS:  Current Outpatient Prescriptions  Medication Sig Dispense Refill  . ALPRAZolam (XANAX) 0.25 MG tablet Take 0.25 mg by mouth daily as needed for anxiety.    . carvedilol (COREG) 6.25 MG tablet Take 1 tablet (6.25 mg total) by mouth 2 (two) times daily with a meal. 60 tablet 3  . lidocaine-prilocaine (EMLA) cream Apply to port 1-2 hours before procedure 30 g 1  . loperamide (IMODIUM) 2 MG capsule Take 2 mg by mouth as needed for diarrhea or loose stools.    . Multiple Vitamin (MULTIVITAMIN WITH MINERALS) TABS tablet Take 1 tablet by mouth daily.    . nicotine polacrilex (COMMIT) 2 MG lozenge Take 2 mg by mouth as needed for smoking cessation.    Marland Kitchen spironolactone (ALDACTONE) 25 MG tablet Take 0.5 tablets (12.5 mg total) by mouth daily. 45 tablet 3   No current facility-administered medications for this visit.   Facility-Administered Medications Ordered in Other Visits  Medication Dose Route Frequency Provider Last Rate Last Dose  . heparin lock flush 100 unit/mL  500 Units Intracatheter Once PRN Nicholas Lose, MD      . PACLitaxel-protein bound (ABRAXANE) chemo infusion 175 mg  80 mg/m2 (Treatment Plan Actual) Intravenous Once Nicholas Lose, MD      . palonosetron (ALOXI) injection 0.25 mg  0.25 mg Intravenous Once Nicholas Lose, MD      .  pertuzumab (PERJETA) 420 mg in sodium chloride 0.9 % 250 mL chemo infusion  420 mg Intravenous Once Nicholas Lose, MD      . sodium chloride 0.9 % injection 10 mL  10 mL Intracatheter PRN Nicholas Lose, MD      . trastuzumab (HERCEPTIN) 630  mg in sodium chloride 0.9 % 250 mL chemo infusion  6 mg/kg (Treatment Plan Actual) Intravenous Once Nicholas Lose, MD        PHYSICAL EXAMINATION: ECOG PERFORMANCE STATUS: 1 - Symptomatic but completely ambulatory  There were no vitals filed for this visit. There were no vitals filed for this visit.  GENERAL:alert, no distress and comfortable SKIN: skin color, texture, turgor are normal, no rashes or significant lesions EYES: normal, Conjunctiva are pink and non-injected, sclera clear OROPHARYNX:no exudate, no erythema and lips, buccal mucosa, and tongue normal  NECK: supple, thyroid normal size, non-tender, without nodularity LYMPH:  no palpable lymphadenopathy in the cervical, axillary or inguinal LUNGS: clear to auscultation and percussion with normal breathing effort HEART: regular rate & rhythm and no murmurs and no lower extremity edema ABDOMEN:abdomen soft, non-tender and normal bowel sounds Musculoskeletal:no cyanosis of digits and no clubbing  NEURO: alert & oriented x 3 with fluent speech, no focal motor/sensory deficits   LABORATORY DATA:  I have reviewed the data as listed   Chemistry      Component Value Date/Time   NA 139 11/26/2014 0812   NA 140 12/03/2006 1854   K 4.5 11/26/2014 0812   K 3.8 12/03/2006 1854   CL 108 12/03/2006 1854   CO2 21* 11/26/2014 0812   BUN 13.6 11/26/2014 0812   BUN 21 12/03/2006 1854   CREATININE 0.8 11/26/2014 0812   CREATININE 1.0 12/03/2006 1854      Component Value Date/Time   CALCIUM 9.6 11/26/2014 0812   ALKPHOS 93 11/26/2014 0812   AST 18 11/26/2014 0812   ALT 21 11/26/2014 0812   BILITOT 0.36 11/26/2014 0812       Lab Results  Component Value Date   WBC 6.8 11/26/2014   HGB 11.9 11/26/2014   HCT 37.6 11/26/2014   MCV 86.1 11/26/2014   PLT 376 11/26/2014   NEUTROABS 4.3 11/26/2014   ASSESSMENT & PLAN:  Breast cancer of upper-outer quadrant of right female breast Metastatic Breast cancer: Right Breast  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 In addition to Right breast, hypermetabolic disease in Rt Supraclav LN, axilla and mediastinum, bulky disease in liver, Pulm Mets, Path fracture 9th rib  Treatment plan: Taxol weekly(started 09/10/2014 )Herceptin Perjeta every 3 weeks(started 09/03/2014)   Current treatment: Received 10 Weeks Taxol, Herceptin Perjeta being given once every 3 weeks (8/17 and 9/7 and 9/28) ; changed chemo from Taxol to Abraxane for bronchospasm along with newly diagnosed diabetes and prefer not to take steroids   Response to chemotherapy: CT chest abdomen pelvis 10/17/2014: Interval decrease in size of right axillary lymphadenopathy, many lymph nodes have resolved, reduction and skin thickening of the right breast, dec in lung nodules  Chemotoxicities:  1. Severe drowsiness related to Benadryl during infusion: Discontinued IV Benadryl, now takes 25 mg by mouth Benadryl  2. Diarrhea 1  3. Mouth sores: Prescribed acyclovir: Resolved  4. 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 Switched to Abraxane with improvement in her symptoms. 5. Shortness of breath to exertion accompanied by wheezing related to  Taxol: switched her to Abraxane with week 10.  Plan: Our plan is to obtain a PET CT scan after finishing 12 cycles of chemotherapy with the weekly taxanes. We will also need an echocardiogram on her. Return to clinic in 2 weeks to discuss PET CT scan and follow-up plan. One potential treatment option could be to keep her on maintenance Herceptin and Perjeta.  Orders Placed This Encounter  Procedures  . NM PET Image Restag (PS) Skull Base To Thigh    Standing Status: Future     Number of Occurrences:      Standing Expiration Date: 11/26/2015    Order Specific Question:  Reason for Exam (SYMPTOM  OR DIAGNOSIS REQUIRED)    Answer:   Resatging after completion of chemo    Order Specific Question:  Preferred imaging location?    Answer:  [facility]    Order Specific Question:  If indicated for the ordered procedure, I authorize the administration of a radiopharmaceutical per Radiology protocol    Answer:  Yes  . ECHOCARDIOGRAM COMPLETE    Standing Status: Future     Number of Occurrences:      Standing Expiration Date: 02/25/2016    Order Specific Question:  Where should this test be performed    Answer:  [facility]    Order Specific Question:  Complete or Limited study?    Answer:  Complete    Order Specific Question:  With Image Enhancing Agent or without Image Enhancing Agent?    Answer:  With Image Enhancing Agent    Order Specific Question:  Reason for exam-Echo    Answer:  Chemo  V67.2 / Z09   The patient has a good understanding of the overall plan. she agrees with it. she will call with any problems that may develop before the next visit here.   Rulon Eisenmenger, MD 11/26/2014

## 2014-11-27 ENCOUNTER — Ambulatory Visit (HOSPITAL_COMMUNITY): Payer: 59

## 2014-12-01 ENCOUNTER — Ambulatory Visit (HOSPITAL_COMMUNITY)
Admission: RE | Admit: 2014-12-01 | Discharge: 2014-12-01 | Disposition: A | Discharge status: Home / Self Care | Payer: 59 | Source: Ambulatory Visit | Attending: Hematology and Oncology | Admitting: Hematology and Oncology

## 2014-12-01 DIAGNOSIS — C50411 Malignant neoplasm of upper-outer quadrant of right female breast: Secondary | ICD-10-CM | POA: Diagnosis not present

## 2014-12-01 DIAGNOSIS — I313 Pericardial effusion (noninflammatory): Secondary | ICD-10-CM | POA: Diagnosis not present

## 2014-12-01 DIAGNOSIS — Z0189 Encounter for other specified special examinations: Secondary | ICD-10-CM | POA: Diagnosis present

## 2014-12-01 NOTE — Progress Notes (Signed)
*  PRELIMINARY RESULTS* Echocardiogram 2D Echocardiogram has been performed.  Denise Morton 12/01/2014, 10:06 AM

## 2014-12-03 ENCOUNTER — Other Ambulatory Visit (HOSPITAL_BASED_OUTPATIENT_CLINIC_OR_DEPARTMENT_OTHER): Payer: 59

## 2014-12-03 ENCOUNTER — Ambulatory Visit (HOSPITAL_BASED_OUTPATIENT_CLINIC_OR_DEPARTMENT_OTHER): Payer: 59

## 2014-12-03 VITALS — BP 151/78 | HR 79 | Temp 97.8°F | Resp 20

## 2014-12-03 DIAGNOSIS — Z5111 Encounter for antineoplastic chemotherapy: Secondary | ICD-10-CM | POA: Diagnosis not present

## 2014-12-03 DIAGNOSIS — C50411 Malignant neoplasm of upper-outer quadrant of right female breast: Secondary | ICD-10-CM

## 2014-12-03 LAB — COMPREHENSIVE METABOLIC PANEL (CC13)
ALBUMIN: 2.9 g/dL — AB (ref 3.5–5.0)
ALK PHOS: 90 U/L (ref 40–150)
ALT: 19 U/L (ref 0–55)
AST: 16 U/L (ref 5–34)
Anion Gap: 9 mEq/L (ref 3–11)
BUN: 15.2 mg/dL (ref 7.0–26.0)
CALCIUM: 9.6 mg/dL (ref 8.4–10.4)
CO2: 22 mEq/L (ref 22–29)
CREATININE: 0.8 mg/dL (ref 0.6–1.1)
Chloride: 108 mEq/L (ref 98–109)
EGFR: 80 mL/min/{1.73_m2} — ABNORMAL LOW (ref >90)
GLUCOSE: 161 mg/dL — AB (ref 70–140)
POTASSIUM: 4.1 meq/L (ref 3.5–5.1)
SODIUM: 138 meq/L (ref 136–145)
Total Bilirubin: 0.44 mg/dL (ref 0.20–1.20)
Total Protein: 6.3 g/dL — ABNORMAL LOW (ref 6.4–8.3)

## 2014-12-03 LAB — CBC WITH DIFFERENTIAL/PLATELET
BASO%: 1.3 % (ref 0.0–2.0)
Basophils Absolute: 0.1 10*3/uL (ref 0.0–0.1)
EOS%: 2.2 % (ref 0.0–7.0)
Eosinophils Absolute: 0.1 10*3/uL (ref 0.0–0.5)
HEMATOCRIT: 38.7 % (ref 34.8–46.6)
HEMOGLOBIN: 12.2 g/dL (ref 11.6–15.9)
LYMPH#: 1.7 10*3/uL (ref 0.9–3.3)
LYMPH%: 33.2 % (ref 14.0–49.7)
MCH: 27.3 pg (ref 25.1–34.0)
MCHC: 31.6 g/dL (ref 31.5–36.0)
MCV: 86.5 fL (ref 79.5–101.0)
MONO#: 0.3 10*3/uL (ref 0.1–0.9)
MONO%: 6.6 % (ref 0.0–14.0)
NEUT%: 56.7 % (ref 38.4–76.8)
NEUTROS ABS: 2.8 10*3/uL (ref 1.5–6.5)
Platelets: 339 10*3/uL (ref 145–400)
RBC: 4.47 10*6/uL (ref 3.70–5.45)
RDW: 22.4 % — AB (ref 11.2–14.5)
WBC: 5 10*3/uL (ref 3.9–10.3)

## 2014-12-03 MED ORDER — HEPARIN SOD (PORK) LOCK FLUSH 100 UNIT/ML IV SOLN
500.0000 [IU] | Freq: Once | INTRAVENOUS | Status: AC | PRN
  Administered 2014-12-03: 500 [IU]
  Filled 2014-12-03: qty 5

## 2014-12-03 MED ORDER — SODIUM CHLORIDE 0.9 % IV SOLN
Freq: Once | INTRAVENOUS | Status: AC
  Administered 2014-12-03: 09:00:00 via INTRAVENOUS

## 2014-12-03 MED ORDER — SODIUM CHLORIDE 0.9 % IJ SOLN
10.0000 mL | INTRAMUSCULAR | Status: DC | PRN
  Administered 2014-12-03: 10 mL
  Filled 2014-12-03: qty 10

## 2014-12-03 MED ORDER — PACLITAXEL PROTEIN-BOUND CHEMO INJECTION 100 MG
80.0000 mg/m2 | Freq: Once | INTRAVENOUS | Status: AC
  Administered 2014-12-03: 175 mg via INTRAVENOUS
  Filled 2014-12-03: qty 35

## 2014-12-03 MED ORDER — PALONOSETRON HCL INJECTION 0.25 MG/5ML
INTRAVENOUS | Status: AC
  Filled 2014-12-03: qty 5

## 2014-12-03 MED ORDER — PALONOSETRON HCL INJECTION 0.25 MG/5ML
0.2500 mg | Freq: Once | INTRAVENOUS | Status: AC
  Administered 2014-12-03: 0.25 mg via INTRAVENOUS

## 2014-12-03 NOTE — Patient Instructions (Signed)
[facility] Discharge Instructions for Patients Receiving Chemotherapy  Today you received the following chemotherapy agents: Abraxane   To help prevent nausea and vomiting after your treatment, we encourage you to take your nausea medication as directed.    If you develop nausea and vomiting that is not controlled by your nausea medication, call the clinic.   BELOW ARE SYMPTOMS THAT SHOULD BE REPORTED IMMEDIATELY:  *FEVER GREATER THAN 100.5 F  *CHILLS WITH OR WITHOUT FEVER  NAUSEA AND VOMITING THAT IS NOT CONTROLLED WITH YOUR NAUSEA MEDICATION  *UNUSUAL SHORTNESS OF BREATH  *UNUSUAL BRUISING OR BLEEDING  TENDERNESS IN MOUTH AND THROAT WITH OR WITHOUT PRESENCE OF ULCERS  *URINARY PROBLEMS  *BOWEL PROBLEMS  UNUSUAL RASH Items with * indicate a potential emergency and should be followed up as soon as possible.  Feel free to call the clinic you have any questions or concerns. The clinic phone number is [phone].  Please show the CHEMO ALERT CARD at check-in to the Emergency Department and triage nurse.   

## 2014-12-09 ENCOUNTER — Other Ambulatory Visit: Payer: Self-pay | Admitting: *Deleted

## 2014-12-09 ENCOUNTER — Telehealth: Payer: Self-pay | Admitting: Hematology and Oncology

## 2014-12-09 ENCOUNTER — Encounter (HOSPITAL_COMMUNITY)
Admission: RE | Admit: 2014-12-09 | Discharge: 2014-12-09 | Disposition: A | Discharge status: Home / Self Care | Payer: 59 | Source: Ambulatory Visit | Attending: Hematology and Oncology | Admitting: Hematology and Oncology

## 2014-12-09 DIAGNOSIS — C50411 Malignant neoplasm of upper-outer quadrant of right female breast: Secondary | ICD-10-CM | POA: Insufficient documentation

## 2014-12-09 LAB — GLUCOSE, CAPILLARY: GLUCOSE-CAPILLARY: 140 mg/dL — AB (ref 65–99)

## 2014-12-09 MED ORDER — FLUDEOXYGLUCOSE F - 18 (FDG) INJECTION
10.6000 | Freq: Once | INTRAVENOUS | Status: DC | PRN
  Administered 2014-12-09: 10.6 via INTRAVENOUS
  Filled 2014-12-09: qty 10.6

## 2014-12-09 NOTE — Telephone Encounter (Signed)
Called patient and she is aware of her added visit

## 2014-12-10 ENCOUNTER — Ambulatory Visit (HOSPITAL_BASED_OUTPATIENT_CLINIC_OR_DEPARTMENT_OTHER): Payer: 59 | Admitting: Hematology and Oncology

## 2014-12-10 ENCOUNTER — Other Ambulatory Visit (HOSPITAL_BASED_OUTPATIENT_CLINIC_OR_DEPARTMENT_OTHER): Payer: 59

## 2014-12-10 ENCOUNTER — Ambulatory Visit (HOSPITAL_COMMUNITY)
Admission: RE | Admit: 2014-12-10 | Discharge: 2014-12-10 | Disposition: A | Discharge status: Home / Self Care | Payer: 59 | Source: Ambulatory Visit | Attending: Cardiovascular Disease | Admitting: Cardiovascular Disease

## 2014-12-10 ENCOUNTER — Other Ambulatory Visit: Payer: 59

## 2014-12-10 ENCOUNTER — Encounter: Payer: Self-pay | Admitting: *Deleted

## 2014-12-10 ENCOUNTER — Ambulatory Visit: Payer: 59 | Admitting: Hematology and Oncology

## 2014-12-10 ENCOUNTER — Other Ambulatory Visit: Payer: Self-pay | Admitting: *Deleted

## 2014-12-10 ENCOUNTER — Ambulatory Visit (HOSPITAL_COMMUNITY): Payer: 59

## 2014-12-10 ENCOUNTER — Telehealth: Payer: Self-pay | Admitting: Hematology and Oncology

## 2014-12-10 ENCOUNTER — Ambulatory Visit: Payer: 59

## 2014-12-10 ENCOUNTER — Encounter: Payer: Self-pay | Admitting: Hematology and Oncology

## 2014-12-10 VITALS — BP 157/88 | HR 90 | Temp 98.2°F | Resp 18 | Ht 65.5 in | Wt 235.0 lb

## 2014-12-10 DIAGNOSIS — C7800 Secondary malignant neoplasm of unspecified lung: Secondary | ICD-10-CM | POA: Diagnosis not present

## 2014-12-10 DIAGNOSIS — Z171 Estrogen receptor negative status [ER-]: Secondary | ICD-10-CM

## 2014-12-10 DIAGNOSIS — M7989 Other specified soft tissue disorders: Secondary | ICD-10-CM | POA: Insufficient documentation

## 2014-12-10 DIAGNOSIS — C50411 Malignant neoplasm of upper-outer quadrant of right female breast: Secondary | ICD-10-CM

## 2014-12-10 DIAGNOSIS — C7951 Secondary malignant neoplasm of bone: Secondary | ICD-10-CM | POA: Diagnosis not present

## 2014-12-10 LAB — CBC WITH DIFFERENTIAL/PLATELET
BASO%: 1 % (ref 0.0–2.0)
Basophils Absolute: 0.1 10*3/uL (ref 0.0–0.1)
EOS%: 1.2 % (ref 0.0–7.0)
Eosinophils Absolute: 0.1 10*3/uL (ref 0.0–0.5)
HEMATOCRIT: 38.5 % (ref 34.8–46.6)
HGB: 12.1 g/dL (ref 11.6–15.9)
LYMPH%: 33.5 % (ref 14.0–49.7)
MCH: 27.9 pg (ref 25.1–34.0)
MCHC: 31.5 g/dL (ref 31.5–36.0)
MCV: 88.7 fL (ref 79.5–101.0)
MONO#: 0.3 10*3/uL (ref 0.1–0.9)
MONO%: 6.9 % (ref 0.0–14.0)
NEUT#: 2.8 10*3/uL (ref 1.5–6.5)
NEUT%: 57.4 % (ref 38.4–76.8)
Platelets: 312 10*3/uL (ref 145–400)
RBC: 4.34 10*6/uL (ref 3.70–5.45)
RDW: 23.3 % — AB (ref 11.2–14.5)
WBC: 4.8 10*3/uL (ref 3.9–10.3)
lymph#: 1.6 10*3/uL (ref 0.9–3.3)

## 2014-12-10 LAB — COMPREHENSIVE METABOLIC PANEL (CC13)
ALBUMIN: 2.9 g/dL — AB (ref 3.5–5.0)
ALK PHOS: 87 U/L (ref 40–150)
ALT: 16 U/L (ref 0–55)
AST: 16 U/L (ref 5–34)
Anion Gap: 8 mEq/L (ref 3–11)
BUN: 15.5 mg/dL (ref 7.0–26.0)
CALCIUM: 9.5 mg/dL (ref 8.4–10.4)
CHLORIDE: 111 meq/L — AB (ref 98–109)
CO2: 22 mEq/L (ref 22–29)
Creatinine: 0.8 mg/dL (ref 0.6–1.1)
EGFR: 81 mL/min/{1.73_m2} — AB (ref >90)
Glucose: 138 mg/dl (ref 70–140)
POTASSIUM: 4.2 meq/L (ref 3.5–5.1)
Sodium: 141 mEq/L (ref 136–145)
Total Bilirubin: 0.41 mg/dL (ref 0.20–1.20)
Total Protein: 6 g/dL — ABNORMAL LOW (ref 6.4–8.3)

## 2014-12-10 NOTE — Assessment & Plan Note (Signed)
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 In addition to Right breast, hypermetabolic disease in Rt Supraclav LN, axilla and mediastinum, bulky disease in liver, Pulm Mets, Path fracture 9th rib   Treatment summary:Taxol weekly(started 09/10/2014 )Herceptin Perjeta every 3 weeks(started 09/03/2014) Received 10 Weeks Taxol, Herceptin Perjeta being given once every 3 weeks (8/17 and 9/7 and 9/28, 10/19) ; changed chemo from Taxol to Abraxane for bronchospasm 11/19/14 along with newly diagnosed diabetes and prefer not to take steroids   PET CT scan 12/09/2014: Complete response in the liver, marked improvement in metastatic disease in the right breast and right axilla, improvement in bone metastases, decrease in lymphadenopathy throughout. Resolution of lung nodules.  I discussed the radiology report in great detail and showed the pictures of her scans to her 2 sons who accompanied her today. We're all very happy to see the excellent result of chemotherapy.  Plan: 1. Initiate maintenance Herceptin and Perjeta every 3 weeks 2. Discontinue Abraxane chemotherapy  3. 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 Return to clinic in 2 weeks to start Herceptin Perjeta maintenance.

## 2014-12-10 NOTE — Telephone Encounter (Signed)
Patient stopped by and had 11/30 appointments in place,new chemo added to schedule

## 2014-12-10 NOTE — Telephone Encounter (Signed)
Per patient she got her scan results today and request to cancel 11/28 and  Has appointments 11/30

## 2014-12-10 NOTE — Progress Notes (Signed)
Patient Care Team: Seward Carol, MD as PCP - General (Internal Medicine)  DIAGNOSIS: No matching staging information was found for the patient.  SUMMARY OF ONCOLOGIC HISTORY:   Breast cancer of upper-outer quadrant of right female breast ([place])   08/15/2014 Initial Diagnosis Right Breast Inflammatory invasive ductal cancer: 17 cm by ultrasound along with 13 cm ulceration, node also positive T4N1 (stage 3C) ER 0%, PR 0%, Her-2 Pending   08/22/2014 PET scan Metastatic disease. In addition to Right breast, hypermetabolic disease in Rt Supraclav LN, axilla and mediastinum, bulky disease in liver, Pulm Mets, Path fracture 9th rib   09/03/2014 - 11/26/2014 Chemotherapy Herceptin Perjeta started 09/03/2014, Taxol weekly added 09/10/2014   10/17/2014 Imaging CT scans: Decrease in right axillary lymph node, decreased skin thickening of the right breast, decreased in bilateral pulmonary nodules, decrease in liver metastases   12/09/2014 PET scan Remarkable response to chemotherapy resolution of liver metastases, marked improvement in the tumor in the breast, marked improvement in right axillary lymph nodes, resolution of lung nodules, improvement in left sixth rib    Chemotherapy Herceptin and Perjeta maintenance every 3 weeks    CHIEF COMPLIANT: Follow-up after PET CT scan  INTERVAL HISTORY: Denise Morton is a 64 year old with above-mentioned history of right breast inflammatory cancer with metastatic disease in liver, lungs, bone and lymph nodes. She received chemotherapy with Taxol Herceptin Perjeta and underwent a PET CT scan is here today to discuss the results. She complains of neuropathy related to chemotherapy. She also bronchospasm related to Taxol and her breathing appears to be slowly improving. She does not have any cough or expectoration.  REVIEW OF SYSTEMS:   Constitutional: Denies fevers, chills or abnormal weight loss Eyes: Denies blurriness of vision Ears, nose, mouth, throat, and face:  Denies mucositis or sore throat Respiratory: Shortness of breath Cardiovascular: Denies palpitation, chest discomfort or lower extremity swelling Gastrointestinal:  Denies nausea, heartburn or change in bowel habits Skin: Denies abnormal skin rashes Lymphatics: Denies new lymphadenopathy or easy bruising Neurological:Denies numbness, tingling or new weaknesses Behavioral/Psych: Mood is stable, no new changes  Breast: Marked improvement inflammatory breast cancer All other systems were reviewed with the patient and are negative.  I have reviewed the past medical history, past surgical history, social history and family history with the patient and they are unchanged from previous note.  ALLERGIES:  is allergic to penicillin g and sulfa antibiotics.  MEDICATIONS:  Current Outpatient Prescriptions  Medication Sig Dispense Refill  . ALPRAZolam (XANAX) 0.25 MG tablet Take 0.25 mg by mouth daily as needed for anxiety.    . carvedilol (COREG) 6.25 MG tablet Take 1 tablet (6.25 mg total) by mouth 2 (two) times daily with a meal. 60 tablet 3  . lidocaine-prilocaine (EMLA) cream Apply to port 1-2 hours before procedure 30 g 1  . loperamide (IMODIUM) 2 MG capsule Take 2 mg by mouth as needed for diarrhea or loose stools.    . Multiple Vitamin (MULTIVITAMIN WITH MINERALS) TABS tablet Take 1 tablet by mouth daily.    . nicotine polacrilex (COMMIT) 2 MG lozenge Take 2 mg by mouth as needed for smoking cessation.    . ondansetron (ZOFRAN) 8 MG tablet TAKE ONE TABLET BY MOUTH TWICE DAILY. START THE DAY AFTER CHEMO FOR TWO DAYS. THEN TAKE AS NEEDED FOR NAUSEA OR FOR VOMITING  1  . prochlorperazine (COMPAZINE) 10 MG tablet TAKE ONE TABLET BY MOUTH EVERY 6 HOURS AS NEEDED FOR NAUSEA OR FOR VOMITING  1  .  spironolactone (ALDACTONE) 25 MG tablet Take 0.5 tablets (12.5 mg total) by mouth daily. 45 tablet 3   No current facility-administered medications for this visit.   Facility-Administered Medications  Ordered in Other Visits  Medication Dose Route Frequency Provider Last Rate Last Dose  . fludeoxyglucose F - 18 (FDG) injection 10.6 milli Curie  10.6 milli Curie Intravenous Once PRN Nicholas Lose, MD   10.6 milli Curie at 12/09/14 0725    PHYSICAL EXAMINATION: ECOG PERFORMANCE STATUS: 2 - Symptomatic, <50% confined to bed  Filed Vitals:   12/10/14 0945  BP: 157/88  Pulse: 90  Temp: 98.2 F (36.8 C)  Resp: 18   Filed Weights   12/10/14 0945  Weight: 235 lb (106.595 kg)    GENERAL:alert, no distress and comfortable SKIN: skin color, texture, turgor are normal, no rashes or significant lesions EYES: normal, Conjunctiva are pink and non-injected, sclera clear OROPHARYNX:no exudate, no erythema and lips, buccal mucosa, and tongue normal  NECK: supple, thyroid normal size, non-tender, without nodularity LYMPH:  no palpable lymphadenopathy in the cervical, axillary or inguinal LUNGS: clear to auscultation and percussion with normal breathing effort HEART: regular rate & rhythm and no murmurs and no lower extremity edema ABDOMEN:abdomen soft, non-tender and normal bowel sounds Musculoskeletal:no cyanosis of digits and no clubbing  NEURO: alert & oriented x 3 with fluent speech, no focal motor/sensory deficits  LABORATORY DATA:  I have reviewed the data as listed   Chemistry      Component Value Date/Time   NA 141 12/10/2014 0854   NA 140 12/03/2006 1854   K 4.2 12/10/2014 0854   K 3.8 12/03/2006 1854   CL 108 12/03/2006 1854   CO2 22 12/10/2014 0854   BUN 15.5 12/10/2014 0854   BUN 21 12/03/2006 1854   CREATININE 0.8 12/10/2014 0854   CREATININE 1.0 12/03/2006 1854      Component Value Date/Time   CALCIUM 9.5 12/10/2014 0854   ALKPHOS 87 12/10/2014 0854   AST 16 12/10/2014 0854   ALT 16 12/10/2014 0854   BILITOT 0.41 12/10/2014 0854       Lab Results  Component Value Date   WBC 4.8 12/10/2014   HGB 12.1 12/10/2014   HCT 38.5 12/10/2014   MCV 88.7 12/10/2014    PLT 312 12/10/2014   NEUTROABS 2.8 12/10/2014     RADIOGRAPHIC STUDIES: I have personally reviewed the radiology reports and agreed with their findings. Nm Pet Image Restag (ps) Skull Base To Thigh  12/09/2014  CLINICAL DATA:  Subsequent treatment strategy for restaging of right-sided breast cancer. EXAM: NUCLEAR MEDICINE PET SKULL BASE TO THIGH TECHNIQUE: 10.6 mCi F-18 FDG was injected intravenously. Full-ring PET imaging was performed from the skull base to thigh after the radiotracer. CT data was obtained and used for attenuation correction and anatomic localization. FASTING BLOOD GLUCOSE:  Value: 140 mg/dl COMPARISON:  Chest abdomen and pelvic CTs of 10/17/2014. PET of 08/22/2014. FINDINGS: NECK No areas of abnormal hypermetabolism. CHEST The inflammatory right breast primary is markedly improved since the prior PET. Residual skin and breast thickening with hypermetabolism measures a S.U.V. max of 5.4 (image 64, series 4). Compare a S.U.V. max of 22.3 on the prior exam (when remeasured). Improvement in right axillary adenopathy. Index right axillary node measures 1.6 cm and a S.U.V. max of 3.2 on image 67. This is decreased from the 10/17/2014 CT where it measured 2.1 cm. Decreased in hypermetabolism since the prior PET, where it measured a S.U.V. max of 7.2. No  well-defined residual hypermetabolic pulmonary nodules. There is mild hypermetabolism corresponding to increased ground-glass opacity in the left upper lobe (image 13, series 6). ABDOMEN/PELVIS Right adrenal hypermetabolism with mild thickening. This is new, measuring a S.U.V. max of 4.0. No well-defined adrenal nodule. Markedly improved appearance of the liver, without well-defined residual hypermetabolism. Index lateral segment left liver lobe hypo attenuating mass measures 5.7 cm today on image 96 versus 6.8 cm on 10/17/2014. SKELETON The left posterior ninth rib osseous metastasis is no longer hypermetabolic. There is vague hypermetabolism  within the right acetabulum, measuring a S.U.V. max of 5.2 (image 181, series 4). No CT correlate. CT IMAGES PERFORMED FOR ATTENUATION CORRECTION Carotid atherosclerosis. No cervical adenopathy. A left-sided Port-A-Cath which terminates at the high right atrium. Mild cardiomegaly. Multivessel coronary artery atherosclerosis. Small hiatal hernia. Bilateral nephrolithiasis. Pelvic floor laxity. Colonic stool burden suggests constipation. IMPRESSION: 1. Response to therapy since the prior PET of 08/22/2014 and the prior diagnostic CTs of 10/17/2014. 2. Equivocal hypermetabolism within the right adrenal gland without well-defined nodule. Right acetabular hypermetabolism could represent a new site of osseous metastasis. 3. Left apical/upper lobe hypermetabolism with ground-glass opacity. Suspect secondary to drug toxicity versus less likely atypical infection. 4. Age advanced coronary artery atherosclerosis. Recommend assessment of coronary risk factors and consideration of medical therapy. 5. Bilateral nephrolithiasis. Electronically Signed   By: Abigail Miyamoto M.D.   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 In addition to Right breast, hypermetabolic disease in Rt Supraclav LN, axilla and mediastinum, bulky disease in liver, Pulm Mets, Path fracture 9th rib   Treatment summary:Taxol weekly(started 09/10/2014 )Herceptin Perjeta every 3 weeks(started 09/03/2014) Received 10 Weeks Taxol, Herceptin Perjeta being given once every 3 weeks (8/17 and 9/7 and 9/28, 10/19) ; changed chemo from Taxol to Abraxane for bronchospasm 11/19/14 along with newly diagnosed diabetes and prefer not to take steroids   PET CT scan 12/09/2014:  Complete response in the liver, marked improvement in metastatic disease in the right breast and right axilla, improvement in bone metastases, decrease in lymphadenopathy throughout. Resolution of lung nodules.  I discussed the radiology report in great detail and showed the pictures of her scans to her 2 sons who accompanied her today. We're all very happy to see the excellent result of chemotherapy.  Plan: 1. Initiate maintenance Herceptin and Perjeta every 3 weeks 2. Discontinue Abraxane chemotherapy  3. 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 Return to clinic in 1 weeks to start Herceptin Perjeta maintenance.     Orders Placed This Encounter  Procedures  . [place] Extrem Up Right Comp    Standing Status: Future     Number of Occurrences:      Standing Expiration Date: 02/09/2016    Order Specific Question:  Reason for Exam (SYMPTOM  OR DIAGNOSIS REQUIRED)    Answer:  Rt arm swelling    Order Specific Question:  Preferred imaging location?    Answer:  [facility]   The patient has a good understanding of the overall plan. she agrees with it. she will call with any problems that may develop before the next visit here.   Nicholas Lose  K, MD 12/10/2014

## 2014-12-15 ENCOUNTER — Ambulatory Visit: Payer: 59 | Admitting: Hematology and Oncology

## 2014-12-17 ENCOUNTER — Ambulatory Visit (HOSPITAL_BASED_OUTPATIENT_CLINIC_OR_DEPARTMENT_OTHER): Payer: 59

## 2014-12-17 ENCOUNTER — Other Ambulatory Visit (HOSPITAL_BASED_OUTPATIENT_CLINIC_OR_DEPARTMENT_OTHER): Payer: 59

## 2014-12-17 VITALS — BP 133/77 | HR 72 | Temp 98.5°F | Resp 18

## 2014-12-17 DIAGNOSIS — C50411 Malignant neoplasm of upper-outer quadrant of right female breast: Secondary | ICD-10-CM

## 2014-12-17 DIAGNOSIS — C7951 Secondary malignant neoplasm of bone: Secondary | ICD-10-CM

## 2014-12-17 DIAGNOSIS — Z5112 Encounter for antineoplastic immunotherapy: Secondary | ICD-10-CM

## 2014-12-17 DIAGNOSIS — C787 Secondary malignant neoplasm of liver and intrahepatic bile duct: Secondary | ICD-10-CM | POA: Diagnosis not present

## 2014-12-17 LAB — COMPREHENSIVE METABOLIC PANEL (CC13)
ALBUMIN: 3 g/dL — AB (ref 3.5–5.0)
ALT: 14 U/L (ref 0–55)
ANION GAP: 9 meq/L (ref 3–11)
AST: 16 U/L (ref 5–34)
Alkaline Phosphatase: 86 U/L (ref 40–150)
BILIRUBIN TOTAL: 0.59 mg/dL (ref 0.20–1.20)
BUN: 14.4 mg/dL (ref 7.0–26.0)
CO2: 19 mEq/L — ABNORMAL LOW (ref 22–29)
Calcium: 9.2 mg/dL (ref 8.4–10.4)
Chloride: 111 mEq/L — ABNORMAL HIGH (ref 98–109)
Creatinine: 0.8 mg/dL (ref 0.6–1.1)
EGFR: 81 mL/min/{1.73_m2} — AB (ref >90)
GLUCOSE: 175 mg/dL — AB (ref 70–140)
Potassium: 4 mEq/L (ref 3.5–5.1)
Sodium: 139 mEq/L (ref 136–145)
TOTAL PROTEIN: 6.5 g/dL (ref 6.4–8.3)

## 2014-12-17 LAB — CBC WITH DIFFERENTIAL/PLATELET
BASO%: 0.3 % (ref 0.0–2.0)
Basophils Absolute: 0 10*3/uL (ref 0.0–0.1)
EOS ABS: 0 10*3/uL (ref 0.0–0.5)
EOS%: 0.3 % (ref 0.0–7.0)
HCT: 41.3 % (ref 34.8–46.6)
HEMOGLOBIN: 12.6 g/dL (ref 11.6–15.9)
LYMPH%: 22.3 % (ref 14.0–49.7)
MCH: 27.8 pg (ref 25.1–34.0)
MCHC: 30.5 g/dL — ABNORMAL LOW (ref 31.5–36.0)
MCV: 91 fL (ref 79.5–101.0)
MONO#: 0.8 10*3/uL (ref 0.1–0.9)
MONO%: 8.3 % (ref 0.0–14.0)
NEUT%: 68.8 % (ref 38.4–76.8)
NEUTROS ABS: 6.4 10*3/uL (ref 1.5–6.5)
PLATELETS: 284 10*3/uL (ref 145–400)
RBC: 4.54 10*6/uL (ref 3.70–5.45)
RDW: 20.7 % — AB (ref 11.2–14.5)
WBC: 9.3 10*3/uL (ref 3.9–10.3)
lymph#: 2.1 10*3/uL (ref 0.9–3.3)

## 2014-12-17 MED ORDER — SODIUM CHLORIDE 0.9 % IV SOLN
420.0000 mg | Freq: Once | INTRAVENOUS | Status: AC
  Administered 2014-12-17: 420 mg via INTRAVENOUS
  Filled 2014-12-17: qty 14

## 2014-12-17 MED ORDER — SODIUM CHLORIDE 0.9 % IJ SOLN
10.0000 mL | INTRAMUSCULAR | Status: DC | PRN
  Administered 2014-12-17: 10 mL
  Filled 2014-12-17: qty 10

## 2014-12-17 MED ORDER — DIPHENHYDRAMINE HCL 25 MG PO CAPS
ORAL_CAPSULE | ORAL | Status: AC
  Filled 2014-12-17: qty 1

## 2014-12-17 MED ORDER — DIPHENHYDRAMINE HCL 25 MG PO CAPS
25.0000 mg | ORAL_CAPSULE | Freq: Once | ORAL | Status: AC
  Administered 2014-12-17: 25 mg via ORAL

## 2014-12-17 MED ORDER — ACETAMINOPHEN 325 MG PO TABS
ORAL_TABLET | ORAL | Status: AC
Start: 2014-12-17 — End: 2014-12-17
  Filled 2014-12-17: qty 2

## 2014-12-17 MED ORDER — HEPARIN SOD (PORK) LOCK FLUSH 100 UNIT/ML IV SOLN
500.0000 [IU] | Freq: Once | INTRAVENOUS | Status: AC | PRN
  Administered 2014-12-17: 500 [IU]
  Filled 2014-12-17: qty 5

## 2014-12-17 MED ORDER — TRASTUZUMAB CHEMO INJECTION 440 MG
6.0000 mg/kg | Freq: Once | INTRAVENOUS | Status: AC
  Administered 2014-12-17: 630 mg via INTRAVENOUS
  Filled 2014-12-17: qty 30

## 2014-12-17 MED ORDER — ACETAMINOPHEN 325 MG PO TABS
650.0000 mg | ORAL_TABLET | Freq: Once | ORAL | Status: AC
  Administered 2014-12-17: 650 mg via ORAL

## 2014-12-17 MED ORDER — SODIUM CHLORIDE 0.9 % IV SOLN
Freq: Once | INTRAVENOUS | Status: AC
  Administered 2014-12-17: 09:00:00 via INTRAVENOUS

## 2014-12-17 NOTE — Progress Notes (Signed)
No pain, but intermittent muscle soreness in both calves.  Improves after she moves around.

## 2014-12-17 NOTE — Patient Instructions (Signed)
[facility] Discharge Instructions for Patients Receiving Chemotherapy  Today you received the following chemotherapy agents Herceptin, Perjeta    To help prevent nausea and vomiting after your treatment, we encourage you to take your nausea medication as directed.    If you develop nausea and vomiting that is not controlled by your nausea medication, call the clinic.   BELOW ARE SYMPTOMS THAT SHOULD BE REPORTED IMMEDIATELY:  *FEVER GREATER THAN 100.5 F  *CHILLS WITH OR WITHOUT FEVER  NAUSEA AND VOMITING THAT IS NOT CONTROLLED WITH YOUR NAUSEA MEDICATION  *UNUSUAL SHORTNESS OF BREATH  *UNUSUAL BRUISING OR BLEEDING  TENDERNESS IN MOUTH AND THROAT WITH OR WITHOUT PRESENCE OF ULCERS  *URINARY PROBLEMS  *BOWEL PROBLEMS  UNUSUAL RASH Items with * indicate a potential emergency and should be followed up as soon as possible.  Feel free to call the clinic you have any questions or concerns. The clinic phone number is [phone].  Please show the CHEMO ALERT CARD at check-in to the Emergency Department and triage nurse.   

## 2015-01-06 ENCOUNTER — Other Ambulatory Visit: Payer: Self-pay | Admitting: *Deleted

## 2015-01-06 DIAGNOSIS — C50411 Malignant neoplasm of upper-outer quadrant of right female breast: Secondary | ICD-10-CM

## 2015-01-06 MED ORDER — CARVEDILOL 6.25 MG PO TABS: 6.2500 mg | ORAL_TABLET | Freq: Two times a day (BID) | ORAL | Status: DC

## 2015-01-07 ENCOUNTER — Ambulatory Visit (HOSPITAL_BASED_OUTPATIENT_CLINIC_OR_DEPARTMENT_OTHER): Payer: 59

## 2015-01-07 ENCOUNTER — Telehealth: Payer: Self-pay | Admitting: Hematology and Oncology

## 2015-01-07 ENCOUNTER — Other Ambulatory Visit: Payer: Self-pay | Admitting: Hematology and Oncology

## 2015-01-07 ENCOUNTER — Other Ambulatory Visit (HOSPITAL_BASED_OUTPATIENT_CLINIC_OR_DEPARTMENT_OTHER): Payer: 59

## 2015-01-07 VITALS — BP 142/82 | HR 78 | Temp 98.3°F | Resp 18

## 2015-01-07 DIAGNOSIS — C7951 Secondary malignant neoplasm of bone: Secondary | ICD-10-CM | POA: Diagnosis not present

## 2015-01-07 DIAGNOSIS — C50411 Malignant neoplasm of upper-outer quadrant of right female breast: Secondary | ICD-10-CM | POA: Diagnosis not present

## 2015-01-07 DIAGNOSIS — Z5112 Encounter for antineoplastic immunotherapy: Secondary | ICD-10-CM

## 2015-01-07 LAB — COMPREHENSIVE METABOLIC PANEL
ALBUMIN: 3.1 g/dL — AB (ref 3.5–5.0)
ALK PHOS: 88 U/L (ref 40–150)
ALT: 11 U/L (ref 0–55)
AST: 14 U/L (ref 5–34)
Anion Gap: 10 mEq/L (ref 3–11)
BILIRUBIN TOTAL: 0.49 mg/dL (ref 0.20–1.20)
BUN: 14 mg/dL (ref 7.0–26.0)
CO2: 21 mEq/L — ABNORMAL LOW (ref 22–29)
Calcium: 9.3 mg/dL (ref 8.4–10.4)
Chloride: 109 mEq/L (ref 98–109)
Creatinine: 0.8 mg/dL (ref 0.6–1.1)
EGFR: 81 mL/min/{1.73_m2} — ABNORMAL LOW (ref >90)
GLUCOSE: 159 mg/dL — AB (ref 70–140)
POTASSIUM: 4 meq/L (ref 3.5–5.1)
SODIUM: 140 meq/L (ref 136–145)
TOTAL PROTEIN: 6.5 g/dL (ref 6.4–8.3)

## 2015-01-07 LAB — CBC WITH DIFFERENTIAL/PLATELET
BASO%: 0.7 % (ref 0.0–2.0)
Basophils Absolute: 0.1 10*3/uL (ref 0.0–0.1)
EOS%: 1.2 % (ref 0.0–7.0)
Eosinophils Absolute: 0.1 10*3/uL (ref 0.0–0.5)
HCT: 42.1 % (ref 34.8–46.6)
HEMOGLOBIN: 13.1 g/dL (ref 11.6–15.9)
LYMPH%: 26.6 % (ref 14.0–49.7)
MCH: 27.4 pg (ref 25.1–34.0)
MCHC: 31.1 g/dL — ABNORMAL LOW (ref 31.5–36.0)
MCV: 88.2 fL (ref 79.5–101.0)
MONO#: 0.6 10*3/uL (ref 0.1–0.9)
MONO%: 7.1 % (ref 0.0–14.0)
NEUT%: 64.4 % (ref 38.4–76.8)
NEUTROS ABS: 5 10*3/uL (ref 1.5–6.5)
Platelets: 238 10*3/uL (ref 145–400)
RBC: 4.78 10*6/uL (ref 3.70–5.45)
RDW: 19.1 % — ABNORMAL HIGH (ref 11.2–14.5)
WBC: 7.8 10*3/uL (ref 3.9–10.3)
lymph#: 2.1 10*3/uL (ref 0.9–3.3)

## 2015-01-07 MED ORDER — HEPARIN SOD (PORK) LOCK FLUSH 100 UNIT/ML IV SOLN
500.0000 [IU] | Freq: Once | INTRAVENOUS | Status: AC | PRN
  Administered 2015-01-07: 500 [IU]
  Filled 2015-01-07: qty 5

## 2015-01-07 MED ORDER — SODIUM CHLORIDE 0.9 % IV SOLN
Freq: Once | INTRAVENOUS | Status: AC
  Administered 2015-01-07: 09:00:00 via INTRAVENOUS

## 2015-01-07 MED ORDER — ACETAMINOPHEN 325 MG PO TABS
ORAL_TABLET | ORAL | Status: AC
  Filled 2015-01-07: qty 2

## 2015-01-07 MED ORDER — SODIUM CHLORIDE 0.9 % IV SOLN
420.0000 mg | Freq: Once | INTRAVENOUS | Status: AC
  Administered 2015-01-07: 420 mg via INTRAVENOUS
  Filled 2015-01-07: qty 14

## 2015-01-07 MED ORDER — ACETAMINOPHEN 325 MG PO TABS
650.0000 mg | ORAL_TABLET | Freq: Once | ORAL | Status: AC
  Administered 2015-01-07: 650 mg via ORAL

## 2015-01-07 MED ORDER — SODIUM CHLORIDE 0.9 % IJ SOLN
10.0000 mL | INTRAMUSCULAR | Status: DC | PRN
  Administered 2015-01-07: 10 mL
  Filled 2015-01-07: qty 10

## 2015-01-07 MED ORDER — DIPHENHYDRAMINE HCL 25 MG PO CAPS
25.0000 mg | ORAL_CAPSULE | Freq: Once | ORAL | Status: AC
  Administered 2015-01-07: 25 mg via ORAL

## 2015-01-07 MED ORDER — TRASTUZUMAB CHEMO INJECTION 440 MG
6.0000 mg/kg | Freq: Once | INTRAVENOUS | Status: AC
  Administered 2015-01-07: 630 mg via INTRAVENOUS
  Filled 2015-01-07: qty 30

## 2015-01-07 MED ORDER — DIPHENHYDRAMINE HCL 25 MG PO CAPS
ORAL_CAPSULE | ORAL | Status: AC
Start: 2015-01-07 — End: 2015-01-07
  Filled 2015-01-07: qty 1

## 2015-01-07 NOTE — Telephone Encounter (Signed)
MD VISIT ADDED AND PATIENT WILL GET A NEW AVS TODAY ION CHEMO

## 2015-01-07 NOTE — Patient Instructions (Signed)
[facility] Discharge Instructions for Patients Receiving Chemotherapy  Today you received the following chemotherapy agents herceptin/perjeta  To help prevent nausea and vomiting after your treatment, we encourage you to take your nausea medication as directed   If you develop nausea and vomiting that is not controlled by your nausea medication, call the clinic.   BELOW ARE SYMPTOMS THAT SHOULD BE REPORTED IMMEDIATELY:  *FEVER GREATER THAN 100.5 F  *CHILLS WITH OR WITHOUT FEVER  NAUSEA AND VOMITING THAT IS NOT CONTROLLED WITH YOUR NAUSEA MEDICATION  *UNUSUAL SHORTNESS OF BREATH  *UNUSUAL BRUISING OR BLEEDING  TENDERNESS IN MOUTH AND THROAT WITH OR WITHOUT PRESENCE OF ULCERS  *URINARY PROBLEMS  *BOWEL PROBLEMS  UNUSUAL RASH Items with * indicate a potential emergency and should be followed up as soon as possible.  Feel free to call the clinic you have any questions or concerns. The clinic phone number is [phone].  

## 2015-01-27 NOTE — Assessment & Plan Note (Signed)
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 In addition to Right breast, hypermetabolic disease in Rt Supraclav LN, axilla and mediastinum, bulky disease in liver, Pulm Mets, Path fracture 9th rib   Treatment summary:Taxol weekly(started 09/10/2014 )Herceptin Perjeta every 3 weeks(started 09/03/2014) Received 10 Weeks Taxol, Herceptin Perjeta being given once every 3 weeks (8/17 and 9/7 and 9/28, 10/19) ; changed chemo from Taxol to Abraxane for bronchospasm 11/19/14 and completed 12/03/14   PET CT scan 12/09/2014: Complete response in the liver, marked improvement in metastatic disease in the right breast and right axilla, improvement in bone metastases, decrease in lymphadenopathy throughout. Resolution of lung nodules.  Current Treatment: Herceptin Perjeta maintenance started 12/17/14  Neuropathy: Supportive care Shortness of breath: Improved.  RTC in 6 weeks and in 3 weeks for herceptin Perjeta.

## 2015-01-28 ENCOUNTER — Ambulatory Visit (HOSPITAL_BASED_OUTPATIENT_CLINIC_OR_DEPARTMENT_OTHER): Payer: BLUE CROSS/BLUE SHIELD | Admitting: Hematology and Oncology

## 2015-01-28 ENCOUNTER — Encounter: Payer: Self-pay | Admitting: Hematology and Oncology

## 2015-01-28 ENCOUNTER — Other Ambulatory Visit (HOSPITAL_BASED_OUTPATIENT_CLINIC_OR_DEPARTMENT_OTHER): Payer: BLUE CROSS/BLUE SHIELD

## 2015-01-28 ENCOUNTER — Telehealth: Payer: Self-pay | Admitting: Hematology and Oncology

## 2015-01-28 ENCOUNTER — Ambulatory Visit (HOSPITAL_BASED_OUTPATIENT_CLINIC_OR_DEPARTMENT_OTHER): Payer: BLUE CROSS/BLUE SHIELD

## 2015-01-28 ENCOUNTER — Other Ambulatory Visit: Payer: Self-pay | Admitting: Hematology and Oncology

## 2015-01-28 ENCOUNTER — Ambulatory Visit: Payer: 59

## 2015-01-28 VITALS — BP 141/72 | HR 84 | Temp 98.8°F | Resp 18 | Ht 65.5 in | Wt 223.5 lb

## 2015-01-28 DIAGNOSIS — Z5112 Encounter for antineoplastic immunotherapy: Secondary | ICD-10-CM

## 2015-01-28 DIAGNOSIS — C7802 Secondary malignant neoplasm of left lung: Secondary | ICD-10-CM

## 2015-01-28 DIAGNOSIS — C778 Secondary and unspecified malignant neoplasm of lymph nodes of multiple regions: Secondary | ICD-10-CM

## 2015-01-28 DIAGNOSIS — Z171 Estrogen receptor negative status [ER-]: Secondary | ICD-10-CM

## 2015-01-28 DIAGNOSIS — C50411 Malignant neoplasm of upper-outer quadrant of right female breast: Secondary | ICD-10-CM

## 2015-01-28 DIAGNOSIS — C787 Secondary malignant neoplasm of liver and intrahepatic bile duct: Secondary | ICD-10-CM

## 2015-01-28 DIAGNOSIS — C7951 Secondary malignant neoplasm of bone: Secondary | ICD-10-CM

## 2015-01-28 DIAGNOSIS — G620 Drug-induced polyneuropathy: Secondary | ICD-10-CM

## 2015-01-28 DIAGNOSIS — C7800 Secondary malignant neoplasm of unspecified lung: Secondary | ICD-10-CM

## 2015-01-28 LAB — CBC WITH DIFFERENTIAL/PLATELET
BASO%: 0.2 % (ref 0.0–2.0)
Basophils Absolute: 0 10*3/uL (ref 0.0–0.1)
EOS%: 0.6 % (ref 0.0–7.0)
Eosinophils Absolute: 0.1 10*3/uL (ref 0.0–0.5)
HCT: 46.1 % (ref 34.8–46.6)
HGB: 14.6 g/dL (ref 11.6–15.9)
LYMPH%: 31 % (ref 14.0–49.7)
MCH: 28.3 pg (ref 25.1–34.0)
MCHC: 31.7 g/dL (ref 31.5–36.0)
MCV: 89.5 fL (ref 79.5–101.0)
MONO#: 0.6 10*3/uL (ref 0.1–0.9)
MONO%: 6.7 % (ref 0.0–14.0)
NEUT#: 5.2 10*3/uL (ref 1.5–6.5)
NEUT%: 61.5 % (ref 38.4–76.8)
Platelets: 261 10*3/uL (ref 145–400)
RBC: 5.15 10*6/uL (ref 3.70–5.45)
RDW: 16.2 % — ABNORMAL HIGH (ref 11.2–14.5)
WBC: 8.4 10*3/uL (ref 3.9–10.3)
lymph#: 2.6 10*3/uL (ref 0.9–3.3)

## 2015-01-28 LAB — COMPREHENSIVE METABOLIC PANEL
ALT: 14 U/L (ref 0–55)
ANION GAP: 9 meq/L (ref 3–11)
AST: 15 U/L (ref 5–34)
Albumin: 3.5 g/dL (ref 3.5–5.0)
Alkaline Phosphatase: 99 U/L (ref 40–150)
BUN: 22.3 mg/dL (ref 7.0–26.0)
CALCIUM: 9.5 mg/dL (ref 8.4–10.4)
CHLORIDE: 109 meq/L (ref 98–109)
CO2: 21 meq/L — AB (ref 22–29)
CREATININE: 0.8 mg/dL (ref 0.6–1.1)
EGFR: 73 mL/min/{1.73_m2} — ABNORMAL LOW (ref >90)
Glucose: 159 mg/dl — ABNORMAL HIGH (ref 70–140)
POTASSIUM: 4.4 meq/L (ref 3.5–5.1)
Sodium: 140 mEq/L (ref 136–145)
Total Bilirubin: 0.3 mg/dL (ref 0.20–1.20)
Total Protein: 7.4 g/dL (ref 6.4–8.3)

## 2015-01-28 MED ORDER — SODIUM CHLORIDE 0.9 % IV SOLN
Freq: Once | INTRAVENOUS | Status: AC
  Administered 2015-01-28: 09:00:00 via INTRAVENOUS

## 2015-01-28 MED ORDER — DIPHENHYDRAMINE HCL 25 MG PO CAPS
ORAL_CAPSULE | ORAL | Status: AC
  Filled 2015-01-28: qty 1

## 2015-01-28 MED ORDER — HEPARIN SOD (PORK) LOCK FLUSH 100 UNIT/ML IV SOLN
500.0000 [IU] | Freq: Once | INTRAVENOUS | Status: AC | PRN
  Administered 2015-01-28: 500 [IU]
  Filled 2015-01-28: qty 5

## 2015-01-28 MED ORDER — SODIUM CHLORIDE 0.9 % IV SOLN
420.0000 mg | Freq: Once | INTRAVENOUS | Status: AC
  Administered 2015-01-28: 420 mg via INTRAVENOUS
  Filled 2015-01-28: qty 14

## 2015-01-28 MED ORDER — ACETAMINOPHEN 325 MG PO TABS
650.0000 mg | ORAL_TABLET | Freq: Once | ORAL | Status: AC
  Administered 2015-01-28: 650 mg via ORAL

## 2015-01-28 MED ORDER — DIPHENHYDRAMINE HCL 25 MG PO CAPS
25.0000 mg | ORAL_CAPSULE | Freq: Once | ORAL | Status: AC
  Administered 2015-01-28: 25 mg via ORAL

## 2015-01-28 MED ORDER — ACETAMINOPHEN 325 MG PO TABS
ORAL_TABLET | ORAL | Status: AC
  Filled 2015-01-28: qty 2

## 2015-01-28 MED ORDER — SODIUM CHLORIDE 0.9 % IJ SOLN
10.0000 mL | INTRAMUSCULAR | Status: DC | PRN
  Administered 2015-01-28: 10 mL
  Filled 2015-01-28: qty 10

## 2015-01-28 MED ORDER — SODIUM CHLORIDE 0.9 % IV SOLN
6.0000 mg/kg | Freq: Once | INTRAVENOUS | Status: AC
  Administered 2015-01-28: 630 mg via INTRAVENOUS
  Filled 2015-01-28: qty 30

## 2015-01-28 NOTE — Progress Notes (Signed)
Patient Care Team: Seward Carol, MD as PCP - General (Internal Medicine)  DIAGNOSIS: metastatic breast cancer with liver, lung, left sixth rib metastases  SUMMARY OF ONCOLOGIC HISTORY:   Breast cancer of upper-outer quadrant of right female breast ([place])   08/15/2014 Initial Diagnosis Right Breast Inflammatory invasive ductal cancer: 17 cm by ultrasound along with 13 cm ulceration, node also positive T4N1 (stage 3C) ER 0%, PR 0%, Her-2 Pending   08/22/2014 PET scan Metastatic disease. In addition to Right breast, hypermetabolic disease in Rt Supraclav LN, axilla and mediastinum, bulky disease in liver, Pulm Mets, Path fracture 9th rib   09/03/2014 - 11/26/2014 Chemotherapy Herceptin Perjeta started 09/03/2014, Taxol weekly added 09/10/2014   10/17/2014 Imaging CT scans: Decrease in right axillary lymph node, decreased skin thickening of the right breast, decreased in bilateral pulmonary nodules, decrease in liver metastases   12/09/2014 PET scan Remarkable response to chemotherapy resolution of liver metastases, marked improvement in the tumor in the breast, marked improvement in right axillary lymph nodes, resolution of lung nodules, improvement in left sixth rib   12/17/2014 -  Chemotherapy Herceptin and Perjeta maintenance every 3 weeks    CHIEF COMPLIANT: F/U on herceptin and perjeta  INTERVAL HISTORY: Denise Morton is a 65 yr old lady with met breast cancer currently on palliative treatment with Herceptin and Perjeta. She had received 6 cycles of chemotherapy with Herceptin and Perjeta and had a remarkable response to treatment based upon CT scans done in November 2016. Since of November, she is currently on Herceptin and Perjeta maintenance appears to be tolerating it extremely well. She denies any new concerns or complaints. Denies any nausea vomiting. Denies any diarrhea. Breasts ulcer is slowly improving  REVIEW OF SYSTEMS:   Constitutional: Denies fevers, chills or abnormal weight loss,  hair is starting to grow back Eyes: Denies blurriness of vision Ears, nose, mouth, throat, and face: Denies mucositis or sore throat Respiratory: Denies cough, dyspnea or wheezes Cardiovascular: Denies palpitation, chest discomfort Gastrointestinal:  Denies nausea, heartburn or change in bowel habits Skin: Denies abnormal skin rashes Lymphatics: Denies new lymphadenopathy or easy bruising Neurological:Denies numbness, tingling or new weaknesses Behavioral/Psych: Mood is stable, no new changes  Extremities: No lower extremity edema All other systems were reviewed with the patient and are negative.  I have reviewed the past medical history, past surgical history, social history and family history with the patient and they are unchanged from previous note.  ALLERGIES:  is allergic to penicillin g and sulfa antibiotics.  MEDICATIONS:  Current Outpatient Prescriptions  Medication Sig Dispense Refill  . ALPRAZolam (XANAX) 0.25 MG tablet Take 0.25 mg by mouth daily as needed for anxiety.    . carvedilol (COREG) 6.25 MG tablet Take 1 tablet (6.25 mg total) by mouth 2 (two) times daily with a meal. 180 tablet 3  . lidocaine-prilocaine (EMLA) cream Apply to port 1-2 hours before procedure 30 g 1  . loperamide (IMODIUM) 2 MG capsule Take 2 mg by mouth as needed for diarrhea or loose stools.    . Multiple Vitamin (MULTIVITAMIN WITH MINERALS) TABS tablet Take 1 tablet by mouth daily.    . nicotine polacrilex (COMMIT) 2 MG lozenge Take 2 mg by mouth as needed for smoking cessation.    . ondansetron (ZOFRAN) 8 MG tablet TAKE ONE TABLET BY MOUTH TWICE DAILY. START THE DAY AFTER CHEMO FOR TWO DAYS. THEN TAKE AS NEEDED FOR NAUSEA OR FOR VOMITING  1  . prochlorperazine (COMPAZINE) 10 MG tablet TAKE ONE  TABLET BY MOUTH EVERY 6 HOURS AS NEEDED FOR NAUSEA OR FOR VOMITING  1  . spironolactone (ALDACTONE) 25 MG tablet Take 0.5 tablets (12.5 mg total) by mouth daily. 45 tablet 3   No current  facility-administered medications for this visit.    PHYSICAL EXAMINATION: ECOG PERFORMANCE STATUS: 1 - Symptomatic but completely ambulatory  Filed Vitals:   01/28/15 0818  BP: 141/72  Pulse: 84  Temp: 98.8 F (37.1 C)  Resp: 18   Filed Weights   01/28/15 0818  Weight: 223 lb 8 oz (101.379 kg)    GENERAL:alert, no distress and comfortable SKIN: skin color, texture, turgor are normal, no rashes or significant lesions EYES: normal, Conjunctiva are pink and non-injected, sclera clear OROPHARYNX:no exudate, no erythema and lips, buccal mucosa, and tongue normal  NECK: supple, thyroid normal size, non-tender, without nodularity LYMPH:  no palpable lymphadenopathy in the cervical, axillary or inguinal LUNGS: clear to auscultation and percussion with normal breathing effort HEART: regular rate & rhythm and no murmurs and no lower extremity edema ABDOMEN:abdomen soft, non-tender and normal bowel sounds MUSCULOSKELETAL:no cyanosis of digits and no clubbing  NEURO: alert & oriented x 3 with fluent speech, no focal motor/sensory deficits EXTREMITIES: No lower extremity edema  LABORATORY DATA:  I have reviewed the data as listed   Chemistry      Component Value Date/Time   NA 140 01/07/2015 0759   NA 140 12/03/2006 1854   K 4.0 01/07/2015 0759   K 3.8 12/03/2006 1854   CL 108 12/03/2006 1854   CO2 21* 01/07/2015 0759   BUN 14.0 01/07/2015 0759   BUN 21 12/03/2006 1854   CREATININE 0.8 01/07/2015 0759   CREATININE 1.0 12/03/2006 1854      Component Value Date/Time   CALCIUM 9.3 01/07/2015 0759   ALKPHOS 88 01/07/2015 0759   AST 14 01/07/2015 0759   ALT 11 01/07/2015 0759   BILITOT 0.49 01/07/2015 0759       Lab Results  Component Value Date   WBC 8.4 01/28/2015   HGB 14.6 01/28/2015   HCT 46.1 01/28/2015   MCV 89.5 01/28/2015   PLT 261 01/28/2015   NEUTROABS 5.2 01/28/2015     ASSESSMENT & PLAN:  Breast cancer of upper-outer quadrant of right female  breast 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 In addition to Right breast, hypermetabolic disease in Rt Supraclav LN, axilla and mediastinum, bulky disease in liver, Pulm Mets, Path fracture 9th rib   Treatment summary:Taxol weekly(started 09/10/2014 )Herceptin Perjeta every 3 weeks(started 09/03/2014) Received 10 Weeks Taxol, Herceptin Perjeta given once every 3 weeks (8/17 and 9/7 and 9/28, 10/19) ; changed chemo from Taxol to Abraxane for bronchospasm 11/19/14 and completed 12/03/14   PET CT scan 12/09/2014: Complete response in the liver, marked improvement in metastatic disease in the right breast and right axilla, improvement in bone metastases, decrease in lymphadenopathy throughout. Resolution of lung nodules.  Current Treatment: Herceptin Perjeta maintenance started 12/17/14 Chemotherapy toxicities: 1. 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 Slowly improving. Shortness of breath: Improved.  Plan is to obtain a CT chest abdomen pelvis with contrast on 03/10/2015 and follow-up on 03/11/2015 Monitoring closely for chemotherapy toxicities  RTC in 6 weeks and in 3 weeks for herceptin Perjeta.  Orders Placed This Encounter  Procedures  . CT  Abdomen Pelvis W Contrast    Standing Status: Future     Number of Occurrences:      Standing Expiration Date: 01/28/2016    Order Specific Question:  If indicated for the ordered procedure, I authorize the administration of contrast media per Radiology protocol    Answer:  Yes    Order Specific Question:  Reason for Exam (SYMPTOM  OR DIAGNOSIS REQUIRED)    Answer:  Metastatic breast cancer restaging    Order Specific Question:  Preferred imaging location?    Answer:  [facility]  . CT Chest W Contrast    Standing  Status: Future     Number of Occurrences:      Standing Expiration Date: 01/28/2016    Order Specific Question:  If indicated for the ordered procedure, I authorize the administration of contrast media per Radiology protocol    Answer:  Yes    Order Specific Question:  Reason for Exam (SYMPTOM  OR DIAGNOSIS REQUIRED)    Answer:  Metastatic breast cancer restaging    Order Specific Question:  Preferred imaging location?    Answer:  [facility]   The patient has a good understanding of the overall plan. she agrees with it. she will call with any problems that may develop before the next visit here.   Rulon Eisenmenger, MD 01/28/2015

## 2015-01-28 NOTE — Addendum Note (Signed)
Addended by: Prentiss Bells on: 01/28/2015 09:21 AM   Modules accepted: Medications

## 2015-01-28 NOTE — Telephone Encounter (Signed)
Appointments made and avs will be printed in chemo  °

## 2015-01-28 NOTE — Patient Instructions (Signed)
Trastuzumab injection for infusion What is this medicine? TRASTUZUMAB (tras TOO zoo mab) is a monoclonal antibody. It is used to treat breast cancer and stomach cancer. This medicine may be used for other purposes; ask your health care provider or pharmacist if you have questions. What should I tell my health care provider before I take this medicine? They need to know if you have any of these conditions: -heart disease -heart failure -infection (especially a virus infection such as chickenpox, cold sores, or herpes) -lung or breathing disease, like asthma -recent or ongoing radiation therapy -an unusual or allergic reaction to trastuzumab, benzyl alcohol, or other medications, foods, dyes, or preservatives -pregnant or trying to get pregnant -breast-feeding How should I use this medicine? This drug is given as an infusion into a vein. It is administered in a hospital or clinic by a specially trained health care professional. Talk to your pediatrician regarding the use of this medicine in children. This medicine is not approved for use in children. Overdosage: If you think you have taken too much of this medicine contact a poison control center or emergency room at once. NOTE: This medicine is only for you. Do not share this medicine with others. What if I miss a dose? It is important not to miss a dose. Call your doctor or health care professional if you are unable to keep an appointment. What may interact with this medicine? -doxorubicin -warfarin This list may not describe all possible interactions. Give your health care provider a list of all the medicines, herbs, non-prescription drugs, or dietary supplements you use. Also tell them if you smoke, drink alcohol, or use illegal drugs. Some items may interact with your medicine. What should I watch for while using this medicine? Visit your doctor for checks on your progress. Report any side effects. Continue your course of treatment even  though you feel ill unless your doctor tells you to stop. Call your doctor or health care professional for advice if you get a fever, chills or sore throat, or other symptoms of a cold or flu. Do not treat yourself. Try to avoid being around people who are sick. You may experience fever, chills and shaking during your first infusion. These effects are usually mild and can be treated with other medicines. Report any side effects during the infusion to your health care professional. Fever and chills usually do not happen with later infusions. Do not become pregnant while taking this medicine or for 7 months after stopping it. Women should inform their doctor if they wish to become pregnant or think they might be pregnant. Women of child-bearing potential will need to have a negative pregnancy test before starting this medicine. There is a potential for serious side effects to an unborn child. Talk to your health care professional or pharmacist for more information. Do not breast-feed an infant while taking this medicine or for 7 months after stopping it. Women must use effective birth control with this medicine. What side effects may I notice from receiving this medicine? Side effects that you should report to your doctor or other health care professional as soon as possible: -breathing difficulties -chest pain or palpitations -cough -dizziness or fainting -fever or chills, sore throat -skin rash, itching or hives -swelling of the legs or ankles -unusually weak or tired Side effects that usually do not require medical attention (report to your doctor or other health care professional if they continue or are bothersome): -loss of appetite -headache -muscle aches -nausea This   list may not describe all possible side effects. Call your doctor for medical advice about side effects. You may report side effects to FDA at 1-800-FDA-1088. Where should I keep my medicine? This drug is given in a hospital  or clinic and will not be stored at home. NOTE: This sheet is a summary. It may not cover all possible information. If you have questions about this medicine, talk to your doctor, pharmacist, or health care provider.    2016, Elsevier/Gold Standard. (2014-04-11 11:49:32) Pertuzumab injection What is this medicine? PERTUZUMAB (per TOOZ ue mab) is a monoclonal antibody. It is used to treat breast cancer. This medicine may be used for other purposes; ask your health care provider or pharmacist if you have questions. What should I tell my health care provider before I take this medicine? They need to know if you have any of these conditions: -heart disease -heart failure -high blood pressure -history of irregular heart beat -recent or ongoing radiation therapy -an unusual or allergic reaction to pertuzumab, other medicines, foods, dyes, or preservatives -pregnant or trying to get pregnant -breast-feeding How should I use this medicine? This medicine is for infusion into a vein. It is given by a health care professional in a hospital or clinic setting. Talk to your pediatrician regarding the use of this medicine in children. Special care may be needed. Overdosage: If you think you have taken too much of this medicine contact a poison control center or emergency room at once. NOTE: This medicine is only for you. Do not share this medicine with others. What if I miss a dose? It is important not to miss your dose. Call your doctor or health care professional if you are unable to keep an appointment. What may interact with this medicine? Interactions are not expected. Give your health care provider a list of all the medicines, herbs, non-prescription drugs, or dietary supplements you use. Also tell them if you smoke, drink alcohol, or use illegal drugs. Some items may interact with your medicine. This list may not describe all possible interactions. Give your health care provider a list of all  the medicines, herbs, non-prescription drugs, or dietary supplements you use. Also tell them if you smoke, drink alcohol, or use illegal drugs. Some items may interact with your medicine. What should I watch for while using this medicine? Your condition will be monitored carefully while you are receiving this medicine. Report any side effects. Continue your course of treatment even though you feel ill unless your doctor tells you to stop. Do not become pregnant while taking this medicine or for 7 months after stopping it. Women should inform their doctor if they wish to become pregnant or think they might be pregnant. Women of child-bearing potential will need to have a negative pregnancy test before starting this medicine. There is a potential for serious side effects to an unborn child. Talk to your health care professional or pharmacist for more information. Do not breast-feed an infant while taking this medicine or for 7 months after stopping it. Women must use effective birth control with this medicine. Call your doctor or health care professional for advice if you get a fever, chills or sore throat, or other symptoms of a cold or flu. Do not treat yourself. Try to avoid being around people who are sick. You may experience fever, chills, and headache during the infusion. Report any side effects during the infusion to your health care professional. What side effects may I notice from receiving  this medicine? Side effects that you should report to your doctor or health care professional as soon as possible: -breathing problems -chest pain or palpitations -dizziness -feeling faint or lightheaded -fever or chills -skin rash, itching or hives -sore throat -swelling of the face, lips, or tongue -swelling of the legs or ankles -unusually weak or tired Side effects that usually do not require medical attention (Report these to your doctor or health care professional if they continue or are  bothersome.): -diarrhea -hair loss -nausea, vomiting -tiredness This list may not describe all possible side effects. Call your doctor for medical advice about side effects. You may report side effects to FDA at 1-800-FDA-1088. Where should I keep my medicine? This drug is given in a hospital or clinic and will not be stored at home. NOTE: This sheet is a summary. It may not cover all possible information. If you have questions about this medicine, talk to your doctor, pharmacist, or health care provider.    2016, Elsevier/Gold Standard. (2014-04-11 16:07:57)

## 2015-01-29 DIAGNOSIS — C799 Secondary malignant neoplasm of unspecified site: Secondary | ICD-10-CM | POA: Insufficient documentation

## 2015-02-06 ENCOUNTER — Other Ambulatory Visit: Payer: Self-pay | Admitting: *Deleted

## 2015-02-06 ENCOUNTER — Telehealth: Payer: Self-pay | Admitting: Hematology and Oncology

## 2015-02-06 DIAGNOSIS — C50411 Malignant neoplasm of upper-outer quadrant of right female breast: Secondary | ICD-10-CM

## 2015-02-06 NOTE — Telephone Encounter (Signed)
Pof stated echo needed by 2/14. Information sent to [facility]. Informed Beverlee Nims that order was not present

## 2015-02-09 ENCOUNTER — Telehealth: Payer: Self-pay | Admitting: Hematology and Oncology

## 2015-02-09 ENCOUNTER — Other Ambulatory Visit: Payer: Self-pay | Admitting: *Deleted

## 2015-02-09 DIAGNOSIS — C50411 Malignant neoplasm of upper-outer quadrant of right female breast: Secondary | ICD-10-CM

## 2015-02-09 NOTE — Telephone Encounter (Signed)
Spoke to patient in regards to date/time of echo. Patient will call [facility] to r/s appointment.

## 2015-02-16 ENCOUNTER — Ambulatory Visit (HOSPITAL_COMMUNITY)
Admission: RE | Admit: 2015-02-16 | Discharge: 2015-02-16 | Disposition: A | Discharge status: Home / Self Care | Payer: BLUE CROSS/BLUE SHIELD | Source: Ambulatory Visit | Attending: Hematology and Oncology | Admitting: Hematology and Oncology

## 2015-02-16 ENCOUNTER — Other Ambulatory Visit (HOSPITAL_COMMUNITY): Payer: BLUE CROSS/BLUE SHIELD

## 2015-02-16 DIAGNOSIS — Z79899 Other long term (current) drug therapy: Secondary | ICD-10-CM | POA: Insufficient documentation

## 2015-02-16 DIAGNOSIS — C50411 Malignant neoplasm of upper-outer quadrant of right female breast: Secondary | ICD-10-CM | POA: Insufficient documentation

## 2015-02-16 DIAGNOSIS — C7951 Secondary malignant neoplasm of bone: Secondary | ICD-10-CM | POA: Insufficient documentation

## 2015-02-16 DIAGNOSIS — C778 Secondary and unspecified malignant neoplasm of lymph nodes of multiple regions: Secondary | ICD-10-CM | POA: Insufficient documentation

## 2015-02-16 NOTE — Progress Notes (Signed)
  Echocardiogram 2D Echocardiogram has been performed.  Denise Morton 02/16/2015, 10:17 AM

## 2015-02-18 ENCOUNTER — Ambulatory Visit (HOSPITAL_BASED_OUTPATIENT_CLINIC_OR_DEPARTMENT_OTHER): Payer: BLUE CROSS/BLUE SHIELD

## 2015-02-18 ENCOUNTER — Other Ambulatory Visit (HOSPITAL_BASED_OUTPATIENT_CLINIC_OR_DEPARTMENT_OTHER): Payer: BLUE CROSS/BLUE SHIELD

## 2015-02-18 VITALS — BP 170/97 | HR 86 | Temp 98.0°F | Resp 18

## 2015-02-18 DIAGNOSIS — C50411 Malignant neoplasm of upper-outer quadrant of right female breast: Secondary | ICD-10-CM

## 2015-02-18 DIAGNOSIS — Z5112 Encounter for antineoplastic immunotherapy: Secondary | ICD-10-CM

## 2015-02-18 LAB — CBC WITH DIFFERENTIAL/PLATELET
BASO%: 1.1 % (ref 0.0–2.0)
Basophils Absolute: 0.1 10*3/uL (ref 0.0–0.1)
EOS ABS: 0.1 10*3/uL (ref 0.0–0.5)
EOS%: 0.7 % (ref 0.0–7.0)
HCT: 48.1 % — ABNORMAL HIGH (ref 34.8–46.6)
HGB: 15.4 g/dL (ref 11.6–15.9)
LYMPH%: 32.5 % (ref 14.0–49.7)
MCH: 27.8 pg (ref 25.1–34.0)
MCHC: 31.9 g/dL (ref 31.5–36.0)
MCV: 86.9 fL (ref 79.5–101.0)
MONO#: 0.5 10*3/uL (ref 0.1–0.9)
MONO%: 6 % (ref 0.0–14.0)
NEUT%: 59.7 % (ref 38.4–76.8)
NEUTROS ABS: 4.7 10*3/uL (ref 1.5–6.5)
Platelets: 226 10*3/uL (ref 145–400)
RBC: 5.53 10*6/uL — AB (ref 3.70–5.45)
RDW: 16.1 % — AB (ref 11.2–14.5)
WBC: 7.8 10*3/uL (ref 3.9–10.3)
lymph#: 2.5 10*3/uL (ref 0.9–3.3)

## 2015-02-18 LAB — COMPREHENSIVE METABOLIC PANEL
ALT: 18 U/L (ref 0–55)
AST: 14 U/L (ref 5–34)
Albumin: 3.1 g/dL — ABNORMAL LOW (ref 3.5–5.0)
Alkaline Phosphatase: 93 U/L (ref 40–150)
Anion Gap: 9 mEq/L (ref 3–11)
BUN: 16.9 mg/dL (ref 7.0–26.0)
CO2: 22 meq/L (ref 22–29)
Calcium: 9.7 mg/dL (ref 8.4–10.4)
Chloride: 109 mEq/L (ref 98–109)
Creatinine: 0.8 mg/dL (ref 0.6–1.1)
EGFR: 78 mL/min/{1.73_m2} — AB (ref >90)
GLUCOSE: 165 mg/dL — AB (ref 70–140)
POTASSIUM: 4.1 meq/L (ref 3.5–5.1)
SODIUM: 140 meq/L (ref 136–145)
Total Bilirubin: 0.36 mg/dL (ref 0.20–1.20)
Total Protein: 6.8 g/dL (ref 6.4–8.3)

## 2015-02-18 MED ORDER — SODIUM CHLORIDE 0.9 % IV SOLN
Freq: Once | INTRAVENOUS | Status: AC
  Administered 2015-02-18: 09:00:00 via INTRAVENOUS

## 2015-02-18 MED ORDER — ACETAMINOPHEN 325 MG PO TABS
ORAL_TABLET | ORAL | Status: AC
  Filled 2015-02-18: qty 2

## 2015-02-18 MED ORDER — DIPHENHYDRAMINE HCL 25 MG PO CAPS
ORAL_CAPSULE | ORAL | Status: AC
  Filled 2015-02-18: qty 1

## 2015-02-18 MED ORDER — PERTUZUMAB CHEMO INJECTION 420 MG/14ML
420.0000 mg | Freq: Once | INTRAVENOUS | Status: AC
  Administered 2015-02-18: 420 mg via INTRAVENOUS
  Filled 2015-02-18: qty 14

## 2015-02-18 MED ORDER — HEPARIN SOD (PORK) LOCK FLUSH 100 UNIT/ML IV SOLN
500.0000 [IU] | Freq: Once | INTRAVENOUS | Status: AC | PRN
  Administered 2015-02-18: 500 [IU]
  Filled 2015-02-18: qty 5

## 2015-02-18 MED ORDER — SODIUM CHLORIDE 0.9 % IJ SOLN
10.0000 mL | INTRAMUSCULAR | Status: AC | PRN
  Administered 2015-02-18: 10 mL
  Filled 2015-02-18: qty 10

## 2015-02-18 MED ORDER — SODIUM CHLORIDE 0.9 % IV SOLN
6.0000 mg/kg | Freq: Once | INTRAVENOUS | Status: AC
  Administered 2015-02-18: 630 mg via INTRAVENOUS
  Filled 2015-02-18: qty 30

## 2015-02-18 MED ORDER — ACETAMINOPHEN 325 MG PO TABS
650.0000 mg | ORAL_TABLET | Freq: Once | ORAL | Status: AC
  Administered 2015-02-18: 650 mg via ORAL

## 2015-02-18 MED ORDER — DIPHENHYDRAMINE HCL 25 MG PO CAPS
25.0000 mg | ORAL_CAPSULE | Freq: Once | ORAL | Status: AC
Start: 2015-02-18 — End: 2015-02-18
  Administered 2015-02-18: 25 mg via ORAL

## 2015-02-18 NOTE — Progress Notes (Unsigned)
OK to treat with today's B/P per Dr. Lindi Adie.

## 2015-02-18 NOTE — Patient Instructions (Signed)
[facility] Discharge Instructions for Patients Receiving Chemotherapy  Today you received the following chemotherapy agents herceptin/perjeta  To help prevent nausea and vomiting after your treatment, we encourage you to take your nausea medication as directed   If you develop nausea and vomiting that is not controlled by your nausea medication, call the clinic.   BELOW ARE SYMPTOMS THAT SHOULD BE REPORTED IMMEDIATELY:  *FEVER GREATER THAN 100.5 F  *CHILLS WITH OR WITHOUT FEVER  NAUSEA AND VOMITING THAT IS NOT CONTROLLED WITH YOUR NAUSEA MEDICATION  *UNUSUAL SHORTNESS OF BREATH  *UNUSUAL BRUISING OR BLEEDING  TENDERNESS IN MOUTH AND THROAT WITH OR WITHOUT PRESENCE OF ULCERS  *URINARY PROBLEMS  *BOWEL PROBLEMS  UNUSUAL RASH Items with * indicate a potential emergency and should be followed up as soon as possible.  Feel free to call the clinic you have any questions or concerns. The clinic phone number is [phone].  

## 2015-03-10 ENCOUNTER — Ambulatory Visit (HOSPITAL_COMMUNITY)
Admission: RE | Admit: 2015-03-10 | Discharge: 2015-03-10 | Disposition: A | Discharge status: Home / Self Care | Payer: BLUE CROSS/BLUE SHIELD | Source: Ambulatory Visit | Attending: Hematology and Oncology | Admitting: Hematology and Oncology

## 2015-03-10 ENCOUNTER — Encounter (HOSPITAL_COMMUNITY): Payer: Self-pay

## 2015-03-10 DIAGNOSIS — C50411 Malignant neoplasm of upper-outer quadrant of right female breast: Secondary | ICD-10-CM | POA: Diagnosis present

## 2015-03-10 DIAGNOSIS — K769 Liver disease, unspecified: Secondary | ICD-10-CM | POA: Insufficient documentation

## 2015-03-10 DIAGNOSIS — N859 Noninflammatory disorder of uterus, unspecified: Secondary | ICD-10-CM | POA: Diagnosis not present

## 2015-03-10 HISTORY — DX: Malignant (primary) neoplasm, unspecified: C80.1

## 2015-03-10 MED ORDER — IOHEXOL 300 MG/ML  SOLN
100.0000 mL | Freq: Once | INTRAMUSCULAR | Status: AC | PRN
  Administered 2015-03-10: 100 mL via INTRAVENOUS

## 2015-03-10 NOTE — Assessment & Plan Note (Signed)
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 In addition to Right breast, hypermetabolic disease in Rt Supraclav LN, axilla and mediastinum, bulky disease in liver, Pulm Mets, Path fracture 9th rib   Treatment summary:Taxol weekly(started 09/10/2014 )Herceptin Perjeta every 3 weeks(started 09/03/2014) Received 10 Weeks Taxol, Herceptin Perjeta given once every 3 weeks (8/17 and 9/7 and 9/28, 10/19) ; changed chemo from Taxol to Abraxane for bronchospasm 11/19/14 and completed 12/03/14   PET CT scan 12/09/2014: Complete response in the liver, marked improvement in metastatic disease in the right breast and right axilla, improvement in bone metastases, decrease in lymphadenopathy throughout. Resolution of lung nodules.  Current Treatment: Herceptin Perjeta maintenance started 12/17/14 Chemotherapy toxicities: 1. 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 Slowly improving. Shortness of breath: Improved.  CT CAP 03/09/15: No new lung mets, stable ovoid nodule in post lateral right breast, No evidence of met disease in abd, low density lesions in liver are unchanged.  Plan: continue Herceptin Perjeta

## 2015-03-11 ENCOUNTER — Telehealth: Payer: Self-pay | Admitting: Hematology and Oncology

## 2015-03-11 ENCOUNTER — Other Ambulatory Visit (HOSPITAL_BASED_OUTPATIENT_CLINIC_OR_DEPARTMENT_OTHER): Payer: BLUE CROSS/BLUE SHIELD

## 2015-03-11 ENCOUNTER — Encounter: Payer: Self-pay | Admitting: Hematology and Oncology

## 2015-03-11 ENCOUNTER — Ambulatory Visit (HOSPITAL_BASED_OUTPATIENT_CLINIC_OR_DEPARTMENT_OTHER): Payer: BLUE CROSS/BLUE SHIELD | Admitting: Hematology and Oncology

## 2015-03-11 ENCOUNTER — Ambulatory Visit (HOSPITAL_BASED_OUTPATIENT_CLINIC_OR_DEPARTMENT_OTHER): Payer: BLUE CROSS/BLUE SHIELD

## 2015-03-11 VITALS — BP 189/99

## 2015-03-11 VITALS — BP 183/119 | HR 77 | Temp 98.7°F | Resp 18 | Wt 223.9 lb

## 2015-03-11 DIAGNOSIS — C7951 Secondary malignant neoplasm of bone: Secondary | ICD-10-CM

## 2015-03-11 DIAGNOSIS — C50411 Malignant neoplasm of upper-outer quadrant of right female breast: Secondary | ICD-10-CM | POA: Diagnosis not present

## 2015-03-11 DIAGNOSIS — C7800 Secondary malignant neoplasm of unspecified lung: Secondary | ICD-10-CM

## 2015-03-11 DIAGNOSIS — C787 Secondary malignant neoplasm of liver and intrahepatic bile duct: Secondary | ICD-10-CM | POA: Diagnosis not present

## 2015-03-11 DIAGNOSIS — I10 Essential (primary) hypertension: Secondary | ICD-10-CM

## 2015-03-11 DIAGNOSIS — G620 Drug-induced polyneuropathy: Secondary | ICD-10-CM

## 2015-03-11 DIAGNOSIS — C7802 Secondary malignant neoplasm of left lung: Secondary | ICD-10-CM

## 2015-03-11 DIAGNOSIS — Z5112 Encounter for antineoplastic immunotherapy: Secondary | ICD-10-CM

## 2015-03-11 DIAGNOSIS — C778 Secondary and unspecified malignant neoplasm of lymph nodes of multiple regions: Secondary | ICD-10-CM | POA: Diagnosis not present

## 2015-03-11 LAB — COMPREHENSIVE METABOLIC PANEL
ALBUMIN: 3.2 g/dL — AB (ref 3.5–5.0)
ALK PHOS: 92 U/L (ref 40–150)
ALT: 16 U/L (ref 0–55)
ANION GAP: 9 meq/L (ref 3–11)
AST: 14 U/L (ref 5–34)
BUN: 12.6 mg/dL (ref 7.0–26.0)
CALCIUM: 9.5 mg/dL (ref 8.4–10.4)
CO2: 24 mEq/L (ref 22–29)
Chloride: 108 mEq/L (ref 98–109)
Creatinine: 0.8 mg/dL (ref 0.6–1.1)
EGFR: 76 mL/min/{1.73_m2} — ABNORMAL LOW (ref >90)
Glucose: 135 mg/dl (ref 70–140)
POTASSIUM: 4.3 meq/L (ref 3.5–5.1)
Sodium: 142 mEq/L (ref 136–145)
Total Bilirubin: <0.3 mg/dL (ref 0.20–1.20)
Total Protein: 6.8 g/dL (ref 6.4–8.3)

## 2015-03-11 LAB — CBC WITH DIFFERENTIAL/PLATELET
BASO%: 0.2 % (ref 0.0–2.0)
BASOS ABS: 0 10*3/uL (ref 0.0–0.1)
EOS ABS: 0.1 10*3/uL (ref 0.0–0.5)
EOS%: 1.1 % (ref 0.0–7.0)
HEMATOCRIT: 47.6 % — AB (ref 34.8–46.6)
HEMOGLOBIN: 15.5 g/dL (ref 11.6–15.9)
LYMPH#: 1.4 10*3/uL (ref 0.9–3.3)
LYMPH%: 25.1 % (ref 14.0–49.7)
MCH: 28.5 pg (ref 25.1–34.0)
MCHC: 32.6 g/dL (ref 31.5–36.0)
MCV: 87.5 fL (ref 79.5–101.0)
MONO#: 0.5 10*3/uL (ref 0.1–0.9)
MONO%: 8 % (ref 0.0–14.0)
NEUT#: 3.7 10*3/uL (ref 1.5–6.5)
NEUT%: 65.6 % (ref 38.4–76.8)
PLATELETS: 215 10*3/uL (ref 145–400)
RBC: 5.44 10*6/uL (ref 3.70–5.45)
RDW: 15.4 % — AB (ref 11.2–14.5)
WBC: 5.6 10*3/uL (ref 3.9–10.3)

## 2015-03-11 MED ORDER — SODIUM CHLORIDE 0.9 % IV SOLN
Freq: Once | INTRAVENOUS | Status: AC
  Administered 2015-03-11: 09:00:00 via INTRAVENOUS

## 2015-03-11 MED ORDER — SODIUM CHLORIDE 0.9 % IV SOLN
420.0000 mg | Freq: Once | INTRAVENOUS | Status: AC
  Administered 2015-03-11: 420 mg via INTRAVENOUS
  Filled 2015-03-11: qty 14

## 2015-03-11 MED ORDER — SODIUM CHLORIDE 0.9 % IJ SOLN
10.0000 mL | INTRAMUSCULAR | Status: DC | PRN
  Administered 2015-03-11: 10 mL
  Filled 2015-03-11: qty 10

## 2015-03-11 MED ORDER — ACETAMINOPHEN 325 MG PO TABS
650.0000 mg | ORAL_TABLET | Freq: Once | ORAL | Status: AC
  Administered 2015-03-11: 650 mg via ORAL

## 2015-03-11 MED ORDER — HEPARIN SOD (PORK) LOCK FLUSH 100 UNIT/ML IV SOLN
500.0000 [IU] | Freq: Once | INTRAVENOUS | Status: AC | PRN
  Administered 2015-03-11: 500 [IU]
  Filled 2015-03-11: qty 5

## 2015-03-11 MED ORDER — DIPHENHYDRAMINE HCL 25 MG PO CAPS
25.0000 mg | ORAL_CAPSULE | Freq: Once | ORAL | Status: AC
  Administered 2015-03-11: 25 mg via ORAL

## 2015-03-11 MED ORDER — TRASTUZUMAB CHEMO INJECTION 440 MG
6.0000 mg/kg | Freq: Once | INTRAVENOUS | Status: AC
  Administered 2015-03-11: 630 mg via INTRAVENOUS
  Filled 2015-03-11: qty 30

## 2015-03-11 NOTE — Patient Instructions (Signed)
[facility] Discharge Instructions for Patients Receiving Chemotherapy  Today you received the following chemotherapy agents Herceptin/Perjeta.  To help prevent nausea and vomiting after your treatment, we encourage you to take your nausea medication as directed.    If you develop nausea and vomiting that is not controlled by your nausea medication, call the clinic.   BELOW ARE SYMPTOMS THAT SHOULD BE REPORTED IMMEDIATELY:  *FEVER GREATER THAN 100.5 F  *CHILLS WITH OR WITHOUT FEVER  NAUSEA AND VOMITING THAT IS NOT CONTROLLED WITH YOUR NAUSEA MEDICATION  *UNUSUAL SHORTNESS OF BREATH  *UNUSUAL BRUISING OR BLEEDING  TENDERNESS IN MOUTH AND THROAT WITH OR WITHOUT PRESENCE OF ULCERS  *URINARY PROBLEMS  *BOWEL PROBLEMS  UNUSUAL RASH Items with * indicate a potential emergency and should be followed up as soon as possible.  Feel free to call the clinic you have any questions or concerns. The clinic phone number is [phone].  Please show the CHEMO ALERT CARD at check-in to the Emergency Department and triage nurse.   

## 2015-03-11 NOTE — Progress Notes (Signed)
Patient Care Team: Seward Carol, MD as PCP - General (Internal Medicine)  DIAGNOSIS: Metastatic breast cancer  SUMMARY OF ONCOLOGIC HISTORY:   Breast cancer of upper-outer quadrant of right female breast ([place])   08/15/2014 Initial Diagnosis Right Breast Inflammatory invasive ductal cancer: 17 cm by ultrasound along with 13 cm ulceration, node also positive T4N1 (stage 3C) ER 0%, PR 0%, Her-2 Pending   08/22/2014 PET scan Metastatic disease. In addition to Right breast, hypermetabolic disease in Rt Supraclav LN, axilla and mediastinum, bulky disease in liver, Pulm Mets, Path fracture 9th rib   09/03/2014 - 11/26/2014 Chemotherapy Herceptin Perjeta started 09/03/2014, Taxol weekly added 09/10/2014   10/17/2014 Imaging CT scans: Decrease in right axillary lymph node, decreased skin thickening of the right breast, decreased in bilateral pulmonary nodules, decrease in liver metastases   12/09/2014 PET scan Remarkable response to chemotherapy resolution of liver metastases, marked improvement in the tumor in the breast, marked improvement in right axillary lymph nodes, resolution of lung nodules, improvement in left sixth rib   12/17/2014 -  Chemotherapy Herceptin and Perjeta maintenance every 3 weeks    CHIEF COMPLIANT: Follow-up of CT scans and Herceptin and Perjeta  INTERVAL HISTORY: Denise Morton is a 65 year old with above-mentioned history of metastatic breast cancer currently on Herceptin and Perjeta maintenance. She is tolerating the treatment extremely well. Her echocardiogram done recently was normal. She does not have any new side effects from treatment. She does have occasional bowel issues. Denies any nausea or vomiting. Overall she has lost about 15 pounds by watching her diet and today her blood pressure is extremely high. She does not know why her blood pressure is rising in spite of taking her blood pressure medications. She has not seen her primary care physician in a while.  REVIEW  OF SYSTEMS:   Constitutional: Denies fevers, chills or abnormal weight loss Eyes: Denies blurriness of vision Ears, nose, mouth, throat, and face: Denies mucositis or sore throat Respiratory: Denies cough, dyspnea or wheezes Cardiovascular: Denies palpitation, chest discomfort Gastrointestinal:  Denies nausea, heartburn or change in bowel habits Skin: Denies abnormal skin rashes Lymphatics: Denies new lymphadenopathy or easy bruising Neurological:Denies numbness, tingling or new weaknesses Behavioral/Psych: Mood is stable, no new changes  Extremities: No lower extremity edema Breast:  denies any pain or lumps or nodules in either breasts All other systems were reviewed with the patient and are negative.  I have reviewed the past medical history, past surgical history, social history and family history with the patient and they are unchanged from previous note.  ALLERGIES:  is allergic to penicillin g and sulfa antibiotics.  MEDICATIONS:  Current Outpatient Prescriptions  Medication Sig Dispense Refill  . ALPRAZolam (XANAX) 0.25 MG tablet Take 0.25 mg by mouth daily as needed for anxiety.    . carvedilol (COREG) 6.25 MG tablet Take 1 tablet (6.25 mg total) by mouth 2 (two) times daily with a meal. 180 tablet 3  . lidocaine-prilocaine (EMLA) cream Apply to port 1-2 hours before procedure 30 g 1  . loperamide (IMODIUM) 2 MG capsule Take 2 mg by mouth as needed for diarrhea or loose stools.    . Multiple Vitamin (MULTIVITAMIN WITH MINERALS) TABS tablet Take 1 tablet by mouth daily.    . nicotine polacrilex (COMMIT) 2 MG lozenge Take 2 mg by mouth as needed for smoking cessation.    . ondansetron (ZOFRAN) 8 MG tablet TAKE ONE TABLET BY MOUTH TWICE DAILY. START THE DAY AFTER CHEMO FOR TWO DAYS. THEN  TAKE AS NEEDED FOR NAUSEA OR FOR VOMITING  1  . prochlorperazine (COMPAZINE) 10 MG tablet TAKE ONE TABLET BY MOUTH EVERY 6 HOURS AS NEEDED FOR NAUSEA OR FOR VOMITING  1  . spironolactone  (ALDACTONE) 25 MG tablet Take 0.5 tablets (12.5 mg total) by mouth daily. 45 tablet 3   No current facility-administered medications for this visit.   Facility-Administered Medications Ordered in Other Visits  Medication Dose Route Frequency Provider Last Rate Last Dose  . sodium chloride 0.9 % injection 10 mL  10 mL Intracatheter PRN Nicholas Lose, MD   10 mL at 02/18/15 1111    PHYSICAL EXAMINATION: ECOG PERFORMANCE STATUS: 1 - Symptomatic but completely ambulatory  Filed Vitals:   03/11/15 0812  BP: 183/119  Pulse: 77  Temp: 98.7 F (37.1 C)  Resp: 18   Filed Weights   03/11/15 0812  Weight: 223 lb 14.4 oz (101.56 kg)    GENERAL:alert, no distress and comfortable SKIN: skin color, texture, turgor are normal, no rashes or significant lesions EYES: normal, Conjunctiva are pink and non-injected, sclera clear OROPHARYNX:no exudate, no erythema and lips, buccal mucosa, and tongue normal  NECK: supple, thyroid normal size, non-tender, without nodularity LYMPH:  no palpable lymphadenopathy in the cervical, axillary or inguinal LUNGS: clear to auscultation and percussion with normal breathing effort HEART: regular rate & rhythm and no murmurs and no lower extremity edema ABDOMEN:abdomen soft, non-tender and normal bowel sounds MUSCULOSKELETAL:no cyanosis of digits and no clubbing  NEURO: alert & oriented x 3 with fluent speech, no focal motor/sensory deficits EXTREMITIES: No lower extremity edema  LABORATORY DATA:  I have reviewed the data as listed   Chemistry      Component Value Date/Time   NA 140 02/18/2015 0840   NA 140 12/03/2006 1854   K 4.1 02/18/2015 0840   K 3.8 12/03/2006 1854   CL 108 12/03/2006 1854   CO2 22 02/18/2015 0840   BUN 16.9 02/18/2015 0840   BUN 21 12/03/2006 1854   CREATININE 0.8 02/18/2015 0840   CREATININE 1.0 12/03/2006 1854      Component Value Date/Time   CALCIUM 9.7 02/18/2015 0840   ALKPHOS 93 02/18/2015 0840   AST 14 02/18/2015 0840     ALT 18 02/18/2015 0840   BILITOT 0.36 02/18/2015 0840       Lab Results  Component Value Date   WBC 5.6 03/11/2015   HGB 15.5 03/11/2015   HCT 47.6* 03/11/2015   MCV 87.5 03/11/2015   PLT 215 03/11/2015   NEUTROABS 3.7 03/11/2015     ASSESSMENT & PLAN:  Breast cancer of 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 In addition to Right breast, hypermetabolic disease in Rt Supraclav LN, axilla and mediastinum, bulky disease in liver, Pulm Mets, Path fracture 9th rib   Treatment summary:Taxol weekly(started 09/10/2014 )Herceptin Perjeta every 3 weeks(started 09/03/2014) Received 10 Weeks Taxol, Herceptin Perjeta given once every 3 weeks (8/17 and 9/7 and 9/28, 10/19) ; changed chemo from Taxol to Abraxane for bronchospasm 11/19/14 and completed 12/03/14   PET CT scan 12/09/2014: Complete response in the liver, marked improvement in metastatic disease in the right breast and right axilla, improvement in bone metastases, decrease in lymphadenopathy throughout. Resolution of lung nodules.  Current Treatment: Herceptin Perjeta maintenance started 12/17/14 Chemotherapy toxicities:  1. 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 Slowly improving. Shortness of breath: Improved.  CT CAP 03/09/15: No new lung mets, stable ovoid nodule in post lateral right breast, No evidence of met disease in abd, low density lesions in liver are unchanged.  Plan: continue Herceptin Perjeta  Severe hypertension: Patient is currently on Coreg along with Aldactone. I instructed the patient to immediately get an appointment with a primary care physician to get her blood pressure under control. I would rather have Dr. Delfina Redwood manage her blood  pressure.  Return to clinic in 3 weeks for Herceptin and Perjeta and in 6 weeks for clinic follow-up. Our plan is to obtain CT scans every 3 months.  No orders of the defined types were placed in this encounter.   The patient has a good understanding of the overall plan. she agrees with it. she will call with any problems that may develop before the next visit here.   Rulon Eisenmenger, MD 03/11/2015

## 2015-03-11 NOTE — Telephone Encounter (Signed)
Scheduled next visit for 4/5 per pof. Pt will receive AVs report in infusion.

## 2015-03-11 NOTE — Progress Notes (Signed)
Unable to get in to exam room prior to MD.  No assessment performed.  

## 2015-04-01 ENCOUNTER — Encounter: Payer: Self-pay | Admitting: *Deleted

## 2015-04-01 ENCOUNTER — Other Ambulatory Visit (HOSPITAL_BASED_OUTPATIENT_CLINIC_OR_DEPARTMENT_OTHER): Payer: BLUE CROSS/BLUE SHIELD

## 2015-04-01 ENCOUNTER — Encounter: Payer: Self-pay | Admitting: Hematology and Oncology

## 2015-04-01 ENCOUNTER — Ambulatory Visit (HOSPITAL_BASED_OUTPATIENT_CLINIC_OR_DEPARTMENT_OTHER): Payer: BLUE CROSS/BLUE SHIELD | Admitting: Nurse Practitioner

## 2015-04-01 ENCOUNTER — Ambulatory Visit (HOSPITAL_BASED_OUTPATIENT_CLINIC_OR_DEPARTMENT_OTHER): Payer: BLUE CROSS/BLUE SHIELD

## 2015-04-01 VITALS — BP 129/94 | HR 79 | Temp 98.2°F

## 2015-04-01 DIAGNOSIS — C50411 Malignant neoplasm of upper-outer quadrant of right female breast: Secondary | ICD-10-CM

## 2015-04-01 DIAGNOSIS — C787 Secondary malignant neoplasm of liver and intrahepatic bile duct: Secondary | ICD-10-CM

## 2015-04-01 DIAGNOSIS — Z5112 Encounter for antineoplastic immunotherapy: Secondary | ICD-10-CM

## 2015-04-01 DIAGNOSIS — C7800 Secondary malignant neoplasm of unspecified lung: Secondary | ICD-10-CM | POA: Diagnosis not present

## 2015-04-01 DIAGNOSIS — I10 Essential (primary) hypertension: Secondary | ICD-10-CM

## 2015-04-01 LAB — CBC WITH DIFFERENTIAL/PLATELET
BASO%: 0.1 % (ref 0.0–2.0)
BASOS ABS: 0 10*3/uL (ref 0.0–0.1)
EOS%: 1.3 % (ref 0.0–7.0)
Eosinophils Absolute: 0.1 10*3/uL (ref 0.0–0.5)
HCT: 48.7 % — ABNORMAL HIGH (ref 34.8–46.6)
HGB: 15.5 g/dL (ref 11.6–15.9)
LYMPH#: 2.4 10*3/uL (ref 0.9–3.3)
LYMPH%: 33.6 % (ref 14.0–49.7)
MCH: 27.8 pg (ref 25.1–34.0)
MCHC: 31.8 g/dL (ref 31.5–36.0)
MCV: 87.3 fL (ref 79.5–101.0)
MONO#: 0.5 10*3/uL (ref 0.1–0.9)
MONO%: 7 % (ref 0.0–14.0)
NEUT#: 4.1 10*3/uL (ref 1.5–6.5)
NEUT%: 58 % (ref 38.4–76.8)
Platelets: 253 10*3/uL (ref 145–400)
RBC: 5.58 10*6/uL — ABNORMAL HIGH (ref 3.70–5.45)
RDW: 15.5 % — AB (ref 11.2–14.5)
WBC: 7.1 10*3/uL (ref 3.9–10.3)

## 2015-04-01 LAB — COMPREHENSIVE METABOLIC PANEL
ALT: 23 U/L (ref 0–55)
AST: 17 U/L (ref 5–34)
Albumin: 3.3 g/dL — ABNORMAL LOW (ref 3.5–5.0)
Alkaline Phosphatase: 99 U/L (ref 40–150)
Anion Gap: 8 mEq/L (ref 3–11)
BUN: 16.1 mg/dL (ref 7.0–26.0)
CHLORIDE: 107 meq/L (ref 98–109)
CO2: 28 meq/L (ref 22–29)
CREATININE: 0.8 mg/dL (ref 0.6–1.1)
Calcium: 9.5 mg/dL (ref 8.4–10.4)
EGFR: 79 mL/min/{1.73_m2} — ABNORMAL LOW (ref >90)
GLUCOSE: 152 mg/dL — AB (ref 70–140)
POTASSIUM: 4.1 meq/L (ref 3.5–5.1)
SODIUM: 143 meq/L (ref 136–145)
Total Bilirubin: 0.35 mg/dL (ref 0.20–1.20)
Total Protein: 7.1 g/dL (ref 6.4–8.3)

## 2015-04-01 MED ORDER — SODIUM CHLORIDE 0.9 % IJ SOLN
10.0000 mL | INTRAMUSCULAR | Status: DC | PRN
  Administered 2015-04-01: 10 mL
  Filled 2015-04-01: qty 10

## 2015-04-01 MED ORDER — SODIUM CHLORIDE 0.9 % IV SOLN
420.0000 mg | Freq: Once | INTRAVENOUS | Status: AC
  Administered 2015-04-01: 420 mg via INTRAVENOUS
  Filled 2015-04-01: qty 14

## 2015-04-01 MED ORDER — SODIUM CHLORIDE 0.9 % IV SOLN
Freq: Once | INTRAVENOUS | Status: AC
  Administered 2015-04-01: 10:00:00 via INTRAVENOUS

## 2015-04-01 MED ORDER — ACETAMINOPHEN 325 MG PO TABS
ORAL_TABLET | ORAL | Status: AC
  Filled 2015-04-01: qty 2

## 2015-04-01 MED ORDER — HEPARIN SOD (PORK) LOCK FLUSH 100 UNIT/ML IV SOLN
500.0000 [IU] | Freq: Once | INTRAVENOUS | Status: AC | PRN
  Administered 2015-04-01: 500 [IU]
  Filled 2015-04-01: qty 5

## 2015-04-01 MED ORDER — CLONIDINE HCL 0.1 MG PO TABS
ORAL_TABLET | ORAL | Status: AC
  Filled 2015-04-01: qty 1

## 2015-04-01 MED ORDER — CLONIDINE HCL 0.1 MG PO TABS
0.2000 mg | ORAL_TABLET | Freq: Once | ORAL | Status: AC
  Administered 2015-04-01: 0.2 mg via ORAL

## 2015-04-01 MED ORDER — SODIUM CHLORIDE 0.9 % IV SOLN
6.0000 mg/kg | Freq: Once | INTRAVENOUS | Status: AC
  Administered 2015-04-01: 630 mg via INTRAVENOUS
  Filled 2015-04-01: qty 30

## 2015-04-01 MED ORDER — DIPHENHYDRAMINE HCL 25 MG PO CAPS
ORAL_CAPSULE | ORAL | Status: AC
Start: 2015-04-01 — End: 2015-04-01
  Filled 2015-04-01: qty 1

## 2015-04-01 MED ORDER — DIPHENHYDRAMINE HCL 25 MG PO CAPS
25.0000 mg | ORAL_CAPSULE | Freq: Once | ORAL | Status: AC
Start: 2015-04-01 — End: 2015-04-01
  Administered 2015-04-01: 25 mg via ORAL

## 2015-04-01 MED ORDER — ACETAMINOPHEN 325 MG PO TABS
650.0000 mg | ORAL_TABLET | Freq: Once | ORAL | Status: AC
  Administered 2015-04-01: 650 mg via ORAL

## 2015-04-01 MED ORDER — CLONIDINE HCL 0.1 MG PO TABS
ORAL_TABLET | ORAL | Status: AC
  Filled 2015-04-01: qty 2

## 2015-04-01 NOTE — Progress Notes (Signed)
TC to Dr. Delfina Redwood [phone] to advise pt hypertension while in the office. Left a message for Carlyon Shadow, CMA to Dr Delfina Redwood.   [employer] notified for [employer] to help patient obtain the BP cuff.

## 2015-04-01 NOTE — Progress Notes (Signed)
Dr. Lindi Adie notified of patient's current BP. Retta Mac, NP will come to see patient before treatment. Retta Mac, NP saw patient and orders received. Advised to proceed with today's treatment as prescribed.

## 2015-04-01 NOTE — Progress Notes (Signed)
Also gave patient a referral to [facility]. Patient may return for me to fax at her next visit.

## 2015-04-01 NOTE — Progress Notes (Signed)
Patient came in for financial assistance. Approved pt for $1000 [employer] based on income. Gave patient expense form as well as copy of the award. Patient has my card for any additional financial questions or concerns.

## 2015-04-01 NOTE — Patient Instructions (Signed)
[facility] Discharge Instructions for Patients Receiving Chemotherapy  Today you received the following chemotherapy agents Herceptin and Perjeta.  To help prevent nausea and vomiting after your treatment, we encourage you to take your nausea medication as prescribed.   If you develop nausea and vomiting that is not controlled by your nausea medication, call the clinic.   BELOW ARE SYMPTOMS THAT SHOULD BE REPORTED IMMEDIATELY:  *FEVER GREATER THAN 100.5 F  *CHILLS WITH OR WITHOUT FEVER  NAUSEA AND VOMITING THAT IS NOT CONTROLLED WITH YOUR NAUSEA MEDICATION  *UNUSUAL SHORTNESS OF BREATH  *UNUSUAL BRUISING OR BLEEDING  TENDERNESS IN MOUTH AND THROAT WITH OR WITHOUT PRESENCE OF ULCERS  *URINARY PROBLEMS  *BOWEL PROBLEMS  UNUSUAL RASH Items with * indicate a potential emergency and should be followed up as soon as possible.  Feel free to call the clinic you have any questions or concerns. The clinic phone number is [phone].  Please show the CHEMO ALERT CARD at check-in to the Emergency Department and triage nurse.   

## 2015-04-03 ENCOUNTER — Encounter: Payer: Self-pay | Admitting: Nurse Practitioner

## 2015-04-03 DIAGNOSIS — I10 Essential (primary) hypertension: Secondary | ICD-10-CM | POA: Insufficient documentation

## 2015-04-03 MED ORDER — CARVEDILOL 12.5 MG PO TABS: 12.5000 mg | ORAL_TABLET | Freq: Two times a day (BID) | ORAL | Status: DC

## 2015-04-03 NOTE — Assessment & Plan Note (Signed)
Patient presented to the [facility] today to receive her next cycle of Herceptin/Perjeta antibody therapy.  Blood counts were essentially stable today.  See further notes regarding hypertension today.  Patient is scheduled to return on 04/22/2015 for labs, visit, and her next cycle of antibody infusions.

## 2015-04-03 NOTE — Progress Notes (Signed)
SYMPTOM MANAGEMENT CLINIC   HPI: Denise Morton 65 y.o. female diagnosed with breast cancer; with both lung and liver metastasis.  Currently undergoing Herceptin/Perjeta therapy.   Patient has a history of chronic hypertension.  Patient has been taking Coreg 6.25 mg twice daily and Aldactone 12.5 mg every day.  Patient states that she was prescribed the Coreg per her primary care physician Dr. Librarian, academic.  She was prescribed the Aldactone from a another physician; but can't remember if it was from a previous hospital admission or an urgent care.  Patient states that she has had some mild cold symptoms; has also been taking some over-the-counter decongestants.  Blood pressure on initial check today was 206/107.  Confirmed the patient did take all of her blood pressure medications as directed today.  Patient denies any new symptoms whatsoever.  Called Dr. Lina Sar office; and he suggested the patient increase the Coreg to 12.5 mg twice daily.  Also, patient confirmed that she R he has an appointment for follow-up with Dr. polite next week.  However, Dr. polite has requested that patient call his office and be seen in his office for follow-up this week as soon as possible.  Patient also was advised to check her blood pressure at home; the patient stated that she does not have a blood pressure cuff at home.  She states that she does not have the funds to purchase a blood pressure cuff.  [place] nurse contacted the [place]; and was able to obtain a grant so patient could obtain the blood pressure cuff.  Strongly encouraged patient to follow-up with Dr. polite as soon as possible this week.  Also, patient was advised to keep a blood pressure log to present to Dr. polite as well.  HPI  Review of Systems  All other systems reviewed and are negative.   Past Medical History  Diagnosis Date  . CRVO (central retinal vein occlusion)   . Cancer [facility])     rt breast ca    Past  Surgical History  Procedure Laterality Date  . Tonsillectomy    . Tubal ligation    . Tumor removal      benign- 2 on leg, 1 in finger    has Breast cancer of upper-outer quadrant of right female breast ([place]); Lung metastases ([place]); Metastatic cancer ([place]); Liver metastases ([place]); and Essential hypertension on her problem list.    is allergic to penicillin g and sulfa antibiotics.    Medication List       This list is accurate as of: 04/01/15 11:59 PM.  Always use your most recent med list.               ALPRAZolam 0.25 MG tablet  Commonly known as:  XANAX  Take 0.25 mg by mouth daily as needed for anxiety.     carvedilol 6.25 MG tablet  Commonly known as:  COREG  Take 1 tablet (6.25 mg total) by mouth 2 (two) times daily with a meal.     lidocaine-prilocaine cream  Commonly known as:  EMLA  Apply to port 1-2 hours before procedure     loperamide 2 MG capsule  Commonly known as:  IMODIUM  Take 2 mg by mouth as needed for diarrhea or loose stools.     multivitamin with minerals Tabs tablet  Take 1 tablet by mouth daily.     nicotine polacrilex 2 MG lozenge  Commonly known as:  COMMIT  Take 2 mg by mouth as needed  for smoking cessation.     ondansetron 8 MG tablet  Commonly known as:  ZOFRAN  TAKE ONE TABLET BY MOUTH TWICE DAILY. START THE DAY AFTER CHEMO FOR TWO DAYS. THEN TAKE AS NEEDED FOR NAUSEA OR FOR VOMITING     prochlorperazine 10 MG tablet  Commonly known as:  COMPAZINE  TAKE ONE TABLET BY MOUTH EVERY 6 HOURS AS NEEDED FOR NAUSEA OR FOR VOMITING     spironolactone 25 MG tablet  Commonly known as:  ALDACTONE  Take 0.5 tablets (12.5 mg total) by mouth daily.         PHYSICAL EXAMINATION  Oncology Vitals 04/01/2015 04/01/2015  Height - -  Weight - -  Weight (lbs) - -  BMI (kg/m2) - -  Temp - -  Pulse 79 69  Resp - -  SpO2 100 97  BSA (m2) - -   BP Readings from Last 2 Encounters:  04/01/15 129/94  03/11/15 189/99    Physical Exam    Constitutional: She is oriented to person, place, and time and well-developed, well-nourished, and in no distress.  HENT:  Head: Normocephalic and atraumatic.  Eyes: Conjunctivae and EOM are normal. Pupils are equal, round, and reactive to light. Right eye exhibits no discharge. Left eye exhibits no discharge. No scleral icterus.  Neck: Normal range of motion.  Pulmonary/Chest: Effort normal. No respiratory distress.  Musculoskeletal: Normal range of motion.  Neurological: She is alert and oriented to person, place, and time.  Psychiatric: Affect normal.  Nursing note and vitals reviewed.   LABORATORY DATA:. Appointment on 04/01/2015  Component Date Value Ref Range Status  . WBC 04/01/2015 7.1  3.9 - 10.3 10e3/uL Final  . NEUT# 04/01/2015 4.1  1.5 - 6.5 10e3/uL Final  . HGB 04/01/2015 15.5  11.6 - 15.9 g/dL Final  . HCT 04/01/2015 48.7* 34.8 - 46.6 % Final  . Platelets 04/01/2015 253  145 - 400 10e3/uL Final  . MCV 04/01/2015 87.3  79.5 - 101.0 fL Final  . MCH 04/01/2015 27.8  25.1 - 34.0 pg Final  . MCHC 04/01/2015 31.8  31.5 - 36.0 g/dL Final  . RBC 04/01/2015 5.58* 3.70 - 5.45 10e6/uL Final  . RDW 04/01/2015 15.5* 11.2 - 14.5 % Final  . lymph# 04/01/2015 2.4  0.9 - 3.3 10e3/uL Final  . MONO# 04/01/2015 0.5  0.1 - 0.9 10e3/uL Final  . Eosinophils Absolute 04/01/2015 0.1  0.0 - 0.5 10e3/uL Final  . Basophils Absolute 04/01/2015 0.0  0.0 - 0.1 10e3/uL Final  . NEUT% 04/01/2015 58.0  38.4 - 76.8 % Final  . LYMPH% 04/01/2015 33.6  14.0 - 49.7 % Final  . MONO% 04/01/2015 7.0  0.0 - 14.0 % Final  . EOS% 04/01/2015 1.3  0.0 - 7.0 % Final  . BASO% 04/01/2015 0.1  0.0 - 2.0 % Final  . Sodium 04/01/2015 143  136 - 145 mEq/L Final  . Potassium 04/01/2015 4.1  3.5 - 5.1 mEq/L Final  . Chloride 04/01/2015 107  98 - 109 mEq/L Final  . CO2 04/01/2015 28  22 - 29 mEq/L Final  . Glucose 04/01/2015 152* 70 - 140 mg/dl Final   Glucose reference range is for nonfasting patients. Fasting  glucose reference range is 70- 100.  Marland Kitchen BUN 04/01/2015 16.1  7.0 - 26.0 mg/dL Final  . Creatinine 04/01/2015 0.8  0.6 - 1.1 mg/dL Final  . Total Bilirubin 04/01/2015 0.35  0.20 - 1.20 mg/dL Final  . Alkaline Phosphatase 04/01/2015 99  40 -  150 U/L Final  . AST 04/01/2015 17  5 - 34 U/L Final  . ALT 04/01/2015 23  0 - 55 U/L Final  . Total Protein 04/01/2015 7.1  6.4 - 8.3 g/dL Final  . Albumin 04/01/2015 3.3* 3.5 - 5.0 g/dL Final  . Calcium 04/01/2015 9.5  8.4 - 10.4 mg/dL Final  . Anion Gap 04/01/2015 8  3 - 11 mEq/L Final  . EGFR 04/01/2015 79* >90 ml/min/1.73 m2 Final   eGFR is calculated using the CKD-EPI Creatinine Equation (2009)     RADIOGRAPHIC STUDIES: No results found.  ASSESSMENT/PLAN:    Essential hypertension Patient has a history of chronic hypertension.  Patient has been taking Coreg 6.25 mg twice daily and Aldactone 12.5 mg every day.  Patient states that she was prescribed the Coreg per her primary care physician Dr. Librarian, academic.  She was prescribed the Aldactone from  another physician; but can't remember if it was from a previous hospital admission or an urgent care.  Patient states that she has had some mild cold symptoms; has also been taking some over-the-counter decongestants.  Blood pressure on initial check today was 206/107.  Confirmed the patient did take all of her blood pressure medications as directed today.  Patient denies any new symptoms whatsoever.  Patient was given clonidine 0.2 mg orally; blood pressure improved to 139/82.  Called Dr. Lina Sar office; and he suggested the patient increase the Coreg to 12.5 mg twice daily.  Also, patient confirmed that she R he has an appointment for follow-up with Dr. polite next week.  However, Dr. polite has requested that patient call his office and be seen in his office for follow-up this week as soon as possible.  Patient also was advised to check her blood pressure at home; the patient stated that she does not have  a blood pressure cuff at home.  She states that she does not have the funds to purchase a blood pressure cuff.  [place] nurse contacted the [place]; and was able to obtain a grant so patient could obtain the blood pressure cuff.  Strongly encouraged patient to follow-up with Dr. polite as soon as possible this week.  Also, patient was advised to keep a blood pressure log to present to Dr. polite as well.    Breast cancer of upper-outer quadrant of right female breast Patient presented to the [facility] today to receive her next cycle of Herceptin/Perjeta antibody therapy.  Blood counts were essentially stable today.  See further notes regarding hypertension today.  Patient is scheduled to return on 04/22/2015 for labs, visit, and her next cycle of antibody infusions.   Patient stated understanding of all instructions; and was in agreement with this plan of care. The patient knows to call the clinic with any problems, questions or concerns.   Review/collaboration with Dr. Lindi Adie regarding all aspects of patient's visit today.   Total time spent with patient was 25 minutes;  with greater than 85 percent of that time spent in face to face counseling regarding patient's symptoms,  and coordination of care and follow up.  Disclaimer:This dictation was prepared with Dragon/digital dictation along with [employer]. Any transcriptional errors that result from this process are unintentional.  Drue Second, NP 04/03/2015

## 2015-04-03 NOTE — Assessment & Plan Note (Addendum)
Patient has a history of chronic hypertension.  Patient has been taking Coreg 6.25 mg twice daily and Aldactone 12.5 mg every day.  Patient states that she was prescribed the Coreg per her primary care physician Dr. Librarian, academic.  She was prescribed the Aldactone from  another physician; but can't remember if it was from a previous hospital admission or an urgent care.  Patient states that she has had some mild cold symptoms; has also been taking some over-the-counter decongestants.  Blood pressure on initial check today was 206/107.  Confirmed the patient did take all of her blood pressure medications as directed today.  Patient denies any new symptoms whatsoever.  Patient was given clonidine 0.2 mg orally; blood pressure improved to 139/82.  Called Dr. Lina Sar office; and he suggested the patient increase the Coreg to 12.5 mg twice daily.  Also, patient confirmed that she R he has an appointment for follow-up with Dr. polite next week.  However, Dr. polite has requested that patient call his office and be seen in his office for follow-up this week as soon as possible.  Patient also was advised to check her blood pressure at home; the patient stated that she does not have a blood pressure cuff at home.  She states that she does not have the funds to purchase a blood pressure cuff.  [place] nurse contacted the [place]; and was able to obtain a grant so patient could obtain the blood pressure cuff.  Strongly encouraged patient to follow-up with Dr. polite as soon as possible this week.  Also, patient was advised to keep a blood pressure log to present to Dr. polite as well.

## 2015-04-07 ENCOUNTER — Telehealth: Payer: Self-pay | Admitting: *Deleted

## 2015-04-07 DIAGNOSIS — I10 Essential (primary) hypertension: Secondary | ICD-10-CM

## 2015-04-07 NOTE — Telephone Encounter (Signed)
TC to pt to check status following [facility] visit 3/15. Pt states she has been fine. A little cold symptoms, cough runny nose that is managed with Coricidin HBP. Pt had a visit with her PCP on 3/17 and blood pressure medications were adjusted. Pt medication changes are Coreg 25mg  BID, and Losartan 50mg  QD.  Pt states Dr. Delfina Redwood states "there are too many doctors telling you different things, only listen to me. You do not need a blood pressure cuff. Do not waste money." Pt is scheduled to follow up with PCP in 1 month.  Pt complains of increase fatigue and sleepiness. Pt denies any lightheadedness or dizziness. Pt relates increase fatigue to current cold symptoms.   Pt is aware of grant money with [employer]. She is not sure how to access this. Probation officer provided Agricultural consultant.   Strongly encouraged pt to check BP since change in blood pressure medication and fatigue. Pt understands to call this office with any concerns or worsening of symptoms. Reviewed Symptom Management Clinic with her.

## 2015-04-21 ENCOUNTER — Other Ambulatory Visit: Payer: Self-pay

## 2015-04-21 DIAGNOSIS — C50411 Malignant neoplasm of upper-outer quadrant of right female breast: Secondary | ICD-10-CM

## 2015-04-21 DIAGNOSIS — C799 Secondary malignant neoplasm of unspecified site: Secondary | ICD-10-CM

## 2015-04-21 DIAGNOSIS — C787 Secondary malignant neoplasm of liver and intrahepatic bile duct: Secondary | ICD-10-CM

## 2015-04-21 DIAGNOSIS — C7802 Secondary malignant neoplasm of left lung: Secondary | ICD-10-CM

## 2015-04-22 ENCOUNTER — Telehealth: Payer: Self-pay | Admitting: Hematology and Oncology

## 2015-04-22 ENCOUNTER — Ambulatory Visit (HOSPITAL_BASED_OUTPATIENT_CLINIC_OR_DEPARTMENT_OTHER): Payer: BLUE CROSS/BLUE SHIELD | Admitting: Hematology and Oncology

## 2015-04-22 ENCOUNTER — Ambulatory Visit (HOSPITAL_BASED_OUTPATIENT_CLINIC_OR_DEPARTMENT_OTHER): Payer: BLUE CROSS/BLUE SHIELD

## 2015-04-22 ENCOUNTER — Encounter: Payer: Self-pay | Admitting: Hematology and Oncology

## 2015-04-22 ENCOUNTER — Other Ambulatory Visit (HOSPITAL_BASED_OUTPATIENT_CLINIC_OR_DEPARTMENT_OTHER): Payer: BLUE CROSS/BLUE SHIELD

## 2015-04-22 VITALS — BP 159/85 | HR 65 | Temp 98.2°F | Resp 18 | Ht 65.5 in | Wt 225.7 lb

## 2015-04-22 DIAGNOSIS — Z5112 Encounter for antineoplastic immunotherapy: Secondary | ICD-10-CM

## 2015-04-22 DIAGNOSIS — C799 Secondary malignant neoplasm of unspecified site: Secondary | ICD-10-CM

## 2015-04-22 DIAGNOSIS — C50411 Malignant neoplasm of upper-outer quadrant of right female breast: Secondary | ICD-10-CM

## 2015-04-22 DIAGNOSIS — C7800 Secondary malignant neoplasm of unspecified lung: Secondary | ICD-10-CM

## 2015-04-22 DIAGNOSIS — C787 Secondary malignant neoplasm of liver and intrahepatic bile duct: Secondary | ICD-10-CM | POA: Diagnosis not present

## 2015-04-22 DIAGNOSIS — I10 Essential (primary) hypertension: Secondary | ICD-10-CM

## 2015-04-22 DIAGNOSIS — C778 Secondary and unspecified malignant neoplasm of lymph nodes of multiple regions: Secondary | ICD-10-CM | POA: Diagnosis not present

## 2015-04-22 DIAGNOSIS — C7802 Secondary malignant neoplasm of left lung: Secondary | ICD-10-CM

## 2015-04-22 DIAGNOSIS — R197 Diarrhea, unspecified: Secondary | ICD-10-CM

## 2015-04-22 DIAGNOSIS — T451X5A Adverse effect of antineoplastic and immunosuppressive drugs, initial encounter: Principal | ICD-10-CM

## 2015-04-22 DIAGNOSIS — G620 Drug-induced polyneuropathy: Secondary | ICD-10-CM | POA: Insufficient documentation

## 2015-04-22 DIAGNOSIS — C7951 Secondary malignant neoplasm of bone: Secondary | ICD-10-CM

## 2015-04-22 LAB — COMPREHENSIVE METABOLIC PANEL
ALBUMIN: 3.2 g/dL — AB (ref 3.5–5.0)
ALK PHOS: 82 U/L (ref 40–150)
ALT: 22 U/L (ref 0–55)
AST: 16 U/L (ref 5–34)
Anion Gap: 7 mEq/L (ref 3–11)
BILIRUBIN TOTAL: 0.41 mg/dL (ref 0.20–1.20)
BUN: 15.9 mg/dL (ref 7.0–26.0)
CALCIUM: 9.2 mg/dL (ref 8.4–10.4)
CO2: 24 mEq/L (ref 22–29)
CREATININE: 0.8 mg/dL (ref 0.6–1.1)
Chloride: 111 mEq/L — ABNORMAL HIGH (ref 98–109)
EGFR: 82 mL/min/{1.73_m2} — ABNORMAL LOW (ref >90)
GLUCOSE: 118 mg/dL (ref 70–140)
POTASSIUM: 4 meq/L (ref 3.5–5.1)
SODIUM: 142 meq/L (ref 136–145)
TOTAL PROTEIN: 6.8 g/dL (ref 6.4–8.3)

## 2015-04-22 LAB — CBC WITH DIFFERENTIAL/PLATELET
BASO%: 0.7 % (ref 0.0–2.0)
BASOS ABS: 0 10*3/uL (ref 0.0–0.1)
EOS ABS: 0.2 10*3/uL (ref 0.0–0.5)
EOS%: 2.3 % (ref 0.0–7.0)
HEMATOCRIT: 46.3 % (ref 34.8–46.6)
HEMOGLOBIN: 15 g/dL (ref 11.6–15.9)
LYMPH#: 2.6 10*3/uL (ref 0.9–3.3)
LYMPH%: 38.4 % (ref 14.0–49.7)
MCH: 27.6 pg (ref 25.1–34.0)
MCHC: 32.4 g/dL (ref 31.5–36.0)
MCV: 85.1 fL (ref 79.5–101.0)
MONO#: 0.4 10*3/uL (ref 0.1–0.9)
MONO%: 6.3 % (ref 0.0–14.0)
NEUT%: 52.3 % (ref 38.4–76.8)
NEUTROS ABS: 3.5 10*3/uL (ref 1.5–6.5)
Platelets: 224 10*3/uL (ref 145–400)
RBC: 5.44 10*6/uL (ref 3.70–5.45)
RDW: 16.6 % — AB (ref 11.2–14.5)
WBC: 6.7 10*3/uL (ref 3.9–10.3)

## 2015-04-22 MED ORDER — DIPHENHYDRAMINE HCL 25 MG PO CAPS
25.0000 mg | ORAL_CAPSULE | Freq: Once | ORAL | Status: AC
  Administered 2015-04-22: 25 mg via ORAL

## 2015-04-22 MED ORDER — HEPARIN SOD (PORK) LOCK FLUSH 100 UNIT/ML IV SOLN
500.0000 [IU] | Freq: Once | INTRAVENOUS | Status: AC | PRN
  Administered 2015-04-22: 500 [IU]
  Filled 2015-04-22: qty 5

## 2015-04-22 MED ORDER — SODIUM CHLORIDE 0.9 % IV SOLN
420.0000 mg | Freq: Once | INTRAVENOUS | Status: AC
  Administered 2015-04-22: 420 mg via INTRAVENOUS
  Filled 2015-04-22: qty 14

## 2015-04-22 MED ORDER — SODIUM CHLORIDE 0.9 % IJ SOLN
10.0000 mL | INTRAMUSCULAR | Status: DC | PRN
  Administered 2015-04-22: 10 mL
  Filled 2015-04-22: qty 10

## 2015-04-22 MED ORDER — GABAPENTIN 100 MG PO CAPS: 100.0000 mg | ORAL_CAPSULE | Freq: Two times a day (BID) | ORAL | Status: DC

## 2015-04-22 MED ORDER — TRASTUZUMAB CHEMO INJECTION 440 MG
6.0000 mg/kg | Freq: Once | INTRAVENOUS | Status: AC
  Administered 2015-04-22: 630 mg via INTRAVENOUS
  Filled 2015-04-22: qty 30

## 2015-04-22 MED ORDER — SODIUM CHLORIDE 0.9 % IV SOLN
Freq: Once | INTRAVENOUS | Status: AC
  Administered 2015-04-22: 09:00:00 via INTRAVENOUS

## 2015-04-22 MED ORDER — ACETAMINOPHEN 325 MG PO TABS
ORAL_TABLET | ORAL | Status: AC
  Filled 2015-04-22: qty 2

## 2015-04-22 MED ORDER — DIPHENHYDRAMINE HCL 25 MG PO CAPS
ORAL_CAPSULE | ORAL | Status: AC
  Filled 2015-04-22: qty 1

## 2015-04-22 MED ORDER — ACETAMINOPHEN 325 MG PO TABS
650.0000 mg | ORAL_TABLET | Freq: Once | ORAL | Status: AC
  Administered 2015-04-22: 650 mg via ORAL

## 2015-04-22 NOTE — Telephone Encounter (Signed)
appt made and avs will print in treatment room °

## 2015-04-22 NOTE — Assessment & Plan Note (Signed)
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 In addition to Right breast, hypermetabolic disease in Rt [address]rac[address] LN, axilla and mediastinum, bulky disease in liver, Pulm Mets, Path fracture 9th rib   Treatment summary:Taxol weekly(started 09/10/2014 )Herceptin Perjeta every 3 weeks(started 09/03/2014) Received 10 Weeks Taxol, Herceptin Perjeta given once every 3 weeks (8/17 and 9/7 and 9/28, 10/19) ; changed chemo from Taxol to Abraxane for bronchospasm 11/19/14 and completed 12/03/14   PET CT scan 12/09/2014: Complete response in the liver, marked improvement in metastatic disease in the right breast and right axilla, improvement in bone metastases, decrease in lymphadenopathy throughout. Resolution of lung nodules.  Current Treatment: Herceptin Perjeta maintenance started 12/17/14 Chemotherapy toxicities: 1. 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 Started the patient on Neurontin 100 mg by mouth twice a day  CT CAP 03/09/15: No new lung mets, stable ovoid nodule in post lateral right breast, No evidence of met disease in abd, low density lesions in liver are unchanged.  Plan:  1. continue Herceptin Perjeta 2. Obtain CT chest abdomen pelvis and follow-up in 6 weeks  Severe hypertension: Being managed by Dr. Delfina Redwood.  Return to clinic in 3 weeks for Herceptin and Perjeta and in 6 weeks for clinic follow-up and to review scans.

## 2015-04-22 NOTE — Progress Notes (Signed)
Patient Care Team: Seward Carol, MD as PCP - General (Internal Medicine)  SUMMARY OF ONCOLOGIC HISTORY:   Breast cancer of upper-outer quadrant of right female breast ([place])   08/15/2014 Initial Diagnosis Right Breast Inflammatory invasive ductal cancer: 17 cm by ultrasound along with 13 cm ulceration, node also positive T4N1 (stage 3C) ER 0%, PR 0%, Her-2 Pending   08/22/2014 PET scan Metastatic disease. In addition to Right breast, hypermetabolic disease in Rt [address]raclav LN, axilla and mediastinum, bulky disease in liver, Pulm Mets, Path fracture 9th rib   09/03/2014 - 11/26/2014 Chemotherapy Herceptin Perjeta started 09/03/2014, Taxol weekly added 09/10/2014   10/17/2014 Imaging CT scans: Decrease in right axillary lymph node, decreased skin thickening of the right breast, decreased in bilateral pulmonary nodules, decrease in liver metastases   12/09/2014 PET scan Remarkable response to chemotherapy resolution of liver metastases, marked improvement in the tumor in the breast, marked improvement in right axillary lymph nodes, resolution of lung nodules, improvement in left sixth rib   12/17/2014 -  Chemotherapy Herceptin and Perjeta maintenance every 3 weeks    CHIEF COMPLIANT: Follow-up on Herceptin and Perjeta  INTERVAL HISTORY: Denise Morton is a 65 year old with above-mentioned history of metastatic breast cancer currently on Herceptin and Perjeta maintenance every 3 weeks. She appears to be tolerating the treatment fairly well. She had diarrhea which is well controlled with Imodium. Recently her diabetic medications were changed to metformin and because of that she had more diarrhea. She complains of the neuropathy is persistent. It is not improving anymore.  REVIEW OF SYSTEMS:   Constitutional: Denies fevers, chills or abnormal weight loss Eyes: Denies blurriness of vision Ears, nose, mouth, throat, and face: Denies mucositis or sore throat Respiratory: Denies cough, dyspnea or  wheezes Cardiovascular: Denies palpitation, chest discomfort Gastrointestinal:  Denies nausea, heartburn or change in bowel habits Skin: Denies abnormal skin rashes Lymphatics: Denies new lymphadenopathy or easy bruising Neurological: Peripheral neuropathy Behavioral/Psych: Mood is stable, no new changes  Extremities: No lower extremity edema  All other systems were reviewed with the patient and are negative.  I have reviewed the past medical history, past surgical history, social history and family history with the patient and they are unchanged from previous note.  ALLERGIES:  is allergic to penicillin g and sulfa antibiotics.  MEDICATIONS:  Current Outpatient Prescriptions  Medication Sig Dispense Refill  . ALPRAZolam (XANAX) 0.25 MG tablet Take 0.25 mg by mouth daily as needed for anxiety.    . carvedilol (COREG) 12.5 MG tablet Take 1 tablet (12.5 mg total) by mouth 2 (two) times daily with a meal. 60 tablet 0  . gabapentin (NEURONTIN) 100 MG capsule Take 1 capsule (100 mg total) by mouth 2 (two) times daily. 60 capsule 3  . lidocaine-prilocaine (EMLA) cream Apply to port 1-2 hours before procedure 30 g 1  . loperamide (IMODIUM) 2 MG capsule Take 2 mg by mouth as needed for diarrhea or loose stools.    . Multiple Vitamin (MULTIVITAMIN WITH MINERALS) TABS tablet Take 1 tablet by mouth daily.    . nicotine polacrilex (COMMIT) 2 MG lozenge Take 2 mg by mouth as needed for smoking cessation.    . ondansetron (ZOFRAN) 8 MG tablet TAKE ONE TABLET BY MOUTH TWICE DAILY. START THE DAY AFTER CHEMO FOR TWO DAYS. THEN TAKE AS NEEDED FOR NAUSEA OR FOR VOMITING  1  . prochlorperazine (COMPAZINE) 10 MG tablet TAKE ONE TABLET BY MOUTH EVERY 6 HOURS AS NEEDED FOR NAUSEA OR FOR VOMITING  1   No current facility-administered medications for this visit.   Facility-Administered Medications Ordered in Other Visits  Medication Dose Route Frequency Provider Last Rate Last Dose  . sodium chloride 0.9 %  injection 10 mL  10 mL Intracatheter PRN Nicholas Lose, MD   10 mL at 02/18/15 1111    PHYSICAL EXAMINATION: ECOG PERFORMANCE STATUS: 1 - Symptomatic but completely ambulatory  Filed Vitals:   04/22/15 0804  BP: 159/85  Pulse: 65  Temp: 98.2 F (36.8 C)  Resp: 18   Filed Weights   04/22/15 0804  Weight: 225 lb 11.2 oz (102.377 kg)    GENERAL:alert, no distress and comfortable SKIN: skin color, texture, turgor are normal, no rashes or significant lesions EYES: normal, Conjunctiva are pink and non-injected, sclera clear OROPHARYNX:no exudate, no erythema and lips, buccal mucosa, and tongue normal  NECK: supple, thyroid normal size, non-tender, without nodularity LYMPH:  no palpable lymphadenopathy in the cervical, axillary or inguinal LUNGS: clear to auscultation and percussion with normal breathing effort HEART: regular rate & rhythm and no murmurs and no lower extremity edema ABDOMEN:abdomen soft, non-tender and normal bowel sounds MUSCULOSKELETAL:no cyanosis of digits and no clubbing  NEURO: alert & oriented x 3 with fluent speech, no focal motor/sensory deficits EXTREMITIES: No lower extremity edema  LABORATORY DATA:  I have reviewed the data as listed   Chemistry      Component Value Date/Time   NA 143 04/01/2015 0901   NA 140 12/03/2006 1854   K 4.1 04/01/2015 0901   K 3.8 12/03/2006 1854   CL 108 12/03/2006 1854   CO2 28 04/01/2015 0901   BUN 16.1 04/01/2015 0901   BUN 21 12/03/2006 1854   CREATININE 0.8 04/01/2015 0901   CREATININE 1.0 12/03/2006 1854      Component Value Date/Time   CALCIUM 9.5 04/01/2015 0901   ALKPHOS 99 04/01/2015 0901   AST 17 04/01/2015 0901   ALT 23 04/01/2015 0901   BILITOT 0.35 04/01/2015 0901       Lab Results  Component Value Date   WBC 6.7 04/22/2015   HGB 15.0 04/22/2015   HCT 46.3 04/22/2015   MCV 85.1 04/22/2015   PLT 224 04/22/2015   NEUTROABS 3.5 04/22/2015     ASSESSMENT & PLAN:  Breast cancer of upper-outer  quadrant of right female breast 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 In addition to Right breast, hypermetabolic disease in Rt [address]raclav LN, axilla and mediastinum, bulky disease in liver, Pulm Mets, Path fracture 9th rib   Treatment summary:Taxol weekly(started 09/10/2014 )Herceptin Perjeta every 3 weeks(started 09/03/2014) Received 10 Weeks Taxol, Herceptin Perjeta given once every 3 weeks (8/17 and 9/7 and 9/28, 10/19) ; changed chemo from Taxol to Abraxane for bronchospasm 11/19/14 and completed 12/03/14   PET CT scan 12/09/2014: Complete response in the liver, marked improvement in metastatic disease in the right breast and right axilla, improvement in bone metastases, decrease in lymphadenopathy throughout. Resolution of lung nodules.  Current Treatment: Herceptin Perjeta maintenance started 12/17/14 Chemotherapy toxicities: 1. 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 Started the patient on Neurontin 100 mg by mouth twice a day  CT CAP 03/09/15: No new lung mets, stable ovoid nodule in post lateral right breast, No evidence of met disease in abd, low density lesions in liver are  unchanged.  Plan:  1. continue Herceptin Perjeta 2. Obtain CT chest abdomen pelvis and follow-up in 6 weeks  Severe hypertension: Being managed by Dr. Delfina Redwood.  Return to clinic in 3 weeks for Herceptin and Perjeta and in 6 weeks for clinic follow-up and to review scans.    Orders Placed This Encounter  Procedures  . CT Abdomen Pelvis W Contrast    Standing Status: Future     Number of Occurrences:      Standing Expiration Date: 04/21/2016    Order Specific Question:  If indicated for the ordered procedure, I authorize the administration of contrast media per Radiology  protocol    Answer:  Yes    Order Specific Question:  Reason for Exam (SYMPTOM  OR DIAGNOSIS REQUIRED)    Answer:  Metastatic Breast cancer    Order Specific Question:  Preferred imaging location?    Answer:  [facility]  . CT Chest W Contrast    Standing Status: Future     Number of Occurrences:      Standing Expiration Date: 04/21/2016    Order Specific Question:  If indicated for the ordered procedure, I authorize the administration of contrast media per Radiology protocol    Answer:  Yes    Order Specific Question:  Reason for Exam (SYMPTOM  OR DIAGNOSIS REQUIRED)    Answer:  Metastatic breast cancer    Order Specific Question:  Preferred imaging location?    Answer:  [facility]   The patient has a good understanding of the overall plan. she agrees with it. she will call with any problems that may develop before the next visit here.   Rulon Eisenmenger, MD 04/22/2015

## 2015-04-22 NOTE — Progress Notes (Signed)
Unable to get in to exam room prior to MD.  No assessment performed.  

## 2015-04-22 NOTE — Patient Instructions (Signed)
[facility] Discharge Instructions for Patients  Today you received the following: Herceptin and Perjeta   To help prevent nausea and vomiting after your treatment, we encourage you to take your nausea medication as directed   If you develop nausea and vomiting that is not controlled by your nausea medication, call the clinic.   BELOW ARE SYMPTOMS THAT SHOULD BE REPORTED IMMEDIATELY:  *FEVER GREATER THAN 100.5 F  *CHILLS WITH OR WITHOUT FEVER  NAUSEA AND VOMITING THAT IS NOT CONTROLLED WITH YOUR NAUSEA MEDICATION  *UNUSUAL SHORTNESS OF BREATH  *UNUSUAL BRUISING OR BLEEDING  TENDERNESS IN MOUTH AND THROAT WITH OR WITHOUT PRESENCE OF ULCERS  *URINARY PROBLEMS  *BOWEL PROBLEMS  UNUSUAL RASH Items with * indicate a potential emergency and should be followed up as soon as possible.  Feel free to call the clinic you have any questions or concerns. The clinic phone number is [phone].  Please show the CHEMO ALERT CARD at check-in to the Emergency Department and triage nurse.   

## 2015-05-12 ENCOUNTER — Other Ambulatory Visit: Payer: Self-pay

## 2015-05-12 ENCOUNTER — Encounter: Payer: BLUE CROSS/BLUE SHIELD | Attending: Internal Medicine

## 2015-05-12 VITALS — Ht 65.0 in

## 2015-05-12 DIAGNOSIS — E139 Other specified diabetes mellitus without complications: Secondary | ICD-10-CM | POA: Insufficient documentation

## 2015-05-12 DIAGNOSIS — E119 Type 2 diabetes mellitus without complications: Secondary | ICD-10-CM

## 2015-05-12 DIAGNOSIS — C50411 Malignant neoplasm of upper-outer quadrant of right female breast: Secondary | ICD-10-CM

## 2015-05-12 NOTE — Progress Notes (Signed)
Patient was seen on 05/12/2015 for the first of a series of three diabetes self-management courses at [facility].  Patient Education Plan per assessed needs and concerns is to attend three course education program for Diabetes Self Management Education.  The following learning objectives were met by the patient during this class:  Describe diabetes  State some common risk factors for diabetes  Defines the role of glucose and insulin  Identifies type of diabetes and pathophysiology  Describe the relationship between diabetes and cardiovascular risk  State the members of the Healthcare Team  States the rationale for glucose monitoring  State when to test glucose  State their individual Target Range  State the importance of logging glucose readings  Describe how to interpret glucose readings  Identifies A1C target  Explain the correlation between A1c and eAG values  State symptoms and treatment of high blood glucose  State symptoms and treatment of low blood glucose  Explain proper technique for glucose testing  Identifies proper sharps disposal  Handouts given during class include:  Living Well with Diabetes book  Carb Counting and Meal Planning book  Meal Plan Card  Carbohydrate guide  Meal planning worksheet  Low Sodium Flavoring Tips  The diabetes portion plate  O7S to eAG Conversion Chart  Diabetes Medications  Diabetes Recommended Care Schedule  Support Group  Diabetes Success Plan  Core Class Satisfaction Survey  Follow-Up Plan:  Attend core 2

## 2015-05-13 ENCOUNTER — Ambulatory Visit (HOSPITAL_BASED_OUTPATIENT_CLINIC_OR_DEPARTMENT_OTHER): Payer: BLUE CROSS/BLUE SHIELD

## 2015-05-13 ENCOUNTER — Other Ambulatory Visit (HOSPITAL_BASED_OUTPATIENT_CLINIC_OR_DEPARTMENT_OTHER): Payer: BLUE CROSS/BLUE SHIELD

## 2015-05-13 VITALS — BP 99/75 | HR 56 | Temp 97.6°F | Resp 17

## 2015-05-13 DIAGNOSIS — C50411 Malignant neoplasm of upper-outer quadrant of right female breast: Secondary | ICD-10-CM

## 2015-05-13 DIAGNOSIS — Z5112 Encounter for antineoplastic immunotherapy: Secondary | ICD-10-CM | POA: Diagnosis not present

## 2015-05-13 DIAGNOSIS — C787 Secondary malignant neoplasm of liver and intrahepatic bile duct: Secondary | ICD-10-CM

## 2015-05-13 DIAGNOSIS — C7800 Secondary malignant neoplasm of unspecified lung: Secondary | ICD-10-CM | POA: Diagnosis not present

## 2015-05-13 LAB — CBC WITH DIFFERENTIAL/PLATELET
BASO%: 0.8 % (ref 0.0–2.0)
BASOS ABS: 0.1 10*3/uL (ref 0.0–0.1)
EOS ABS: 0.1 10*3/uL (ref 0.0–0.5)
EOS%: 1.2 % (ref 0.0–7.0)
HCT: 45.9 % (ref 34.8–46.6)
HEMOGLOBIN: 14.6 g/dL (ref 11.6–15.9)
LYMPH%: 29 % (ref 14.0–49.7)
MCH: 27.9 pg (ref 25.1–34.0)
MCHC: 31.9 g/dL (ref 31.5–36.0)
MCV: 87.4 fL (ref 79.5–101.0)
MONO#: 0.4 10*3/uL (ref 0.1–0.9)
MONO%: 6.1 % (ref 0.0–14.0)
NEUT#: 4.1 10*3/uL (ref 1.5–6.5)
NEUT%: 62.9 % (ref 38.4–76.8)
PLATELETS: 197 10*3/uL (ref 145–400)
RBC: 5.25 10*6/uL (ref 3.70–5.45)
RDW: 17.3 % — ABNORMAL HIGH (ref 11.2–14.5)
WBC: 6.5 10*3/uL (ref 3.9–10.3)
lymph#: 1.9 10*3/uL (ref 0.9–3.3)

## 2015-05-13 LAB — COMPREHENSIVE METABOLIC PANEL
ALT: 14 U/L (ref 0–55)
AST: 12 U/L (ref 5–34)
Albumin: 3.2 g/dL — ABNORMAL LOW (ref 3.5–5.0)
Alkaline Phosphatase: 75 U/L (ref 40–150)
Anion Gap: 9 mEq/L (ref 3–11)
BILIRUBIN TOTAL: 0.36 mg/dL (ref 0.20–1.20)
BUN: 26 mg/dL (ref 7.0–26.0)
CALCIUM: 9.2 mg/dL (ref 8.4–10.4)
CHLORIDE: 111 meq/L — AB (ref 98–109)
CO2: 21 meq/L — AB (ref 22–29)
CREATININE: 1 mg/dL (ref 0.6–1.1)
EGFR: 59 mL/min/{1.73_m2} — ABNORMAL LOW (ref >90)
Glucose: 198 mg/dl — ABNORMAL HIGH (ref 70–140)
Potassium: 4.2 mEq/L (ref 3.5–5.1)
Sodium: 141 mEq/L (ref 136–145)
TOTAL PROTEIN: 6.4 g/dL (ref 6.4–8.3)

## 2015-05-13 MED ORDER — ACETAMINOPHEN 325 MG PO TABS
650.0000 mg | ORAL_TABLET | Freq: Once | ORAL | Status: AC
  Administered 2015-05-13: 650 mg via ORAL

## 2015-05-13 MED ORDER — HEPARIN SOD (PORK) LOCK FLUSH 100 UNIT/ML IV SOLN
500.0000 [IU] | Freq: Once | INTRAVENOUS | Status: AC | PRN
  Administered 2015-05-13: 500 [IU]
  Filled 2015-05-13: qty 5

## 2015-05-13 MED ORDER — SODIUM CHLORIDE 0.9 % IV SOLN
Freq: Once | INTRAVENOUS | Status: AC
  Administered 2015-05-13: 10:00:00 via INTRAVENOUS

## 2015-05-13 MED ORDER — ACETAMINOPHEN 325 MG PO TABS
ORAL_TABLET | ORAL | Status: AC
  Filled 2015-05-13: qty 2

## 2015-05-13 MED ORDER — DIPHENHYDRAMINE HCL 25 MG PO CAPS
25.0000 mg | ORAL_CAPSULE | Freq: Once | ORAL | Status: AC
  Administered 2015-05-13: 25 mg via ORAL

## 2015-05-13 MED ORDER — TRASTUZUMAB CHEMO INJECTION 440 MG
6.0000 mg/kg | Freq: Once | INTRAVENOUS | Status: AC
  Administered 2015-05-13: 630 mg via INTRAVENOUS
  Filled 2015-05-13: qty 30

## 2015-05-13 MED ORDER — SODIUM CHLORIDE 0.9 % IJ SOLN
10.0000 mL | INTRAMUSCULAR | Status: DC | PRN
  Administered 2015-05-13: 10 mL
  Filled 2015-05-13: qty 10

## 2015-05-13 MED ORDER — DIPHENHYDRAMINE HCL 25 MG PO CAPS
ORAL_CAPSULE | ORAL | Status: AC
  Filled 2015-05-13: qty 1

## 2015-05-13 MED ORDER — SODIUM CHLORIDE 0.9 % IV SOLN
420.0000 mg | Freq: Once | INTRAVENOUS | Status: AC
  Administered 2015-05-13: 420 mg via INTRAVENOUS
  Filled 2015-05-13: qty 14

## 2015-05-13 NOTE — Patient Instructions (Signed)
[facility] Discharge Instructions for Patients Receiving Chemotherapy  Today you received the following chemotherapy agents:  Herceptin, Perjeta  To help prevent nausea and vomiting after your treatment, we encourage you to take your nausea medication as prescribed.   If you develop nausea and vomiting that is not controlled by your nausea medication, call the clinic.   BELOW ARE SYMPTOMS THAT SHOULD BE REPORTED IMMEDIATELY:  *FEVER GREATER THAN 100.5 F  *CHILLS WITH OR WITHOUT FEVER  NAUSEA AND VOMITING THAT IS NOT CONTROLLED WITH YOUR NAUSEA MEDICATION  *UNUSUAL SHORTNESS OF BREATH  *UNUSUAL BRUISING OR BLEEDING  TENDERNESS IN MOUTH AND THROAT WITH OR WITHOUT PRESENCE OF ULCERS  *URINARY PROBLEMS  *BOWEL PROBLEMS  UNUSUAL RASH Items with * indicate a potential emergency and should be followed up as soon as possible.  Feel free to call the clinic you have any questions or concerns. The clinic phone number is [phone].  Please show the CHEMO ALERT CARD at check-in to the Emergency Department and triage nurse.   

## 2015-05-19 ENCOUNTER — Encounter: Payer: PPO | Attending: Internal Medicine | Admitting: *Deleted

## 2015-05-19 DIAGNOSIS — E139 Other specified diabetes mellitus without complications: Secondary | ICD-10-CM | POA: Insufficient documentation

## 2015-05-19 DIAGNOSIS — E119 Type 2 diabetes mellitus without complications: Secondary | ICD-10-CM

## 2015-05-21 ENCOUNTER — Encounter: Payer: Self-pay | Admitting: *Deleted

## 2015-05-21 NOTE — Progress Notes (Signed)

## 2015-05-26 ENCOUNTER — Encounter: Payer: Self-pay | Admitting: *Deleted

## 2015-05-26 ENCOUNTER — Encounter: Payer: PPO | Admitting: *Deleted

## 2015-05-26 DIAGNOSIS — E139 Other specified diabetes mellitus without complications: Secondary | ICD-10-CM | POA: Diagnosis not present

## 2015-05-26 DIAGNOSIS — E119 Type 2 diabetes mellitus without complications: Secondary | ICD-10-CM

## 2015-05-26 NOTE — Progress Notes (Signed)
Patient was seen on 05/26/15 for the third of a series of three diabetes self-management courses at [facility].   Denise Morton the amount of activity recommended for healthy living . Describe activities suitable for individual needs . Identify ways to regularly incorporate activity into daily life . Identify barriers to activity and ways to over come these barriers  Identify diabetes medications being personally used and their primary action for lowering glucose and possible side effects . Describe role of stress on blood glucose and develop strategies to address psychosocial issues . Identify diabetes complications and ways to prevent them  Explain how to manage diabetes during illness . Evaluate success in meeting personal goal . Establish 2-3 goals that they will plan to diligently work on until they return for the  71-month follow-up visit  Goals:   I will count my carb choices at most meals and snacks  I will be active 30 minutes or more 5 times a week  I will take my diabetes medications as scheduled  I will eat less unhealthy fats by eating less fast food  Your patient has identified these potential barriers to change:  Motivation  Your patient has identified their diabetes self-care support plan as  [facility] Support Group On-line Resources Plan:  Attend Support Group as desired

## 2015-06-01 ENCOUNTER — Encounter (HOSPITAL_COMMUNITY): Payer: Self-pay

## 2015-06-01 ENCOUNTER — Ambulatory Visit (HOSPITAL_COMMUNITY)
Admission: RE | Admit: 2015-06-01 | Discharge: 2015-06-01 | Disposition: A | Discharge status: Home / Self Care | Payer: PPO | Source: Ambulatory Visit | Attending: Hematology and Oncology | Admitting: Hematology and Oncology

## 2015-06-01 DIAGNOSIS — N200 Calculus of kidney: Secondary | ICD-10-CM | POA: Diagnosis not present

## 2015-06-01 DIAGNOSIS — C50411 Malignant neoplasm of upper-outer quadrant of right female breast: Secondary | ICD-10-CM | POA: Diagnosis not present

## 2015-06-01 DIAGNOSIS — I2510 Atherosclerotic heart disease of native coronary artery without angina pectoris: Secondary | ICD-10-CM | POA: Diagnosis not present

## 2015-06-01 DIAGNOSIS — C50911 Malignant neoplasm of unspecified site of right female breast: Secondary | ICD-10-CM | POA: Diagnosis not present

## 2015-06-01 MED ORDER — IOPAMIDOL (ISOVUE-300) INJECTION 61%
100.0000 mL | Freq: Once | INTRAVENOUS | Status: AC | PRN
  Administered 2015-06-01: 100 mL via INTRAVENOUS

## 2015-06-03 ENCOUNTER — Ambulatory Visit (HOSPITAL_BASED_OUTPATIENT_CLINIC_OR_DEPARTMENT_OTHER): Payer: PPO | Admitting: Hematology and Oncology

## 2015-06-03 ENCOUNTER — Other Ambulatory Visit (HOSPITAL_BASED_OUTPATIENT_CLINIC_OR_DEPARTMENT_OTHER): Payer: PPO

## 2015-06-03 ENCOUNTER — Ambulatory Visit (HOSPITAL_BASED_OUTPATIENT_CLINIC_OR_DEPARTMENT_OTHER): Payer: PPO

## 2015-06-03 ENCOUNTER — Encounter: Payer: Self-pay | Admitting: Hematology and Oncology

## 2015-06-03 VITALS — BP 121/88 | HR 66 | Temp 98.2°F | Resp 18 | Wt 224.5 lb

## 2015-06-03 DIAGNOSIS — C787 Secondary malignant neoplasm of liver and intrahepatic bile duct: Secondary | ICD-10-CM | POA: Diagnosis not present

## 2015-06-03 DIAGNOSIS — C50411 Malignant neoplasm of upper-outer quadrant of right female breast: Secondary | ICD-10-CM

## 2015-06-03 DIAGNOSIS — Z5112 Encounter for antineoplastic immunotherapy: Secondary | ICD-10-CM | POA: Diagnosis not present

## 2015-06-03 DIAGNOSIS — C778 Secondary and unspecified malignant neoplasm of lymph nodes of multiple regions: Secondary | ICD-10-CM

## 2015-06-03 DIAGNOSIS — G620 Drug-induced polyneuropathy: Secondary | ICD-10-CM

## 2015-06-03 DIAGNOSIS — C7800 Secondary malignant neoplasm of unspecified lung: Secondary | ICD-10-CM | POA: Diagnosis not present

## 2015-06-03 DIAGNOSIS — C7951 Secondary malignant neoplasm of bone: Secondary | ICD-10-CM

## 2015-06-03 DIAGNOSIS — I10 Essential (primary) hypertension: Secondary | ICD-10-CM

## 2015-06-03 DIAGNOSIS — Z171 Estrogen receptor negative status [ER-]: Secondary | ICD-10-CM

## 2015-06-03 LAB — COMPREHENSIVE METABOLIC PANEL
ALBUMIN: 3.3 g/dL — AB (ref 3.5–5.0)
ALK PHOS: 83 U/L (ref 40–150)
ALT: 16 U/L (ref 0–55)
AST: 12 U/L (ref 5–34)
Anion Gap: 6 mEq/L (ref 3–11)
BILIRUBIN TOTAL: 0.57 mg/dL (ref 0.20–1.20)
BUN: 21.9 mg/dL (ref 7.0–26.0)
CALCIUM: 9.6 mg/dL (ref 8.4–10.4)
CO2: 24 mEq/L (ref 22–29)
Chloride: 110 mEq/L — ABNORMAL HIGH (ref 98–109)
Creatinine: 0.8 mg/dL (ref 0.6–1.1)
EGFR: 80 mL/min/{1.73_m2} — AB (ref >90)
Glucose: 151 mg/dl — ABNORMAL HIGH (ref 70–140)
POTASSIUM: 4.2 meq/L (ref 3.5–5.1)
Sodium: 140 mEq/L (ref 136–145)
Total Protein: 6.9 g/dL (ref 6.4–8.3)

## 2015-06-03 LAB — CBC WITH DIFFERENTIAL/PLATELET
BASO%: 0.6 % (ref 0.0–2.0)
BASOS ABS: 0 10*3/uL (ref 0.0–0.1)
EOS ABS: 0.1 10*3/uL (ref 0.0–0.5)
EOS%: 1.7 % (ref 0.0–7.0)
HEMATOCRIT: 46.9 % — AB (ref 34.8–46.6)
HEMOGLOBIN: 15.2 g/dL (ref 11.6–15.9)
LYMPH#: 2 10*3/uL (ref 0.9–3.3)
LYMPH%: 33.6 % (ref 14.0–49.7)
MCH: 28.2 pg (ref 25.1–34.0)
MCHC: 32.3 g/dL (ref 31.5–36.0)
MCV: 87.3 fL (ref 79.5–101.0)
MONO#: 0.4 10*3/uL (ref 0.1–0.9)
MONO%: 6.8 % (ref 0.0–14.0)
NEUT#: 3.5 10*3/uL (ref 1.5–6.5)
NEUT%: 57.3 % (ref 38.4–76.8)
Platelets: 200 10*3/uL (ref 145–400)
RBC: 5.37 10*6/uL (ref 3.70–5.45)
RDW: 17 % — AB (ref 11.2–14.5)
WBC: 6.1 10*3/uL (ref 3.9–10.3)

## 2015-06-03 MED ORDER — SODIUM CHLORIDE 0.9 % IJ SOLN
10.0000 mL | INTRAMUSCULAR | Status: DC | PRN
  Administered 2015-06-03: 10 mL
  Filled 2015-06-03: qty 10

## 2015-06-03 MED ORDER — DIPHENHYDRAMINE HCL 25 MG PO CAPS
25.0000 mg | ORAL_CAPSULE | Freq: Once | ORAL | Status: AC
  Administered 2015-06-03: 25 mg via ORAL

## 2015-06-03 MED ORDER — SODIUM CHLORIDE 0.9 % IV SOLN
420.0000 mg | Freq: Once | INTRAVENOUS | Status: AC
  Administered 2015-06-03: 420 mg via INTRAVENOUS
  Filled 2015-06-03: qty 14

## 2015-06-03 MED ORDER — DIPHENHYDRAMINE HCL 25 MG PO CAPS
ORAL_CAPSULE | ORAL | Status: AC
  Filled 2015-06-03: qty 1

## 2015-06-03 MED ORDER — SODIUM CHLORIDE 0.9 % IV SOLN
Freq: Once | INTRAVENOUS | Status: AC
  Administered 2015-06-03: 09:00:00 via INTRAVENOUS

## 2015-06-03 MED ORDER — TRASTUZUMAB CHEMO INJECTION 440 MG
6.0000 mg/kg | Freq: Once | INTRAVENOUS | Status: AC
  Administered 2015-06-03: 630 mg via INTRAVENOUS
  Filled 2015-06-03: qty 30

## 2015-06-03 MED ORDER — ACETAMINOPHEN 325 MG PO TABS
ORAL_TABLET | ORAL | Status: AC
  Filled 2015-06-03: qty 2

## 2015-06-03 MED ORDER — HEPARIN SOD (PORK) LOCK FLUSH 100 UNIT/ML IV SOLN
500.0000 [IU] | Freq: Once | INTRAVENOUS | Status: AC | PRN
  Administered 2015-06-03: 500 [IU]
  Filled 2015-06-03: qty 5

## 2015-06-03 MED ORDER — ACETAMINOPHEN 325 MG PO TABS
650.0000 mg | ORAL_TABLET | Freq: Once | ORAL | Status: AC
  Administered 2015-06-03: 650 mg via ORAL

## 2015-06-03 NOTE — Assessment & Plan Note (Signed)
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 In addition to Right breast, hypermetabolic disease in Rt [address]raclav LN, axilla and mediastinum, bulky disease in liver, Pulm Mets, Path fracture 9th rib   Treatment summary:Taxol weekly(started 09/10/2014 )Herceptin Perjeta every 3 weeks(started 09/03/2014) Received 10 Weeks Taxol, Herceptin Perjeta given once every 3 weeks (8/17 and 9/7 and 9/28, 10/19) ; changed chemo from Taxol to Abraxane for bronchospasm 11/19/14 and completed 12/03/14   PET CT scan 12/09/2014: Complete response in the liver, marked improvement in metastatic disease in the right breast and right axilla, improvement in bone metastases, decrease in lymphadenopathy throughout. Resolution of lung nodules. CT CAP 03/09/15: No new lung mets, stable ovoid nodule in post lateral right breast, No evidence of met disease in abd, low density lesions in liver are unchanged. CT CAP 06/01/2015:no evidence of metastatic disease in the liver with continued complete response to treatment.  Current Treatment: Herceptin Perjeta maintenance started 12/17/14 Chemotherapy toxicities: 1. 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 Started the patient on Neurontin 100 mg by mouth twice a day  Plan:  1. continue Herceptin Perjeta  Severe hypertension: Being managed by Dr. Delfina Redwood.  Return to clinic in 3 weeks for Herceptin and Perjeta and in 6 weeks for clinic follow-up

## 2015-06-03 NOTE — Patient Instructions (Signed)
[facility] Discharge Instructions for Patients  Today you received the following: Herceptin and Perjeta   To help prevent nausea and vomiting after your treatment, we encourage you to take your nausea medication as directed   If you develop nausea and vomiting that is not controlled by your nausea medication, call the clinic.   BELOW ARE SYMPTOMS THAT SHOULD BE REPORTED IMMEDIATELY:  *FEVER GREATER THAN 100.5 F  *CHILLS WITH OR WITHOUT FEVER  NAUSEA AND VOMITING THAT IS NOT CONTROLLED WITH YOUR NAUSEA MEDICATION  *UNUSUAL SHORTNESS OF BREATH  *UNUSUAL BRUISING OR BLEEDING  TENDERNESS IN MOUTH AND THROAT WITH OR WITHOUT PRESENCE OF ULCERS  *URINARY PROBLEMS  *BOWEL PROBLEMS  UNUSUAL RASH Items with * indicate a potential emergency and should be followed up as soon as possible.  Feel free to call the clinic you have any questions or concerns. The clinic phone number is [phone].  Please show the CHEMO ALERT CARD at check-in to the Emergency Department and triage nurse.   

## 2015-06-03 NOTE — Progress Notes (Signed)
Patient Care Team: Seward Carol, MD as PCP - General (Internal Medicine)  DIAGNOSIS: metastatic breast cancer  SUMMARY OF ONCOLOGIC HISTORY:   Breast cancer of upper-outer quadrant of right female breast ([place])   08/15/2014 Initial Diagnosis Right Breast Inflammatory invasive ductal cancer: 17 cm by ultrasound along with 13 cm ulceration, node also positive T4N1 (stage 3C) ER 0%, PR 0%, Her-2 Pending   08/22/2014 PET scan Metastatic disease. In addition to Right breast, hypermetabolic disease in Rt Supraclav LN, axilla and mediastinum, bulky disease in liver, Pulm Mets, Path fracture 9th rib   09/03/2014 - 11/26/2014 Chemotherapy Herceptin Perjeta started 09/03/2014, Taxol weekly added 09/10/2014   10/17/2014 Imaging CT scans: Decrease in right axillary lymph node, decreased skin thickening of the right breast, decreased in bilateral pulmonary nodules, decrease in liver metastases   12/09/2014 PET scan Remarkable response to chemotherapy resolution of liver metastases, marked improvement in the tumor in the breast, marked improvement in right axillary lymph nodes, resolution of lung nodules, improvement in left sixth rib   12/17/2014 -  Chemotherapy Herceptin and Perjeta maintenance every 3 weeks    CHIEF COMPLIANT: follow-up after recent scans on Herceptin Perjeta maintenance  INTERVAL HISTORY: Denise Morton is a 65 year old with above-mentioned history metastatic breast cancer with liver metastases was currently on Herceptin for general maintenance. She had recent CT chest abdomen and pelvis which showed no evidence of metastatic disease. She appears to be tolerating Herceptin and Perjeta fairly well. She had diarrhea but it has apparently been attributed to metformin. Dr. Delfina Redwood has been adjusting her medications and she is doing much better. She does take Imodium every other day. This appears to be working very well for her. She does feel mildly tired.  REVIEW OF SYSTEMS:   Constitutional:  Denies fevers, chills or abnormal weight loss Eyes: Denies blurriness of vision Ears, nose, mouth, throat, and face: Denies mucositis or sore throat Respiratory: Denies cough, dyspnea or wheezes Cardiovascular: Denies palpitation, chest discomfort Gastrointestinal:  Denies nausea, heartburn or change in bowel habits Skin: Denies abnormal skin rashes Lymphatics: Denies new lymphadenopathy or easy bruising Neurological:Denies numbness, tingling or new weaknesses Behavioral/Psych: Mood is stable, no new changes  Extremities: No lower extremity edema Breast:  denies any pain or lumps or nodules in either breasts All other systems were reviewed with the patient and are negative.  I have reviewed the past medical history, past surgical history, social history and family history with the patient and they are unchanged from previous note.  ALLERGIES:  is allergic to penicillin g and sulfa antibiotics.  MEDICATIONS:  Current Outpatient Prescriptions  Medication Sig Dispense Refill  . ALPRAZolam (XANAX) 0.25 MG tablet Take 0.25 mg by mouth daily as needed for anxiety.    . carvedilol (COREG) 12.5 MG tablet Take 1 tablet (12.5 mg total) by mouth 2 (two) times daily with a meal. 60 tablet 0  . gabapentin (NEURONTIN) 100 MG capsule Take 1 capsule (100 mg total) by mouth 2 (two) times daily. 60 capsule 3  . lidocaine-prilocaine (EMLA) cream Apply to port 1-2 hours before procedure 30 g 1  . loperamide (IMODIUM) 2 MG capsule Take 2 mg by mouth as needed for diarrhea or loose stools.    . Multiple Vitamin (MULTIVITAMIN WITH MINERALS) TABS tablet Take 1 tablet by mouth daily.    . nicotine polacrilex (COMMIT) 2 MG lozenge Take 2 mg by mouth as needed for smoking cessation.    . ondansetron (ZOFRAN) 8 MG tablet TAKE ONE TABLET  BY MOUTH TWICE DAILY. START THE DAY AFTER CHEMO FOR TWO DAYS. THEN TAKE AS NEEDED FOR NAUSEA OR FOR VOMITING  1  . prochlorperazine (COMPAZINE) 10 MG tablet TAKE ONE TABLET BY MOUTH  EVERY 6 HOURS AS NEEDED FOR NAUSEA OR FOR VOMITING  1   No current facility-administered medications for this visit.   Facility-Administered Medications Ordered in Other Visits  Medication Dose Route Frequency Provider Last Rate Last Dose  . sodium chloride 0.9 % injection 10 mL  10 mL Intracatheter PRN Nicholas Lose, MD   10 mL at 02/18/15 1111    PHYSICAL EXAMINATION: ECOG PERFORMANCE STATUS: 1 - Symptomatic but completely ambulatory  Filed Vitals:   06/03/15 0818  BP: 121/88  Pulse: 66  Temp: 98.2 F (36.8 C)  Resp: 18   Filed Weights   06/03/15 0818  Weight: 224 lb 8 oz (101.833 kg)    GENERAL:alert, no distress and comfortable SKIN: skin color, texture, turgor are normal, no rashes or significant lesions EYES: normal, Conjunctiva are pink and non-injected, sclera clear OROPHARYNX:no exudate, no erythema and lips, buccal mucosa, and tongue normal  NECK: supple, thyroid normal size, non-tender, without nodularity LYMPH:  no palpable lymphadenopathy in the cervical, axillary or inguinal LUNGS: clear to auscultation and percussion with normal breathing effort HEART: regular rate & rhythm and no murmurs and no lower extremity edema ABDOMEN:abdomen soft, non-tender and normal bowel sounds MUSCULOSKELETAL:no cyanosis of digits and no clubbing  NEURO: alert & oriented x 3 with fluent speech, no focal motor/sensory deficits EXTREMITIES: No lower extremity edema  LABORATORY DATA:  I have reviewed the data as listed   Chemistry      Component Value Date/Time   NA 141 05/13/2015 0854   NA 140 12/03/2006 1854   K 4.2 05/13/2015 0854   K 3.8 12/03/2006 1854   CL 108 12/03/2006 1854   CO2 21* 05/13/2015 0854   BUN 26.0 05/13/2015 0854   BUN 21 12/03/2006 1854   CREATININE 1.0 05/13/2015 0854   CREATININE 1.0 12/03/2006 1854      Component Value Date/Time   CALCIUM 9.2 05/13/2015 0854   ALKPHOS 75 05/13/2015 0854   AST 12 05/13/2015 0854   ALT 14 05/13/2015 0854    BILITOT 0.36 05/13/2015 0854       Lab Results  Component Value Date   WBC 6.1 06/03/2015   HGB 15.2 06/03/2015   HCT 46.9* 06/03/2015   MCV 87.3 06/03/2015   PLT 200 06/03/2015   NEUTROABS 3.5 06/03/2015     ASSESSMENT & PLAN:  Breast cancer of 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 In addition to Right breast, hypermetabolic disease in Rt Supraclav LN, axilla and mediastinum, bulky disease in liver, Pulm Mets, Path fracture 9th rib   Treatment summary:Taxol weekly(started 09/10/2014 )Herceptin Perjeta every 3 weeks(started 09/03/2014) Received 10 Weeks Taxol, Herceptin Perjeta given once every 3 weeks (8/17 and 9/7 and 9/28, 10/19) ; changed chemo from Taxol to Abraxane for bronchospasm 11/19/14 and completed 12/03/14   PET CT scan 12/09/2014: Complete response in the liver, marked improvement in metastatic disease in the right breast and right axilla, improvement in bone metastases, decrease in lymphadenopathy throughout. Resolution of lung nodules. CT CAP 03/09/15: No new lung mets, stable ovoid nodule in post lateral right breast, No evidence  of met disease in abd, low density lesions in liver are unchanged. CT CAP 06/01/2015:no evidence of metastatic disease in the liver with continued complete response to treatment.  Current Treatment: Herceptin Perjeta maintenance started 12/17/14 Chemotherapy toxicities: 1. 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 Started the patient on Neurontin 100 mg by mouth twice a day  Plan:  1. continue Herceptin Perjeta  Hypertension: Being managed by Dr. Delfina Redwood. Much improved  Return to clinic in 3 weeks for Herceptin and Perjeta and in 9 weeks for clinic  follow-up. Labs every 9 weeks    No orders of the defined types were placed in this encounter.   The patient has a good understanding of the overall plan. she agrees with it. she will call with any problems that may develop before the next visit here.   Rulon Eisenmenger, MD 06/03/2015

## 2015-06-24 ENCOUNTER — Other Ambulatory Visit: Payer: Self-pay

## 2015-06-24 ENCOUNTER — Ambulatory Visit (HOSPITAL_BASED_OUTPATIENT_CLINIC_OR_DEPARTMENT_OTHER): Payer: PPO

## 2015-06-24 ENCOUNTER — Other Ambulatory Visit (HOSPITAL_BASED_OUTPATIENT_CLINIC_OR_DEPARTMENT_OTHER): Payer: PPO

## 2015-06-24 ENCOUNTER — Telehealth: Payer: Self-pay

## 2015-06-24 VITALS — BP 97/64 | HR 67 | Temp 97.9°F | Resp 18

## 2015-06-24 DIAGNOSIS — C50411 Malignant neoplasm of upper-outer quadrant of right female breast: Secondary | ICD-10-CM

## 2015-06-24 DIAGNOSIS — Z5112 Encounter for antineoplastic immunotherapy: Secondary | ICD-10-CM | POA: Diagnosis not present

## 2015-06-24 LAB — CBC WITH DIFFERENTIAL/PLATELET
BASO%: 0.6 % (ref 0.0–2.0)
Basophils Absolute: 0 10*3/uL (ref 0.0–0.1)
EOS%: 1 % (ref 0.0–7.0)
Eosinophils Absolute: 0.1 10*3/uL (ref 0.0–0.5)
HCT: 47.4 % — ABNORMAL HIGH (ref 34.8–46.6)
HGB: 15.2 g/dL (ref 11.6–15.9)
LYMPH%: 31.7 % (ref 14.0–49.7)
MCH: 28.5 pg (ref 25.1–34.0)
MCHC: 32.1 g/dL (ref 31.5–36.0)
MCV: 88.8 fL (ref 79.5–101.0)
MONO#: 0.4 10*3/uL (ref 0.1–0.9)
MONO%: 5.4 % (ref 0.0–14.0)
NEUT%: 61.3 % (ref 38.4–76.8)
NEUTROS ABS: 4.4 10*3/uL (ref 1.5–6.5)
Platelets: 191 10*3/uL (ref 145–400)
RBC: 5.33 10*6/uL (ref 3.70–5.45)
RDW: 16.2 % — ABNORMAL HIGH (ref 11.2–14.5)
WBC: 7.2 10*3/uL (ref 3.9–10.3)
lymph#: 2.3 10*3/uL (ref 0.9–3.3)

## 2015-06-24 LAB — COMPREHENSIVE METABOLIC PANEL
ALT: 17 U/L (ref 0–55)
AST: 13 U/L (ref 5–34)
Albumin: 3.3 g/dL — ABNORMAL LOW (ref 3.5–5.0)
Alkaline Phosphatase: 81 U/L (ref 40–150)
Anion Gap: 10 mEq/L (ref 3–11)
BUN: 32.9 mg/dL — ABNORMAL HIGH (ref 7.0–26.0)
CHLORIDE: 108 meq/L (ref 98–109)
CO2: 20 meq/L — AB (ref 22–29)
CREATININE: 1 mg/dL (ref 0.6–1.1)
Calcium: 9.3 mg/dL (ref 8.4–10.4)
EGFR: 58 mL/min/{1.73_m2} — ABNORMAL LOW (ref >90)
GLUCOSE: 203 mg/dL — AB (ref 70–140)
Potassium: 4.1 mEq/L (ref 3.5–5.1)
SODIUM: 138 meq/L (ref 136–145)
TOTAL PROTEIN: 6.9 g/dL (ref 6.4–8.3)

## 2015-06-24 MED ORDER — DIPHENHYDRAMINE HCL 25 MG PO CAPS
ORAL_CAPSULE | ORAL | Status: AC
  Filled 2015-06-24: qty 1

## 2015-06-24 MED ORDER — DIPHENHYDRAMINE HCL 25 MG PO CAPS
25.0000 mg | ORAL_CAPSULE | Freq: Once | ORAL | Status: AC
  Administered 2015-06-24: 25 mg via ORAL

## 2015-06-24 MED ORDER — ACETAMINOPHEN 325 MG PO TABS
ORAL_TABLET | ORAL | Status: AC
  Filled 2015-06-24: qty 2

## 2015-06-24 MED ORDER — SODIUM CHLORIDE 0.9 % IJ SOLN
10.0000 mL | INTRAMUSCULAR | Status: DC | PRN
  Administered 2015-06-24: 10 mL
  Filled 2015-06-24: qty 10

## 2015-06-24 MED ORDER — SODIUM CHLORIDE 0.9 % IV SOLN
Freq: Once | INTRAVENOUS | Status: AC
  Administered 2015-06-24: 09:00:00 via INTRAVENOUS

## 2015-06-24 MED ORDER — ACETAMINOPHEN 325 MG PO TABS
650.0000 mg | ORAL_TABLET | Freq: Once | ORAL | Status: AC
  Administered 2015-06-24: 650 mg via ORAL

## 2015-06-24 MED ORDER — SODIUM CHLORIDE 0.9 % IV SOLN
420.0000 mg | Freq: Once | INTRAVENOUS | Status: AC
  Administered 2015-06-24: 420 mg via INTRAVENOUS
  Filled 2015-06-24: qty 14

## 2015-06-24 MED ORDER — HEPARIN SOD (PORK) LOCK FLUSH 100 UNIT/ML IV SOLN
500.0000 [IU] | Freq: Once | INTRAVENOUS | Status: AC | PRN
  Administered 2015-06-24: 500 [IU]
  Filled 2015-06-24: qty 5

## 2015-06-24 MED ORDER — TRASTUZUMAB CHEMO INJECTION 440 MG
6.0000 mg/kg | Freq: Once | INTRAVENOUS | Status: AC
  Administered 2015-06-24: 630 mg via INTRAVENOUS
  Filled 2015-06-24: qty 30

## 2015-06-24 NOTE — Patient Instructions (Signed)
[facility] Discharge Instructions for Patients Receiving Chemotherapy  Today you received the following chemotherapy agents Herceptin and Perjeta   To help prevent nausea and vomiting after your treatment, we encourage you to take your nausea medication as directed.    If you develop nausea and vomiting that is not controlled by your nausea medication, call the clinic.   BELOW ARE SYMPTOMS THAT SHOULD BE REPORTED IMMEDIATELY:  *FEVER GREATER THAN 100.5 F  *CHILLS WITH OR WITHOUT FEVER  NAUSEA AND VOMITING THAT IS NOT CONTROLLED WITH YOUR NAUSEA MEDICATION  *UNUSUAL SHORTNESS OF BREATH  *UNUSUAL BRUISING OR BLEEDING  TENDERNESS IN MOUTH AND THROAT WITH OR WITHOUT PRESENCE OF ULCERS  *URINARY PROBLEMS  *BOWEL PROBLEMS  UNUSUAL RASH Items with * indicate a potential emergency and should be followed up as soon as possible.  Feel free to call the clinic you have any questions or concerns. The clinic phone number is [phone].  Please show the CHEMO ALERT CARD at check-in to the Emergency Department and triage nurse.   

## 2015-06-24 NOTE — Progress Notes (Signed)
Pt chose to stay for 21min observation after Perjeta dose, pt stated that she felt o.k., stable, AVS given and pt left. Discussed side effects to be aware of, pt verbalized understanding.

## 2015-06-24 NOTE — Telephone Encounter (Signed)
Received message from pharmacy stating pt overdue for ECHO.  Order placed for pt to have this done as soon as possible.  Spoke with pt who wishes to call and schedule this herself once she has her schedule in front of her.  Contact information for scheduling given to pt.  Pt verbalized understanding and without further questions or concerns at time of call.

## 2015-06-29 ENCOUNTER — Ambulatory Visit (HOSPITAL_COMMUNITY)
Admission: RE | Admit: 2015-06-29 | Discharge: 2015-06-29 | Disposition: A | Discharge status: Home / Self Care | Payer: PPO | Source: Ambulatory Visit | Attending: Hematology and Oncology | Admitting: Hematology and Oncology

## 2015-06-29 DIAGNOSIS — Z9223 Personal history of estrogen therapy: Secondary | ICD-10-CM | POA: Diagnosis not present

## 2015-06-29 DIAGNOSIS — C50411 Malignant neoplasm of upper-outer quadrant of right female breast: Secondary | ICD-10-CM | POA: Diagnosis not present

## 2015-06-29 LAB — ECHOCARDIOGRAM COMPLETE
E decel time: 310 msec
EERAT: 3.58
FS: 18 % — AB (ref 28–44)
IVS/LV PW RATIO, ED: 1.44
LA ID, A-P, ES: 37 mm
LA diam index: 1.68 cm/m2
LA vol A4C: 64.4 ml
LAVOL: 66.2 mL
LAVOLIN: 30 mL/m2
LDCA: 3.8 cm2
LEFT ATRIUM END SYS DIAM: 37 mm
LV E/e' medial: 3.58
LV PW d: 13.3 mm — AB (ref 0.6–1.1)
LV SIMPSON'S DISK: 60
LV TDI E'MEDIAL: 3.59
LV dias vol index: 39 mL/m2
LV dias vol: 86 mL (ref 46–106)
LVEEAVG: 3.58
LVELAT: 10.7 cm/s
LVOT diameter: 22 mm
LVSYSVOL: 35 mL (ref 14–42)
LVSYSVOLIN: 16 mL/m2
MV Dec: 310
MV pk A vel: 63.1 m/s
MVPKEVEL: 38.3 m/s
Stroke v: 51 ml
TAPSE: 11.4 mm
TDI e' lateral: 10.7

## 2015-06-29 NOTE — Progress Notes (Signed)
  Echocardiogram 2D Echocardiogram has been performed.  Jennette Dubin 06/29/2015, 9:44 AM

## 2015-07-01 ENCOUNTER — Other Ambulatory Visit: Payer: Self-pay | Admitting: Hematology and Oncology

## 2015-07-01 DIAGNOSIS — C50411 Malignant neoplasm of upper-outer quadrant of right female breast: Secondary | ICD-10-CM

## 2015-07-15 ENCOUNTER — Ambulatory Visit (HOSPITAL_BASED_OUTPATIENT_CLINIC_OR_DEPARTMENT_OTHER): Payer: PPO

## 2015-07-15 ENCOUNTER — Other Ambulatory Visit (HOSPITAL_BASED_OUTPATIENT_CLINIC_OR_DEPARTMENT_OTHER): Payer: PPO

## 2015-07-15 VITALS — BP 122/74 | HR 67 | Temp 98.0°F | Resp 16

## 2015-07-15 DIAGNOSIS — C50411 Malignant neoplasm of upper-outer quadrant of right female breast: Secondary | ICD-10-CM

## 2015-07-15 DIAGNOSIS — Z5112 Encounter for antineoplastic immunotherapy: Secondary | ICD-10-CM | POA: Diagnosis not present

## 2015-07-15 LAB — COMPREHENSIVE METABOLIC PANEL
ALBUMIN: 3.3 g/dL — AB (ref 3.5–5.0)
ALK PHOS: 81 U/L (ref 40–150)
ALT: 17 U/L (ref 0–55)
AST: 12 U/L (ref 5–34)
Anion Gap: 10 mEq/L (ref 3–11)
BUN: 20.8 mg/dL (ref 7.0–26.0)
CO2: 22 mEq/L (ref 22–29)
CREATININE: 0.8 mg/dL (ref 0.6–1.1)
Calcium: 9.4 mg/dL (ref 8.4–10.4)
Chloride: 108 mEq/L (ref 98–109)
EGFR: 77 mL/min/{1.73_m2} — ABNORMAL LOW (ref >90)
GLUCOSE: 140 mg/dL (ref 70–140)
POTASSIUM: 4.2 meq/L (ref 3.5–5.1)
SODIUM: 140 meq/L (ref 136–145)
TOTAL PROTEIN: 7.1 g/dL (ref 6.4–8.3)
Total Bilirubin: <0.3 mg/dL (ref 0.20–1.20)

## 2015-07-15 LAB — CBC WITH DIFFERENTIAL/PLATELET
BASO%: 0.3 % (ref 0.0–2.0)
Basophils Absolute: 0 10*3/uL (ref 0.0–0.1)
EOS%: 1.6 % (ref 0.0–7.0)
Eosinophils Absolute: 0.1 10*3/uL (ref 0.0–0.5)
HCT: 45.8 % (ref 34.8–46.6)
HEMOGLOBIN: 14.9 g/dL (ref 11.6–15.9)
LYMPH#: 2 10*3/uL (ref 0.9–3.3)
LYMPH%: 29.4 % (ref 14.0–49.7)
MCH: 29.4 pg (ref 25.1–34.0)
MCHC: 32.5 g/dL (ref 31.5–36.0)
MCV: 90.5 fL (ref 79.5–101.0)
MONO#: 0.4 10*3/uL (ref 0.1–0.9)
MONO%: 6.5 % (ref 0.0–14.0)
NEUT%: 62.2 % (ref 38.4–76.8)
NEUTROS ABS: 4.2 10*3/uL (ref 1.5–6.5)
Platelets: 224 10*3/uL (ref 145–400)
RBC: 5.06 10*6/uL (ref 3.70–5.45)
RDW: 15 % — ABNORMAL HIGH (ref 11.2–14.5)
WBC: 6.8 10*3/uL (ref 3.9–10.3)

## 2015-07-15 MED ORDER — ACETAMINOPHEN 325 MG PO TABS
ORAL_TABLET | ORAL | Status: AC
  Filled 2015-07-15: qty 2

## 2015-07-15 MED ORDER — SODIUM CHLORIDE 0.9 % IJ SOLN
10.0000 mL | INTRAMUSCULAR | Status: DC | PRN
  Administered 2015-07-15: 10 mL
  Filled 2015-07-15: qty 10

## 2015-07-15 MED ORDER — TRASTUZUMAB CHEMO INJECTION 440 MG
6.0000 mg/kg | Freq: Once | INTRAVENOUS | Status: AC
  Administered 2015-07-15: 630 mg via INTRAVENOUS
  Filled 2015-07-15: qty 30

## 2015-07-15 MED ORDER — ACETAMINOPHEN 325 MG PO TABS
650.0000 mg | ORAL_TABLET | Freq: Once | ORAL | Status: AC
  Administered 2015-07-15: 650 mg via ORAL

## 2015-07-15 MED ORDER — SODIUM CHLORIDE 0.9 % IV SOLN
Freq: Once | INTRAVENOUS | Status: AC
  Administered 2015-07-15: 09:00:00 via INTRAVENOUS

## 2015-07-15 MED ORDER — HEPARIN SOD (PORK) LOCK FLUSH 100 UNIT/ML IV SOLN
500.0000 [IU] | Freq: Once | INTRAVENOUS | Status: AC | PRN
  Administered 2015-07-15: 500 [IU]
  Filled 2015-07-15: qty 5

## 2015-07-15 MED ORDER — DIPHENHYDRAMINE HCL 25 MG PO CAPS
25.0000 mg | ORAL_CAPSULE | Freq: Once | ORAL | Status: AC
Start: 2015-07-15 — End: 2015-07-15
  Administered 2015-07-15: 25 mg via ORAL

## 2015-07-15 MED ORDER — DIPHENHYDRAMINE HCL 25 MG PO CAPS
ORAL_CAPSULE | ORAL | Status: AC
  Filled 2015-07-15: qty 1

## 2015-07-15 MED ORDER — PERTUZUMAB CHEMO INJECTION 420 MG/14ML
420.0000 mg | Freq: Once | INTRAVENOUS | Status: AC
  Administered 2015-07-15: 420 mg via INTRAVENOUS
  Filled 2015-07-15: qty 14

## 2015-07-15 NOTE — Patient Instructions (Signed)
[facility] Discharge Instructions for Patients Receiving Chemotherapy  Today you received the following chemotherapy agents Herceptin and Perjeta   To help prevent nausea and vomiting after your treatment, we encourage you to take your nausea medication as directed.    If you develop nausea and vomiting that is not controlled by your nausea medication, call the clinic.   BELOW ARE SYMPTOMS THAT SHOULD BE REPORTED IMMEDIATELY:  *FEVER GREATER THAN 100.5 F  *CHILLS WITH OR WITHOUT FEVER  NAUSEA AND VOMITING THAT IS NOT CONTROLLED WITH YOUR NAUSEA MEDICATION  *UNUSUAL SHORTNESS OF BREATH  *UNUSUAL BRUISING OR BLEEDING  TENDERNESS IN MOUTH AND THROAT WITH OR WITHOUT PRESENCE OF ULCERS  *URINARY PROBLEMS  *BOWEL PROBLEMS  UNUSUAL RASH Items with * indicate a potential emergency and should be followed up as soon as possible.  Feel free to call the clinic you have any questions or concerns. The clinic phone number is [phone].  Please show the CHEMO ALERT CARD at check-in to the Emergency Department and triage nurse.   

## 2015-08-05 ENCOUNTER — Encounter: Payer: Self-pay | Admitting: Hematology and Oncology

## 2015-08-05 ENCOUNTER — Ambulatory Visit (HOSPITAL_BASED_OUTPATIENT_CLINIC_OR_DEPARTMENT_OTHER): Payer: PPO

## 2015-08-05 ENCOUNTER — Ambulatory Visit (HOSPITAL_BASED_OUTPATIENT_CLINIC_OR_DEPARTMENT_OTHER): Payer: PPO | Admitting: Hematology and Oncology

## 2015-08-05 ENCOUNTER — Telehealth: Payer: Self-pay | Admitting: Hematology and Oncology

## 2015-08-05 ENCOUNTER — Other Ambulatory Visit (HOSPITAL_BASED_OUTPATIENT_CLINIC_OR_DEPARTMENT_OTHER): Payer: PPO

## 2015-08-05 VITALS — BP 113/75 | HR 59 | Temp 97.5°F | Resp 16

## 2015-08-05 VITALS — BP 125/87 | HR 71 | Temp 98.4°F | Resp 18 | Wt 221.6 lb

## 2015-08-05 DIAGNOSIS — G620 Drug-induced polyneuropathy: Secondary | ICD-10-CM

## 2015-08-05 DIAGNOSIS — C778 Secondary and unspecified malignant neoplasm of lymph nodes of multiple regions: Secondary | ICD-10-CM

## 2015-08-05 DIAGNOSIS — C7951 Secondary malignant neoplasm of bone: Secondary | ICD-10-CM

## 2015-08-05 DIAGNOSIS — Z171 Estrogen receptor negative status [ER-]: Secondary | ICD-10-CM

## 2015-08-05 DIAGNOSIS — C50411 Malignant neoplasm of upper-outer quadrant of right female breast: Secondary | ICD-10-CM | POA: Diagnosis not present

## 2015-08-05 DIAGNOSIS — C787 Secondary malignant neoplasm of liver and intrahepatic bile duct: Secondary | ICD-10-CM | POA: Diagnosis not present

## 2015-08-05 DIAGNOSIS — C7800 Secondary malignant neoplasm of unspecified lung: Secondary | ICD-10-CM | POA: Diagnosis not present

## 2015-08-05 DIAGNOSIS — Z5112 Encounter for antineoplastic immunotherapy: Secondary | ICD-10-CM

## 2015-08-05 LAB — CBC WITH DIFFERENTIAL/PLATELET
BASO%: 0.7 % (ref 0.0–2.0)
Basophils Absolute: 0 10*3/uL (ref 0.0–0.1)
EOS ABS: 0.1 10*3/uL (ref 0.0–0.5)
EOS%: 1 % (ref 0.0–7.0)
HCT: 48.5 % — ABNORMAL HIGH (ref 34.8–46.6)
HEMOGLOBIN: 15.9 g/dL (ref 11.6–15.9)
LYMPH%: 35.9 % (ref 14.0–49.7)
MCH: 29.2 pg (ref 25.1–34.0)
MCHC: 32.7 g/dL (ref 31.5–36.0)
MCV: 89.3 fL (ref 79.5–101.0)
MONO#: 0.5 10*3/uL (ref 0.1–0.9)
MONO%: 6.2 % (ref 0.0–14.0)
NEUT%: 56.2 % (ref 38.4–76.8)
NEUTROS ABS: 4.2 10*3/uL (ref 1.5–6.5)
PLATELETS: 203 10*3/uL (ref 145–400)
RBC: 5.43 10*6/uL (ref 3.70–5.45)
RDW: 14.6 % — AB (ref 11.2–14.5)
WBC: 7.5 10*3/uL (ref 3.9–10.3)
lymph#: 2.7 10*3/uL (ref 0.9–3.3)

## 2015-08-05 LAB — COMPREHENSIVE METABOLIC PANEL
ALBUMIN: 3.3 g/dL — AB (ref 3.5–5.0)
ALK PHOS: 86 U/L (ref 40–150)
ALT: 16 U/L (ref 0–55)
ANION GAP: 10 meq/L (ref 3–11)
AST: 14 U/L (ref 5–34)
BILIRUBIN TOTAL: 0.47 mg/dL (ref 0.20–1.20)
BUN: 18.2 mg/dL (ref 7.0–26.0)
CO2: 23 meq/L (ref 22–29)
CREATININE: 0.8 mg/dL (ref 0.6–1.1)
Calcium: 9.5 mg/dL (ref 8.4–10.4)
Chloride: 106 mEq/L (ref 98–109)
EGFR: 73 mL/min/{1.73_m2} — AB (ref >90)
GLUCOSE: 128 mg/dL (ref 70–140)
Potassium: 3.9 mEq/L (ref 3.5–5.1)
SODIUM: 139 meq/L (ref 136–145)
TOTAL PROTEIN: 7 g/dL (ref 6.4–8.3)

## 2015-08-05 MED ORDER — ACETAMINOPHEN 325 MG PO TABS
650.0000 mg | ORAL_TABLET | Freq: Once | ORAL | Status: AC
  Administered 2015-08-05: 650 mg via ORAL

## 2015-08-05 MED ORDER — ACETAMINOPHEN 325 MG PO TABS
ORAL_TABLET | ORAL | Status: AC
  Filled 2015-08-05: qty 2

## 2015-08-05 MED ORDER — SODIUM CHLORIDE 0.9 % IJ SOLN
10.0000 mL | INTRAMUSCULAR | Status: DC | PRN
  Administered 2015-08-05: 10 mL
  Filled 2015-08-05: qty 10

## 2015-08-05 MED ORDER — TRASTUZUMAB CHEMO INJECTION 440 MG
6.0000 mg/kg | Freq: Once | INTRAVENOUS | Status: AC
  Administered 2015-08-05: 630 mg via INTRAVENOUS
  Filled 2015-08-05: qty 30

## 2015-08-05 MED ORDER — METFORMIN HCL ER (MOD) 500 MG PO TB24: 500.0000 mg | ORAL_TABLET | Freq: Every day | ORAL | Status: DC

## 2015-08-05 MED ORDER — LOSARTAN POTASSIUM-HCTZ 50-12.5 MG PO TABS: 1.0000 | ORAL_TABLET | Freq: Every day | ORAL | Status: DC

## 2015-08-05 MED ORDER — GABAPENTIN 300 MG PO CAPS: 300.0000 mg | ORAL_CAPSULE | Freq: Three times a day (TID) | ORAL | Status: DC

## 2015-08-05 MED ORDER — HEPARIN SOD (PORK) LOCK FLUSH 100 UNIT/ML IV SOLN
500.0000 [IU] | Freq: Once | INTRAVENOUS | Status: AC | PRN
  Administered 2015-08-05: 500 [IU]
  Filled 2015-08-05: qty 5

## 2015-08-05 MED ORDER — SODIUM CHLORIDE 0.9 % IV SOLN
420.0000 mg | Freq: Once | INTRAVENOUS | Status: AC
  Administered 2015-08-05: 420 mg via INTRAVENOUS
  Filled 2015-08-05: qty 14

## 2015-08-05 MED ORDER — DIPHENHYDRAMINE HCL 25 MG PO CAPS
25.0000 mg | ORAL_CAPSULE | Freq: Once | ORAL | Status: AC
  Administered 2015-08-05: 25 mg via ORAL

## 2015-08-05 MED ORDER — SODIUM CHLORIDE 0.9 % IV SOLN
Freq: Once | INTRAVENOUS | Status: AC
  Administered 2015-08-05: 09:00:00 via INTRAVENOUS

## 2015-08-05 MED ORDER — DIPHENHYDRAMINE HCL 25 MG PO CAPS
ORAL_CAPSULE | ORAL | Status: AC
  Filled 2015-08-05: qty 1

## 2015-08-05 NOTE — Patient Instructions (Signed)
[facility] Discharge Instructions for Patients Receiving Chemotherapy  Today you received the following chemotherapy agents Herceptin and Perjeta   To help prevent nausea and vomiting after your treatment, we encourage you to take your nausea medication as directed.    If you develop nausea and vomiting that is not controlled by your nausea medication, call the clinic.   BELOW ARE SYMPTOMS THAT SHOULD BE REPORTED IMMEDIATELY:  *FEVER GREATER THAN 100.5 F  *CHILLS WITH OR WITHOUT FEVER  NAUSEA AND VOMITING THAT IS NOT CONTROLLED WITH YOUR NAUSEA MEDICATION  *UNUSUAL SHORTNESS OF BREATH  *UNUSUAL BRUISING OR BLEEDING  TENDERNESS IN MOUTH AND THROAT WITH OR WITHOUT PRESENCE OF ULCERS  *URINARY PROBLEMS  *BOWEL PROBLEMS  UNUSUAL RASH Items with * indicate a potential emergency and should be followed up as soon as possible.  Feel free to call the clinic you have any questions or concerns. The clinic phone number is [phone].  Please show the CHEMO ALERT CARD at check-in to the Emergency Department and triage nurse.   

## 2015-08-05 NOTE — Telephone Encounter (Signed)
appt made and avs printed °

## 2015-08-05 NOTE — Assessment & Plan Note (Signed)
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 In addition to Right breast, hypermetabolic disease in Rt Supraclav LN, axilla and mediastinum, bulky disease in liver, Pulm Mets, Path fracture 9th rib   Treatment summary:Taxol weekly(started 09/10/2014 )Herceptin Perjeta every 3 weeks(started 09/03/2014) Received 10 Weeks Taxol, Herceptin Perjeta given once every 3 weeks (8/17 and 9/7 and 9/28, 10/19) ; changed chemo from Taxol to Abraxane for bronchospasm 11/19/14 and completed 12/03/14   PET CT scan 12/09/2014: Complete response in the liver, marked improvement in metastatic disease in the right breast and right axilla, improvement in bone metastases, decrease in lymphadenopathy throughout. Resolution of lung nodules. CT CAP 03/09/15: No new lung mets, stable ovoid nodule in post lateral right breast, No evidence of met disease in abd, low density lesions in liver are unchanged. CT CAP 06/01/2015:no evidence of metastatic disease in the liver with continued complete response to treatment.  Current Treatment: Herceptin Perjeta maintenance started 12/17/14 Chemotherapy toxicities: 1. 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 Started the patient on Neurontin 100 mg by mouth twice a day  Plan:  1. continue Herceptin Perjeta  Hypertension: Being managed by Dr. Delfina Redwood. Much improved  Return to clinic in 3 weeks for Herceptin and Perjeta and in 9 weeks for clinic follow-up. Labs every 9 weeks with scans and follow-up

## 2015-08-05 NOTE — Progress Notes (Signed)
Patient Care Team: Seward Carol, MD as PCP - General (Internal Medicine)  DIAGNOSIS: Metastatic breast cancer  SUMMARY OF ONCOLOGIC HISTORY:   Breast cancer of upper-outer quadrant of right female breast ([place])   08/15/2014 Initial Diagnosis Right Breast Inflammatory invasive ductal cancer: 17 cm by ultrasound along with 13 cm ulceration, node also positive T4N1 (stage 3C) ER 0%, PR 0%, Her-2 Pending   08/22/2014 PET scan Metastatic disease. In addition to Right breast, hypermetabolic disease in Rt [address]raclav LN, axilla and mediastinum, bulky disease in liver, Pulm Mets, Path fracture 9th rib   09/03/2014 - 11/26/2014 Chemotherapy Herceptin Perjeta started 09/03/2014, Taxol weekly added 09/10/2014   10/17/2014 Imaging CT scans: Decrease in right axillary lymph node, decreased skin thickening of the right breast, decreased in bilateral pulmonary nodules, decrease in liver metastases   12/09/2014 PET scan Remarkable response to chemotherapy resolution of liver metastases, marked improvement in the tumor in the breast, marked improvement in right axillary lymph nodes, resolution of lung nodules, improvement in left sixth rib   12/17/2014 -  Chemotherapy Herceptin and Perjeta maintenance every 3 weeks    CHIEF COMPLIANT: Follow-up on Herceptin Perjeta maintenance  INTERVAL HISTORY: Denise Morton is a 65 year old with above-mentioned history of metastatic right breast cancer currently on Herceptin and Perjeta maintenance. She has been tolerating it fairly well. She does have very occasional loose stools. Denies any other problems or concerns. She thinks that the diarrhea may be related to metformin. She is seeing Dr. polite to discuss this further. She complains of sores in the inside of the nose related to runny nose.  REVIEW OF SYSTEMS:   Constitutional: Denies fevers, chills or abnormal weight loss Eyes: Denies blurriness of vision Ears, nose, mouth, throat, and face: Sores in the inside of the  nose Respiratory: Denies cough, dyspnea or wheezes Cardiovascular: Denies palpitation, chest discomfort Gastrointestinal:  Intermittent diarrhea  Skin: Denies abnormal skin rashes Lymphatics: Denies new lymphadenopathy or easy bruising Neurological:Denies numbness, tingling or new weaknesses Behavioral/Psych: Mood is stable, no new changes  Extremities: No lower extremity edema Breast:  denies any pain or lumps or nodules in either breasts All other systems were reviewed with the patient and are negative.  I have reviewed the past medical history, past surgical history, social history and family history with the patient and they are unchanged from previous note.  ALLERGIES:  is allergic to penicillin g and sulfa antibiotics.  MEDICATIONS:  Current Outpatient Prescriptions  Medication Sig Dispense Refill  . ALPRAZolam (XANAX) 0.25 MG tablet Take 0.25 mg by mouth daily as needed for anxiety.    . carvedilol (COREG) 12.5 MG tablet Take 1 tablet (12.5 mg total) by mouth 2 (two) times daily with a meal. 60 tablet 0  . gabapentin (NEURONTIN) 100 MG capsule TAKE ONE CAPSULE BY MOUTH TWICE DAILY 180 capsule 0  . lidocaine-prilocaine (EMLA) cream Apply to port 1-2 hours before procedure 30 g 1  . loperamide (IMODIUM) 2 MG capsule Take 2 mg by mouth as needed for diarrhea or loose stools.    . Multiple Vitamin (MULTIVITAMIN WITH MINERALS) TABS tablet Take 1 tablet by mouth daily.    . nicotine polacrilex (COMMIT) 2 MG lozenge Take 2 mg by mouth as needed for smoking cessation.    . ondansetron (ZOFRAN) 8 MG tablet TAKE ONE TABLET BY MOUTH TWICE DAILY. START THE DAY AFTER CHEMO FOR TWO DAYS. THEN TAKE AS NEEDED FOR NAUSEA OR FOR VOMITING  1  . prochlorperazine (COMPAZINE) 10 MG tablet  TAKE ONE TABLET BY MOUTH EVERY 6 HOURS AS NEEDED FOR NAUSEA OR FOR VOMITING  1   No current facility-administered medications for this visit.   Facility-Administered Medications Ordered in Other Visits  Medication  Dose Route Frequency Provider Last Rate Last Dose  . sodium chloride 0.9 % injection 10 mL  10 mL Intracatheter PRN Nicholas Lose, MD   10 mL at 02/18/15 1111    PHYSICAL EXAMINATION: ECOG PERFORMANCE STATUS: 1 - Symptomatic but completely ambulatory  Filed Vitals:   08/05/15 0814  BP: 125/87  Pulse: 71  Temp: 98.4 F (36.9 C)  Resp: 18   Filed Weights   08/05/15 0814  Weight: 221 lb 9.6 oz (100.517 kg)    GENERAL:alert, no distress and comfortable SKIN: skin color, texture, turgor are normal, no rashes or significant lesions EYES: normal, Conjunctiva are pink and non-injected, sclera clear OROPHARYNX:no exudate, no erythema and lips, buccal mucosa, and tongue normal  NECK: supple, thyroid normal size, non-tender, without nodularity LYMPH:  no palpable lymphadenopathy in the cervical, axillary or inguinal LUNGS: clear to auscultation and percussion with normal breathing effort HEART: regular rate & rhythm and no murmurs and no lower extremity edema ABDOMEN:abdomen soft, non-tender and normal bowel sounds MUSCULOSKELETAL:no cyanosis of digits and no clubbing  NEURO: alert & oriented x 3 with fluent speech, no focal motor/sensory deficits EXTREMITIES: No lower extremity edema  LABORATORY DATA:  I have reviewed the data as listed   Chemistry      Component Value Date/Time   NA 140 07/15/2015 0837   NA 140 12/03/2006 1854   K 4.2 07/15/2015 0837   K 3.8 12/03/2006 1854   CL 108 12/03/2006 1854   CO2 22 07/15/2015 0837   BUN 20.8 07/15/2015 0837   BUN 21 12/03/2006 1854   CREATININE 0.8 07/15/2015 0837   CREATININE 1.0 12/03/2006 1854      Component Value Date/Time   CALCIUM 9.4 07/15/2015 0837   ALKPHOS 81 07/15/2015 0837   AST 12 07/15/2015 0837   ALT 17 07/15/2015 0837   BILITOT <0.30 07/15/2015 0837       Lab Results  Component Value Date   WBC 7.5 08/05/2015   HGB 15.9 08/05/2015   HCT 48.5* 08/05/2015   MCV 89.3 08/05/2015   PLT 203 08/05/2015    NEUTROABS 4.2 08/05/2015     ASSESSMENT & PLAN:  Breast cancer of 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 In addition to Right breast, hypermetabolic disease in Rt Supraclav LN, axilla and mediastinum, bulky disease in liver, Pulm Mets, Path fracture 9th rib   Treatment summary:Taxol weekly(started 09/10/2014 )Herceptin Perjeta every 3 weeks(started 09/03/2014) Received 10 Weeks Taxol, Herceptin Perjeta given once every 3 weeks (8/17 and 9/7 and 9/28, 10/19) ; changed chemo from Taxol to Abraxane for bronchospasm 11/19/14 and completed 12/03/14   PET CT scan 12/09/2014: Complete response in the liver, marked improvement in metastatic disease in the right breast and right axilla, improvement in bone metastases, decrease in lymphadenopathy throughout. Resolution of lung nodules. CT CAP 03/09/15: No new lung mets, stable ovoid nodule in post lateral right breast, No evidence of met disease in abd, low density lesions in liver are unchanged. CT CAP 06/01/2015:no evidence of metastatic disease in the liver with continued complete response to treatment. --------------------------------------------------------------------------------------------------------------------------------------------------------------------------- Current  Treatment: Herceptin Perjeta maintenance started 12/17/14 Chemotherapy toxicities: 1. 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 I increased the Neurontin to 300 by mouth 3 times a day on 08/04/2068  Plan:  1. continue Herceptin Perjeta every 3 weeks  Hypertension: Being managed by Dr. Delfina Redwood. Much improved  Return to clinic in 3 weeks for Herceptin and Perjeta and in 9 weeks for clinic  follow-up. Labs every 9 weeks with scans and follow-up   No orders of the defined types were placed in this encounter.   The patient has a good understanding of the overall plan. she agrees with it. she will call with any problems that may develop before the next visit here.   Rulon Eisenmenger, MD 08/05/2015

## 2015-08-11 DIAGNOSIS — I10 Essential (primary) hypertension: Secondary | ICD-10-CM | POA: Diagnosis not present

## 2015-08-11 DIAGNOSIS — E139 Other specified diabetes mellitus without complications: Secondary | ICD-10-CM | POA: Diagnosis not present

## 2015-08-26 ENCOUNTER — Ambulatory Visit (HOSPITAL_BASED_OUTPATIENT_CLINIC_OR_DEPARTMENT_OTHER): Payer: PPO

## 2015-08-26 ENCOUNTER — Other Ambulatory Visit (HOSPITAL_BASED_OUTPATIENT_CLINIC_OR_DEPARTMENT_OTHER): Payer: PPO

## 2015-08-26 VITALS — BP 110/66 | HR 58 | Temp 98.1°F | Resp 17

## 2015-08-26 DIAGNOSIS — Z5112 Encounter for antineoplastic immunotherapy: Secondary | ICD-10-CM | POA: Diagnosis not present

## 2015-08-26 DIAGNOSIS — C50411 Malignant neoplasm of upper-outer quadrant of right female breast: Secondary | ICD-10-CM

## 2015-08-26 LAB — CBC WITH DIFFERENTIAL/PLATELET
BASO%: 0.8 % (ref 0.0–2.0)
Basophils Absolute: 0.1 10*3/uL (ref 0.0–0.1)
EOS ABS: 0.1 10*3/uL (ref 0.0–0.5)
EOS%: 1.1 % (ref 0.0–7.0)
HEMATOCRIT: 45 % (ref 34.8–46.6)
HGB: 14.7 g/dL (ref 11.6–15.9)
LYMPH#: 2 10*3/uL (ref 0.9–3.3)
LYMPH%: 31.8 % (ref 14.0–49.7)
MCH: 29.3 pg (ref 25.1–34.0)
MCHC: 32.7 g/dL (ref 31.5–36.0)
MCV: 89.5 fL (ref 79.5–101.0)
MONO#: 0.5 10*3/uL (ref 0.1–0.9)
MONO%: 7.5 % (ref 0.0–14.0)
NEUT#: 3.8 10*3/uL (ref 1.5–6.5)
NEUT%: 58.8 % (ref 38.4–76.8)
PLATELETS: 204 10*3/uL (ref 145–400)
RBC: 5.03 10*6/uL (ref 3.70–5.45)
RDW: 14.8 % — ABNORMAL HIGH (ref 11.2–14.5)
WBC: 6.4 10*3/uL (ref 3.9–10.3)

## 2015-08-26 LAB — COMPREHENSIVE METABOLIC PANEL
ALT: 10 U/L (ref 0–55)
ANION GAP: 9 meq/L (ref 3–11)
AST: 12 U/L (ref 5–34)
Albumin: 3.3 g/dL — ABNORMAL LOW (ref 3.5–5.0)
Alkaline Phosphatase: 81 U/L (ref 40–150)
BILIRUBIN TOTAL: 0.33 mg/dL (ref 0.20–1.20)
BUN: 22.9 mg/dL (ref 7.0–26.0)
CALCIUM: 9.9 mg/dL (ref 8.4–10.4)
CHLORIDE: 107 meq/L (ref 98–109)
CO2: 24 meq/L (ref 22–29)
CREATININE: 0.8 mg/dL (ref 0.6–1.1)
EGFR: 80 mL/min/{1.73_m2} — AB (ref >90)
Glucose: 143 mg/dl — ABNORMAL HIGH (ref 70–140)
Potassium: 4.1 mEq/L (ref 3.5–5.1)
Sodium: 141 mEq/L (ref 136–145)
TOTAL PROTEIN: 6.9 g/dL (ref 6.4–8.3)

## 2015-08-26 MED ORDER — SODIUM CHLORIDE 0.9 % IV SOLN
420.0000 mg | Freq: Once | INTRAVENOUS | Status: AC
  Administered 2015-08-26: 420 mg via INTRAVENOUS
  Filled 2015-08-26: qty 14

## 2015-08-26 MED ORDER — HEPARIN SOD (PORK) LOCK FLUSH 100 UNIT/ML IV SOLN
500.0000 [IU] | Freq: Once | INTRAVENOUS | Status: AC | PRN
  Administered 2015-08-26: 500 [IU]
  Filled 2015-08-26: qty 5

## 2015-08-26 MED ORDER — DIPHENHYDRAMINE HCL 25 MG PO CAPS
25.0000 mg | ORAL_CAPSULE | Freq: Once | ORAL | Status: AC
  Administered 2015-08-26: 25 mg via ORAL

## 2015-08-26 MED ORDER — ACETAMINOPHEN 325 MG PO TABS
650.0000 mg | ORAL_TABLET | Freq: Once | ORAL | Status: AC
  Administered 2015-08-26: 650 mg via ORAL

## 2015-08-26 MED ORDER — SODIUM CHLORIDE 0.9 % IJ SOLN
10.0000 mL | INTRAMUSCULAR | Status: DC | PRN
  Administered 2015-08-26: 10 mL
  Filled 2015-08-26: qty 10

## 2015-08-26 MED ORDER — SODIUM CHLORIDE 0.9 % IV SOLN
Freq: Once | INTRAVENOUS | Status: AC
  Administered 2015-08-26: 10:00:00 via INTRAVENOUS

## 2015-08-26 MED ORDER — DIPHENHYDRAMINE HCL 25 MG PO CAPS
ORAL_CAPSULE | ORAL | Status: AC
  Filled 2015-08-26: qty 1

## 2015-08-26 MED ORDER — TRASTUZUMAB CHEMO 150 MG IV SOLR
6.0000 mg/kg | Freq: Once | INTRAVENOUS | Status: AC
  Administered 2015-08-26: 630 mg via INTRAVENOUS
  Filled 2015-08-26: qty 30

## 2015-08-26 MED ORDER — TRASTUZUMAB CHEMO INJECTION 440 MG
6.0000 mg/kg | Freq: Once | INTRAVENOUS | Status: DC
  Filled 2015-08-26: qty 30

## 2015-08-26 MED ORDER — ACETAMINOPHEN 325 MG PO TABS
ORAL_TABLET | ORAL | Status: AC
  Filled 2015-08-26: qty 2

## 2015-08-26 NOTE — Patient Instructions (Signed)
[facility] Discharge Instructions for Patients Receiving Chemotherapy  Today you received the following chemotherapy agents:  Herceptin, Perjeta  To help prevent nausea and vomiting after your treatment, we encourage you to take your nausea medications as prescribed.   If you develop nausea and vomiting that is not controlled by your nausea medication, call the clinic.   BELOW ARE SYMPTOMS THAT SHOULD BE REPORTED IMMEDIATELY:  *FEVER GREATER THAN 100.5 F  *CHILLS WITH OR WITHOUT FEVER  NAUSEA AND VOMITING THAT IS NOT CONTROLLED WITH YOUR NAUSEA MEDICATION  *UNUSUAL SHORTNESS OF BREATH  *UNUSUAL BRUISING OR BLEEDING  TENDERNESS IN MOUTH AND THROAT WITH OR WITHOUT PRESENCE OF ULCERS  *URINARY PROBLEMS  *BOWEL PROBLEMS  UNUSUAL RASH Items with * indicate a potential emergency and should be followed up as soon as possible.  Feel free to call the clinic you have any questions or concerns. The clinic phone number is [phone].  Please show the [place] at check-in to the Emergency Department and triage nurse.

## 2015-08-27 ENCOUNTER — Encounter: Payer: Self-pay | Admitting: Hematology and Oncology

## 2015-08-27 NOTE — Progress Notes (Unsigned)
Gave physician form for doctor to complete to [facility] to have returned to me when done.

## 2015-08-27 NOTE — Progress Notes (Signed)
Patient returned my call to do application for [employer] online over the phone. Patient provided me the information as I did the application. Patient approved for copay assistance effective 08/27/15-08/26/16 with a look-back period 02/28/15-10/25/16. I advised patient that I would obtain a signature from the physician and fax to [employer]. She will be receiving a copy of the award letter as well as the copay card and I have a copy as well. Once everything is completed, I will email billing a copy of approval letter and POE to retro back the two dos she was billed and submit to [employer] for payment.. Patient verbalized understanding.

## 2015-08-27 NOTE — Progress Notes (Unsigned)
Called patient to confirm she had changed insurances and is now getting billed for Herceptin and Perjeta. Asked patient if she is interested in applying for copay assistance that would help with that OOP. Patient states yes. Asked patient if this was a good time to complete application over the phone. Patient states it is not but will call me back this afternoon after 3:30. 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 Patient verbalized understanding.

## 2015-09-01 NOTE — Telephone Encounter (Signed)
ERROR

## 2015-09-03 ENCOUNTER — Encounter: Payer: Self-pay | Admitting: Hematology and Oncology

## 2015-09-03 NOTE — Progress Notes (Signed)
Received signed physician form for [employer]. Faxed to [employer].Fax confirmation received ok per confirmation sheet.

## 2015-09-03 NOTE — Progress Notes (Signed)
Emailed copy of award letter and POE for [employer] copay assitance to [email or web address]  billing.

## 2015-09-16 ENCOUNTER — Ambulatory Visit (HOSPITAL_BASED_OUTPATIENT_CLINIC_OR_DEPARTMENT_OTHER): Payer: PPO

## 2015-09-16 ENCOUNTER — Other Ambulatory Visit (HOSPITAL_BASED_OUTPATIENT_CLINIC_OR_DEPARTMENT_OTHER): Payer: PPO

## 2015-09-16 VITALS — BP 130/74 | HR 54 | Temp 97.8°F | Resp 16

## 2015-09-16 DIAGNOSIS — C50411 Malignant neoplasm of upper-outer quadrant of right female breast: Secondary | ICD-10-CM

## 2015-09-16 DIAGNOSIS — Z5112 Encounter for antineoplastic immunotherapy: Secondary | ICD-10-CM

## 2015-09-16 LAB — CBC WITH DIFFERENTIAL/PLATELET
BASO%: 0.3 % (ref 0.0–2.0)
Basophils Absolute: 0 10*3/uL (ref 0.0–0.1)
EOS ABS: 0.1 10*3/uL (ref 0.0–0.5)
EOS%: 1.5 % (ref 0.0–7.0)
HCT: 43.6 % (ref 34.8–46.6)
HGB: 14.4 g/dL (ref 11.6–15.9)
LYMPH%: 33.9 % (ref 14.0–49.7)
MCH: 30.1 pg (ref 25.1–34.0)
MCHC: 33 g/dL (ref 31.5–36.0)
MCV: 91 fL (ref 79.5–101.0)
MONO#: 0.4 10*3/uL (ref 0.1–0.9)
MONO%: 6.7 % (ref 0.0–14.0)
NEUT%: 57.6 % (ref 38.4–76.8)
NEUTROS ABS: 3.7 10*3/uL (ref 1.5–6.5)
NRBC: 0 % (ref 0–0)
PLATELETS: 211 10*3/uL (ref 145–400)
RBC: 4.79 10*6/uL (ref 3.70–5.45)
RDW: 15 % — AB (ref 11.2–14.5)
WBC: 6.5 10*3/uL (ref 3.9–10.3)
lymph#: 2.2 10*3/uL (ref 0.9–3.3)

## 2015-09-16 LAB — COMPREHENSIVE METABOLIC PANEL
ALT: 13 U/L (ref 0–55)
ANION GAP: 10 meq/L (ref 3–11)
AST: 12 U/L (ref 5–34)
Albumin: 3.1 g/dL — ABNORMAL LOW (ref 3.5–5.0)
Alkaline Phosphatase: 73 U/L (ref 40–150)
BUN: 29.5 mg/dL — AB (ref 7.0–26.0)
CHLORIDE: 108 meq/L (ref 98–109)
CO2: 23 meq/L (ref 22–29)
Calcium: 9.6 mg/dL (ref 8.4–10.4)
Creatinine: 0.8 mg/dL (ref 0.6–1.1)
EGFR: 78 mL/min/{1.73_m2} — ABNORMAL LOW (ref >90)
GLUCOSE: 142 mg/dL — AB (ref 70–140)
Potassium: 4.1 mEq/L (ref 3.5–5.1)
SODIUM: 141 meq/L (ref 136–145)
TOTAL PROTEIN: 6.7 g/dL (ref 6.4–8.3)
Total Bilirubin: 0.44 mg/dL (ref 0.20–1.20)

## 2015-09-16 MED ORDER — DIPHENHYDRAMINE HCL 25 MG PO CAPS
ORAL_CAPSULE | ORAL | Status: AC
Start: 2015-09-16 — End: 2015-09-16
  Filled 2015-09-16: qty 1

## 2015-09-16 MED ORDER — SODIUM CHLORIDE 0.9 % IJ SOLN
10.0000 mL | INTRAMUSCULAR | Status: DC | PRN
Start: 2015-09-16 — End: 2015-09-16
  Administered 2015-09-16: 10 mL
  Filled 2015-09-16: qty 10

## 2015-09-16 MED ORDER — TRASTUZUMAB CHEMO INJECTION 440 MG
6.0000 mg/kg | Freq: Once | INTRAVENOUS | Status: AC
  Administered 2015-09-16: 630 mg via INTRAVENOUS
  Filled 2015-09-16: qty 30

## 2015-09-16 MED ORDER — SODIUM CHLORIDE 0.9 % IV SOLN
Freq: Once | INTRAVENOUS | Status: AC
  Administered 2015-09-16: 09:00:00 via INTRAVENOUS

## 2015-09-16 MED ORDER — HEPARIN SOD (PORK) LOCK FLUSH 100 UNIT/ML IV SOLN
500.0000 [IU] | Freq: Once | INTRAVENOUS | Status: AC | PRN
  Administered 2015-09-16: 500 [IU]
  Filled 2015-09-16: qty 5

## 2015-09-16 MED ORDER — ACETAMINOPHEN 325 MG PO TABS
650.0000 mg | ORAL_TABLET | Freq: Once | ORAL | Status: AC
  Administered 2015-09-16: 650 mg via ORAL

## 2015-09-16 MED ORDER — ACETAMINOPHEN 325 MG PO TABS
ORAL_TABLET | ORAL | Status: AC
  Filled 2015-09-16: qty 2

## 2015-09-16 MED ORDER — PERTUZUMAB CHEMO INJECTION 420 MG/14ML
420.0000 mg | Freq: Once | INTRAVENOUS | Status: AC
  Administered 2015-09-16: 420 mg via INTRAVENOUS
  Filled 2015-09-16: qty 14

## 2015-09-16 MED ORDER — DIPHENHYDRAMINE HCL 25 MG PO CAPS
25.0000 mg | ORAL_CAPSULE | Freq: Once | ORAL | Status: AC
  Administered 2015-09-16: 25 mg via ORAL

## 2015-09-16 NOTE — Patient Instructions (Signed)
[facility] Discharge Instructions for Patients Receiving Chemotherapy  Today you received the following chemotherapy agents Herceptin and Perjeta   To help prevent nausea and vomiting after your treatment, we encourage you to take your nausea medication as directed.    If you develop nausea and vomiting that is not controlled by your nausea medication, call the clinic.   BELOW ARE SYMPTOMS THAT SHOULD BE REPORTED IMMEDIATELY:  *FEVER GREATER THAN 100.5 F  *CHILLS WITH OR WITHOUT FEVER  NAUSEA AND VOMITING THAT IS NOT CONTROLLED WITH YOUR NAUSEA MEDICATION  *UNUSUAL SHORTNESS OF BREATH  *UNUSUAL BRUISING OR BLEEDING  TENDERNESS IN MOUTH AND THROAT WITH OR WITHOUT PRESENCE OF ULCERS  *URINARY PROBLEMS  *BOWEL PROBLEMS  UNUSUAL RASH Items with * indicate a potential emergency and should be followed up as soon as possible.  Feel free to call the clinic you have any questions or concerns. The clinic phone number is [phone].  Please show the CHEMO ALERT CARD at check-in to the Emergency Department and triage nurse.   

## 2015-09-16 NOTE — Progress Notes (Signed)
Patient does not want to stay the 30 minutes post observation.

## 2015-10-06 ENCOUNTER — Encounter (HOSPITAL_COMMUNITY): Payer: Self-pay

## 2015-10-06 ENCOUNTER — Ambulatory Visit (HOSPITAL_COMMUNITY)
Admission: RE | Admit: 2015-10-06 | Discharge: 2015-10-06 | Disposition: A | Discharge status: Home / Self Care | Payer: PPO | Source: Ambulatory Visit | Attending: Hematology and Oncology | Admitting: Hematology and Oncology

## 2015-10-06 DIAGNOSIS — C50411 Malignant neoplasm of upper-outer quadrant of right female breast: Secondary | ICD-10-CM | POA: Insufficient documentation

## 2015-10-06 DIAGNOSIS — C50911 Malignant neoplasm of unspecified site of right female breast: Secondary | ICD-10-CM | POA: Diagnosis not present

## 2015-10-06 DIAGNOSIS — D259 Leiomyoma of uterus, unspecified: Secondary | ICD-10-CM | POA: Diagnosis not present

## 2015-10-06 MED ORDER — IOPAMIDOL (ISOVUE-300) INJECTION 61%
100.0000 mL | Freq: Once | INTRAVENOUS | Status: AC | PRN
  Administered 2015-10-06: 100 mL via INTRAVENOUS

## 2015-10-07 ENCOUNTER — Ambulatory Visit (HOSPITAL_BASED_OUTPATIENT_CLINIC_OR_DEPARTMENT_OTHER): Payer: PPO | Admitting: Hematology and Oncology

## 2015-10-07 ENCOUNTER — Ambulatory Visit (HOSPITAL_BASED_OUTPATIENT_CLINIC_OR_DEPARTMENT_OTHER): Payer: PPO

## 2015-10-07 ENCOUNTER — Other Ambulatory Visit (HOSPITAL_BASED_OUTPATIENT_CLINIC_OR_DEPARTMENT_OTHER): Payer: PPO

## 2015-10-07 ENCOUNTER — Ambulatory Visit: Payer: PPO

## 2015-10-07 ENCOUNTER — Encounter: Payer: Self-pay | Admitting: Hematology and Oncology

## 2015-10-07 DIAGNOSIS — I10 Essential (primary) hypertension: Secondary | ICD-10-CM

## 2015-10-07 DIAGNOSIS — C50411 Malignant neoplasm of upper-outer quadrant of right female breast: Secondary | ICD-10-CM | POA: Diagnosis not present

## 2015-10-07 DIAGNOSIS — C787 Secondary malignant neoplasm of liver and intrahepatic bile duct: Secondary | ICD-10-CM | POA: Diagnosis not present

## 2015-10-07 DIAGNOSIS — Z5112 Encounter for antineoplastic immunotherapy: Secondary | ICD-10-CM

## 2015-10-07 DIAGNOSIS — C7951 Secondary malignant neoplasm of bone: Secondary | ICD-10-CM

## 2015-10-07 DIAGNOSIS — C778 Secondary and unspecified malignant neoplasm of lymph nodes of multiple regions: Secondary | ICD-10-CM

## 2015-10-07 DIAGNOSIS — C7800 Secondary malignant neoplasm of unspecified lung: Secondary | ICD-10-CM | POA: Diagnosis not present

## 2015-10-07 DIAGNOSIS — Z171 Estrogen receptor negative status [ER-]: Secondary | ICD-10-CM

## 2015-10-07 DIAGNOSIS — G620 Drug-induced polyneuropathy: Secondary | ICD-10-CM

## 2015-10-07 LAB — CBC WITH DIFFERENTIAL/PLATELET
BASO%: 0.3 % (ref 0.0–2.0)
BASOS ABS: 0 10*3/uL (ref 0.0–0.1)
EOS%: 1.1 % (ref 0.0–7.0)
Eosinophils Absolute: 0.1 10*3/uL (ref 0.0–0.5)
HEMATOCRIT: 45.3 % (ref 34.8–46.6)
HGB: 14.7 g/dL (ref 11.6–15.9)
LYMPH#: 1.8 10*3/uL (ref 0.9–3.3)
LYMPH%: 24.1 % (ref 14.0–49.7)
MCH: 29.8 pg (ref 25.1–34.0)
MCHC: 32.5 g/dL (ref 31.5–36.0)
MCV: 91.7 fL (ref 79.5–101.0)
MONO#: 0.4 10*3/uL (ref 0.1–0.9)
MONO%: 5.4 % (ref 0.0–14.0)
NEUT#: 5.1 10*3/uL (ref 1.5–6.5)
NEUT%: 69.1 % (ref 38.4–76.8)
PLATELETS: 205 10*3/uL (ref 145–400)
RBC: 4.94 10*6/uL (ref 3.70–5.45)
RDW: 15.2 % — ABNORMAL HIGH (ref 11.2–14.5)
WBC: 7.4 10*3/uL (ref 3.9–10.3)

## 2015-10-07 LAB — COMPREHENSIVE METABOLIC PANEL
ALT: 14 U/L (ref 0–55)
ANION GAP: 10 meq/L (ref 3–11)
AST: 13 U/L (ref 5–34)
Albumin: 3.3 g/dL — ABNORMAL LOW (ref 3.5–5.0)
Alkaline Phosphatase: 86 U/L (ref 40–150)
BUN: 13.7 mg/dL (ref 7.0–26.0)
CALCIUM: 9.1 mg/dL (ref 8.4–10.4)
CHLORIDE: 111 meq/L — AB (ref 98–109)
CO2: 22 mEq/L (ref 22–29)
Creatinine: 0.8 mg/dL (ref 0.6–1.1)
EGFR: 78 mL/min/{1.73_m2} — ABNORMAL LOW (ref >90)
Glucose: 137 mg/dl (ref 70–140)
POTASSIUM: 4.7 meq/L (ref 3.5–5.1)
Sodium: 142 mEq/L (ref 136–145)
Total Bilirubin: 0.43 mg/dL (ref 0.20–1.20)
Total Protein: 6.9 g/dL (ref 6.4–8.3)

## 2015-10-07 MED ORDER — SODIUM CHLORIDE 0.9 % IJ SOLN
10.0000 mL | INTRAMUSCULAR | Status: DC | PRN
  Administered 2015-10-07: 10 mL
  Filled 2015-10-07: qty 10

## 2015-10-07 MED ORDER — DIPHENHYDRAMINE HCL 25 MG PO CAPS
25.0000 mg | ORAL_CAPSULE | Freq: Once | ORAL | Status: AC
  Administered 2015-10-07: 25 mg via ORAL

## 2015-10-07 MED ORDER — ACETAMINOPHEN 325 MG PO TABS
650.0000 mg | ORAL_TABLET | Freq: Once | ORAL | Status: AC
  Administered 2015-10-07: 650 mg via ORAL

## 2015-10-07 MED ORDER — SODIUM CHLORIDE 0.9 % IV SOLN
6.0000 mg/kg | Freq: Once | INTRAVENOUS | Status: AC
  Administered 2015-10-07: 630 mg via INTRAVENOUS
  Filled 2015-10-07: qty 30

## 2015-10-07 MED ORDER — SODIUM CHLORIDE 0.9 % IV SOLN
Freq: Once | INTRAVENOUS | Status: AC
  Administered 2015-10-07: 09:00:00 via INTRAVENOUS

## 2015-10-07 MED ORDER — SODIUM CHLORIDE 0.9 % IV SOLN
420.0000 mg | Freq: Once | INTRAVENOUS | Status: AC
  Administered 2015-10-07: 420 mg via INTRAVENOUS
  Filled 2015-10-07: qty 14

## 2015-10-07 MED ORDER — DIPHENHYDRAMINE HCL 25 MG PO CAPS
ORAL_CAPSULE | ORAL | Status: AC
  Filled 2015-10-07: qty 1

## 2015-10-07 MED ORDER — HEPARIN SOD (PORK) LOCK FLUSH 100 UNIT/ML IV SOLN
500.0000 [IU] | Freq: Once | INTRAVENOUS | Status: AC | PRN
  Administered 2015-10-07: 500 [IU]
  Filled 2015-10-07: qty 5

## 2015-10-07 MED ORDER — ACETAMINOPHEN 325 MG PO TABS
ORAL_TABLET | ORAL | Status: AC
  Filled 2015-10-07: qty 2

## 2015-10-07 NOTE — Patient Instructions (Signed)
[facility] Discharge Instructions for Patients Receiving Chemotherapy  Today you received the following chemotherapy agents perjeta and herceptin  To help prevent nausea and vomiting after your treatment, we encourage you to take your nausea medication as directed.    If you develop nausea and vomiting that is not controlled by your nausea medication, call the clinic.   BELOW ARE SYMPTOMS THAT SHOULD BE REPORTED IMMEDIATELY:  *FEVER GREATER THAN 100.5 F  *CHILLS WITH OR WITHOUT FEVER  NAUSEA AND VOMITING THAT IS NOT CONTROLLED WITH YOUR NAUSEA MEDICATION  *UNUSUAL SHORTNESS OF BREATH  *UNUSUAL BRUISING OR BLEEDING  TENDERNESS IN MOUTH AND THROAT WITH OR WITHOUT PRESENCE OF ULCERS  *URINARY PROBLEMS  *BOWEL PROBLEMS  UNUSUAL RASH Items with * indicate a potential emergency and should be followed up as soon as possible.  Feel free to call the clinic you have any questions or concerns. The clinic phone number is [phone].  Please show the [place] at check-in to the Emergency Department and triage nurse.

## 2015-10-07 NOTE — Progress Notes (Signed)
Patient does not want to stay for the 30 minutes post observation after Perjeta.

## 2015-10-07 NOTE — Progress Notes (Signed)
Per Eulas Post RN per Dr. Lindi Adie okay to treat with Echo from 06/29/15.

## 2015-10-07 NOTE — Progress Notes (Signed)
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 In addition to Right breast, hypermetabolic disease in Rt Supraclav LN, axilla and mediastinum, bulky disease in liver, Pulm Mets, Path fracture 9th rib      09/03/2014 - 11/26/2014 Chemotherapy    Herceptin Perjeta started 09/03/2014, Taxol weekly added 09/10/2014      10/17/2014 Imaging    CT scans: Decrease in right axillary lymph node, decreased skin thickening of the right breast, decreased in bilateral pulmonary nodules, decrease in liver metastases      12/09/2014 PET scan    Remarkable response to chemotherapy resolution of liver metastases, marked improvement in the tumor in the breast, marked improvement in right axillary lymph nodes, resolution of lung nodules, improvement in left sixth rib      12/17/2014 -  Chemotherapy    Herceptin and Perjeta maintenance every 3 weeks       CHIEF COMPLIANT: Follow-up to review CT scans, Herceptin Perjeta maintenance  INTERVAL HISTORY: Denise Morton is a 65 year old with metastatic breast cancer on Herceptin and Perjeta maintenance. She underwent recent CT chest or pelvis and is here to discuss the results. She's been feeling fairly well. Does have occasional diarrhea. Denies any nausea vomiting.  REVIEW OF SYSTEMS:   Constitutional: Denies fevers, chills or abnormal weight loss Eyes: Denies blurriness of vision Ears, nose, mouth, throat, and face: Denies mucositis or sore throat Respiratory: Denies cough, dyspnea or wheezes Cardiovascular: Denies palpitation, chest  discomfort Gastrointestinal:  Denies nausea, heartburn or change in bowel habits Skin: Denies abnormal skin rashes Lymphatics: Denies new lymphadenopathy or easy bruising Neurological:Denies numbness, tingling or new weaknesses Behavioral/Psych: Mood is stable, no new changes  Extremities: No lower extremity edema Breast:  Intermittent right breast discomfort All other systems were reviewed with the patient and are negative.  I have reviewed the past medical history, past surgical history, social history and family history with the patient and they are unchanged from previous note.  ALLERGIES:  is allergic to penicillin g and sulfa antibiotics.  MEDICATIONS:  Current Outpatient Prescriptions  Medication Sig Dispense Refill  . ALPRAZolam (XANAX) 0.25 MG tablet Take 0.25 mg by mouth daily as needed for anxiety.    . carvedilol (COREG) 12.5 MG tablet Take 1 tablet (12.5 mg total) by mouth 2 (two) times daily with a meal. 60 tablet 0  . gabapentin (NEURONTIN) 300 MG capsule Take 1 capsule (300 mg total) by mouth 3 (three) times daily. 90 capsule 6  . lidocaine-prilocaine (EMLA) cream Apply to port 1-2 hours before procedure 30 g 1  . loperamide (IMODIUM) 2 MG capsule Take 2 mg by mouth as needed for diarrhea or loose stools.    Marland Kitchen losartan-hydrochlorothiazide (HYZAAR) 50-12.5 MG tablet Take 1 tablet by mouth daily.    . metFORMIN (GLUMETZA) 500 MG (MOD) 24 hr tablet Take 1 tablet (500 mg total) by mouth daily with breakfast.    . Multiple Vitamin (MULTIVITAMIN WITH MINERALS) TABS tablet Take 1 tablet by mouth daily.    . nicotine polacrilex (COMMIT) 2 MG lozenge Take 2  mg by mouth as needed for smoking cessation.    . ondansetron (ZOFRAN) 8 MG tablet TAKE ONE TABLET BY MOUTH TWICE DAILY. START THE DAY AFTER CHEMO FOR TWO DAYS. THEN TAKE AS NEEDED FOR NAUSEA OR FOR VOMITING  1  . prochlorperazine (COMPAZINE) 10 MG tablet TAKE ONE TABLET BY MOUTH EVERY 6 HOURS AS NEEDED FOR NAUSEA OR FOR VOMITING   1   No current facility-administered medications for this visit.    Facility-Administered Medications Ordered in Other Visits  Medication Dose Route Frequency Provider Last Rate Last Dose  . sodium chloride 0.9 % injection 10 mL  10 mL Intracatheter PRN Nicholas Lose, MD   10 mL at 02/18/15 1111    PHYSICAL EXAMINATION: ECOG PERFORMANCE STATUS: 1 - Symptomatic but completely ambulatory  Vitals:   10/07/15 0816 10/07/15 0820  BP: (!) 175/102 (!) 134/91  Pulse: 69   Resp: 19   Temp: 98 F (36.7 C)    Filed Weights   10/07/15 0816  Weight: 225 lb 4.8 oz (102.2 kg)    GENERAL:alert, no distress and comfortable SKIN: skin color, texture, turgor are normal, no rashes or significant lesions EYES: normal, Conjunctiva are pink and non-injected, sclera clear OROPHARYNX:no exudate, no erythema and lips, buccal mucosa, and tongue normal  NECK: supple, thyroid normal size, non-tender, without nodularity LYMPH:  no palpable lymphadenopathy in the cervical, axillary or inguinal LUNGS: clear to auscultation and percussion with normal breathing effort HEART: regular rate & rhythm and no murmurs and no lower extremity edema ABDOMEN:abdomen soft, non-tender and normal bowel sounds MUSCULOSKELETAL:no cyanosis of digits and no clubbing  NEURO: alert & oriented x 3 with fluent speech, no focal motor/sensory deficits EXTREMITIES: No lower extremity edema BREAST: No palpable masses or nodules in either right or left breasts. No palpable axillary supraclavicular or infraclavicular adenopathy no breast tenderness or nipple discharge. (exam performed in the presence of a chaperone)  LABORATORY DATA:  I have reviewed the data as listed   Chemistry      Component Value Date/Time   NA 141 09/16/2015 0823   K 4.1 09/16/2015 0823   CL 108 12/03/2006 1854   CO2 23 09/16/2015 0823   BUN 29.5 (H) 09/16/2015 0823   CREATININE 0.8 09/16/2015 0823      Component Value Date/Time   CALCIUM 9.6 09/16/2015  0823   ALKPHOS 73 09/16/2015 0823   AST 12 09/16/2015 0823   ALT 13 09/16/2015 0823   BILITOT 0.44 09/16/2015 0823       Lab Results  Component Value Date   WBC 7.4 10/07/2015   HGB 14.7 10/07/2015   HCT 45.3 10/07/2015   MCV 91.7 10/07/2015   PLT 205 10/07/2015   NEUTROABS 5.1 10/07/2015     ASSESSMENT & PLAN:  Breast cancer of 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 In addition to Right breast, hypermetabolic disease in Rt Supraclav LN, axilla and mediastinum, bulky disease in liver, Pulm Mets, Path fracture 9th rib   Treatment summary:Taxol weekly(started 09/10/2014 )Herceptin Perjeta every 3 weeks(started 09/03/2014) Received 10 Weeks Taxol, Herceptin Perjeta given once every 3 weeks (8/17 and 9/7 and 9/28, 10/19) ; changed chemo from Taxol to Abraxane for bronchospasm 11/19/14 and completed 12/03/14   PET CT scan 12/09/2014: Complete response in the liver, marked improvement in  metastatic disease in the right breast and right axilla, improvement in bone metastases, decrease in lymphadenopathy throughout. Resolution of lung nodules. CT CAP 03/09/15: No new lung mets, stable ovoid nodule in post lateral right breast, No evidence of met disease in abd, low density lesions in liver are unchanged. CT CAP 06/01/2015:no evidence of metastatic disease in the liver with continued complete response to treatment. --------------------------------------------------------------------------------------------------------------------------------------------------------------------------- Current Treatment: Herceptin Perjeta maintenance started 12/17/14 Chemotherapy toxicities: 1. 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 I increased the Neurontin to 300 by mouth 3 times a day on 08/05/2015  Plan: Continue Herceptin Perjeta every 3 weeks Patient last had a mammogram and colonoscopy with her primary care physician. She gets her mammograms at [facility]. Hypertension: Being managed by Dr. Delfina Redwood. Much improved  CT chest abdomen pelvis 10/06/2015: No evidence of metastatic disease  Return to clinic every 3 weeks for Herceptin and Perjeta and in 9 weeks for clinic follow-up. Labs every 9 weeks for follow-up   Orders Placed This Encounter  Procedures  . ECHOCARDIOGRAM LIMITED    Standing Status:   Future    Standing Expiration Date:   01/05/2017    Order Specific Question:   Where should this test be performed    Answer:   [facility]    Order Specific Question:   Complete or Limited study?    Answer:   Limited    Order Specific Question:   Does the patient have a known history of hypersensitivity to Perflutren (Chief Technology Officer for echocardiograms - CHECK ALLERGIES)    Answer:   No    Order Specific Question:   ADMINISTER PERFLUTERN    Answer:   ADMINISTER PERFLUTREN    Order Specific Question:   Expected Date:    Answer:   1 month    Order Specific Question:   Reason for exam-Echo    Answer:   Chemo  V67.2 / Z09   The patient has a good understanding of the overall plan. she agrees with it. she will call with any problems that may develop before the next visit here.   Rulon Eisenmenger, MD 10/07/15

## 2015-10-07 NOTE — Assessment & Plan Note (Addendum)
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 In addition to Right breast, hypermetabolic disease in Rt Supraclav LN, axilla and mediastinum, bulky disease in liver, Pulm Mets, Path fracture 9th rib   Treatment summary:Taxol weekly(started 09/10/2014 )Herceptin Perjeta every 3 weeks(started 09/03/2014) Received 10 Weeks Taxol, Herceptin Perjeta given once every 3 weeks (8/17 and 9/7 and 9/28, 10/19) ; changed chemo from Taxol to Abraxane for bronchospasm 11/19/14 and completed 12/03/14   PET CT scan 12/09/2014: Complete response in the liver, marked improvement in metastatic disease in the right breast and right axilla, improvement in bone metastases, decrease in lymphadenopathy throughout. Resolution of lung nodules. CT CAP 03/09/15: No new lung mets, stable ovoid nodule in post lateral right breast, No evidence of met disease in abd, low density lesions in liver are unchanged. CT CAP 06/01/2015:no evidence of metastatic disease in the liver with continued complete response to treatment. --------------------------------------------------------------------------------------------------------------------------------------------------------------------------- Current Treatment: Herceptin Perjeta maintenance started 12/17/14 Chemotherapy toxicities: 1. 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 I increased the Neurontin to 300 by mouth 3 times a day on 08/05/2015  Plan: Continue Herceptin Perjeta every 3 weeks  Hypertension: Being managed by Dr. Delfina Redwood. Much improved  CT chest abdomen pelvis 10/06/2015: No evidence of metastatic disease  Return to clinic every 3 weeks for Herceptin and Perjeta and in 9 weeks for clinic follow-up. Labs every 9 weeks for follow-up

## 2015-10-28 ENCOUNTER — Ambulatory Visit (HOSPITAL_BASED_OUTPATIENT_CLINIC_OR_DEPARTMENT_OTHER): Payer: PPO

## 2015-10-28 ENCOUNTER — Other Ambulatory Visit (HOSPITAL_BASED_OUTPATIENT_CLINIC_OR_DEPARTMENT_OTHER): Payer: PPO

## 2015-10-28 VITALS — BP 166/78 | HR 69 | Temp 98.1°F | Resp 18

## 2015-10-28 DIAGNOSIS — C50411 Malignant neoplasm of upper-outer quadrant of right female breast: Secondary | ICD-10-CM

## 2015-10-28 DIAGNOSIS — Z23 Encounter for immunization: Secondary | ICD-10-CM

## 2015-10-28 DIAGNOSIS — Z5112 Encounter for antineoplastic immunotherapy: Secondary | ICD-10-CM

## 2015-10-28 LAB — COMPREHENSIVE METABOLIC PANEL
ALBUMIN: 3.3 g/dL — AB (ref 3.5–5.0)
ALK PHOS: 83 U/L (ref 40–150)
ALT: 10 U/L (ref 0–55)
AST: 11 U/L (ref 5–34)
Anion Gap: 11 mEq/L (ref 3–11)
BILIRUBIN TOTAL: 0.58 mg/dL (ref 0.20–1.20)
BUN: 17.6 mg/dL (ref 7.0–26.0)
CO2: 23 mEq/L (ref 22–29)
CREATININE: 0.8 mg/dL (ref 0.6–1.1)
Calcium: 9.6 mg/dL (ref 8.4–10.4)
Chloride: 109 mEq/L (ref 98–109)
EGFR: 80 mL/min/{1.73_m2} — AB (ref >90)
GLUCOSE: 134 mg/dL (ref 70–140)
Potassium: 4.1 mEq/L (ref 3.5–5.1)
SODIUM: 143 meq/L (ref 136–145)
TOTAL PROTEIN: 6.9 g/dL (ref 6.4–8.3)

## 2015-10-28 LAB — CBC WITH DIFFERENTIAL/PLATELET
BASO%: 0.5 % (ref 0.0–2.0)
Basophils Absolute: 0 10*3/uL (ref 0.0–0.1)
EOS ABS: 0.1 10*3/uL (ref 0.0–0.5)
EOS%: 1.4 % (ref 0.0–7.0)
HCT: 46 % (ref 34.8–46.6)
HEMOGLOBIN: 15.2 g/dL (ref 11.6–15.9)
LYMPH%: 34.3 % (ref 14.0–49.7)
MCH: 30.3 pg (ref 25.1–34.0)
MCHC: 33 g/dL (ref 31.5–36.0)
MCV: 91.6 fL (ref 79.5–101.0)
MONO#: 0.4 10*3/uL (ref 0.1–0.9)
MONO%: 5.5 % (ref 0.0–14.0)
NEUT%: 58.3 % (ref 38.4–76.8)
NEUTROS ABS: 3.8 10*3/uL (ref 1.5–6.5)
Platelets: 205 10*3/uL (ref 145–400)
RBC: 5.02 10*6/uL (ref 3.70–5.45)
RDW: 14.6 % — AB (ref 11.2–14.5)
WBC: 6.6 10*3/uL (ref 3.9–10.3)
lymph#: 2.3 10*3/uL (ref 0.9–3.3)

## 2015-10-28 MED ORDER — SODIUM CHLORIDE 0.9 % IV SOLN
6.0000 mg/kg | Freq: Once | INTRAVENOUS | Status: AC
  Administered 2015-10-28: 630 mg via INTRAVENOUS
  Filled 2015-10-28: qty 30

## 2015-10-28 MED ORDER — SODIUM CHLORIDE 0.9 % IV SOLN
420.0000 mg | Freq: Once | INTRAVENOUS | Status: AC
  Administered 2015-10-28: 420 mg via INTRAVENOUS
  Filled 2015-10-28: qty 14

## 2015-10-28 MED ORDER — HEPARIN SOD (PORK) LOCK FLUSH 100 UNIT/ML IV SOLN
500.0000 [IU] | Freq: Once | INTRAVENOUS | Status: AC | PRN
  Administered 2015-10-28: 500 [IU]
  Filled 2015-10-28: qty 5

## 2015-10-28 MED ORDER — DIPHENHYDRAMINE HCL 25 MG PO CAPS
ORAL_CAPSULE | ORAL | Status: AC
  Filled 2015-10-28: qty 1

## 2015-10-28 MED ORDER — SODIUM CHLORIDE 0.9 % IJ SOLN
10.0000 mL | INTRAMUSCULAR | Status: DC | PRN
  Administered 2015-10-28: 10 mL
  Filled 2015-10-28: qty 10

## 2015-10-28 MED ORDER — ACETAMINOPHEN 325 MG PO TABS
ORAL_TABLET | ORAL | Status: AC
  Filled 2015-10-28: qty 2

## 2015-10-28 MED ORDER — SODIUM CHLORIDE 0.9 % IV SOLN
Freq: Once | INTRAVENOUS | Status: AC
  Administered 2015-10-28: 09:00:00 via INTRAVENOUS

## 2015-10-28 MED ORDER — ACETAMINOPHEN 325 MG PO TABS
650.0000 mg | ORAL_TABLET | Freq: Once | ORAL | Status: AC
  Administered 2015-10-28: 650 mg via ORAL

## 2015-10-28 MED ORDER — INFLUENZA VAC SPLIT QUAD 0.5 ML IM SUSY
0.5000 mL | PREFILLED_SYRINGE | Freq: Once | INTRAMUSCULAR | Status: AC
  Administered 2015-10-28: 0.5 mL via INTRAMUSCULAR
  Filled 2015-10-28: qty 0.5

## 2015-10-28 MED ORDER — DIPHENHYDRAMINE HCL 25 MG PO CAPS
25.0000 mg | ORAL_CAPSULE | Freq: Once | ORAL | Status: AC
  Administered 2015-10-28: 25 mg via ORAL

## 2015-10-28 NOTE — Progress Notes (Signed)
Pt willing to stay 52min post perjeta for observation. Pt to communicate with cardiologist regarding elevated BP today. Pt denied any light headedness, SOB, or pain upon discharge.

## 2015-10-28 NOTE — Patient Instructions (Addendum)
[facility] Discharge Instructions for Patients Receiving Chemotherapy  Today you received the following chemotherapy agent(s) Herceptin and Perjeta.  To help prevent nausea and vomiting after your treatment, we encourage you to take your nausea medication as directed.    If you develop nausea and vomiting that is not controlled by your nausea medication, call the clinic.   BELOW ARE SYMPTOMS THAT SHOULD BE REPORTED IMMEDIATELY:  *FEVER GREATER THAN 100.5 F  *CHILLS WITH OR WITHOUT FEVER  NAUSEA AND VOMITING THAT IS NOT CONTROLLED WITH YOUR NAUSEA MEDICATION  *UNUSUAL SHORTNESS OF BREATH  *UNUSUAL BRUISING OR BLEEDING  TENDERNESS IN MOUTH AND THROAT WITH OR WITHOUT PRESENCE OF ULCERS  *URINARY PROBLEMS  *BOWEL PROBLEMS  UNUSUAL RASH Items with * indicate a potential emergency and should be followed up as soon as possible.  Feel free to call the clinic you have any questions or concerns. The clinic phone number is [phone].  Please show the [place] at check-in to the Emergency Department and triage nurse.  Influenza Virus Vaccine injection What is this medicine? INFLUENZA VIRUS VACCINE (in floo EN zuh VAHY ruhs vak SEEN) helps to reduce the risk of getting influenza also known as the flu. The vaccine only helps protect you against some strains of the flu. This medicine may be used for other purposes; ask your health care provider or pharmacist if you have questions. What should I tell my health care provider before I take this medicine? They need to know if you have any of these conditions: -bleeding disorder like hemophilia -fever or infection -Guillain-Barre syndrome or other neurological problems -immune system problems -infection with the human immunodeficiency virus (HIV) or AIDS -low blood platelet counts -multiple sclerosis -an unusual or allergic reaction to influenza virus vaccine, latex, other medicines, foods, dyes, or  preservatives. Different brands of vaccines contain different allergens. Some may contain latex or eggs. Talk to your doctor about your allergies to make sure that you get the right vaccine. -pregnant or trying to get pregnant -breast-feeding How should I use this medicine? This vaccine is for injection into a muscle or under the skin. It is given by a health care professional. A copy of Vaccine Information Statements will be given before each vaccination. Read this sheet carefully each time. The sheet may change frequently. Talk to your healthcare provider to see which vaccines are right for you. Some vaccines should not be used in all age groups. Overdosage: If you think you have taken too much of this medicine contact a poison control center or emergency room at once. NOTE: This medicine is only for you. Do not share this medicine with others. What if I miss a dose? This does not apply. What may interact with this medicine? -chemotherapy or radiation therapy -medicines that lower your immune system like etanercept, anakinra, infliximab, and adalimumab -medicines that treat or prevent blood clots like warfarin -phenytoin -steroid medicines like prednisone or cortisone -theophylline -vaccines This list may not describe all possible interactions. Give your health care provider a list of all the medicines, herbs, non-prescription drugs, or dietary supplements you use. Also tell them if you smoke, drink alcohol, or use illegal drugs. Some items may interact with your medicine. What should I watch for while using this medicine? Report any side effects that do not go away within 3 days to your doctor or health care professional. Call your health care provider if any unusual symptoms occur within 6 weeks of receiving this vaccine. You  may still catch the flu, but the illness is not usually as bad. You cannot get the flu from the vaccine. The vaccine will not protect against colds or other illnesses  that may cause fever. The vaccine is needed every year. What side effects may I notice from receiving this medicine? Side effects that you should report to your doctor or health care professional as soon as possible: -allergic reactions like skin rash, itching or hives, swelling of the face, lips, or tongue Side effects that usually do not require medical attention (report to your doctor or health care professional if they continue or are bothersome): -fever -headache -muscle aches and pains -pain, tenderness, redness, or swelling at the injection site -tiredness This list may not describe all possible side effects. Call your doctor for medical advice about side effects. You may report side effects to FDA at 1-800-FDA-1088. Where should I keep my medicine? The vaccine will be given by a health care professional in a clinic, pharmacy, doctor's office, or other health care setting. You will not be given vaccine doses to store at home. NOTE: This sheet is a summary. It may not cover all possible information. If you have questions about this medicine, talk to your doctor, pharmacist, or health care provider.    2016, Elsevier/Gold Standard. (2014-07-25 10:07:28)

## 2015-11-03 ENCOUNTER — Ambulatory Visit (HOSPITAL_COMMUNITY)
Admission: RE | Admit: 2015-11-03 | Discharge: 2015-11-03 | Disposition: A | Discharge status: Home / Self Care | Payer: PPO | Source: Ambulatory Visit | Attending: Hematology and Oncology | Admitting: Hematology and Oncology

## 2015-11-03 DIAGNOSIS — I501 Left ventricular failure: Secondary | ICD-10-CM | POA: Insufficient documentation

## 2015-11-03 DIAGNOSIS — C50411 Malignant neoplasm of upper-outer quadrant of right female breast: Secondary | ICD-10-CM | POA: Diagnosis not present

## 2015-11-03 NOTE — Progress Notes (Signed)
  Echocardiogram 2D Echocardiogram limited has been performed.  Tresa Res 11/03/2015, 10:23 AM

## 2015-11-18 ENCOUNTER — Ambulatory Visit (HOSPITAL_BASED_OUTPATIENT_CLINIC_OR_DEPARTMENT_OTHER): Payer: PPO

## 2015-11-18 ENCOUNTER — Other Ambulatory Visit (HOSPITAL_BASED_OUTPATIENT_CLINIC_OR_DEPARTMENT_OTHER): Payer: PPO

## 2015-11-18 VITALS — BP 189/92 | HR 61 | Temp 98.5°F | Resp 17

## 2015-11-18 DIAGNOSIS — Z5112 Encounter for antineoplastic immunotherapy: Secondary | ICD-10-CM

## 2015-11-18 DIAGNOSIS — C50411 Malignant neoplasm of upper-outer quadrant of right female breast: Secondary | ICD-10-CM

## 2015-11-18 DIAGNOSIS — I10 Essential (primary) hypertension: Secondary | ICD-10-CM

## 2015-11-18 LAB — COMPREHENSIVE METABOLIC PANEL
ALBUMIN: 3.3 g/dL — AB (ref 3.5–5.0)
ALK PHOS: 91 U/L (ref 40–150)
ALT: 15 U/L (ref 0–55)
ANION GAP: 8 meq/L (ref 3–11)
AST: 14 U/L (ref 5–34)
BILIRUBIN TOTAL: 0.41 mg/dL (ref 0.20–1.20)
BUN: 15.6 mg/dL (ref 7.0–26.0)
CALCIUM: 9.4 mg/dL (ref 8.4–10.4)
CO2: 25 mEq/L (ref 22–29)
CREATININE: 0.7 mg/dL (ref 0.6–1.1)
Chloride: 108 mEq/L (ref 98–109)
EGFR: 85 mL/min/{1.73_m2} — AB (ref >90)
Glucose: 134 mg/dl (ref 70–140)
Potassium: 4.1 mEq/L (ref 3.5–5.1)
Sodium: 141 mEq/L (ref 136–145)
TOTAL PROTEIN: 7.2 g/dL (ref 6.4–8.3)

## 2015-11-18 LAB — CBC WITH DIFFERENTIAL/PLATELET
BASO%: 0.7 % (ref 0.0–2.0)
Basophils Absolute: 0.1 10*3/uL (ref 0.0–0.1)
EOS ABS: 0.1 10*3/uL (ref 0.0–0.5)
EOS%: 1.2 % (ref 0.0–7.0)
HEMATOCRIT: 47.7 % — AB (ref 34.8–46.6)
HEMOGLOBIN: 15.5 g/dL (ref 11.6–15.9)
LYMPH#: 2.4 10*3/uL (ref 0.9–3.3)
LYMPH%: 32.4 % (ref 14.0–49.7)
MCH: 29.4 pg (ref 25.1–34.0)
MCHC: 32.5 g/dL (ref 31.5–36.0)
MCV: 90.5 fL (ref 79.5–101.0)
MONO#: 0.4 10*3/uL (ref 0.1–0.9)
MONO%: 5.4 % (ref 0.0–14.0)
NEUT%: 60.3 % (ref 38.4–76.8)
NEUTROS ABS: 4.5 10*3/uL (ref 1.5–6.5)
PLATELETS: 218 10*3/uL (ref 145–400)
RBC: 5.27 10*6/uL (ref 3.70–5.45)
RDW: 14.8 % — ABNORMAL HIGH (ref 11.2–14.5)
WBC: 7.5 10*3/uL (ref 3.9–10.3)

## 2015-11-18 MED ORDER — SODIUM CHLORIDE 0.9 % IV SOLN
420.0000 mg | Freq: Once | INTRAVENOUS | Status: AC
  Administered 2015-11-18: 420 mg via INTRAVENOUS
  Filled 2015-11-18: qty 14

## 2015-11-18 MED ORDER — SODIUM CHLORIDE 0.9 % IV SOLN
Freq: Once | INTRAVENOUS | Status: AC
  Administered 2015-11-18: 10:00:00 via INTRAVENOUS

## 2015-11-18 MED ORDER — HEPARIN SOD (PORK) LOCK FLUSH 100 UNIT/ML IV SOLN
500.0000 [IU] | Freq: Once | INTRAVENOUS | Status: AC | PRN
  Administered 2015-11-18: 500 [IU]
  Filled 2015-11-18: qty 5

## 2015-11-18 MED ORDER — TRASTUZUMAB CHEMO 150 MG IV SOLR
6.0000 mg/kg | Freq: Once | INTRAVENOUS | Status: AC
  Administered 2015-11-18: 630 mg via INTRAVENOUS
  Filled 2015-11-18: qty 30

## 2015-11-18 MED ORDER — CLONIDINE HCL 0.1 MG PO TABS
0.1000 mg | ORAL_TABLET | Freq: Once | ORAL | Status: AC
  Administered 2015-11-18: 0.1 mg via ORAL

## 2015-11-18 MED ORDER — DIPHENHYDRAMINE HCL 25 MG PO CAPS
ORAL_CAPSULE | ORAL | Status: AC
  Filled 2015-11-18: qty 1

## 2015-11-18 MED ORDER — SODIUM CHLORIDE 0.9 % IJ SOLN
10.0000 mL | INTRAMUSCULAR | Status: DC | PRN
  Administered 2015-11-18: 10 mL
  Filled 2015-11-18: qty 10

## 2015-11-18 MED ORDER — ACETAMINOPHEN 325 MG PO TABS
650.0000 mg | ORAL_TABLET | Freq: Once | ORAL | Status: AC
  Administered 2015-11-18: 650 mg via ORAL

## 2015-11-18 MED ORDER — ACETAMINOPHEN 325 MG PO TABS
ORAL_TABLET | ORAL | Status: AC
  Filled 2015-11-18: qty 2

## 2015-11-18 MED ORDER — DIPHENHYDRAMINE HCL 25 MG PO CAPS
25.0000 mg | ORAL_CAPSULE | Freq: Once | ORAL | Status: AC
  Administered 2015-11-18: 25 mg via ORAL

## 2015-11-18 MED ORDER — CLONIDINE HCL 0.1 MG PO TABS
ORAL_TABLET | ORAL | Status: AC
  Filled 2015-11-18: qty 1

## 2015-11-18 NOTE — Progress Notes (Signed)
Pt BP 210/90 recheck was 214/89.  Pt stated she was out of blood pressure medication for the last two days, but is planning on picking up BP meds today.  Pt states BP is normal WNL with BP med, and has the ability to check BP at home.  Pt denies chest pain, lightheadness, dizziness, headache, no other complaints.  Pt advised to take BP immediately when leaving cancer center, and return to ED if BP does not normalize.  Pt verbalized understanding.  Dr. Lindi Adie notified.

## 2015-11-18 NOTE — Patient Instructions (Signed)
[facility] Discharge Instructions for Patients Receiving Chemotherapy  Today you received the following chemotherapy agents herceptin/perjeta  To help prevent nausea and vomiting after your treatment, we encourage you to take your nausea medication as directed   If you develop nausea and vomiting that is not controlled by your nausea medication, call the clinic.   BELOW ARE SYMPTOMS THAT SHOULD BE REPORTED IMMEDIATELY:  *FEVER GREATER THAN 100.5 F  *CHILLS WITH OR WITHOUT FEVER  NAUSEA AND VOMITING THAT IS NOT CONTROLLED WITH YOUR NAUSEA MEDICATION  *UNUSUAL SHORTNESS OF BREATH  *UNUSUAL BRUISING OR BLEEDING  TENDERNESS IN MOUTH AND THROAT WITH OR WITHOUT PRESENCE OF ULCERS  *URINARY PROBLEMS  *BOWEL PROBLEMS  UNUSUAL RASH Items with * indicate a potential emergency and should be followed up as soon as possible.  Feel free to call the clinic you have any questions or concerns. The clinic phone number is [phone].  

## 2015-12-07 NOTE — Assessment & Plan Note (Signed)
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 In addition to Right breast, hypermetabolic disease in Rt Supraclav LN, axilla and mediastinum, bulky disease in liver, Pulm Mets, Path fracture 9th rib   Treatment summary:Taxol weekly(started 09/10/2014 )Herceptin Perjeta every 3 weeks(started 09/03/2014) Received 10 Weeks Taxol, Herceptin Perjeta given once every 3 weeks (8/17 and 9/7 and 9/28, 10/19) ; changed chemo from Taxol to Abraxane for bronchospasm 11/19/14 and completed 12/03/14   PET CT scan 12/09/2014: Complete response in the liver, marked improvement in metastatic disease in the right breast and right axilla, improvement in bone metastases, decrease in lymphadenopathy throughout. Resolution of lung nodules. CT CAP 03/09/15: No new lung mets, stable ovoid nodule in post lateral right breast, No evidence of met disease in abd, low density lesions in liver are unchanged. CT CAP 06/01/2015:no evidence of metastatic disease in the liver with continued complete response to treatment. --------------------------------------------------------------------------------------------------------------------------------------------------------------------------- Current Treatment: Herceptin Perjeta maintenance started 12/17/14 Chemotherapy toxicities: 1. 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 I increased the Neurontin to 300 by mouth 3 times a day on 08/05/2015  Plan: Continue Herceptin Perjeta every 3 weeks Patient last had a mammogram and colonoscopy with her primary care physician. She gets her mammograms at [facility]. Hypertension: Being managed by Dr. Delfina Redwood. Much improved  CT chest abdomen pelvis 10/06/2015: No evidence of metastatic disease  Return to clinic  every 3 weeks for Herceptin and Perjeta and in 9 weeks for clinic follow-up. Labs every 9 weeks for follow-up

## 2015-12-09 ENCOUNTER — Encounter: Payer: Self-pay | Admitting: Hematology and Oncology

## 2015-12-09 ENCOUNTER — Ambulatory Visit (HOSPITAL_BASED_OUTPATIENT_CLINIC_OR_DEPARTMENT_OTHER): Payer: PPO

## 2015-12-09 ENCOUNTER — Other Ambulatory Visit (HOSPITAL_BASED_OUTPATIENT_CLINIC_OR_DEPARTMENT_OTHER): Payer: PPO

## 2015-12-09 ENCOUNTER — Ambulatory Visit (HOSPITAL_BASED_OUTPATIENT_CLINIC_OR_DEPARTMENT_OTHER): Payer: PPO | Admitting: Hematology and Oncology

## 2015-12-09 VITALS — BP 147/77 | HR 63 | Temp 98.0°F | Resp 17 | Ht 65.0 in | Wt 222.1 lb

## 2015-12-09 DIAGNOSIS — T451X5A Adverse effect of antineoplastic and immunosuppressive drugs, initial encounter: Secondary | ICD-10-CM

## 2015-12-09 DIAGNOSIS — C787 Secondary malignant neoplasm of liver and intrahepatic bile duct: Secondary | ICD-10-CM

## 2015-12-09 DIAGNOSIS — C7801 Secondary malignant neoplasm of right lung: Secondary | ICD-10-CM

## 2015-12-09 DIAGNOSIS — C50411 Malignant neoplasm of upper-outer quadrant of right female breast: Secondary | ICD-10-CM

## 2015-12-09 DIAGNOSIS — Z5112 Encounter for antineoplastic immunotherapy: Secondary | ICD-10-CM

## 2015-12-09 DIAGNOSIS — C7951 Secondary malignant neoplasm of bone: Secondary | ICD-10-CM

## 2015-12-09 DIAGNOSIS — G620 Drug-induced polyneuropathy: Secondary | ICD-10-CM

## 2015-12-09 DIAGNOSIS — I10 Essential (primary) hypertension: Secondary | ICD-10-CM

## 2015-12-09 DIAGNOSIS — C7802 Secondary malignant neoplasm of left lung: Secondary | ICD-10-CM

## 2015-12-09 DIAGNOSIS — C799 Secondary malignant neoplasm of unspecified site: Secondary | ICD-10-CM

## 2015-12-09 LAB — COMPREHENSIVE METABOLIC PANEL
ALBUMIN: 3.3 g/dL — AB (ref 3.5–5.0)
ALK PHOS: 91 U/L (ref 40–150)
ALT: 14 U/L (ref 0–55)
ANION GAP: 10 meq/L (ref 3–11)
AST: 13 U/L (ref 5–34)
BILIRUBIN TOTAL: 0.52 mg/dL (ref 0.20–1.20)
BUN: 16.5 mg/dL (ref 7.0–26.0)
CO2: 22 meq/L (ref 22–29)
CREATININE: 0.8 mg/dL (ref 0.6–1.1)
Calcium: 9.5 mg/dL (ref 8.4–10.4)
Chloride: 108 mEq/L (ref 98–109)
EGFR: 79 mL/min/{1.73_m2} — AB (ref >90)
GLUCOSE: 140 mg/dL (ref 70–140)
Potassium: 4.2 mEq/L (ref 3.5–5.1)
SODIUM: 140 meq/L (ref 136–145)
TOTAL PROTEIN: 7 g/dL (ref 6.4–8.3)

## 2015-12-09 LAB — CBC WITH DIFFERENTIAL/PLATELET
BASO%: 0.3 % (ref 0.0–2.0)
Basophils Absolute: 0 10*3/uL (ref 0.0–0.1)
EOS ABS: 0 10*3/uL (ref 0.0–0.5)
EOS%: 0.6 % (ref 0.0–7.0)
HCT: 46 % (ref 34.8–46.6)
HEMOGLOBIN: 15.1 g/dL (ref 11.6–15.9)
LYMPH#: 2 10*3/uL (ref 0.9–3.3)
LYMPH%: 28.9 % (ref 14.0–49.7)
MCH: 30 pg (ref 25.1–34.0)
MCHC: 32.8 g/dL (ref 31.5–36.0)
MCV: 91.3 fL (ref 79.5–101.0)
MONO#: 0.3 10*3/uL (ref 0.1–0.9)
MONO%: 4.7 % (ref 0.0–14.0)
NEUT%: 65.5 % (ref 38.4–76.8)
NEUTROS ABS: 4.4 10*3/uL (ref 1.5–6.5)
PLATELETS: 207 10*3/uL (ref 145–400)
RBC: 5.04 10*6/uL (ref 3.70–5.45)
RDW: 14.3 % (ref 11.2–14.5)
WBC: 6.8 10*3/uL (ref 3.9–10.3)

## 2015-12-09 MED ORDER — HEPARIN SOD (PORK) LOCK FLUSH 100 UNIT/ML IV SOLN
500.0000 [IU] | Freq: Once | INTRAVENOUS | Status: AC | PRN
  Administered 2015-12-09: 500 [IU]
  Filled 2015-12-09: qty 5

## 2015-12-09 MED ORDER — PERTUZUMAB CHEMO INJECTION 420 MG/14ML
420.0000 mg | Freq: Once | INTRAVENOUS | Status: AC
  Administered 2015-12-09: 420 mg via INTRAVENOUS
  Filled 2015-12-09: qty 14

## 2015-12-09 MED ORDER — DIPHENHYDRAMINE HCL 25 MG PO CAPS
ORAL_CAPSULE | ORAL | Status: AC
  Filled 2015-12-09: qty 1

## 2015-12-09 MED ORDER — ACETAMINOPHEN 325 MG PO TABS
ORAL_TABLET | ORAL | Status: AC
  Filled 2015-12-09: qty 2

## 2015-12-09 MED ORDER — MICONAZOLE NITRATE 2 % EX CREA: 1.0000 "application " | TOPICAL_CREAM | Freq: Two times a day (BID) | CUTANEOUS | 0 refills | Status: DC

## 2015-12-09 MED ORDER — ACETAMINOPHEN 325 MG PO TABS
650.0000 mg | ORAL_TABLET | Freq: Once | ORAL | Status: AC
  Administered 2015-12-09: 650 mg via ORAL

## 2015-12-09 MED ORDER — DIPHENHYDRAMINE HCL 25 MG PO CAPS
25.0000 mg | ORAL_CAPSULE | Freq: Once | ORAL | Status: AC
  Administered 2015-12-09: 25 mg via ORAL

## 2015-12-09 MED ORDER — TRASTUZUMAB CHEMO 150 MG IV SOLR
6.0000 mg/kg | Freq: Once | INTRAVENOUS | Status: AC
  Administered 2015-12-09: 630 mg via INTRAVENOUS
  Filled 2015-12-09: qty 30

## 2015-12-09 MED ORDER — SODIUM CHLORIDE 0.9 % IJ SOLN
10.0000 mL | INTRAMUSCULAR | Status: DC | PRN
  Administered 2015-12-09: 10 mL
  Filled 2015-12-09: qty 10

## 2015-12-09 MED ORDER — SODIUM CHLORIDE 0.9 % IV SOLN
Freq: Once | INTRAVENOUS | Status: AC
  Administered 2015-12-09: 10:00:00 via INTRAVENOUS

## 2015-12-09 NOTE — Patient Instructions (Signed)
[facility] Discharge Instructions for Patients Receiving Chemotherapy  Today you received the following chemotherapy agents Herceptin and Perjeta.   To help prevent nausea and vomiting after your treatment, we encourage you to take your nausea medication as directed.    If you develop nausea and vomiting that is not controlled by your nausea medication, call the clinic.   BELOW ARE SYMPTOMS THAT SHOULD BE REPORTED IMMEDIATELY:  *FEVER GREATER THAN 100.5 F  *CHILLS WITH OR WITHOUT FEVER  NAUSEA AND VOMITING THAT IS NOT CONTROLLED WITH YOUR NAUSEA MEDICATION  *UNUSUAL SHORTNESS OF BREATH  *UNUSUAL BRUISING OR BLEEDING  TENDERNESS IN MOUTH AND THROAT WITH OR WITHOUT PRESENCE OF ULCERS  *URINARY PROBLEMS  *BOWEL PROBLEMS  UNUSUAL RASH Items with * indicate a potential emergency and should be followed up as soon as possible.  Feel free to call the clinic you have any questions or concerns. The clinic phone number is [phone].    

## 2015-12-09 NOTE — Progress Notes (Signed)
Patient Care Team: Seward Carol, MD as PCP - General (Internal Medicine)  DIAGNOSIS:  Encounter Diagnoses  Name Primary?  . Malignant neoplasm metastatic to left lung ([place]) Yes  . Metastatic cancer ([place])   . Liver metastases ([place])   . Malignant neoplasm of upper-outer quadrant of right female breast, unspecified estrogen receptor status ([place])   . 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 In addition to Right breast, hypermetabolic disease in Rt [address] LN, axilla and mediastinum, bulky disease in liver, Pulm Mets, Path fracture 9th rib      09/03/2014 - 11/26/2014 Chemotherapy    Herceptin Perjeta started 09/03/2014, Taxol weekly added 09/10/2014      10/17/2014 Imaging    CT scans: Decrease in right axillary lymph node, decreased skin thickening of the right breast, decreased in bilateral pulmonary nodules, decrease in liver metastases      12/09/2014 PET scan    Remarkable response to chemotherapy resolution of liver metastases, marked improvement in the tumor in the breast, marked improvement in right axillary lymph nodes, resolution of lung nodules, improvement in left sixth rib      12/17/2014 -  Chemotherapy    Herceptin and Perjeta maintenance every 3 weeks       CHIEF COMPLIANT: Follow-up on Herceptin and Perjeta  INTERVAL HISTORY: Denise Morton is a 65 year old lady with metastatic breast cancer currently on Herceptin and Perjeta maintenance. She is tolerating the treatment extremely well. She does not have any nausea or vomiting. She denies any fevers or chills. Her last scan was performed in September 2017. We  plan to do scans every 6 months. She denies any new sites of pain or discomfort.  REVIEW OF SYSTEMS:   Constitutional: Denies fevers, chills or abnormal weight loss Eyes: Denies blurriness of vision Ears, nose, mouth, throat, and face: Denies mucositis or sore throat Respiratory: Denies cough, dyspnea or wheezes Cardiovascular: Denies palpitation, chest discomfort Gastrointestinal:  Denies nausea, heartburn or change in bowel habits Skin: Denies abnormal skin rashes Lymphatics: Denies new lymphadenopathy or easy bruising Neurological:Denies numbness, tingling or new weaknesses Behavioral/Psych: Mood is stable, no new changes  Extremities: No lower extremity edema Breast:  denies any pain or lumps or nodules in either breasts All other systems were reviewed with the patient and are negative.  I have reviewed the past medical history, past surgical history, social history and family history with the patient and they are unchanged from previous note.  ALLERGIES:  is allergic to penicillin g and sulfa antibiotics.  MEDICATIONS:  Current Outpatient Prescriptions  Medication Sig Dispense Refill  . ALPRAZolam (XANAX) 0.25 MG tablet Take 0.25 mg by mouth daily as needed for anxiety.    . carvedilol (COREG) 12.5 MG tablet Take 1 tablet (12.5 mg total) by mouth 2 (two) times daily with a meal. 60 tablet 0  . gabapentin (NEURONTIN) 300 MG capsule Take 1 capsule (300 mg total) by mouth 3 (three) times daily. 90 capsule 6  . lidocaine-prilocaine (EMLA) cream Apply to port 1-2 hours before procedure 30 g 1  . loperamide (IMODIUM) 2 MG capsule Take 2 mg by mouth as  needed for diarrhea or loose stools.    Marland Kitchen losartan-hydrochlorothiazide (HYZAAR) 50-12.5 MG tablet Take 1 tablet by mouth daily.    . metFORMIN (GLUMETZA) 500 MG (MOD) 24 hr tablet Take 1 tablet (500 mg total) by mouth daily with breakfast.    . miconazole (MICOTIN) 2 % cream Apply 1 application topically 2 (two) times daily. 28.35 g 0  .  Multiple Vitamin (MULTIVITAMIN WITH MINERALS) TABS tablet Take 1 tablet by mouth daily.    . nicotine polacrilex (COMMIT) 2 MG lozenge Take 2 mg by mouth as needed for smoking cessation.    . ondansetron (ZOFRAN) 8 MG tablet TAKE ONE TABLET BY MOUTH TWICE DAILY. START THE DAY AFTER CHEMO FOR TWO DAYS. THEN TAKE AS NEEDED FOR NAUSEA OR FOR VOMITING  1  . prochlorperazine (COMPAZINE) 10 MG tablet TAKE ONE TABLET BY MOUTH EVERY 6 HOURS AS NEEDED FOR NAUSEA OR FOR VOMITING  1   No current facility-administered medications for this visit.    Facility-Administered Medications Ordered in Other Visits  Medication Dose Route Frequency Provider Last Rate Last Dose  . sodium chloride 0.9 % injection 10 mL  10 mL Intracatheter PRN Nicholas Lose, MD   10 mL at 02/18/15 1111    PHYSICAL EXAMINATION: ECOG PERFORMANCE STATUS: 0 - Asymptomatic  Vitals:   12/09/15 0809  BP: (!) 147/77  Pulse: 63  Resp: 17  Temp: 98 F (36.7 C)   Filed Weights   12/09/15 0809  Weight: 222 lb 1.6 oz (100.7 kg)    GENERAL:alert, no distress and comfortable SKIN: skin color, texture, turgor are normal, no rashes or significant lesions EYES: normal, Conjunctiva are pink and non-injected, sclera clear OROPHARYNX:no exudate, no erythema and lips, buccal mucosa, and tongue normal  NECK: supple, thyroid normal size, non-tender, without nodularity LYMPH:  no palpable lymphadenopathy in the cervical, axillary or inguinal LUNGS: clear to auscultation and percussion with normal breathing effort HEART: regular rate & rhythm and no murmurs and no lower extremity edema ABDOMEN:abdomen soft, non-tender and normal bowel sounds MUSCULOSKELETAL:no cyanosis of digits and no clubbing  NEURO: alert & oriented x 3 with fluent speech, no focal motor/sensory deficits EXTREMITIES: No lower extremity edema  LABORATORY DATA:  I have reviewed the data as listed   Chemistry      Component Value Date/Time   NA 141 11/18/2015 0856   K  4.1 11/18/2015 0856   CL 108 12/03/2006 1854   CO2 25 11/18/2015 0856   BUN 15.6 11/18/2015 0856   CREATININE 0.7 11/18/2015 0856      Component Value Date/Time   CALCIUM 9.4 11/18/2015 0856   ALKPHOS 91 11/18/2015 0856   AST 14 11/18/2015 0856   ALT 15 11/18/2015 0856   BILITOT 0.41 11/18/2015 0856       Lab Results  Component Value Date   WBC 6.8 12/09/2015   HGB 15.1 12/09/2015   HCT 46.0 12/09/2015   MCV 91.3 12/09/2015   PLT 207 12/09/2015   NEUTROABS 4.4 12/09/2015    ASSESSMENT & PLAN:  Breast cancer of 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 In addition to Right breast, hypermetabolic disease in Rt [address]raclav LN, axilla and mediastinum, bulky disease in liver, Pulm Mets, Path fracture 9th rib   Treatment summary:Taxol weekly(started 09/10/2014 )Herceptin Perjeta every 3  weeks(started 09/03/2014) Received 10 Weeks Taxol, Herceptin Perjeta given once every 3 weeks (8/17 and 9/7 and 9/28, 10/19) ; changed chemo from Taxol to Abraxane for bronchospasm 11/19/14 and completed 12/03/14   PET CT scan 12/09/2014: Complete response in the liver, marked improvement in metastatic disease in the right breast and right axilla, improvement in bone metastases, decrease in lymphadenopathy throughout. Resolution of lung nodules. CT CAP 03/09/15: No new lung mets, stable ovoid nodule in post lateral right breast, No evidence of met disease in abd, low density lesions in liver are unchanged. CT CAP 06/01/2015:no evidence of metastatic disease in the liver with continued complete response to  treatment. --------------------------------------------------------------------------------------------------------------------------------------------------------------------------- Current Treatment: Herceptin Perjeta maintenance started 12/17/14 Chemotherapy toxicities: 1. 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 I increased the Neurontin to 300 by mouth 3 times a day on 08/05/2015  Plan: Continue Herceptin Perjeta every 3 weeks Patient last had a mammogram and colonoscopy with her primary care physician. She gets her mammograms at [facility] Hypertension: Being managed by Dr. Delfina Redwood. Much improved  CT chest abdomen pelvis 10/06/2015: No evidence of metastatic disease Additional scans were performed in March 2018 Patient recently won the fourth rise in the state fair [place] she is very excited about it.  Skin rash on the thigh: I prescribed her miconazole ointment.   Return to clinic every 3 weeks for Herceptin and Perjeta and in 9 weeks for clinic follow-up. Labs every 9 weeks for follow-up  No orders of the defined types were placed in this encounter.  The patient has a good understanding of the overall plan. she agrees with it. she will call with any problems that may develop before the next visit here.   Rulon Eisenmenger, MD 12/09/15

## 2015-12-30 ENCOUNTER — Other Ambulatory Visit: Payer: Self-pay | Admitting: Emergency Medicine

## 2015-12-30 ENCOUNTER — Other Ambulatory Visit (HOSPITAL_BASED_OUTPATIENT_CLINIC_OR_DEPARTMENT_OTHER): Payer: PPO

## 2015-12-30 ENCOUNTER — Ambulatory Visit (HOSPITAL_BASED_OUTPATIENT_CLINIC_OR_DEPARTMENT_OTHER): Payer: PPO

## 2015-12-30 VITALS — BP 150/93 | HR 71 | Temp 98.1°F | Resp 18

## 2015-12-30 DIAGNOSIS — C50411 Malignant neoplasm of upper-outer quadrant of right female breast: Secondary | ICD-10-CM

## 2015-12-30 DIAGNOSIS — Z5112 Encounter for antineoplastic immunotherapy: Secondary | ICD-10-CM | POA: Diagnosis not present

## 2015-12-30 DIAGNOSIS — C7951 Secondary malignant neoplasm of bone: Secondary | ICD-10-CM | POA: Diagnosis not present

## 2015-12-30 DIAGNOSIS — C50919 Malignant neoplasm of unspecified site of unspecified female breast: Secondary | ICD-10-CM

## 2015-12-30 LAB — COMPREHENSIVE METABOLIC PANEL
ALK PHOS: 92 U/L (ref 40–150)
ALT: 14 U/L (ref 0–55)
ANION GAP: 9 meq/L (ref 3–11)
AST: 13 U/L (ref 5–34)
Albumin: 3.3 g/dL — ABNORMAL LOW (ref 3.5–5.0)
BILIRUBIN TOTAL: 0.36 mg/dL (ref 0.20–1.20)
BUN: 25.1 mg/dL (ref 7.0–26.0)
CO2: 23 meq/L (ref 22–29)
Calcium: 9.7 mg/dL (ref 8.4–10.4)
Chloride: 109 mEq/L (ref 98–109)
Creatinine: 0.8 mg/dL (ref 0.6–1.1)
EGFR: 76 mL/min/{1.73_m2} — AB (ref >90)
Glucose: 160 mg/dl — ABNORMAL HIGH (ref 70–140)
Potassium: 4.4 mEq/L (ref 3.5–5.1)
Sodium: 141 mEq/L (ref 136–145)
TOTAL PROTEIN: 7.1 g/dL (ref 6.4–8.3)

## 2015-12-30 LAB — CBC WITH DIFFERENTIAL/PLATELET
BASO%: 0.7 % (ref 0.0–2.0)
BASOS ABS: 0.1 10*3/uL (ref 0.0–0.1)
EOS ABS: 0.1 10*3/uL (ref 0.0–0.5)
EOS%: 1.3 % (ref 0.0–7.0)
HCT: 49.2 % — ABNORMAL HIGH (ref 34.8–46.6)
HGB: 15.8 g/dL (ref 11.6–15.9)
LYMPH%: 26.6 % (ref 14.0–49.7)
MCH: 28.9 pg (ref 25.1–34.0)
MCHC: 32.1 g/dL (ref 31.5–36.0)
MCV: 89.8 fL (ref 79.5–101.0)
MONO#: 0.4 10*3/uL (ref 0.1–0.9)
MONO%: 4.9 % (ref 0.0–14.0)
NEUT%: 66.5 % (ref 38.4–76.8)
NEUTROS ABS: 5.4 10*3/uL (ref 1.5–6.5)
Platelets: 207 10*3/uL (ref 145–400)
RBC: 5.48 10*6/uL — AB (ref 3.70–5.45)
RDW: 14.5 % (ref 11.2–14.5)
WBC: 8.1 10*3/uL (ref 3.9–10.3)
lymph#: 2.1 10*3/uL (ref 0.9–3.3)

## 2015-12-30 MED ORDER — ACETAMINOPHEN 325 MG PO TABS
650.0000 mg | ORAL_TABLET | Freq: Once | ORAL | Status: AC
  Administered 2015-12-30: 650 mg via ORAL

## 2015-12-30 MED ORDER — DIPHENOXYLATE-ATROPINE 2.5-0.025 MG PO TABS: 1.0000 | ORAL_TABLET | Freq: Three times a day (TID) | ORAL | 0 refills | Status: DC | PRN

## 2015-12-30 MED ORDER — SODIUM CHLORIDE 0.9 % IV SOLN: 1000.0000 mL | Freq: Once | INTRAVENOUS | Status: DC

## 2015-12-30 MED ORDER — SODIUM CHLORIDE 0.9 % IV SOLN
420.0000 mg | Freq: Once | INTRAVENOUS | Status: AC
  Administered 2015-12-30: 420 mg via INTRAVENOUS
  Filled 2015-12-30: qty 14

## 2015-12-30 MED ORDER — SODIUM CHLORIDE 0.9 % IV SOLN
INTRAVENOUS | Status: DC
Start: 2015-12-30 — End: 2015-12-30

## 2015-12-30 MED ORDER — DIPHENHYDRAMINE HCL 25 MG PO CAPS
ORAL_CAPSULE | ORAL | Status: AC
  Filled 2015-12-30: qty 2

## 2015-12-30 MED ORDER — DIPHENHYDRAMINE HCL 25 MG PO CAPS
25.0000 mg | ORAL_CAPSULE | Freq: Once | ORAL | Status: AC
  Administered 2015-12-30: 25 mg via ORAL

## 2015-12-30 MED ORDER — SODIUM CHLORIDE 0.9 % IJ SOLN
10.0000 mL | INTRAMUSCULAR | Status: DC | PRN
  Filled 2015-12-30: qty 10

## 2015-12-30 MED ORDER — SODIUM CHLORIDE 0.9 % IV SOLN
6.0000 mg/kg | Freq: Once | INTRAVENOUS | Status: AC
  Administered 2015-12-30: 630 mg via INTRAVENOUS
  Filled 2015-12-30: qty 15.73

## 2015-12-30 MED ORDER — ACETAMINOPHEN 325 MG PO TABS
ORAL_TABLET | ORAL | Status: AC
  Filled 2015-12-30: qty 2

## 2015-12-30 MED ORDER — SODIUM CHLORIDE 0.9 % IV SOLN
Freq: Once | INTRAVENOUS | Status: AC
  Administered 2015-12-30: 09:00:00 via INTRAVENOUS

## 2015-12-30 MED ORDER — HEPARIN SOD (PORK) LOCK FLUSH 100 UNIT/ML IV SOLN
500.0000 [IU] | Freq: Once | INTRAVENOUS | Status: DC | PRN
Start: 2015-12-30 — End: 2015-12-30
  Filled 2015-12-30: qty 5

## 2015-12-30 NOTE — Progress Notes (Signed)
Patient complaining of diarrhea, up to 4 stools a day.  She is taking a total of 8 Imodium tablets in a day to help control diarrhea.  Patient states that she noticed that with taking metformin and chemo her diarrhea increases around day 3 following chemo and she has a harder time controlling the diarrhea with just Imodium.  Per Dr. Rosine Beat called into pharmacy and can take up to three time day and in between Imodium.  Orders to give 1 liter of fluid over course of Chemo treatment today while she is here due to the diarrhea.

## 2015-12-30 NOTE — Patient Instructions (Signed)
[facility] Discharge Instructions for Patients Receiving Chemotherapy  Today you received the following chemotherapy agents Herceptin and Perjeta.   To help prevent nausea and vomiting after your treatment, we encourage you to take your nausea medication as directed.    If you develop nausea and vomiting that is not controlled by your nausea medication, call the clinic.   BELOW ARE SYMPTOMS THAT SHOULD BE REPORTED IMMEDIATELY:  *FEVER GREATER THAN 100.5 F  *CHILLS WITH OR WITHOUT FEVER  NAUSEA AND VOMITING THAT IS NOT CONTROLLED WITH YOUR NAUSEA MEDICATION  *UNUSUAL SHORTNESS OF BREATH  *UNUSUAL BRUISING OR BLEEDING  TENDERNESS IN MOUTH AND THROAT WITH OR WITHOUT PRESENCE OF ULCERS  *URINARY PROBLEMS  *BOWEL PROBLEMS  UNUSUAL RASH Items with * indicate a potential emergency and should be followed up as soon as possible.  Feel free to call the clinic you have any questions or concerns. The clinic phone number is [phone].    

## 2015-12-30 NOTE — Addendum Note (Signed)
Addended by: Neysa Hotter on: 12/30/2015 09:26 AM   Modules accepted: Orders

## 2016-01-20 ENCOUNTER — Ambulatory Visit (HOSPITAL_BASED_OUTPATIENT_CLINIC_OR_DEPARTMENT_OTHER): Payer: PPO

## 2016-01-20 ENCOUNTER — Other Ambulatory Visit: Payer: Self-pay | Admitting: Hematology and Oncology

## 2016-01-20 ENCOUNTER — Other Ambulatory Visit (HOSPITAL_BASED_OUTPATIENT_CLINIC_OR_DEPARTMENT_OTHER): Payer: PPO

## 2016-01-20 VITALS — BP 185/83 | HR 56 | Temp 98.0°F | Resp 19

## 2016-01-20 DIAGNOSIS — C50411 Malignant neoplasm of upper-outer quadrant of right female breast: Secondary | ICD-10-CM

## 2016-01-20 DIAGNOSIS — Z5112 Encounter for antineoplastic immunotherapy: Secondary | ICD-10-CM

## 2016-01-20 LAB — COMPREHENSIVE METABOLIC PANEL
ALT: 14 U/L (ref 0–55)
ANION GAP: 10 meq/L (ref 3–11)
AST: 12 U/L (ref 5–34)
Albumin: 3.4 g/dL — ABNORMAL LOW (ref 3.5–5.0)
Alkaline Phosphatase: 99 U/L (ref 40–150)
BUN: 12.8 mg/dL (ref 7.0–26.0)
CHLORIDE: 106 meq/L (ref 98–109)
CO2: 24 meq/L (ref 22–29)
CREATININE: 0.8 mg/dL (ref 0.6–1.1)
Calcium: 9.7 mg/dL (ref 8.4–10.4)
EGFR: 79 mL/min/{1.73_m2} — ABNORMAL LOW (ref >90)
Glucose: 132 mg/dl (ref 70–140)
POTASSIUM: 4 meq/L (ref 3.5–5.1)
Sodium: 140 mEq/L (ref 136–145)
Total Bilirubin: 0.56 mg/dL (ref 0.20–1.20)
Total Protein: 6.9 g/dL (ref 6.4–8.3)

## 2016-01-20 LAB — CBC WITH DIFFERENTIAL/PLATELET
BASO%: 0.4 % (ref 0.0–2.0)
Basophils Absolute: 0 10*3/uL (ref 0.0–0.1)
EOS%: 1.3 % (ref 0.0–7.0)
Eosinophils Absolute: 0.1 10*3/uL (ref 0.0–0.5)
HCT: 46.3 % (ref 34.8–46.6)
HGB: 15.1 g/dL (ref 11.6–15.9)
LYMPH%: 23.9 % (ref 14.0–49.7)
MCH: 29.6 pg (ref 25.1–34.0)
MCHC: 32.6 g/dL (ref 31.5–36.0)
MCV: 90.8 fL (ref 79.5–101.0)
MONO#: 0.4 10*3/uL (ref 0.1–0.9)
MONO%: 5.6 % (ref 0.0–14.0)
NEUT#: 5.4 10*3/uL (ref 1.5–6.5)
NEUT%: 68.8 % (ref 38.4–76.8)
PLATELETS: 200 10*3/uL (ref 145–400)
RBC: 5.1 10*6/uL (ref 3.70–5.45)
RDW: 14.5 % (ref 11.2–14.5)
WBC: 7.9 10*3/uL (ref 3.9–10.3)
lymph#: 1.9 10*3/uL (ref 0.9–3.3)

## 2016-01-20 MED ORDER — TRASTUZUMAB CHEMO 150 MG IV SOLR
6.0000 mg/kg | Freq: Once | INTRAVENOUS | Status: AC
  Administered 2016-01-20: 630 mg via INTRAVENOUS
  Filled 2016-01-20: qty 30

## 2016-01-20 MED ORDER — SODIUM CHLORIDE 0.9 % IV SOLN
420.0000 mg | Freq: Once | INTRAVENOUS | Status: AC
  Administered 2016-01-20: 420 mg via INTRAVENOUS
  Filled 2016-01-20: qty 14

## 2016-01-20 MED ORDER — DIPHENHYDRAMINE HCL 25 MG PO CAPS
ORAL_CAPSULE | ORAL | Status: AC
  Filled 2016-01-20: qty 2

## 2016-01-20 MED ORDER — SODIUM CHLORIDE 0.9 % IV SOLN
Freq: Once | INTRAVENOUS | Status: AC
  Administered 2016-01-20: 12:00:00 via INTRAVENOUS

## 2016-01-20 MED ORDER — ACETAMINOPHEN 325 MG PO TABS
650.0000 mg | ORAL_TABLET | Freq: Once | ORAL | Status: AC
  Administered 2016-01-20: 650 mg via ORAL

## 2016-01-20 MED ORDER — DIPHENHYDRAMINE HCL 25 MG PO CAPS
25.0000 mg | ORAL_CAPSULE | Freq: Once | ORAL | Status: AC
  Administered 2016-01-20: 25 mg via ORAL

## 2016-01-20 MED ORDER — ACETAMINOPHEN 325 MG PO TABS
ORAL_TABLET | ORAL | Status: AC
  Filled 2016-01-20: qty 2

## 2016-01-20 MED ORDER — SODIUM CHLORIDE 0.9 % IJ SOLN
10.0000 mL | INTRAMUSCULAR | Status: DC | PRN
  Administered 2016-01-20: 10 mL
  Filled 2016-01-20: qty 10

## 2016-01-20 MED ORDER — HEPARIN SOD (PORK) LOCK FLUSH 100 UNIT/ML IV SOLN
500.0000 [IU] | Freq: Once | INTRAVENOUS | Status: AC | PRN
  Administered 2016-01-20: 500 [IU]
  Filled 2016-01-20: qty 5

## 2016-01-20 NOTE — Patient Instructions (Signed)
[facility] Discharge Instructions for Patients Receiving Chemotherapy  Today you received the following chemotherapy agents Herceptin and Perjeta.   To help prevent nausea and vomiting after your treatment, we encourage you to take your nausea medication as directed.    If you develop nausea and vomiting that is not controlled by your nausea medication, call the clinic.   BELOW ARE SYMPTOMS THAT SHOULD BE REPORTED IMMEDIATELY:  *FEVER GREATER THAN 100.5 F  *CHILLS WITH OR WITHOUT FEVER  NAUSEA AND VOMITING THAT IS NOT CONTROLLED WITH YOUR NAUSEA MEDICATION  *UNUSUAL SHORTNESS OF BREATH  *UNUSUAL BRUISING OR BLEEDING  TENDERNESS IN MOUTH AND THROAT WITH OR WITHOUT PRESENCE OF ULCERS  *URINARY PROBLEMS  *BOWEL PROBLEMS  UNUSUAL RASH Items with * indicate a potential emergency and should be followed up as soon as possible.  Feel free to call the clinic you have any questions or concerns. The clinic phone number is [phone].    

## 2016-01-26 ENCOUNTER — Telehealth: Payer: Self-pay | Admitting: Hematology and Oncology

## 2016-01-26 NOTE — Telephone Encounter (Signed)
spoke topatient to advise of appointment time change on 02/10/16 from 9:15am to 8:15am

## 2016-02-09 NOTE — Assessment & Plan Note (Signed)
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 In addition to Right breast, hypermetabolic disease in Rt Supraclav LN, axilla and mediastinum, bulky disease in liver, Pulm Mets, Path fracture 9th rib   Treatment summary:Taxol weekly(started 09/10/2014 )Herceptin Perjeta every 3 weeks(started 09/03/2014) Received 10 Weeks Taxol, Herceptin Perjeta given once every 3 weeks (8/17 and 9/7 and 9/28, 10/19) ; changed chemo from Taxol to Abraxane for bronchospasm 11/19/14 and completed 12/03/14   PET CT scan 12/09/2014: Complete response in the liver, marked improvement in metastatic disease in the right breast and right axilla, improvement in bone metastases, decrease in lymphadenopathy throughout. Resolution of lung nodules. CT CAP 03/09/15: No new lung mets, stable ovoid nodule in post lateral right breast, No evidence of met disease in abd, low density lesions in liver are unchanged. CT CAP 06/01/2015:no evidence of metastatic disease in the liver with continued complete response to treatment. --------------------------------------------------------------------------------------------------------------------------------------------------------------------------- Current Treatment: Herceptin Perjeta maintenance started 12/17/14 Chemotherapy toxicities: 1. 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 I increased the Neurontin to 300 by mouth 3 times a day on 08/05/2015  Plan: Continue Herceptin Perjeta every 3 weeks Patient last had a mammogram and colonoscopy with her primary care physician. She gets her mammograms at [facility]. Hypertension: Being managed by Dr. Delfina Redwood. Much improved  CT chest abdomen pelvis 10/06/2015: No evidence of metastatic disease Additional scans were  performed in March 2018 Patient recently won the fourth rise in the state fair [place] sectionand she is very excited about it.  Skin rash on the thigh: I prescribed her miconazole ointment.   Return to clinic every3 weeks for Herceptin and Perjeta and in 9 weeks for clinic follow-up. Labs every 9 weeks forfollow-up

## 2016-02-10 ENCOUNTER — Other Ambulatory Visit (HOSPITAL_BASED_OUTPATIENT_CLINIC_OR_DEPARTMENT_OTHER): Payer: PPO

## 2016-02-10 ENCOUNTER — Encounter: Payer: Self-pay | Admitting: Hematology and Oncology

## 2016-02-10 ENCOUNTER — Ambulatory Visit (HOSPITAL_BASED_OUTPATIENT_CLINIC_OR_DEPARTMENT_OTHER): Payer: PPO

## 2016-02-10 ENCOUNTER — Ambulatory Visit (HOSPITAL_BASED_OUTPATIENT_CLINIC_OR_DEPARTMENT_OTHER): Payer: PPO | Admitting: Hematology and Oncology

## 2016-02-10 VITALS — BP 163/91 | HR 65 | Temp 98.3°F | Resp 20 | Wt 232.2 lb

## 2016-02-10 DIAGNOSIS — C787 Secondary malignant neoplasm of liver and intrahepatic bile duct: Secondary | ICD-10-CM

## 2016-02-10 DIAGNOSIS — C50411 Malignant neoplasm of upper-outer quadrant of right female breast: Secondary | ICD-10-CM

## 2016-02-10 DIAGNOSIS — G620 Drug-induced polyneuropathy: Secondary | ICD-10-CM

## 2016-02-10 DIAGNOSIS — R21 Rash and other nonspecific skin eruption: Secondary | ICD-10-CM

## 2016-02-10 DIAGNOSIS — Z5112 Encounter for antineoplastic immunotherapy: Secondary | ICD-10-CM | POA: Diagnosis not present

## 2016-02-10 DIAGNOSIS — C7951 Secondary malignant neoplasm of bone: Secondary | ICD-10-CM

## 2016-02-10 DIAGNOSIS — I10 Essential (primary) hypertension: Secondary | ICD-10-CM

## 2016-02-10 DIAGNOSIS — C7800 Secondary malignant neoplasm of unspecified lung: Secondary | ICD-10-CM

## 2016-02-10 DIAGNOSIS — C778 Secondary and unspecified malignant neoplasm of lymph nodes of multiple regions: Secondary | ICD-10-CM | POA: Diagnosis not present

## 2016-02-10 DIAGNOSIS — C7802 Secondary malignant neoplasm of left lung: Secondary | ICD-10-CM

## 2016-02-10 DIAGNOSIS — Z171 Estrogen receptor negative status [ER-]: Secondary | ICD-10-CM

## 2016-02-10 LAB — CBC WITH DIFFERENTIAL/PLATELET
BASO%: 0.4 % (ref 0.0–2.0)
BASOS ABS: 0 10*3/uL (ref 0.0–0.1)
EOS ABS: 0.1 10*3/uL (ref 0.0–0.5)
EOS%: 1.5 % (ref 0.0–7.0)
HEMATOCRIT: 45.9 % (ref 34.8–46.6)
HGB: 14.9 g/dL (ref 11.6–15.9)
LYMPH#: 1.9 10*3/uL (ref 0.9–3.3)
LYMPH%: 25.6 % (ref 14.0–49.7)
MCH: 29.6 pg (ref 25.1–34.0)
MCHC: 32.5 g/dL (ref 31.5–36.0)
MCV: 91.3 fL (ref 79.5–101.0)
MONO#: 0.4 10*3/uL (ref 0.1–0.9)
MONO%: 4.9 % (ref 0.0–14.0)
NEUT#: 5.1 10*3/uL (ref 1.5–6.5)
NEUT%: 67.6 % (ref 38.4–76.8)
Platelets: 196 10*3/uL (ref 145–400)
RBC: 5.03 10*6/uL (ref 3.70–5.45)
RDW: 14.5 % (ref 11.2–14.5)
WBC: 7.6 10*3/uL (ref 3.9–10.3)

## 2016-02-10 LAB — COMPREHENSIVE METABOLIC PANEL
ALT: 12 U/L (ref 0–55)
AST: 12 U/L (ref 5–34)
Albumin: 3.3 g/dL — ABNORMAL LOW (ref 3.5–5.0)
Alkaline Phosphatase: 95 U/L (ref 40–150)
Anion Gap: 10 mEq/L (ref 3–11)
BUN: 14.2 mg/dL (ref 7.0–26.0)
CALCIUM: 9.5 mg/dL (ref 8.4–10.4)
CHLORIDE: 109 meq/L (ref 98–109)
CO2: 23 mEq/L (ref 22–29)
Creatinine: 0.7 mg/dL (ref 0.6–1.1)
EGFR: 86 mL/min/{1.73_m2} — AB (ref >90)
Glucose: 121 mg/dl (ref 70–140)
POTASSIUM: 4.2 meq/L (ref 3.5–5.1)
Sodium: 143 mEq/L (ref 136–145)
Total Bilirubin: 0.44 mg/dL (ref 0.20–1.20)
Total Protein: 6.7 g/dL (ref 6.4–8.3)

## 2016-02-10 MED ORDER — SODIUM CHLORIDE 0.9 % IV SOLN
Freq: Once | INTRAVENOUS | Status: AC
  Administered 2016-02-10: 09:00:00 via INTRAVENOUS

## 2016-02-10 MED ORDER — ACETAMINOPHEN 325 MG PO TABS
650.0000 mg | ORAL_TABLET | Freq: Once | ORAL | Status: AC
  Administered 2016-02-10: 650 mg via ORAL

## 2016-02-10 MED ORDER — HEPARIN SOD (PORK) LOCK FLUSH 100 UNIT/ML IV SOLN
500.0000 [IU] | Freq: Once | INTRAVENOUS | Status: AC | PRN
  Administered 2016-02-10: 500 [IU]
  Filled 2016-02-10: qty 5

## 2016-02-10 MED ORDER — DIPHENHYDRAMINE HCL 25 MG PO CAPS
25.0000 mg | ORAL_CAPSULE | Freq: Once | ORAL | Status: AC
  Administered 2016-02-10: 25 mg via ORAL

## 2016-02-10 MED ORDER — ACETAMINOPHEN 325 MG PO TABS
ORAL_TABLET | ORAL | Status: AC
  Filled 2016-02-10: qty 2

## 2016-02-10 MED ORDER — PERTUZUMAB CHEMO INJECTION 420 MG/14ML
420.0000 mg | Freq: Once | INTRAVENOUS | Status: AC
  Administered 2016-02-10: 420 mg via INTRAVENOUS
  Filled 2016-02-10: qty 14

## 2016-02-10 MED ORDER — SODIUM CHLORIDE 0.9 % IJ SOLN
10.0000 mL | INTRAMUSCULAR | Status: DC | PRN
  Administered 2016-02-10: 10 mL
  Filled 2016-02-10: qty 10

## 2016-02-10 MED ORDER — DIPHENHYDRAMINE HCL 25 MG PO CAPS
ORAL_CAPSULE | ORAL | Status: AC
  Filled 2016-02-10: qty 1

## 2016-02-10 MED ORDER — DIPHENOXYLATE-ATROPINE 2.5-0.025 MG PO TABS: 1.0000 | ORAL_TABLET | Freq: Three times a day (TID) | ORAL | 3 refills | Status: DC | PRN

## 2016-02-10 MED ORDER — TRASTUZUMAB CHEMO 150 MG IV SOLR
6.0000 mg/kg | Freq: Once | INTRAVENOUS | Status: AC
  Administered 2016-02-10: 630 mg via INTRAVENOUS
  Filled 2016-02-10: qty 30

## 2016-02-10 NOTE — Progress Notes (Signed)
Patient Care Team: Seward Carol, MD as PCP - General (Internal Medicine)  DIAGNOSIS:  Encounter Diagnoses  Name Primary?  . Malignant neoplasm of upper-outer quadrant of right breast in female, estrogen receptor negative ([place])   . 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 Liver metastases ([place])     SUMMARY OF ONCOLOGIC HISTORY:   Breast cancer of upper-outer quadrant of right female breast ([place])   08/15/2014 Initial Diagnosis    Right Breast Inflammatory invasive ductal cancer: 17 cm by ultrasound along with 13 cm ulceration, node also positive T4N1 (stage 3C) ER 0%, PR 0%, Her-2 Pending      08/22/2014 PET scan    Metastatic disease. In addition to Right breast, hypermetabolic disease in Rt Supraclav LN, axilla and mediastinum, bulky disease in liver, Pulm Mets, Path fracture 9th rib      09/03/2014 - 11/26/2014 Chemotherapy    Herceptin Perjeta started 09/03/2014, Taxol weekly added 09/10/2014      10/17/2014 Imaging    CT scans: Decrease in right axillary lymph node, decreased skin thickening of the right breast, decreased in bilateral pulmonary nodules, decrease in liver metastases      12/09/2014 PET scan    Remarkable response to chemotherapy resolution of liver metastases, marked improvement in the tumor in the breast, marked improvement in right axillary lymph nodes, resolution of lung nodules, improvement in left sixth rib      12/17/2014 -  Chemotherapy    Herceptin and Perjeta maintenance every 3 weeks       CHIEF COMPLIANT: Herceptin and Perjeta maintenance  INTERVAL HISTORY: Denise Morton is a 66 year old with above-mentioned history metastatic breast cancer currently on Herceptin and Perjeta maintenance. She has loose stools every day. She takes Lomotil which appears to be controlling it very well. She denies any nausea vomiting. She has excellent energy levels. She is hydrating very well.  REVIEW OF SYSTEMS:   Constitutional: Denies fevers,  chills or abnormal weight loss Eyes: Denies blurriness of vision Ears, nose, mouth, throat, and face: Denies mucositis or sore throat Respiratory: Denies cough, dyspnea or wheezes Cardiovascular: Denies palpitation, chest discomfort Gastrointestinal:  Denies nausea, heartburn or change in bowel habits Skin: Denies abnormal skin rashes Lymphatics: Denies new lymphadenopathy or easy bruising Neurological:Denies numbness, tingling or new weaknesses Behavioral/Psych: Mood is stable, no new changes  Extremities: No lower extremity edema Breast:  denies any pain or lumps or nodules in either breasts All other systems were reviewed with the patient and are negative.  I have reviewed the past medical history, past surgical history, social history and family history with the patient and they are unchanged from previous note.  ALLERGIES:  is allergic to penicillin g and sulfa antibiotics.  MEDICATIONS:  Current Outpatient Prescriptions  Medication Sig Dispense Refill  . ALPRAZolam (XANAX) 0.25 MG tablet Take 0.25 mg by mouth daily as needed for anxiety.    . carvedilol (COREG) 12.5 MG tablet Take 1 tablet (12.5 mg total) by mouth 2 (two) times daily with a meal. 60 tablet 0  . diphenoxylate-atropine (LOMOTIL) 2.5-0.025 MG tablet Take 1 tablet by mouth 3 (three) times daily as needed for diarrhea or loose stools. 60 tablet 3  . gabapentin (NEURONTIN) 300 MG capsule Take 1 capsule (300 mg total) by mouth 3 (three) times daily. 90 capsule 6  . lidocaine-prilocaine (EMLA) cream Apply to port 1-2 hours before procedure 30 g 1  . loperamide (IMODIUM) 2 MG capsule Take 2 mg by mouth as needed  for diarrhea or loose stools.    Marland Kitchen losartan-hydrochlorothiazide (HYZAAR) 50-12.5 MG tablet Take 1 tablet by mouth daily.    . metFORMIN (GLUMETZA) 500 MG (MOD) 24 hr tablet Take 1 tablet (500 mg total) by mouth daily with breakfast.    . miconazole (MICOTIN) 2 % cream Apply 1 application topically 2 (two) times  daily. 28.35 g 0  . Multiple Vitamin (MULTIVITAMIN WITH MINERALS) TABS tablet Take 1 tablet by mouth daily.    . nicotine polacrilex (COMMIT) 2 MG lozenge Take 2 mg by mouth as needed for smoking cessation.    . ondansetron (ZOFRAN) 8 MG tablet TAKE ONE TABLET BY MOUTH TWICE DAILY. START THE DAY AFTER CHEMO FOR TWO DAYS. THEN TAKE AS NEEDED FOR NAUSEA OR FOR VOMITING  1  . prochlorperazine (COMPAZINE) 10 MG tablet TAKE ONE TABLET BY MOUTH EVERY 6 HOURS AS NEEDED FOR NAUSEA OR FOR VOMITING  1   No current facility-administered medications for this visit.    Facility-Administered Medications Ordered in Other Visits  Medication Dose Route Frequency Provider Last Rate Last Dose  . heparin lock flush 100 unit/mL  500 Units Intracatheter Once PRN Nicholas Lose, MD      . pertuzumab (PERJETA) 420 mg in sodium chloride 0.9 % 250 mL chemo infusion  420 mg Intravenous Once Nicholas Lose, MD      . sodium chloride 0.9 % injection 10 mL  10 mL Intracatheter PRN Nicholas Lose, MD   10 mL at 02/18/15 1111  . sodium chloride 0.9 % injection 10 mL  10 mL Intracatheter PRN Nicholas Lose, MD      . trastuzumab (HERCEPTIN) 630 mg in sodium chloride 0.9 % 250 mL chemo infusion  6 mg/kg (Treatment Plan Recorded) Intravenous Once Nicholas Lose, MD        PHYSICAL EXAMINATION: ECOG PERFORMANCE STATUS: 1 - Symptomatic but completely ambulatory  Vitals:   02/10/16 0810 02/10/16 0811  BP: (!) 179/93 (!) 163/91  Pulse: 65   Resp: 20   Temp: 98.3 F (36.8 C)    Filed Weights   02/10/16 0810  Weight: 232 lb 3.2 oz (105.3 kg)    GENERAL:alert, no distress and comfortable SKIN: skin color, texture, turgor are normal, no rashes or significant lesions EYES: normal, Conjunctiva are pink and non-injected, sclera clear OROPHARYNX:no exudate, no erythema and lips, buccal mucosa, and tongue normal  NECK: supple, thyroid normal size, non-tender, without nodularity LYMPH:  no palpable lymphadenopathy in the cervical,  axillary or inguinal LUNGS: clear to auscultation and percussion with normal breathing effort HEART: regular rate & rhythm and no murmurs and no lower extremity edema ABDOMEN:abdomen soft, non-tender and normal bowel sounds MUSCULOSKELETAL:no cyanosis of digits and no clubbing  NEURO: alert & oriented x 3 with fluent speech, no focal motor/sensory deficits EXTREMITIES: No lower extremity edema  LABORATORY DATA:  I have reviewed the data as listed   Chemistry      Component Value Date/Time   NA 143 02/10/2016 0749   K 4.2 02/10/2016 0749   CL 108 12/03/2006 1854   CO2 23 02/10/2016 0749   BUN 14.2 02/10/2016 0749   CREATININE 0.7 02/10/2016 0749      Component Value Date/Time   CALCIUM 9.5 02/10/2016 0749   ALKPHOS 95 02/10/2016 0749   AST 12 02/10/2016 0749   ALT 12 02/10/2016 0749   BILITOT 0.44 02/10/2016 0749       Lab Results  Component Value Date   WBC 7.6 02/10/2016   HGB  14.9 02/10/2016   HCT 45.9 02/10/2016   MCV 91.3 02/10/2016   PLT 196 02/10/2016   NEUTROABS 5.1 02/10/2016    ASSESSMENT & PLAN:  Breast cancer of 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 In addition to Right breast, hypermetabolic disease in Rt Supraclav LN, axilla and mediastinum, bulky disease in liver, Pulm Mets, Path fracture 9th rib   Treatment summary:Taxol weekly(started 09/10/2014 )Herceptin Perjeta every 3 weeks(started 09/03/2014) Received 10 Weeks Taxol, Herceptin Perjeta given once every 3 weeks (8/17 and 9/7 and 9/28, 10/19) ; changed chemo from Taxol to Abraxane for bronchospasm 11/19/14 and completed 12/03/14   PET CT scan 12/09/2014: Complete response in the liver, marked improvement in metastatic disease in the right breast and right axilla,  improvement in bone metastases, decrease in lymphadenopathy throughout. Resolution of lung nodules. CT CAP 03/09/15: No new lung mets, stable ovoid nodule in post lateral right breast, No evidence of met disease in abd, low density lesions in liver are unchanged. CT CAP 06/01/2015:no evidence of metastatic disease in the liver with continued complete response to treatment. --------------------------------------------------------------------------------------------------------------------------------------------------------------------------- Current Treatment: Herceptin Perjeta maintenance started 12/17/14 Chemotherapy toxicities: 1. 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 I increased the Neurontin to 300 by mouth 3 times a day on 08/05/2015  Plan: Continue Herceptin Perjeta every 3 weeks Patient last had a mammogram and colonoscopy with her primary care physician. She gets her mammograms at [facility]. Hypertension: Being managed by Dr. Delfina Redwood. Not very well controlled today.  CT chest abdomen pelvis 10/06/2015: No evidence of metastatic disease Additional scans will be performed in March 2018. I sent an order today.  Skin rash on the thigh: I prescribed her miconazole ointment.   Return to clinic every3 weeks for Herceptin and Perjeta and in 9 weeks for clinic follow-up after scans. Labs every 9 weeks forfollow-up    I spent 25 minutes talking to the patient of which more than half was spent in counseling and coordination of care.  Orders Placed This Encounter  Procedures  . CT Abdomen Pelvis W Contrast    Standing Status:   Future    Standing Expiration Date:   02/09/2017    Order Specific Question:   If indicated for the ordered procedure, I authorize the administration of contrast media per Radiology protocol    Answer:   Yes    Order Specific Question:   Reason for Exam (SYMPTOM  OR DIAGNOSIS REQUIRED)    Answer:    Metastatic breast cancer restaging on treatment    Order Specific Question:   Preferred imaging location?    Answer:   [facility]  . CT Chest W Contrast    Standing Status:   Future    Standing Expiration Date:   02/09/2017    Order Specific Question:   If indicated for the ordered procedure, I authorize the administration of contrast media per Radiology protocol    Answer:   Yes    Order Specific Question:   Reason for Exam (SYMPTOM  OR DIAGNOSIS REQUIRED)    Answer:   Metastatic breast cancer restaging on treatment    Order Specific Question:   Preferred imaging location?    Answer:   [facility]   The patient has a good understanding of the  overall plan. she agrees with it. she will call with any problems that may develop before the next visit here.   Rulon Eisenmenger, MD 02/10/16

## 2016-02-10 NOTE — Patient Instructions (Signed)
[facility] Discharge Instructions for Patients Receiving Chemotherapy  Today you received the following chemotherapy agents Herceptin and Perjeta.   To help prevent nausea and vomiting after your treatment, we encourage you to take your nausea medication as directed.    If you develop nausea and vomiting that is not controlled by your nausea medication, call the clinic.   BELOW ARE SYMPTOMS THAT SHOULD BE REPORTED IMMEDIATELY:  *FEVER GREATER THAN 100.5 F  *CHILLS WITH OR WITHOUT FEVER  NAUSEA AND VOMITING THAT IS NOT CONTROLLED WITH YOUR NAUSEA MEDICATION  *UNUSUAL SHORTNESS OF BREATH  *UNUSUAL BRUISING OR BLEEDING  TENDERNESS IN MOUTH AND THROAT WITH OR WITHOUT PRESENCE OF ULCERS  *URINARY PROBLEMS  *BOWEL PROBLEMS  UNUSUAL RASH Items with * indicate a potential emergency and should be followed up as soon as possible.  Feel free to call the clinic you have any questions or concerns. The clinic phone number is [phone].    

## 2016-03-01 ENCOUNTER — Other Ambulatory Visit: Payer: Self-pay | Admitting: Emergency Medicine

## 2016-03-01 DIAGNOSIS — Z171 Estrogen receptor negative status [ER-]: Principal | ICD-10-CM

## 2016-03-01 DIAGNOSIS — C50411 Malignant neoplasm of upper-outer quadrant of right female breast: Secondary | ICD-10-CM

## 2016-03-02 ENCOUNTER — Other Ambulatory Visit (HOSPITAL_BASED_OUTPATIENT_CLINIC_OR_DEPARTMENT_OTHER): Payer: PPO

## 2016-03-02 ENCOUNTER — Ambulatory Visit (HOSPITAL_BASED_OUTPATIENT_CLINIC_OR_DEPARTMENT_OTHER): Payer: PPO

## 2016-03-02 VITALS — BP 179/95 | HR 49 | Temp 97.7°F | Resp 19

## 2016-03-02 DIAGNOSIS — Z5112 Encounter for antineoplastic immunotherapy: Secondary | ICD-10-CM

## 2016-03-02 DIAGNOSIS — C50411 Malignant neoplasm of upper-outer quadrant of right female breast: Secondary | ICD-10-CM

## 2016-03-02 LAB — COMPREHENSIVE METABOLIC PANEL
ALBUMIN: 3.3 g/dL — AB (ref 3.5–5.0)
ALK PHOS: 91 U/L (ref 40–150)
ALT: 14 U/L (ref 0–55)
AST: 13 U/L (ref 5–34)
Anion Gap: 9 mEq/L (ref 3–11)
BUN: 18.2 mg/dL (ref 7.0–26.0)
CALCIUM: 9.8 mg/dL (ref 8.4–10.4)
CO2: 23 mEq/L (ref 22–29)
Chloride: 110 mEq/L — ABNORMAL HIGH (ref 98–109)
Creatinine: 0.9 mg/dL (ref 0.6–1.1)
EGFR: 72 mL/min/{1.73_m2} — AB (ref >90)
Glucose: 132 mg/dl (ref 70–140)
POTASSIUM: 4.3 meq/L (ref 3.5–5.1)
Sodium: 142 mEq/L (ref 136–145)
Total Bilirubin: 0.49 mg/dL (ref 0.20–1.20)
Total Protein: 6.7 g/dL (ref 6.4–8.3)

## 2016-03-02 LAB — CBC WITH DIFFERENTIAL/PLATELET
BASO%: 0.2 % (ref 0.0–2.0)
BASOS ABS: 0 10*3/uL (ref 0.0–0.1)
EOS ABS: 0.1 10*3/uL (ref 0.0–0.5)
EOS%: 1.3 % (ref 0.0–7.0)
HEMATOCRIT: 45.3 % (ref 34.8–46.6)
HEMOGLOBIN: 14.8 g/dL (ref 11.6–15.9)
LYMPH#: 1.9 10*3/uL (ref 0.9–3.3)
LYMPH%: 30 % (ref 14.0–49.7)
MCH: 29.8 pg (ref 25.1–34.0)
MCHC: 32.7 g/dL (ref 31.5–36.0)
MCV: 91.1 fL (ref 79.5–101.0)
MONO#: 0.3 10*3/uL (ref 0.1–0.9)
MONO%: 5 % (ref 0.0–14.0)
NEUT#: 4 10*3/uL (ref 1.5–6.5)
NEUT%: 63.5 % (ref 38.4–76.8)
Platelets: 192 10*3/uL (ref 145–400)
RBC: 4.97 10*6/uL (ref 3.70–5.45)
RDW: 14.4 % (ref 11.2–14.5)
WBC: 6.2 10*3/uL (ref 3.9–10.3)

## 2016-03-02 MED ORDER — DIPHENHYDRAMINE HCL 25 MG PO CAPS
ORAL_CAPSULE | ORAL | Status: AC
  Filled 2016-03-02: qty 1

## 2016-03-02 MED ORDER — DIPHENHYDRAMINE HCL 25 MG PO CAPS
25.0000 mg | ORAL_CAPSULE | Freq: Once | ORAL | Status: AC
  Administered 2016-03-02: 25 mg via ORAL

## 2016-03-02 MED ORDER — TRASTUZUMAB CHEMO 150 MG IV SOLR
6.0000 mg/kg | Freq: Once | INTRAVENOUS | Status: AC
  Administered 2016-03-02: 630 mg via INTRAVENOUS
  Filled 2016-03-02: qty 30

## 2016-03-02 MED ORDER — ACETAMINOPHEN 325 MG PO TABS
650.0000 mg | ORAL_TABLET | Freq: Once | ORAL | Status: AC
  Administered 2016-03-02: 650 mg via ORAL

## 2016-03-02 MED ORDER — ACETAMINOPHEN 325 MG PO TABS
ORAL_TABLET | ORAL | Status: AC
  Filled 2016-03-02: qty 2

## 2016-03-02 MED ORDER — SODIUM CHLORIDE 0.9 % IJ SOLN
10.0000 mL | INTRAMUSCULAR | Status: DC | PRN
  Administered 2016-03-02: 10 mL
  Filled 2016-03-02: qty 10

## 2016-03-02 MED ORDER — HEPARIN SOD (PORK) LOCK FLUSH 100 UNIT/ML IV SOLN
500.0000 [IU] | Freq: Once | INTRAVENOUS | Status: AC | PRN
  Administered 2016-03-02: 500 [IU]
  Filled 2016-03-02: qty 5

## 2016-03-02 MED ORDER — SODIUM CHLORIDE 0.9 % IV SOLN
420.0000 mg | Freq: Once | INTRAVENOUS | Status: AC
  Administered 2016-03-02: 420 mg via INTRAVENOUS
  Filled 2016-03-02: qty 14

## 2016-03-02 MED ORDER — SODIUM CHLORIDE 0.9 % IV SOLN
Freq: Once | INTRAVENOUS | Status: AC
  Administered 2016-03-02: 11:00:00 via INTRAVENOUS

## 2016-03-02 NOTE — Progress Notes (Signed)
Ok to treat today, echo scheduled for Feb 16. Discussed with patient.

## 2016-03-02 NOTE — Patient Instructions (Signed)
[facility] Discharge Instructions for Patients Receiving Chemotherapy  Today you received the following chemotherapy agents Herceptin and Perjeta.   To help prevent nausea and vomiting after your treatment, we encourage you to take your nausea medication as directed.    If you develop nausea and vomiting that is not controlled by your nausea medication, call the clinic.   BELOW ARE SYMPTOMS THAT SHOULD BE REPORTED IMMEDIATELY:  *FEVER GREATER THAN 100.5 F  *CHILLS WITH OR WITHOUT FEVER  NAUSEA AND VOMITING THAT IS NOT CONTROLLED WITH YOUR NAUSEA MEDICATION  *UNUSUAL SHORTNESS OF BREATH  *UNUSUAL BRUISING OR BLEEDING  TENDERNESS IN MOUTH AND THROAT WITH OR WITHOUT PRESENCE OF ULCERS  *URINARY PROBLEMS  *BOWEL PROBLEMS  UNUSUAL RASH Items with * indicate a potential emergency and should be followed up as soon as possible.  Feel free to call the clinic you have any questions or concerns. The clinic phone number is [phone].    

## 2016-03-04 ENCOUNTER — Other Ambulatory Visit (HOSPITAL_COMMUNITY): Payer: PPO

## 2016-03-07 ENCOUNTER — Ambulatory Visit (HOSPITAL_COMMUNITY)
Admission: RE | Admit: 2016-03-07 | Discharge: 2016-03-07 | Disposition: A | Discharge status: Home / Self Care | Payer: PPO | Source: Ambulatory Visit | Attending: Hematology and Oncology | Admitting: Hematology and Oncology

## 2016-03-07 DIAGNOSIS — I340 Nonrheumatic mitral (valve) insufficiency: Secondary | ICD-10-CM | POA: Diagnosis not present

## 2016-03-07 DIAGNOSIS — I361 Nonrheumatic tricuspid (valve) insufficiency: Secondary | ICD-10-CM | POA: Diagnosis not present

## 2016-03-07 DIAGNOSIS — C50411 Malignant neoplasm of upper-outer quadrant of right female breast: Secondary | ICD-10-CM | POA: Diagnosis not present

## 2016-03-07 DIAGNOSIS — Z171 Estrogen receptor negative status [ER-]: Secondary | ICD-10-CM | POA: Diagnosis not present

## 2016-03-07 DIAGNOSIS — Z09 Encounter for follow-up examination after completed treatment for conditions other than malignant neoplasm: Secondary | ICD-10-CM | POA: Insufficient documentation

## 2016-03-07 DIAGNOSIS — I119 Hypertensive heart disease without heart failure: Secondary | ICD-10-CM | POA: Insufficient documentation

## 2016-03-07 NOTE — Progress Notes (Signed)
  Echocardiogram 2D Echocardiogram has been performed.  Jennette Dubin 03/07/2016, 10:59 AM

## 2016-03-23 ENCOUNTER — Ambulatory Visit (HOSPITAL_BASED_OUTPATIENT_CLINIC_OR_DEPARTMENT_OTHER): Payer: PPO

## 2016-03-23 ENCOUNTER — Other Ambulatory Visit (HOSPITAL_BASED_OUTPATIENT_CLINIC_OR_DEPARTMENT_OTHER): Payer: PPO

## 2016-03-23 VITALS — BP 149/69 | HR 51 | Temp 97.5°F | Resp 17

## 2016-03-23 DIAGNOSIS — C50411 Malignant neoplasm of upper-outer quadrant of right female breast: Secondary | ICD-10-CM

## 2016-03-23 DIAGNOSIS — Z5112 Encounter for antineoplastic immunotherapy: Secondary | ICD-10-CM | POA: Diagnosis not present

## 2016-03-23 LAB — CBC WITH DIFFERENTIAL/PLATELET
BASO%: 0.3 % (ref 0.0–2.0)
Basophils Absolute: 0 10*3/uL (ref 0.0–0.1)
EOS%: 1.3 % (ref 0.0–7.0)
Eosinophils Absolute: 0.1 10*3/uL (ref 0.0–0.5)
HEMATOCRIT: 47.3 % — AB (ref 34.8–46.6)
HEMOGLOBIN: 15.3 g/dL (ref 11.6–15.9)
LYMPH#: 1.9 10*3/uL (ref 0.9–3.3)
LYMPH%: 30.3 % (ref 14.0–49.7)
MCH: 29.9 pg (ref 25.1–34.0)
MCHC: 32.3 g/dL (ref 31.5–36.0)
MCV: 92.4 fL (ref 79.5–101.0)
MONO#: 0.4 10*3/uL (ref 0.1–0.9)
MONO%: 5.5 % (ref 0.0–14.0)
NEUT%: 62.6 % (ref 38.4–76.8)
NEUTROS ABS: 4 10*3/uL (ref 1.5–6.5)
Platelets: 186 10*3/uL (ref 145–400)
RBC: 5.12 10*6/uL (ref 3.70–5.45)
RDW: 15.1 % — AB (ref 11.2–14.5)
WBC: 6.4 10*3/uL (ref 3.9–10.3)

## 2016-03-23 LAB — COMPREHENSIVE METABOLIC PANEL
ALBUMIN: 3.5 g/dL (ref 3.5–5.0)
ALK PHOS: 85 U/L (ref 40–150)
ALT: 17 U/L (ref 0–55)
AST: 13 U/L (ref 5–34)
Anion Gap: 8 mEq/L (ref 3–11)
BILIRUBIN TOTAL: 0.34 mg/dL (ref 0.20–1.20)
BUN: 22.5 mg/dL (ref 7.0–26.0)
CALCIUM: 9.7 mg/dL (ref 8.4–10.4)
CO2: 25 mEq/L (ref 22–29)
CREATININE: 0.8 mg/dL (ref 0.6–1.1)
Chloride: 111 mEq/L — ABNORMAL HIGH (ref 98–109)
EGFR: 78 mL/min/{1.73_m2} — ABNORMAL LOW (ref >90)
GLUCOSE: 123 mg/dL (ref 70–140)
POTASSIUM: 4.1 meq/L (ref 3.5–5.1)
SODIUM: 144 meq/L (ref 136–145)
TOTAL PROTEIN: 6.9 g/dL (ref 6.4–8.3)

## 2016-03-23 MED ORDER — ACETAMINOPHEN 325 MG PO TABS
650.0000 mg | ORAL_TABLET | Freq: Once | ORAL | Status: AC
  Administered 2016-03-23: 650 mg via ORAL

## 2016-03-23 MED ORDER — SODIUM CHLORIDE 0.9 % IV SOLN
420.0000 mg | Freq: Once | INTRAVENOUS | Status: AC
  Administered 2016-03-23: 420 mg via INTRAVENOUS
  Filled 2016-03-23: qty 14

## 2016-03-23 MED ORDER — SODIUM CHLORIDE 0.9 % IV SOLN
Freq: Once | INTRAVENOUS | Status: AC
  Administered 2016-03-23: 10:00:00 via INTRAVENOUS

## 2016-03-23 MED ORDER — DIPHENHYDRAMINE HCL 25 MG PO CAPS
ORAL_CAPSULE | ORAL | Status: AC
  Filled 2016-03-23: qty 1

## 2016-03-23 MED ORDER — SODIUM CHLORIDE 0.9 % IJ SOLN
10.0000 mL | INTRAMUSCULAR | Status: DC | PRN
  Administered 2016-03-23: 10 mL
  Filled 2016-03-23: qty 10

## 2016-03-23 MED ORDER — HEPARIN SOD (PORK) LOCK FLUSH 100 UNIT/ML IV SOLN
500.0000 [IU] | Freq: Once | INTRAVENOUS | Status: AC | PRN
  Administered 2016-03-23: 500 [IU]
  Filled 2016-03-23: qty 5

## 2016-03-23 MED ORDER — TRASTUZUMAB CHEMO 150 MG IV SOLR
6.0000 mg/kg | Freq: Once | INTRAVENOUS | Status: AC
  Administered 2016-03-23: 630 mg via INTRAVENOUS
  Filled 2016-03-23: qty 30

## 2016-03-23 MED ORDER — DIPHENHYDRAMINE HCL 25 MG PO CAPS
25.0000 mg | ORAL_CAPSULE | Freq: Once | ORAL | Status: AC
  Administered 2016-03-23: 25 mg via ORAL

## 2016-03-23 MED ORDER — ACETAMINOPHEN 325 MG PO TABS
ORAL_TABLET | ORAL | Status: AC
  Filled 2016-03-23: qty 2

## 2016-03-23 NOTE — Progress Notes (Signed)
1122: Pt refused to stay 30 minutes observation. Pt tolerated infusion well. Pt and VS stable at discharge.

## 2016-03-23 NOTE — Patient Instructions (Signed)
[facility] Discharge Instructions for Patients Receiving Chemotherapy  Today you received the following chemotherapy agents Herceptin and Perjeta   To help prevent nausea and vomiting after your treatment, we encourage you to take your nausea medication as directed.    If you develop nausea and vomiting that is not controlled by your nausea medication, call the clinic.   BELOW ARE SYMPTOMS THAT SHOULD BE REPORTED IMMEDIATELY:  *FEVER GREATER THAN 100.5 F  *CHILLS WITH OR WITHOUT FEVER  NAUSEA AND VOMITING THAT IS NOT CONTROLLED WITH YOUR NAUSEA MEDICATION  *UNUSUAL SHORTNESS OF BREATH  *UNUSUAL BRUISING OR BLEEDING  TENDERNESS IN MOUTH AND THROAT WITH OR WITHOUT PRESENCE OF ULCERS  *URINARY PROBLEMS  *BOWEL PROBLEMS  UNUSUAL RASH Items with * indicate a potential emergency and should be followed up as soon as possible.  Feel free to call the clinic you have any questions or concerns. The clinic phone number is [phone].  Please show the CHEMO ALERT CARD at check-in to the Emergency Department and triage nurse.   

## 2016-03-27 ENCOUNTER — Other Ambulatory Visit: Payer: Self-pay | Admitting: Hematology and Oncology

## 2016-03-27 DIAGNOSIS — C50411 Malignant neoplasm of upper-outer quadrant of right female breast: Secondary | ICD-10-CM

## 2016-04-12 ENCOUNTER — Ambulatory Visit (HOSPITAL_COMMUNITY)
Admission: RE | Admit: 2016-04-12 | Discharge: 2016-04-12 | Disposition: A | Discharge status: Home / Self Care | Payer: PPO | Source: Ambulatory Visit | Attending: Hematology and Oncology | Admitting: Hematology and Oncology

## 2016-04-12 ENCOUNTER — Encounter (HOSPITAL_COMMUNITY): Payer: Self-pay

## 2016-04-12 DIAGNOSIS — C50411 Malignant neoplasm of upper-outer quadrant of right female breast: Secondary | ICD-10-CM | POA: Insufficient documentation

## 2016-04-12 DIAGNOSIS — C50919 Malignant neoplasm of unspecified site of unspecified female breast: Secondary | ICD-10-CM | POA: Diagnosis not present

## 2016-04-12 DIAGNOSIS — I700 Atherosclerosis of aorta: Secondary | ICD-10-CM | POA: Diagnosis not present

## 2016-04-12 DIAGNOSIS — R918 Other nonspecific abnormal finding of lung field: Secondary | ICD-10-CM | POA: Diagnosis not present

## 2016-04-12 DIAGNOSIS — Z171 Estrogen receptor negative status [ER-]: Secondary | ICD-10-CM | POA: Diagnosis not present

## 2016-04-12 HISTORY — DX: Type 2 diabetes mellitus without complications: E11.9

## 2016-04-12 MED ORDER — IOPAMIDOL (ISOVUE-300) INJECTION 61%
INTRAVENOUS | Status: AC
  Filled 2016-04-12: qty 100

## 2016-04-12 MED ORDER — IOPAMIDOL (ISOVUE-300) INJECTION 61%
100.0000 mL | Freq: Once | INTRAVENOUS | Status: AC | PRN
  Administered 2016-04-12: 100 mL via INTRAVENOUS

## 2016-04-13 ENCOUNTER — Ambulatory Visit (HOSPITAL_BASED_OUTPATIENT_CLINIC_OR_DEPARTMENT_OTHER): Payer: PPO

## 2016-04-13 VITALS — BP 183/96 | HR 57 | Temp 98.7°F | Resp 17

## 2016-04-13 DIAGNOSIS — C50411 Malignant neoplasm of upper-outer quadrant of right female breast: Secondary | ICD-10-CM | POA: Diagnosis not present

## 2016-04-13 DIAGNOSIS — I10 Essential (primary) hypertension: Secondary | ICD-10-CM

## 2016-04-13 DIAGNOSIS — Z5112 Encounter for antineoplastic immunotherapy: Secondary | ICD-10-CM

## 2016-04-13 LAB — COMPREHENSIVE METABOLIC PANEL
ALBUMIN: 3.4 g/dL — AB (ref 3.5–5.0)
ALK PHOS: 84 U/L (ref 40–150)
ALT: 18 U/L (ref 0–55)
AST: 15 U/L (ref 5–34)
Anion Gap: 10 mEq/L (ref 3–11)
BUN: 17 mg/dL (ref 7.0–26.0)
CO2: 21 meq/L — AB (ref 22–29)
Calcium: 9.5 mg/dL (ref 8.4–10.4)
Chloride: 110 mEq/L — ABNORMAL HIGH (ref 98–109)
Creatinine: 0.8 mg/dL (ref 0.6–1.1)
EGFR: 83 mL/min/{1.73_m2} — AB (ref >90)
GLUCOSE: 124 mg/dL (ref 70–140)
POTASSIUM: 3.8 meq/L (ref 3.5–5.1)
SODIUM: 142 meq/L (ref 136–145)
Total Bilirubin: 0.53 mg/dL (ref 0.20–1.20)
Total Protein: 6.8 g/dL (ref 6.4–8.3)

## 2016-04-13 LAB — CBC WITH DIFFERENTIAL/PLATELET
BASO%: 0.9 % (ref 0.0–2.0)
BASOS ABS: 0.1 10*3/uL (ref 0.0–0.1)
EOS ABS: 0.1 10*3/uL (ref 0.0–0.5)
EOS%: 0.7 % (ref 0.0–7.0)
HCT: 46.3 % (ref 34.8–46.6)
HGB: 15.3 g/dL (ref 11.6–15.9)
LYMPH#: 1.8 10*3/uL (ref 0.9–3.3)
LYMPH%: 23.8 % (ref 14.0–49.7)
MCH: 29.8 pg (ref 25.1–34.0)
MCHC: 33 g/dL (ref 31.5–36.0)
MCV: 90.1 fL (ref 79.5–101.0)
MONO#: 0.4 10*3/uL (ref 0.1–0.9)
MONO%: 4.9 % (ref 0.0–14.0)
NEUT#: 5.2 10*3/uL (ref 1.5–6.5)
NEUT%: 69.7 % (ref 38.4–76.8)
PLATELETS: 185 10*3/uL (ref 145–400)
RBC: 5.14 10*6/uL (ref 3.70–5.45)
RDW: 14.9 % — ABNORMAL HIGH (ref 11.2–14.5)
WBC: 7.5 10*3/uL (ref 3.9–10.3)

## 2016-04-13 MED ORDER — HEPARIN SOD (PORK) LOCK FLUSH 100 UNIT/ML IV SOLN
500.0000 [IU] | Freq: Once | INTRAVENOUS | Status: AC | PRN
  Administered 2016-04-13: 500 [IU]
  Filled 2016-04-13: qty 5

## 2016-04-13 MED ORDER — CLONIDINE HCL 0.1 MG PO TABS
0.3000 mg | ORAL_TABLET | Freq: Once | ORAL | Status: AC
  Administered 2016-04-13: 0.3 mg via ORAL

## 2016-04-13 MED ORDER — ACETAMINOPHEN 325 MG PO TABS
ORAL_TABLET | ORAL | Status: AC
  Filled 2016-04-13: qty 2

## 2016-04-13 MED ORDER — DIPHENHYDRAMINE HCL 25 MG PO CAPS
25.0000 mg | ORAL_CAPSULE | Freq: Once | ORAL | Status: AC
  Administered 2016-04-13: 25 mg via ORAL

## 2016-04-13 MED ORDER — SODIUM CHLORIDE 0.9 % IV SOLN
420.0000 mg | Freq: Once | INTRAVENOUS | Status: AC
  Administered 2016-04-13: 420 mg via INTRAVENOUS
  Filled 2016-04-13: qty 14

## 2016-04-13 MED ORDER — ACETAMINOPHEN 325 MG PO TABS
650.0000 mg | ORAL_TABLET | Freq: Once | ORAL | Status: AC
  Administered 2016-04-13: 650 mg via ORAL

## 2016-04-13 MED ORDER — SODIUM CHLORIDE 0.9 % IJ SOLN
10.0000 mL | INTRAMUSCULAR | Status: DC | PRN
  Administered 2016-04-13: 10 mL
  Filled 2016-04-13: qty 10

## 2016-04-13 MED ORDER — SODIUM CHLORIDE 0.9 % IV SOLN
Freq: Once | INTRAVENOUS | Status: AC
  Administered 2016-04-13: 10:00:00 via INTRAVENOUS

## 2016-04-13 MED ORDER — TRASTUZUMAB CHEMO 150 MG IV SOLR
6.0000 mg/kg | Freq: Once | INTRAVENOUS | Status: AC
  Administered 2016-04-13: 630 mg via INTRAVENOUS
  Filled 2016-04-13: qty 30

## 2016-04-13 MED ORDER — CLONIDINE HCL 0.1 MG PO TABS
ORAL_TABLET | ORAL | Status: AC
  Filled 2016-04-13: qty 3

## 2016-04-13 MED ORDER — DIPHENHYDRAMINE HCL 25 MG PO CAPS
ORAL_CAPSULE | ORAL | Status: AC
  Filled 2016-04-13: qty 1

## 2016-04-13 NOTE — Progress Notes (Signed)
Patient Bp assessed at 200/111. MD alerted. Per MD give 0.3mg  Clonidine oral and recheck in 20-30mins. If BP systolic under 217, proceed with treatment.   Wylene Simmer, BSN, RN 04/13/2016 10:14 AM

## 2016-04-29 ENCOUNTER — Other Ambulatory Visit: Payer: Self-pay | Admitting: Hematology and Oncology

## 2016-05-04 ENCOUNTER — Ambulatory Visit (HOSPITAL_BASED_OUTPATIENT_CLINIC_OR_DEPARTMENT_OTHER): Payer: PPO | Admitting: *Deleted

## 2016-05-04 ENCOUNTER — Ambulatory Visit (HOSPITAL_BASED_OUTPATIENT_CLINIC_OR_DEPARTMENT_OTHER): Payer: PPO

## 2016-05-04 ENCOUNTER — Encounter: Payer: Self-pay | Admitting: Hematology and Oncology

## 2016-05-04 ENCOUNTER — Ambulatory Visit (HOSPITAL_BASED_OUTPATIENT_CLINIC_OR_DEPARTMENT_OTHER): Payer: PPO | Admitting: Hematology and Oncology

## 2016-05-04 VITALS — BP 147/76

## 2016-05-04 DIAGNOSIS — C7800 Secondary malignant neoplasm of unspecified lung: Secondary | ICD-10-CM

## 2016-05-04 DIAGNOSIS — G620 Drug-induced polyneuropathy: Secondary | ICD-10-CM

## 2016-05-04 DIAGNOSIS — I10 Essential (primary) hypertension: Secondary | ICD-10-CM

## 2016-05-04 DIAGNOSIS — C7951 Secondary malignant neoplasm of bone: Secondary | ICD-10-CM

## 2016-05-04 DIAGNOSIS — Z5112 Encounter for antineoplastic immunotherapy: Secondary | ICD-10-CM | POA: Diagnosis not present

## 2016-05-04 DIAGNOSIS — C50411 Malignant neoplasm of upper-outer quadrant of right female breast: Secondary | ICD-10-CM

## 2016-05-04 DIAGNOSIS — C787 Secondary malignant neoplasm of liver and intrahepatic bile duct: Secondary | ICD-10-CM

## 2016-05-04 DIAGNOSIS — C778 Secondary and unspecified malignant neoplasm of lymph nodes of multiple regions: Secondary | ICD-10-CM

## 2016-05-04 DIAGNOSIS — Z171 Estrogen receptor negative status [ER-]: Secondary | ICD-10-CM | POA: Diagnosis not present

## 2016-05-04 LAB — COMPREHENSIVE METABOLIC PANEL
ALBUMIN: 3.5 g/dL (ref 3.5–5.0)
ALK PHOS: 81 U/L (ref 40–150)
ALT: 20 U/L (ref 0–55)
AST: 17 U/L (ref 5–34)
Anion Gap: 10 mEq/L (ref 3–11)
BILIRUBIN TOTAL: 0.43 mg/dL (ref 0.20–1.20)
BUN: 13.9 mg/dL (ref 7.0–26.0)
CALCIUM: 9.4 mg/dL (ref 8.4–10.4)
CO2: 25 mEq/L (ref 22–29)
CREATININE: 0.8 mg/dL (ref 0.6–1.1)
Chloride: 109 mEq/L (ref 98–109)
EGFR: 76 mL/min/{1.73_m2} — ABNORMAL LOW (ref >90)
Glucose: 150 mg/dl — ABNORMAL HIGH (ref 70–140)
POTASSIUM: 4 meq/L (ref 3.5–5.1)
Sodium: 144 mEq/L (ref 136–145)
TOTAL PROTEIN: 6.9 g/dL (ref 6.4–8.3)

## 2016-05-04 LAB — CBC WITH DIFFERENTIAL/PLATELET
BASO%: 0.3 % (ref 0.0–2.0)
BASOS ABS: 0 10*3/uL (ref 0.0–0.1)
EOS ABS: 0.1 10*3/uL (ref 0.0–0.5)
EOS%: 1.3 % (ref 0.0–7.0)
HCT: 46.6 % (ref 34.8–46.6)
HEMOGLOBIN: 15.2 g/dL (ref 11.6–15.9)
LYMPH#: 2.1 10*3/uL (ref 0.9–3.3)
LYMPH%: 28.4 % (ref 14.0–49.7)
MCH: 29.9 pg (ref 25.1–34.0)
MCHC: 32.6 g/dL (ref 31.5–36.0)
MCV: 91.6 fL (ref 79.5–101.0)
MONO#: 0.5 10*3/uL (ref 0.1–0.9)
MONO%: 6.4 % (ref 0.0–14.0)
NEUT#: 4.8 10*3/uL (ref 1.5–6.5)
NEUT%: 63.6 % (ref 38.4–76.8)
Platelets: 217 10*3/uL (ref 145–400)
RBC: 5.09 10*6/uL (ref 3.70–5.45)
RDW: 14.8 % — AB (ref 11.2–14.5)
WBC: 7.5 10*3/uL (ref 3.9–10.3)

## 2016-05-04 MED ORDER — DIPHENHYDRAMINE HCL 25 MG PO CAPS
ORAL_CAPSULE | ORAL | Status: AC
  Filled 2016-05-04: qty 1

## 2016-05-04 MED ORDER — HEPARIN SOD (PORK) LOCK FLUSH 100 UNIT/ML IV SOLN
500.0000 [IU] | Freq: Once | INTRAVENOUS | Status: AC | PRN
  Administered 2016-05-04: 500 [IU]
  Filled 2016-05-04: qty 5

## 2016-05-04 MED ORDER — SODIUM CHLORIDE 0.9 % IJ SOLN
10.0000 mL | INTRAMUSCULAR | Status: DC | PRN
  Administered 2016-05-04: 10 mL
  Filled 2016-05-04: qty 10

## 2016-05-04 MED ORDER — ACETAMINOPHEN 325 MG PO TABS
ORAL_TABLET | ORAL | Status: AC
  Filled 2016-05-04: qty 2

## 2016-05-04 MED ORDER — DIPHENHYDRAMINE HCL 25 MG PO CAPS
25.0000 mg | ORAL_CAPSULE | Freq: Once | ORAL | Status: AC
  Administered 2016-05-04: 25 mg via ORAL

## 2016-05-04 MED ORDER — SODIUM CHLORIDE 0.9 % IV SOLN
Freq: Once | INTRAVENOUS | Status: AC
  Administered 2016-05-04: 10:00:00 via INTRAVENOUS

## 2016-05-04 MED ORDER — CLONIDINE HCL 0.1 MG PO TABS
0.3000 mg | ORAL_TABLET | Freq: Once | ORAL | Status: AC
  Administered 2016-05-04: 0.3 mg via ORAL

## 2016-05-04 MED ORDER — CLONIDINE HCL 0.1 MG PO TABS
ORAL_TABLET | ORAL | Status: AC
  Filled 2016-05-04: qty 3

## 2016-05-04 MED ORDER — TRASTUZUMAB CHEMO 150 MG IV SOLR
6.0000 mg/kg | Freq: Once | INTRAVENOUS | Status: AC
  Administered 2016-05-04: 630 mg via INTRAVENOUS
  Filled 2016-05-04: qty 30

## 2016-05-04 MED ORDER — ACETAMINOPHEN 325 MG PO TABS
650.0000 mg | ORAL_TABLET | Freq: Once | ORAL | Status: AC
  Administered 2016-05-04: 650 mg via ORAL

## 2016-05-04 MED ORDER — SODIUM CHLORIDE 0.9 % IV SOLN
420.0000 mg | Freq: Once | INTRAVENOUS | Status: AC
  Administered 2016-05-04: 420 mg via INTRAVENOUS
  Filled 2016-05-04: qty 14

## 2016-05-04 NOTE — Progress Notes (Signed)
Per Dr Lindi Adie ok to proceed with treatment with BP 169/84

## 2016-05-04 NOTE — Patient Instructions (Signed)
[facility] Discharge Instructions for Patients Receiving Chemotherapy  Today you received the following chemotherapy agents herceptin/perjeta  To help prevent nausea and vomiting after your treatment, we encourage you to take your nausea medication as directed   If you develop nausea and vomiting that is not controlled by your nausea medication, call the clinic.   BELOW ARE SYMPTOMS THAT SHOULD BE REPORTED IMMEDIATELY:  *FEVER GREATER THAN 100.5 F  *CHILLS WITH OR WITHOUT FEVER  NAUSEA AND VOMITING THAT IS NOT CONTROLLED WITH YOUR NAUSEA MEDICATION  *UNUSUAL SHORTNESS OF BREATH  *UNUSUAL BRUISING OR BLEEDING  TENDERNESS IN MOUTH AND THROAT WITH OR WITHOUT PRESENCE OF ULCERS  *URINARY PROBLEMS  *BOWEL PROBLEMS  UNUSUAL RASH Items with * indicate a potential emergency and should be followed up as soon as possible.  Feel free to call the clinic you have any questions or concerns. The clinic phone number is [phone].  

## 2016-05-04 NOTE — Progress Notes (Signed)
Patient Care Team: Seward Carol, MD as PCP - General (Internal Medicine)  DIAGNOSIS:  Encounter Diagnosis  Name Primary?  . Malignant neoplasm of upper-outer quadrant of right breast in female, estrogen receptor negative ([place])     SUMMARY OF ONCOLOGIC HISTORY:   Breast cancer of upper-outer quadrant of right female breast ([place])   08/15/2014 Initial Diagnosis    Right Breast Inflammatory invasive ductal cancer: 17 cm by ultrasound along with 13 cm ulceration, node also positive T4N1 (stage 3C) ER 0%, PR 0%, Her-2 Pending      08/22/2014 PET scan    Metastatic disease. In addition to Right breast, hypermetabolic disease in Rt [address] LN, axilla and mediastinum, bulky disease in liver, Pulm Mets, Path fracture 9th rib      09/03/2014 - 11/26/2014 Chemotherapy    Herceptin Perjeta started 09/03/2014, Taxol weekly added 09/10/2014      10/17/2014 Imaging    CT scans: Decrease in right axillary lymph node, decreased skin thickening of the right breast, decreased in bilateral pulmonary nodules, decrease in liver metastases      12/09/2014 PET scan    Remarkable response to chemotherapy resolution of liver metastases, marked improvement in the tumor in the breast, marked improvement in right axillary lymph nodes, resolution of lung nodules, improvement in left sixth rib      12/17/2014 -  Chemotherapy    Herceptin and Perjeta maintenance every 3 weeks       CHIEF COMPLIANT: Follow-up on Herceptin and Perjeta  INTERVAL HISTORY: Denise Morton is a 66 year old with above-mentioned history metastatic breast cancer who has been on Herceptin and Perjeta maintenance and appears to be tolerating it very well. She had a scan in March which did not show any evidence of liver metastases. She has noticed that her blood pressure has been increasing and with the treatment she has required clonidine. She has not yet reached out her primary care physician regarding this. She exercises on recumbent  bicycle at home 10 minutes every day.  REVIEW OF SYSTEMS:   Constitutional: Denies fevers, chills or abnormal weight loss Eyes: Denies blurriness of vision Ears, nose, mouth, throat, and face: Denies mucositis or sore throat Respiratory: Denies cough, dyspnea or wheezes Cardiovascular: Denies palpitation, chest discomfort Gastrointestinal:  Denies nausea, heartburn or change in bowel habits Skin: Denies abnormal skin rashes Lymphatics: Denies new lymphadenopathy or easy bruising Neurological:Denies numbness, tingling or new weaknesses Behavioral/Psych: Mood is stable, no new changes  Extremities: No lower extremity edema Breast:  denies any pain or lumps or nodules in either breasts All other systems were reviewed with the patient and are negative.  I have reviewed the past medical history, past surgical history, social history and family history with the patient and they are unchanged from previous note.  ALLERGIES:  is allergic to penicillin g and sulfa antibiotics.  MEDICATIONS:  Current Outpatient Prescriptions  Medication Sig Dispense Refill  . ALPRAZolam (XANAX) 0.25 MG tablet Take 0.25 mg by mouth daily as needed for anxiety.    . carvedilol (COREG) 12.5 MG tablet Take 1 tablet (12.5 mg total) by mouth 2 (two) times daily with a meal. 60 tablet 0  . diphenoxylate-atropine (LOMOTIL) 2.5-0.025 MG tablet Take 1 tablet by mouth 3 (three) times daily as needed for diarrhea or loose stools. 60 tablet 3  . gabapentin (NEURONTIN) 300 MG capsule TAKE ONE CAPSULE BY MOUTH THREE TIMES DAILY 90 capsule 6  . lidocaine-prilocaine (EMLA) cream APPLY TO THE AFFECTED AREA ONCE AS DIRECTED 30  g 0  . loperamide (IMODIUM) 2 MG capsule Take 2 mg by mouth as needed for diarrhea or loose stools.    Marland Kitchen losartan-hydrochlorothiazide (HYZAAR) 50-12.5 MG tablet Take 1 tablet by mouth daily.    . metFORMIN (GLUMETZA) 500 MG (MOD) 24 hr tablet Take 1 tablet (500 mg total) by mouth daily with breakfast.    .  miconazole (MICOTIN) 2 % cream Apply 1 application topically 2 (two) times daily. 28.35 g 0  . Multiple Vitamin (MULTIVITAMIN WITH MINERALS) TABS tablet Take 1 tablet by mouth daily.    . nicotine polacrilex (COMMIT) 2 MG lozenge Take 2 mg by mouth as needed for smoking cessation.    . ondansetron (ZOFRAN) 8 MG tablet TAKE ONE TABLET BY MOUTH TWICE DAILY. START THE DAY AFTER CHEMO FOR TWO DAYS. THEN TAKE AS NEEDED FOR NAUSEA OR FOR VOMITING  1  . prochlorperazine (COMPAZINE) 10 MG tablet TAKE ONE TABLET BY MOUTH EVERY 6 HOURS AS NEEDED FOR NAUSEA OR FOR VOMITING  1   No current facility-administered medications for this visit.    Facility-Administered Medications Ordered in Other Visits  Medication Dose Route Frequency Provider Last Rate Last Dose  . sodium chloride 0.9 % injection 10 mL  10 mL Intracatheter PRN Nicholas Lose, MD   10 mL at 02/18/15 1111    PHYSICAL EXAMINATION: ECOG PERFORMANCE STATUS: 1 - Symptomatic but completely ambulatory  Vitals:   05/04/16 0917  BP: (!) 197/97  Pulse: 62  Resp: 18  Temp: 97.7 F (36.5 C)   Filed Weights   05/04/16 0917  Weight: 230 lb 12.8 oz (104.7 kg)    GENERAL:alert, no distress and comfortable SKIN: skin color, texture, turgor are normal, no rashes or significant lesions EYES: normal, Conjunctiva are pink and non-injected, sclera clear OROPHARYNX:no exudate, no erythema and lips, buccal mucosa, and tongue normal  NECK: supple, thyroid normal size, non-tender, without nodularity LYMPH:  no palpable lymphadenopathy in the cervical, axillary or inguinal LUNGS: clear to auscultation and percussion with normal breathing effort HEART: regular rate & rhythm and no murmurs and no lower extremity edema ABDOMEN:abdomen soft, non-tender and normal bowel sounds MUSCULOSKELETAL:no cyanosis of digits and no clubbing  NEURO: alert & oriented x 3 with fluent speech, no focal motor/sensory deficits EXTREMITIES: No lower extremity  edema   LABORATORY DATA:  I have reviewed the data as listed   Chemistry      Component Value Date/Time   NA 144 05/04/2016 0851   K 4.0 05/04/2016 0851   CL 108 12/03/2006 1854   CO2 25 05/04/2016 0851   BUN 13.9 05/04/2016 0851   CREATININE 0.8 05/04/2016 0851      Component Value Date/Time   CALCIUM 9.4 05/04/2016 0851   ALKPHOS 81 05/04/2016 0851   AST 17 05/04/2016 0851   ALT 20 05/04/2016 0851   BILITOT 0.43 05/04/2016 0851       Lab Results  Component Value Date   WBC 7.5 05/04/2016   HGB 15.2 05/04/2016   HCT 46.6 05/04/2016   MCV 91.6 05/04/2016   PLT 217 05/04/2016   NEUTROABS 4.8 05/04/2016    ASSESSMENT & PLAN:  Breast cancer of 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 In addition to Right breast, hypermetabolic disease in Rt [address]lav LN, axilla and mediastinum, bulky  disease in liver, Pulm Mets, Path fracture 9th rib   Treatment summary:Taxol weekly(started 09/10/2014 )Herceptin Perjeta every 3 weeks(started 09/03/2014) Received 10 Weeks Taxol, Herceptin Perjeta given once every 3 weeks (8/17 and 9/7 and 9/28, 10/19) ; changed chemo from Taxol to Abraxane for bronchospasm 11/19/14 and completed 12/03/14   PET CT scan 12/09/2014: Complete response in the liver, marked improvement in metastatic disease in the right breast and right axilla, improvement in bone metastases, decrease in lymphadenopathy throughout. Resolution of lung nodules. CT CAP 03/09/15: No new lung mets, stable ovoid nodule in post lateral right breast, No evidence of met disease in abd, low density lesions in liver are unchanged. CT CAP 06/01/2015:no evidence of metastatic disease in the liver with continued complete response to  treatment. --------------------------------------------------------------------------------------------------------------------------------------------------------- Current Treatment: Herceptin Perjeta maintenance started 12/17/14 Chemotherapy toxicities: 1. 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 I increased the Neurontin to 300 by mouth 3 times a day on 08/05/2015  Plan: Continue Herceptin Perjeta every 3 weeks Changed to every 4 weeks 05/04/2016 Patient last had a mammogram and colonoscopy with her primary care physician. [facility] gets her mammograms at [facility]. Hypertension: Being managed by Dr. Delfina Redwood. Not very well controlled today.  CT chest abdomen pelvis 04/12/2016: No evidence of metastatic disease Additional scans will be performed in 6 months I would like to change her treatment to every 4 weeks. Return to clinic every4 weeks for Herceptin and Perjeta and in 8 weeks for clinic follow-up after scans. Labs every 8 weeks forfollow-up  I spent 25 minutes talking to the patient of which more than half was spent in counseling and coordination of care.  No orders of the defined types were placed in this encounter.  The patient has a good understanding of the overall plan. she agrees with it. she will call with any problems that may develop before the next visit here.   Rulon Eisenmenger, MD 05/04/16

## 2016-05-04 NOTE — Assessment & Plan Note (Signed)
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 In addition to Right breast, hypermetabolic disease in Rt [address] LN, axilla and mediastinum, bulky disease in liver, Pulm Mets, Path fracture 9th rib   Treatment summary:Taxol weekly(started 09/10/2014 )Herceptin Perjeta every 3 weeks(started 09/03/2014) Received 10 Weeks Taxol, Herceptin Perjeta given once every 3 weeks (8/17 and 9/7 and 9/28, 10/19) ; changed chemo from Taxol to Abraxane for bronchospasm 11/19/14 and completed 12/03/14   PET CT scan 12/09/2014: Complete response in the liver, marked improvement in metastatic disease in the right breast and right axilla, improvement in bone metastases, decrease in lymphadenopathy throughout. Resolution of lung nodules. CT CAP 03/09/15: No new lung mets, stable ovoid nodule in post lateral right breast, No evidence of met disease in abd, low density lesions in liver are unchanged. CT CAP 06/01/2015:no evidence of metastatic disease in the liver with continued complete response to treatment. --------------------------------------------------------------------------------------------------------------------------------------------------------- Current Treatment: Herceptin Perjeta maintenance started 12/17/14 Chemotherapy toxicities: 1. 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 I increased the Neurontin to 300 by mouth 3 times a day on 08/05/2015  Plan: Continue Herceptin Perjeta every 3 weeks Patient last had a mammogram and colonoscopy with her primary care physician. She gets her mammograms at [facility]. Hypertension: Being managed by Dr. Delfina Redwood. Not very well controlled today.  CT chest abdomen pelvis 04/12/2016: No evidence of metastatic disease Additional scans  will be performed in 6 months  Return to clinic every3 weeks for Herceptin and Perjeta and in 9 weeks for clinic follow-up after scans. Labs every 9 weeks forfollow-up

## 2016-05-04 NOTE — Progress Notes (Signed)
Pt refused post obs period for perjeta

## 2016-05-19 ENCOUNTER — Telehealth: Payer: Self-pay | Admitting: Hematology and Oncology

## 2016-05-19 NOTE — Telephone Encounter (Signed)
Patient would like to move her 5/16 appointment to either 5/15 or 5/17 if possible. Please call and let her know about the appointments

## 2016-06-01 ENCOUNTER — Ambulatory Visit: Payer: PPO

## 2016-06-02 ENCOUNTER — Ambulatory Visit (HOSPITAL_BASED_OUTPATIENT_CLINIC_OR_DEPARTMENT_OTHER): Payer: PPO

## 2016-06-02 VITALS — BP 189/93 | HR 60 | Temp 97.8°F | Resp 18

## 2016-06-02 DIAGNOSIS — C778 Secondary and unspecified malignant neoplasm of lymph nodes of multiple regions: Secondary | ICD-10-CM | POA: Diagnosis not present

## 2016-06-02 DIAGNOSIS — C50411 Malignant neoplasm of upper-outer quadrant of right female breast: Secondary | ICD-10-CM

## 2016-06-02 DIAGNOSIS — Z5112 Encounter for antineoplastic immunotherapy: Secondary | ICD-10-CM | POA: Diagnosis not present

## 2016-06-02 DIAGNOSIS — C7800 Secondary malignant neoplasm of unspecified lung: Secondary | ICD-10-CM

## 2016-06-02 DIAGNOSIS — C787 Secondary malignant neoplasm of liver and intrahepatic bile duct: Secondary | ICD-10-CM | POA: Diagnosis not present

## 2016-06-02 DIAGNOSIS — C7951 Secondary malignant neoplasm of bone: Secondary | ICD-10-CM

## 2016-06-02 MED ORDER — ACETAMINOPHEN 325 MG PO TABS
650.0000 mg | ORAL_TABLET | Freq: Once | ORAL | Status: AC
  Administered 2016-06-02: 650 mg via ORAL

## 2016-06-02 MED ORDER — DIPHENHYDRAMINE HCL 25 MG PO CAPS
25.0000 mg | ORAL_CAPSULE | Freq: Once | ORAL | Status: AC
  Administered 2016-06-02: 25 mg via ORAL

## 2016-06-02 MED ORDER — PERTUZUMAB CHEMO INJECTION 420 MG/14ML
420.0000 mg | Freq: Once | INTRAVENOUS | Status: AC
  Administered 2016-06-02: 420 mg via INTRAVENOUS
  Filled 2016-06-02: qty 14

## 2016-06-02 MED ORDER — ACETAMINOPHEN 325 MG PO TABS
ORAL_TABLET | ORAL | Status: AC
  Filled 2016-06-02: qty 2

## 2016-06-02 MED ORDER — DIPHENHYDRAMINE HCL 25 MG PO CAPS
ORAL_CAPSULE | ORAL | Status: AC
  Filled 2016-06-02: qty 1

## 2016-06-02 MED ORDER — SODIUM CHLORIDE 0.9 % IV SOLN
Freq: Once | INTRAVENOUS | Status: AC
  Administered 2016-06-02: 08:00:00 via INTRAVENOUS

## 2016-06-02 MED ORDER — TRASTUZUMAB CHEMO 150 MG IV SOLR
6.0000 mg/kg | Freq: Once | INTRAVENOUS | Status: AC
  Administered 2016-06-02: 630 mg via INTRAVENOUS
  Filled 2016-06-02: qty 30

## 2016-06-02 MED ORDER — HEPARIN SOD (PORK) LOCK FLUSH 100 UNIT/ML IV SOLN
500.0000 [IU] | Freq: Once | INTRAVENOUS | Status: AC | PRN
  Administered 2016-06-02: 500 [IU]
  Filled 2016-06-02: qty 5

## 2016-06-02 MED ORDER — SODIUM CHLORIDE 0.9 % IJ SOLN
10.0000 mL | INTRAMUSCULAR | Status: DC | PRN
  Administered 2016-06-02: 10 mL
  Filled 2016-06-02: qty 10

## 2016-06-02 NOTE — Patient Instructions (Signed)
[facility] Discharge Instructions for Patients Receiving Chemotherapy  Today you received the following chemotherapy agents Herceptin/Perjeta To help prevent nausea and vomiting after your treatment, we encourage you to take your nausea medication as prescribed.   If you develop nausea and vomiting that is not controlled by your nausea medication, call the clinic.   BELOW ARE SYMPTOMS THAT SHOULD BE REPORTED IMMEDIATELY:  *FEVER GREATER THAN 100.5 F  *CHILLS WITH OR WITHOUT FEVER  NAUSEA AND VOMITING THAT IS NOT CONTROLLED WITH YOUR NAUSEA MEDICATION  *UNUSUAL SHORTNESS OF BREATH  *UNUSUAL BRUISING OR BLEEDING  TENDERNESS IN MOUTH AND THROAT WITH OR WITHOUT PRESENCE OF ULCERS  *URINARY PROBLEMS  *BOWEL PROBLEMS  UNUSUAL RASH Items with * indicate a potential emergency and should be followed up as soon as possible.  Feel free to call the clinic you have any questions or concerns. The clinic phone number is [phone].  Please show the CHEMO ALERT CARD at check-in to the Emergency Department and triage nurse.  

## 2016-06-02 NOTE — Progress Notes (Signed)
Patient blood pressure elevated. Patient denies dizziness, headache, visual changes or any other concerns today. Patient states she took her dose of blood pressure medication this am. However, patient states she is going to follow up with her PCP regarding her elevated blood pressure.

## 2016-06-28 ENCOUNTER — Other Ambulatory Visit: Payer: Self-pay

## 2016-06-28 DIAGNOSIS — Z171 Estrogen receptor negative status [ER-]: Principal | ICD-10-CM

## 2016-06-28 DIAGNOSIS — C50411 Malignant neoplasm of upper-outer quadrant of right female breast: Secondary | ICD-10-CM

## 2016-06-28 NOTE — Assessment & Plan Note (Signed)
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 In addition to Right breast, hypermetabolic disease in Rt [address] LN, axilla and mediastinum, bulky disease in liver, Pulm Mets, Path fracture 9th rib   Treatment summary:Taxol weekly(started 09/10/2014 )Herceptin Perjeta every 3 weeks(started 09/03/2014) Received 10 Weeks Taxol, Herceptin Perjeta given once every 3 weeks (8/17 and 9/7 and 9/28, 10/19) ; changed chemo from Taxol to Abraxane for bronchospasm 11/19/14 and completed 12/03/14   PET CT scan 12/09/2014: Complete response in the liver, marked improvement in metastatic disease in the right breast and right axilla, improvement in bone metastases, decrease in lymphadenopathy throughout. Resolution of lung nodules. CT CAP 03/09/15: No new lung mets, stable ovoid nodule in post lateral right breast, No evidence of met disease in abd, low density lesions in liver are unchanged. CT CAP 06/01/2015:no evidence of metastatic disease in the liver with continued complete response to treatment. --------------------------------------------------------------------------------------------------------------------------------------------------------- Current Treatment: Herceptin Perjeta maintenance started 12/17/14 Chemotherapy toxicities: 1. 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 I increased the Neurontin to 300 by mouth 3 times a day on 08/05/2015  Plan: Continue Herceptin Perjeta every 3 weeks Changed to every 4 weeks 05/04/2016 Patient last had a mammogram and colonoscopy with her primary care physician. She gets her mammograms at [facility]. Hypertension: Being managed by Dr. Polite. Not very well controlled today.  CT chest abdomen pelvis 04/12/2016: No evidence of  metastatic disease Additional scanswill beperformed in Sept 2018  Return to clinic every4 weeks for Herceptin and Perjeta and in 8 weeks for clinic follow-up after scans. Labs every 8 weeks forfollow-up 

## 2016-06-29 ENCOUNTER — Encounter: Payer: Self-pay | Admitting: Hematology and Oncology

## 2016-06-29 ENCOUNTER — Ambulatory Visit (HOSPITAL_BASED_OUTPATIENT_CLINIC_OR_DEPARTMENT_OTHER): Payer: PPO

## 2016-06-29 ENCOUNTER — Other Ambulatory Visit (HOSPITAL_BASED_OUTPATIENT_CLINIC_OR_DEPARTMENT_OTHER): Payer: PPO

## 2016-06-29 ENCOUNTER — Ambulatory Visit (HOSPITAL_BASED_OUTPATIENT_CLINIC_OR_DEPARTMENT_OTHER): Payer: PPO | Admitting: Hematology and Oncology

## 2016-06-29 DIAGNOSIS — C50411 Malignant neoplasm of upper-outer quadrant of right female breast: Secondary | ICD-10-CM

## 2016-06-29 DIAGNOSIS — Z171 Estrogen receptor negative status [ER-]: Principal | ICD-10-CM

## 2016-06-29 DIAGNOSIS — C787 Secondary malignant neoplasm of liver and intrahepatic bile duct: Secondary | ICD-10-CM

## 2016-06-29 DIAGNOSIS — C7800 Secondary malignant neoplasm of unspecified lung: Secondary | ICD-10-CM

## 2016-06-29 DIAGNOSIS — C7951 Secondary malignant neoplasm of bone: Secondary | ICD-10-CM

## 2016-06-29 DIAGNOSIS — C778 Secondary and unspecified malignant neoplasm of lymph nodes of multiple regions: Secondary | ICD-10-CM | POA: Diagnosis not present

## 2016-06-29 DIAGNOSIS — Z5112 Encounter for antineoplastic immunotherapy: Secondary | ICD-10-CM | POA: Diagnosis not present

## 2016-06-29 DIAGNOSIS — G620 Drug-induced polyneuropathy: Secondary | ICD-10-CM

## 2016-06-29 DIAGNOSIS — I10 Essential (primary) hypertension: Secondary | ICD-10-CM | POA: Diagnosis not present

## 2016-06-29 LAB — CBC WITH DIFFERENTIAL/PLATELET
BASO%: 0.5 % (ref 0.0–2.0)
Basophils Absolute: 0 10*3/uL (ref 0.0–0.1)
EOS%: 1 % (ref 0.0–7.0)
Eosinophils Absolute: 0.1 10*3/uL (ref 0.0–0.5)
HEMATOCRIT: 46.8 % — AB (ref 34.8–46.6)
HEMOGLOBIN: 15.2 g/dL (ref 11.6–15.9)
LYMPH#: 2 10*3/uL (ref 0.9–3.3)
LYMPH%: 32.5 % (ref 14.0–49.7)
MCH: 30.6 pg (ref 25.1–34.0)
MCHC: 32.5 g/dL (ref 31.5–36.0)
MCV: 94.4 fL (ref 79.5–101.0)
MONO#: 0.4 10*3/uL (ref 0.1–0.9)
MONO%: 6 % (ref 0.0–14.0)
NEUT%: 60 % (ref 38.4–76.8)
NEUTROS ABS: 3.7 10*3/uL (ref 1.5–6.5)
PLATELETS: 199 10*3/uL (ref 145–400)
RBC: 4.96 10*6/uL (ref 3.70–5.45)
RDW: 14.7 % — AB (ref 11.2–14.5)
WBC: 6.2 10*3/uL (ref 3.9–10.3)

## 2016-06-29 LAB — COMPREHENSIVE METABOLIC PANEL
ALT: 18 U/L (ref 0–55)
ANION GAP: 9 meq/L (ref 3–11)
AST: 16 U/L (ref 5–34)
Albumin: 3.5 g/dL (ref 3.5–5.0)
Alkaline Phosphatase: 84 U/L (ref 40–150)
BILIRUBIN TOTAL: 0.67 mg/dL (ref 0.20–1.20)
BUN: 16 mg/dL (ref 7.0–26.0)
CALCIUM: 9.8 mg/dL (ref 8.4–10.4)
CO2: 24 meq/L (ref 22–29)
CREATININE: 0.8 mg/dL (ref 0.6–1.1)
Chloride: 110 mEq/L — ABNORMAL HIGH (ref 98–109)
EGFR: 76 mL/min/{1.73_m2} — ABNORMAL LOW (ref >90)
Glucose: 152 mg/dl — ABNORMAL HIGH (ref 70–140)
Potassium: 4.2 mEq/L (ref 3.5–5.1)
Sodium: 144 mEq/L (ref 136–145)
TOTAL PROTEIN: 6.7 g/dL (ref 6.4–8.3)

## 2016-06-29 MED ORDER — DIPHENHYDRAMINE HCL 25 MG PO CAPS
25.0000 mg | ORAL_CAPSULE | Freq: Once | ORAL | Status: AC
  Administered 2016-06-29: 25 mg via ORAL

## 2016-06-29 MED ORDER — SODIUM CHLORIDE 0.9 % IJ SOLN
10.0000 mL | INTRAMUSCULAR | Status: DC | PRN
  Administered 2016-06-29: 10 mL
  Filled 2016-06-29: qty 10

## 2016-06-29 MED ORDER — TRASTUZUMAB CHEMO 150 MG IV SOLR
6.0000 mg/kg | Freq: Once | INTRAVENOUS | Status: AC
  Administered 2016-06-29: 630 mg via INTRAVENOUS
  Filled 2016-06-29: qty 30

## 2016-06-29 MED ORDER — ACETAMINOPHEN 325 MG PO TABS
650.0000 mg | ORAL_TABLET | Freq: Once | ORAL | Status: AC
  Administered 2016-06-29: 650 mg via ORAL

## 2016-06-29 MED ORDER — ACETAMINOPHEN 325 MG PO TABS
ORAL_TABLET | ORAL | Status: AC
  Filled 2016-06-29: qty 2

## 2016-06-29 MED ORDER — HEPARIN SOD (PORK) LOCK FLUSH 100 UNIT/ML IV SOLN
500.0000 [IU] | Freq: Once | INTRAVENOUS | Status: AC | PRN
  Administered 2016-06-29: 500 [IU]
  Filled 2016-06-29: qty 5

## 2016-06-29 MED ORDER — SODIUM CHLORIDE 0.9 % IV SOLN
420.0000 mg | Freq: Once | INTRAVENOUS | Status: AC
  Administered 2016-06-29: 420 mg via INTRAVENOUS
  Filled 2016-06-29: qty 14

## 2016-06-29 MED ORDER — SODIUM CHLORIDE 0.9 % IV SOLN
Freq: Once | INTRAVENOUS | Status: AC
  Administered 2016-06-29: 09:00:00 via INTRAVENOUS

## 2016-06-29 MED ORDER — DIPHENHYDRAMINE HCL 25 MG PO CAPS
ORAL_CAPSULE | ORAL | Status: AC
  Filled 2016-06-29: qty 1

## 2016-06-29 NOTE — Progress Notes (Signed)
Patient Care Team: Seward Carol, MD as PCP - General (Internal Medicine)  DIAGNOSIS:  Encounter Diagnosis  Name Primary?  . Malignant neoplasm of upper-outer quadrant of right breast in female, estrogen receptor negative ([place])     SUMMARY OF ONCOLOGIC HISTORY:   Breast cancer of upper-outer quadrant of right female breast ([place])   08/15/2014 Initial Diagnosis    Right Breast Inflammatory invasive ductal cancer: 17 cm by ultrasound along with 13 cm ulceration, node also positive T4N1 (stage 3C) ER 0%, PR 0%, Her-2 Pending      08/22/2014 PET scan    Metastatic disease. In addition to Right breast, hypermetabolic disease in Rt [address], axilla and mediastinum, bulky disease in liver, Pulm Mets, Path fracture 9th rib      09/03/2014 - 11/26/2014 Chemotherapy    Herceptin Perjeta started 09/03/2014, Taxol weekly added 09/10/2014      10/17/2014 Imaging    CT scans: Decrease in right axillary lymph node, decreased skin thickening of the right breast, decreased in bilateral pulmonary nodules, decrease in liver metastases      12/09/2014 PET scan    Remarkable response to chemotherapy resolution of liver metastases, marked improvement in the tumor in the breast, marked improvement in right axillary lymph nodes, resolution of lung nodules, improvement in left sixth rib      12/17/2014 -  Chemotherapy    Herceptin and Perjeta maintenance every 3 weeks       CHIEF COMPLIANT: Follow-up of Herceptin and Perjeta maintenance  INTERVAL HISTORY: Denise Morton is a 66 year old with metastatic breast cancer currently on Herceptin Perjeta maintenance. She does have occasional diarrhea from Perjeta but otherwise doing quite well. Denies any new pain or discomfort. She wants to know how long she can remain on Herceptin and Perjeta. Energy levels are excellent. She has gained some weight because of recent dietary changes. She wishes to lose this weight.  REVIEW OF SYSTEMS:   Constitutional:  Denies fevers, chills or abnormal weight loss Eyes: Denies blurriness of vision Ears, nose, mouth, throat, and face: Denies mucositis or sore throat Respiratory: Denies cough, dyspnea or wheezes Cardiovascular: Denies palpitation, chest discomfort Gastrointestinal:  Denies nausea, heartburn or change in bowel habits Skin: Denies abnormal skin rashes Lymphatics: Denies new lymphadenopathy or easy bruising Neurological:Denies numbness, tingling or new weaknesses Behavioral/Psych: Mood is stable, no new changes  Extremities: No lower extremity edema Breast:  denies any pain or lumps or nodules in either breasts All other systems were reviewed with the patient and are negative.  I have reviewed the past medical history, past surgical history, social history and family history with the patient and they are unchanged from previous note.  ALLERGIES:  is allergic to penicillin g and sulfa antibiotics.  MEDICATIONS:  Current Outpatient Prescriptions  Medication Sig Dispense Refill  . ALPRAZolam (XANAX) 0.25 MG tablet Take 0.25 mg by mouth daily as needed for anxiety.    . carvedilol (COREG) 12.5 MG tablet Take 1 tablet (12.5 mg total) by mouth 2 (two) times daily with a meal. 60 tablet 0  . diphenoxylate-atropine (LOMOTIL) 2.5-0.025 MG tablet Take 1 tablet by mouth 3 (three) times daily as needed for diarrhea or loose stools. 60 tablet 3  . gabapentin (NEURONTIN) 300 MG capsule TAKE ONE CAPSULE BY MOUTH THREE TIMES DAILY 90 capsule 6  . lidocaine-prilocaine (EMLA) cream APPLY TO THE AFFECTED AREA ONCE AS DIRECTED 30 g 0  . loperamide (IMODIUM) 2 MG capsule Take 2 mg by mouth as needed for  diarrhea or loose stools.    Marland Kitchen losartan-hydrochlorothiazide (HYZAAR) 50-12.5 MG tablet Take 1 tablet by mouth daily.    . metFORMIN (GLUMETZA) 500 MG (MOD) 24 hr tablet Take 1 tablet (500 mg total) by mouth daily with breakfast.    . miconazole (MICOTIN) 2 % cream Apply 1 application topically 2 (two) times  daily. 28.35 g 0  . Multiple Vitamin (MULTIVITAMIN WITH MINERALS) TABS tablet Take 1 tablet by mouth daily.    . nicotine polacrilex (COMMIT) 2 MG lozenge Take 2 mg by mouth as needed for smoking cessation.    . ondansetron (ZOFRAN) 8 MG tablet TAKE ONE TABLET BY MOUTH TWICE DAILY. START THE DAY AFTER CHEMO FOR TWO DAYS. THEN TAKE AS NEEDED FOR NAUSEA OR FOR VOMITING  1  . prochlorperazine (COMPAZINE) 10 MG tablet TAKE ONE TABLET BY MOUTH EVERY 6 HOURS AS NEEDED FOR NAUSEA OR FOR VOMITING  1   No current facility-administered medications for this visit.    Facility-Administered Medications Ordered in Other Visits  Medication Dose Route Frequency Provider Last Rate Last Dose  . sodium chloride 0.9 % injection 10 mL  10 mL Intracatheter PRN Nicholas Lose, MD   10 mL at 02/18/15 1111    PHYSICAL EXAMINATION: ECOG PERFORMANCE STATUS: 1 - Symptomatic but completely ambulatory  Vitals:   06/29/16 0810  BP: (!) 179/104  Pulse: 61  Resp: 17  Temp: 98.2 F (36.8 C)   Filed Weights   06/29/16 0810  Weight: 235 lb 1.6 oz (106.6 kg)    GENERAL:alert, no distress and comfortable SKIN: skin color, texture, turgor are normal, no rashes or significant lesions EYES: normal, Conjunctiva are pink and non-injected, sclera clear OROPHARYNX:no exudate, no erythema and lips, buccal mucosa, and tongue normal  NECK: supple, thyroid normal size, non-tender, without nodularity LYMPH:  no palpable lymphadenopathy in the cervical, axillary or inguinal LUNGS: clear to auscultation and percussion with normal breathing effort HEART: regular rate & rhythm and no murmurs and no lower extremity edema ABDOMEN:abdomen soft, non-tender and normal bowel sounds MUSCULOSKELETAL:no cyanosis of digits and no clubbing  NEURO: alert & oriented x 3 with fluent speech, no focal motor/sensory deficits EXTREMITIES: No lower extremity edema  LABORATORY DATA:  I have reviewed the data as listed   Chemistry      Component  Value Date/Time   NA 144 05/04/2016 0851   K 4.0 05/04/2016 0851   CL 108 12/03/2006 1854   CO2 25 05/04/2016 0851   BUN 13.9 05/04/2016 0851   CREATININE 0.8 05/04/2016 0851      Component Value Date/Time   CALCIUM 9.4 05/04/2016 0851   ALKPHOS 81 05/04/2016 0851   AST 17 05/04/2016 0851   ALT 20 05/04/2016 0851   BILITOT 0.43 05/04/2016 0851       Lab Results  Component Value Date   WBC 6.2 06/29/2016   HGB 15.2 06/29/2016   HCT 46.8 (H) 06/29/2016   MCV 94.4 06/29/2016   PLT 199 06/29/2016   NEUTROABS 3.7 06/29/2016    ASSESSMENT & PLAN:  Breast cancer of 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 In addition to Right breast, hypermetabolic disease in Rt [address] LN, axilla and mediastinum, bulky disease in liver, Pulm Mets, Path fracture 9th rib   Treatment summary:Taxol weekly(started 09/10/2014 )Herceptin Perjeta  every 3 weeks(started 09/03/2014) Received 10 Weeks Taxol, Herceptin Perjeta given once every 3 weeks (8/17 and 9/7 and 9/28, 10/19) ; changed chemo from Taxol to Abraxane for bronchospasm 11/19/14 and completed 12/03/14   PET CT scan 12/09/2014: Complete response in the liver, marked improvement in metastatic disease in the right breast and right axilla, improvement in bone metastases, decrease in lymphadenopathy throughout. Resolution of lung nodules. CT CAP 03/09/15: No new lung mets, stable ovoid nodule in post lateral right breast, No evidence of met disease in abd, low density lesions in liver are unchanged. CT CAP 06/01/2015:no evidence of metastatic disease in the liver with continued complete response to  treatment. --------------------------------------------------------------------------------------------------------------------------------------------------------- Current Treatment: Herceptin Perjeta maintenance started 12/17/14 Chemotherapy toxicities: 1. 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 I increased the Neurontin to 300 by mouth 3 times a day on 08/05/2015. Patient is discussing about pursuing an alternative therapy options. She feels that neuropathy is getting worse.  Plan: Continue Herceptin Perjeta every 3 weeks Changed to every 4 weeks 05/04/2016 Patient last had a mammogram and colonoscopy with her primary care physician. She gets her mammograms at [facility] Hypertension: Being managed by Dr. Delfina Redwood. Not very well controlled today. We sent a message to their office. Hypertension: Poorly controlled: I instructed the patient to discuss with her primary care physician.  CT chest abdomen pelvis 04/12/2016: No evidence of metastatic disease Additional scanswill beperformed in Sept 2018  Return to clinic every4 weeks for Herceptin and Perjeta and in 8 weeks for clinic follow-up after scans. Labs every 8 weeks forfollow-up   I spent 25 minutes talking to the patient of which more than half was spent in counseling and coordination of care.  No orders of the defined types were placed in this encounter.  The patient has a good understanding of the overall plan. she agrees with it. she will call with any problems that may develop before the next visit here.   Rulon Eisenmenger, MD 06/29/16

## 2016-06-29 NOTE — Patient Instructions (Signed)
[facility] Discharge Instructions for Patients Receiving Chemotherapy  Today you received the following chemotherapy agents Herceptin/Perjeta  To help prevent nausea and vomiting after your treatment, we encourage you to take your nausea medication as needed   If you develop nausea and vomiting that is not controlled by your nausea medication, call the clinic.   BELOW ARE SYMPTOMS THAT SHOULD BE REPORTED IMMEDIATELY:  *FEVER GREATER THAN 100.5 F  *CHILLS WITH OR WITHOUT FEVER  NAUSEA AND VOMITING THAT IS NOT CONTROLLED WITH YOUR NAUSEA MEDICATION  *UNUSUAL SHORTNESS OF BREATH  *UNUSUAL BRUISING OR BLEEDING  TENDERNESS IN MOUTH AND THROAT WITH OR WITHOUT PRESENCE OF ULCERS  *URINARY PROBLEMS  *BOWEL PROBLEMS  UNUSUAL RASH Items with * indicate a potential emergency and should be followed up as soon as possible.  Feel free to call the clinic you have any questions or concerns. The clinic phone number is [phone].  Please show the [place] at check-in to the Emergency Department and triage nurse.

## 2016-07-27 ENCOUNTER — Other Ambulatory Visit: Payer: Self-pay

## 2016-07-27 ENCOUNTER — Ambulatory Visit (HOSPITAL_BASED_OUTPATIENT_CLINIC_OR_DEPARTMENT_OTHER): Payer: PPO

## 2016-07-27 ENCOUNTER — Telehealth: Payer: Self-pay | Admitting: Hematology and Oncology

## 2016-07-27 VITALS — BP 162/97 | HR 52 | Temp 98.2°F | Resp 18

## 2016-07-27 DIAGNOSIS — Z5112 Encounter for antineoplastic immunotherapy: Secondary | ICD-10-CM

## 2016-07-27 DIAGNOSIS — C50411 Malignant neoplasm of upper-outer quadrant of right female breast: Secondary | ICD-10-CM

## 2016-07-27 DIAGNOSIS — Z171 Estrogen receptor negative status [ER-]: Principal | ICD-10-CM

## 2016-07-27 DIAGNOSIS — Z5181 Encounter for therapeutic drug level monitoring: Secondary | ICD-10-CM

## 2016-07-27 MED ORDER — SODIUM CHLORIDE 0.9 % IJ SOLN
10.0000 mL | INTRAMUSCULAR | Status: DC | PRN
  Administered 2016-07-27: 10 mL
  Filled 2016-07-27: qty 10

## 2016-07-27 MED ORDER — ACETAMINOPHEN 325 MG PO TABS
650.0000 mg | ORAL_TABLET | Freq: Once | ORAL | Status: AC
  Administered 2016-07-27: 650 mg via ORAL

## 2016-07-27 MED ORDER — ACETAMINOPHEN 325 MG PO TABS
ORAL_TABLET | ORAL | Status: AC
  Filled 2016-07-27: qty 2

## 2016-07-27 MED ORDER — SODIUM CHLORIDE 0.9 % IV SOLN
Freq: Once | INTRAVENOUS | Status: AC
  Administered 2016-07-27: 09:00:00 via INTRAVENOUS

## 2016-07-27 MED ORDER — TRASTUZUMAB CHEMO 150 MG IV SOLR
6.0000 mg/kg | Freq: Once | INTRAVENOUS | Status: AC
  Administered 2016-07-27: 630 mg via INTRAVENOUS
  Filled 2016-07-27: qty 30

## 2016-07-27 MED ORDER — DIPHENHYDRAMINE HCL 25 MG PO CAPS
ORAL_CAPSULE | ORAL | Status: AC
  Filled 2016-07-27: qty 1

## 2016-07-27 MED ORDER — SODIUM CHLORIDE 0.9 % IV SOLN
420.0000 mg | Freq: Once | INTRAVENOUS | Status: AC
  Administered 2016-07-27: 420 mg via INTRAVENOUS
  Filled 2016-07-27: qty 14

## 2016-07-27 MED ORDER — HEPARIN SOD (PORK) LOCK FLUSH 100 UNIT/ML IV SOLN
500.0000 [IU] | Freq: Once | INTRAVENOUS | Status: AC | PRN
  Administered 2016-07-27: 500 [IU]
  Filled 2016-07-27: qty 5

## 2016-07-27 MED ORDER — DIPHENHYDRAMINE HCL 25 MG PO CAPS
25.0000 mg | ORAL_CAPSULE | Freq: Once | ORAL | Status: AC
Start: 2016-07-27 — End: 2016-07-27
  Administered 2016-07-27: 25 mg via ORAL

## 2016-07-27 NOTE — Telephone Encounter (Signed)
lvm to inform pt of ECHO appt 7/12 at 10 am at [facility]

## 2016-07-27 NOTE — Patient Instructions (Signed)
[facility] Discharge Instructions for Patients Receiving Chemotherapy  Today you received the following chemotherapy agents Perjeta and Herceptin  To help prevent nausea and vomiting after your treatment, we encourage you to take your nausea medication as directed If you develop nausea and vomiting that is not controlled by your nausea medication, call the clinic.   BELOW ARE SYMPTOMS THAT SHOULD BE REPORTED IMMEDIATELY:  *FEVER GREATER THAN 100.5 F  *CHILLS WITH OR WITHOUT FEVER  NAUSEA AND VOMITING THAT IS NOT CONTROLLED WITH YOUR NAUSEA MEDICATION  *UNUSUAL SHORTNESS OF BREATH  *UNUSUAL BRUISING OR BLEEDING  TENDERNESS IN MOUTH AND THROAT WITH OR WITHOUT PRESENCE OF ULCERS  *URINARY PROBLEMS  *BOWEL PROBLEMS  UNUSUAL RASH Items with * indicate a potential emergency and should be followed up as soon as possible.  Feel free to call the clinic you have any questions or concerns. The clinic phone number is [phone].  Please show the [place] at check-in to the Emergency Department and triage nurse.

## 2016-07-28 ENCOUNTER — Ambulatory Visit (HOSPITAL_COMMUNITY)
Admission: RE | Admit: 2016-07-28 | Discharge: 2016-07-28 | Disposition: A | Discharge status: Home / Self Care | Payer: PPO | Source: Ambulatory Visit | Attending: Hematology and Oncology | Admitting: Hematology and Oncology

## 2016-07-28 DIAGNOSIS — Z5181 Encounter for therapeutic drug level monitoring: Secondary | ICD-10-CM | POA: Insufficient documentation

## 2016-07-28 DIAGNOSIS — Z171 Estrogen receptor negative status [ER-]: Secondary | ICD-10-CM | POA: Insufficient documentation

## 2016-07-28 DIAGNOSIS — C50411 Malignant neoplasm of upper-outer quadrant of right female breast: Secondary | ICD-10-CM | POA: Insufficient documentation

## 2016-07-28 DIAGNOSIS — I253 Aneurysm of heart: Secondary | ICD-10-CM | POA: Diagnosis not present

## 2016-07-28 DIAGNOSIS — I340 Nonrheumatic mitral (valve) insufficiency: Secondary | ICD-10-CM | POA: Insufficient documentation

## 2016-07-28 LAB — ECHOCARDIOGRAM COMPLETE
CHL CUP LV S' LATERAL: 9.73 cm/s
CHL CUP MV DEC (S): 370
E decel time: 370 msec
EERAT: 5.48
FS: 48 % — AB (ref 28–44)
IV/PV OW: 1.56
LA diam end sys: 45 mm
LA vol: 90.7 mL
LADIAMINDEX: 2.12 cm/m2
LASIZE: 45 mm
LAVOLA4C: 97.6 mL
LAVOLIN: 42.8 mL/m2
LV PW d: 15.6 mm — AB (ref 0.6–1.1)
LV TDI E'MEDIAL: 4.35
LVEEAVG: 5.48
LVEEMED: 5.48
LVELAT: 8.05 cm/s
LVOT SV: 83 mL
LVOT VTI: 20 cm
LVOT area: 4.15 cm2
LVOT peak vel: 107 cm/s
LVOTD: 23 mm
Lateral S' vel: 6.19 cm/s
MVPKAVEL: 70.3 m/s
MVPKEVEL: 44.1 m/s
TDI e' lateral: 8.05

## 2016-07-28 NOTE — Progress Notes (Signed)
  Echocardiogram 2D Echocardiogram has been performed.  Darlina Sicilian M 07/28/2016, 11:49 AM

## 2016-08-23 NOTE — Assessment & Plan Note (Signed)
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 In addition to Right breast, hypermetabolic disease in Rt [address] LN, axilla and mediastinum, bulky disease in liver, Pulm Mets, Path fracture 9th rib   Treatment summary:Taxol weekly(started 09/10/2014 )Herceptin Perjeta every 3 weeks(started 09/03/2014) Received 10 Weeks Taxol, Herceptin Perjeta given once every 3 weeks (8/17 and 9/7 and 9/28, 10/19) ; changed chemo from Taxol to Abraxane for bronchospasm 11/19/14 and completed 12/03/14   PET CT scan 12/09/2014: Complete response in the liver, marked improvement in metastatic disease in the right breast and right axilla, improvement in bone metastases, decrease in lymphadenopathy throughout. Resolution of lung nodules. CT CAP 03/09/15: No new lung mets, stable ovoid nodule in post lateral right breast, No evidence of met disease in abd, low density lesions in liver are unchanged. CT CAP 06/01/2015:no evidence of metastatic disease in the liver with continued complete response to treatment. --------------------------------------------------------------------------------------------------------------------------------------------------------- Current Treatment: Herceptin Perjeta maintenance started 12/17/14 Chemotherapy toxicities: 1. 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 I increased the Neurontin to 300 by mouth 3 times a day on 08/05/2015. Patient is discussing about pursuing an alternative therapy options. She feels that neuropathy is getting worse.  Plan: Continue Herceptin Perjeta every 3 weeks Changed to every 4 weeks 05/04/2016 Patient last had a mammogram and colonoscopy with her primary care physician. She gets her mammograms at [facility]. Hypertension: Being  managed by Dr. Delfina Redwood. Not very well controlled today. We sent a message to their office. Hypertension: Poorly controlled: I instructed the patient to discuss with her primary care physician.  CT chest abdomen pelvis 04/12/2016: No evidence of metastatic disease Additional scanswill beperformed in Sept 2018  Return to clinic every4weeks for Herceptin and Perjeta and in 8weeks for clinic follow-up after scans. Labs every 8weeks forfollow-up

## 2016-08-24 ENCOUNTER — Other Ambulatory Visit (HOSPITAL_BASED_OUTPATIENT_CLINIC_OR_DEPARTMENT_OTHER): Payer: PPO

## 2016-08-24 ENCOUNTER — Ambulatory Visit (HOSPITAL_BASED_OUTPATIENT_CLINIC_OR_DEPARTMENT_OTHER): Payer: PPO | Admitting: Hematology and Oncology

## 2016-08-24 ENCOUNTER — Encounter: Payer: Self-pay | Admitting: Hematology and Oncology

## 2016-08-24 ENCOUNTER — Ambulatory Visit (HOSPITAL_BASED_OUTPATIENT_CLINIC_OR_DEPARTMENT_OTHER): Payer: PPO

## 2016-08-24 VITALS — BP 133/89 | HR 53 | Temp 97.7°F | Resp 18

## 2016-08-24 DIAGNOSIS — C7951 Secondary malignant neoplasm of bone: Secondary | ICD-10-CM

## 2016-08-24 DIAGNOSIS — C7800 Secondary malignant neoplasm of unspecified lung: Secondary | ICD-10-CM | POA: Diagnosis not present

## 2016-08-24 DIAGNOSIS — C787 Secondary malignant neoplasm of liver and intrahepatic bile duct: Secondary | ICD-10-CM | POA: Diagnosis not present

## 2016-08-24 DIAGNOSIS — G620 Drug-induced polyneuropathy: Secondary | ICD-10-CM

## 2016-08-24 DIAGNOSIS — C50411 Malignant neoplasm of upper-outer quadrant of right female breast: Secondary | ICD-10-CM

## 2016-08-24 DIAGNOSIS — Z171 Estrogen receptor negative status [ER-]: Secondary | ICD-10-CM

## 2016-08-24 DIAGNOSIS — C778 Secondary and unspecified malignant neoplasm of lymph nodes of multiple regions: Secondary | ICD-10-CM

## 2016-08-24 DIAGNOSIS — Z5112 Encounter for antineoplastic immunotherapy: Secondary | ICD-10-CM | POA: Diagnosis not present

## 2016-08-24 DIAGNOSIS — I10 Essential (primary) hypertension: Secondary | ICD-10-CM

## 2016-08-24 LAB — CBC WITH DIFFERENTIAL/PLATELET
BASO%: 0.3 % (ref 0.0–2.0)
BASOS ABS: 0 10*3/uL (ref 0.0–0.1)
EOS ABS: 0.1 10*3/uL (ref 0.0–0.5)
EOS%: 0.9 % (ref 0.0–7.0)
HEMATOCRIT: 46 % (ref 34.8–46.6)
HEMOGLOBIN: 15 g/dL (ref 11.6–15.9)
LYMPH#: 2.2 10*3/uL (ref 0.9–3.3)
LYMPH%: 31.8 % (ref 14.0–49.7)
MCH: 30.9 pg (ref 25.1–34.0)
MCHC: 32.6 g/dL (ref 31.5–36.0)
MCV: 94.7 fL (ref 79.5–101.0)
MONO#: 0.4 10*3/uL (ref 0.1–0.9)
MONO%: 5.5 % (ref 0.0–14.0)
NEUT#: 4.2 10*3/uL (ref 1.5–6.5)
NEUT%: 61.5 % (ref 38.4–76.8)
PLATELETS: 171 10*3/uL (ref 145–400)
RBC: 4.86 10*6/uL (ref 3.70–5.45)
RDW: 14.3 % (ref 11.2–14.5)
WBC: 6.8 10*3/uL (ref 3.9–10.3)

## 2016-08-24 LAB — COMPREHENSIVE METABOLIC PANEL
ALBUMIN: 3.1 g/dL — AB (ref 3.5–5.0)
ALK PHOS: 80 U/L (ref 40–150)
ALT: 16 U/L (ref 0–55)
ANION GAP: 7 meq/L (ref 3–11)
AST: 15 U/L (ref 5–34)
BUN: 13.7 mg/dL (ref 7.0–26.0)
CALCIUM: 9.6 mg/dL (ref 8.4–10.4)
CHLORIDE: 108 meq/L (ref 98–109)
CO2: 25 mEq/L (ref 22–29)
Creatinine: 0.7 mg/dL (ref 0.6–1.1)
EGFR: 86 mL/min/{1.73_m2} — ABNORMAL LOW (ref >90)
Glucose: 136 mg/dl (ref 70–140)
POTASSIUM: 4.1 meq/L (ref 3.5–5.1)
Sodium: 141 mEq/L (ref 136–145)
Total Bilirubin: 0.5 mg/dL (ref 0.20–1.20)
Total Protein: 6.4 g/dL (ref 6.4–8.3)

## 2016-08-24 MED ORDER — ACETAMINOPHEN 325 MG PO TABS
650.0000 mg | ORAL_TABLET | Freq: Once | ORAL | Status: AC
  Administered 2016-08-24: 650 mg via ORAL

## 2016-08-24 MED ORDER — CLONIDINE HCL 0.1 MG PO TABS
0.1000 mg | ORAL_TABLET | Freq: Once | ORAL | Status: AC
  Administered 2016-08-24: 0.1 mg via ORAL

## 2016-08-24 MED ORDER — DIPHENHYDRAMINE HCL 25 MG PO CAPS
ORAL_CAPSULE | ORAL | Status: AC
  Filled 2016-08-24: qty 1

## 2016-08-24 MED ORDER — CLONIDINE HCL 0.1 MG PO TABS
ORAL_TABLET | ORAL | Status: AC
  Filled 2016-08-24: qty 1

## 2016-08-24 MED ORDER — DIPHENHYDRAMINE HCL 25 MG PO CAPS
25.0000 mg | ORAL_CAPSULE | Freq: Once | ORAL | Status: AC
  Administered 2016-08-24: 25 mg via ORAL

## 2016-08-24 MED ORDER — SODIUM CHLORIDE 0.9 % IV SOLN
Freq: Once | INTRAVENOUS | Status: AC
  Administered 2016-08-24: 14:00:00 via INTRAVENOUS

## 2016-08-24 MED ORDER — SODIUM CHLORIDE 0.9 % IV SOLN
6.0000 mg/kg | Freq: Once | INTRAVENOUS | Status: DC
  Filled 2016-08-24: qty 30

## 2016-08-24 MED ORDER — ACETAMINOPHEN 325 MG PO TABS
ORAL_TABLET | ORAL | Status: AC
  Filled 2016-08-24: qty 2

## 2016-08-24 MED ORDER — SODIUM CHLORIDE 0.9 % IJ SOLN
10.0000 mL | INTRAMUSCULAR | Status: DC | PRN
  Filled 2016-08-24: qty 10

## 2016-08-24 MED ORDER — CLONIDINE HCL 0.1 MG PO TABS: 0.1000 mg | ORAL_TABLET | Freq: Two times a day (BID) | ORAL | Status: DC | PRN

## 2016-08-24 MED ORDER — SODIUM CHLORIDE 0.9 % IV SOLN
420.0000 mg | Freq: Once | INTRAVENOUS | Status: DC
  Filled 2016-08-24: qty 14

## 2016-08-24 MED ORDER — HEPARIN SOD (PORK) LOCK FLUSH 100 UNIT/ML IV SOLN
500.0000 [IU] | Freq: Once | INTRAVENOUS | Status: DC | PRN
  Filled 2016-08-24: qty 5

## 2016-08-24 NOTE — Progress Notes (Signed)
Patient Care Team: Seward Carol, MD as PCP - General (Internal Medicine)  DIAGNOSIS:  Encounter Diagnosis  Name Primary?  . 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 In addition to Right breast, hypermetabolic disease in Rt [address] LN, axilla and mediastinum, bulky disease in liver, Pulm Mets, Path fracture 9th rib      09/03/2014 - 11/26/2014 Chemotherapy    Herceptin Perjeta started 09/03/2014, Taxol weekly added 09/10/2014      10/17/2014 Imaging    CT scans: Decrease in right axillary lymph node, decreased skin thickening of the right breast, decreased in bilateral pulmonary nodules, decrease in liver metastases      12/09/2014 PET scan    Remarkable response to chemotherapy resolution of liver metastases, marked improvement in the tumor in the breast, marked improvement in right axillary lymph nodes, resolution of lung nodules, improvement in left sixth rib      12/17/2014 -  Chemotherapy    Herceptin and Perjeta maintenance every 3 weeks       CHIEF COMPLIANT: Follow-up on Herceptin and Perjeta maintenance for metastatic breast cancer  INTERVAL HISTORY: Denise Morton is a 53 year with above-mentioned symptoms metastatic breast cancer is currently on Herceptin and Perjeta maintenance. Previously she had remarkable response in the liver metastases as well as the lymph nodes. She is tolerating the treatment extremely well. She does not have any diarrhea. We will try to obtain the scans before the next visit.  REVIEW OF SYSTEMS:   Constitutional: Denies fevers, chills  or abnormal weight loss Eyes: Denies blurriness of vision Ears, nose, mouth, throat, and face: Denies mucositis or sore throat Respiratory: Denies cough, dyspnea or wheezes Cardiovascular: Denies palpitation, chest discomfort Gastrointestinal:  Denies nausea, heartburn or change in bowel habits Skin: Denies abnormal skin rashes Lymphatics: Denies new lymphadenopathy or easy bruising Neurological:Denies numbness, tingling or new weaknesses Behavioral/Psych: Mood is stable, no new changes  Extremities: No lower extremity edema Breast:  denies any pain or lumps or nodules in either breasts All other systems were reviewed with the patient and are negative.  I have reviewed the past medical history, past surgical history, social history and family history with the patient and they are unchanged from previous note.  ALLERGIES:  is allergic to penicillin g and sulfa antibiotics.  MEDICATIONS:  Current Outpatient Prescriptions  Medication Sig Dispense Refill  . ALPRAZolam (XANAX) 0.25 MG tablet Take 0.25 mg by mouth daily as needed for anxiety.    . carvedilol (COREG) 12.5 MG tablet Take 1 tablet (12.5 mg total) by mouth 2 (two) times daily with a meal. 60 tablet 0  . diphenoxylate-atropine (LOMOTIL) 2.5-0.025 MG tablet Take 1 tablet by mouth 3 (three) times daily as needed for diarrhea or loose stools. 60 tablet 3  . gabapentin (NEURONTIN) 300 MG capsule TAKE ONE CAPSULE BY MOUTH THREE TIMES DAILY 90 capsule 6  . lidocaine-prilocaine (EMLA) cream APPLY TO THE AFFECTED AREA ONCE AS DIRECTED 30 g 0  . loperamide (IMODIUM) 2 MG capsule Take 2 mg by mouth as needed for diarrhea or loose  stools.    Marland Kitchen losartan-hydrochlorothiazide (HYZAAR) 50-12.5 MG tablet Take 1 tablet by mouth daily.    . metFORMIN (GLUMETZA) 500 MG (MOD) 24 hr tablet Take 1 tablet (500 mg total) by mouth daily with breakfast.    . miconazole (MICOTIN) 2 % cream Apply 1 application topically 2 (two) times daily. 28.35 g 0  .  Multiple Vitamin (MULTIVITAMIN WITH MINERALS) TABS tablet Take 1 tablet by mouth daily.    . nicotine polacrilex (COMMIT) 2 MG lozenge Take 2 mg by mouth as needed for smoking cessation.    . ondansetron (ZOFRAN) 8 MG tablet TAKE ONE TABLET BY MOUTH TWICE DAILY. START THE DAY AFTER CHEMO FOR TWO DAYS. THEN TAKE AS NEEDED FOR NAUSEA OR FOR VOMITING  1  . prochlorperazine (COMPAZINE) 10 MG tablet TAKE ONE TABLET BY MOUTH EVERY 6 HOURS AS NEEDED FOR NAUSEA OR FOR VOMITING  1   No current facility-administered medications for this visit.    Facility-Administered Medications Ordered in Other Visits  Medication Dose Route Frequency Provider Last Rate Last Dose  . sodium chloride 0.9 % injection 10 mL  10 mL Intracatheter PRN Nicholas Lose, MD   10 mL at 02/18/15 1111    PHYSICAL EXAMINATION: ECOG PERFORMANCE STATUS: 0 - Asymptomatic  There were no vitals filed for this visit. There were no vitals filed for this visit.  GENERAL:alert, no distress and comfortable SKIN: skin color, texture, turgor are normal, no rashes or significant lesions EYES: normal, Conjunctiva are pink and non-injected, sclera clear OROPHARYNX:no exudate, no erythema and lips, buccal mucosa, and tongue normal  NECK: supple, thyroid normal size, non-tender, without nodularity LYMPH:  no palpable lymphadenopathy in the cervical, axillary or inguinal LUNGS: clear to auscultation and percussion with normal breathing effort HEART: regular rate & rhythm and no murmurs and no lower extremity edema ABDOMEN:abdomen soft, non-tender and normal bowel sounds MUSCULOSKELETAL:no cyanosis of digits and no clubbing  NEURO: alert & oriented x 3 with fluent speech, no focal motor/sensory deficits EXTREMITIES: No lower extremity edema BREAST: No palpable masses or nodules in either right or left breasts. No palpable axillary supraclavicular or infraclavicular adenopathy no breast tenderness or nipple discharge. (exam performed in the  presence of a chaperone)  LABORATORY DATA:  I have reviewed the data as listed   Chemistry      Component Value Date/Time   NA 144 06/29/2016 0750   K 4.2 06/29/2016 0750   CL 108 12/03/2006 1854   CO2 24 06/29/2016 0750   BUN 16.0 06/29/2016 0750   CREATININE 0.8 06/29/2016 0750      Component Value Date/Time   CALCIUM 9.8 06/29/2016 0750   ALKPHOS 84 06/29/2016 0750   AST 16 06/29/2016 0750   ALT 18 06/29/2016 0750   BILITOT 0.67 06/29/2016 0750       Lab Results  Component Value Date   WBC 6.2 06/29/2016   HGB 15.2 06/29/2016   HCT 46.8 (H) 06/29/2016   MCV 94.4 06/29/2016   PLT 199 06/29/2016   NEUTROABS 3.7 06/29/2016    ASSESSMENT & PLAN:  Breast cancer of 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 In addition to Right breast, hypermetabolic disease in Rt [address]raclav LN, axilla and mediastinum, bulky disease in liver, Pulm Mets, Path fracture 9th rib   Treatment summary:Taxol  weekly(started 09/10/2014 )Herceptin Perjeta every 3 weeks(started 09/03/2014) Received 10 Weeks Taxol, Herceptin Perjeta given once every 3 weeks (8/17 and 9/7 and 9/28, 10/19) ; changed chemo from Taxol to Abraxane for bronchospasm 11/19/14 and completed 12/03/14   PET CT scan 12/09/2014: Complete response in the liver, marked improvement in metastatic disease in the right breast and right axilla, improvement in bone metastases, decrease in lymphadenopathy throughout. Resolution of lung nodules. CT CAP 03/09/15: No new lung mets, stable ovoid nodule in post lateral right breast, No evidence of met disease in abd, low density lesions in liver are unchanged. CT CAP 06/01/2015:no evidence of metastatic disease in the liver with continued complete response to treatment. CT  chest abdomen pelvis 04/12/2016: No evidence of metastatic disease --------------------------------------------------------------------------------------------------------------------------------------------------------- Current Treatment: Herceptin Perjeta maintenance started 12/17/14 Chemotherapy toxicities: 1. 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 I increased the Neurontin to 300 by mouth 3 times a day on 08/05/2015. Patient is discussing about pursuing an alternative therapy options. She feels that neuropathy is getting worse.  Plan: Continue Herceptin Perjeta every 3 weeks Changed to every 4 weeks 05/04/2016 Patient last had a mammogram and colonoscopy with her primary care physician. She gets her mammograms at [facility]. Hypertension: Being managed by Dr. Delfina Redwood. Not very well controlled today. We sent a message to their office. Hypertension: Poorly controlled: I instructed the patient to discuss with her primary care physician.  Additional scanswill beperformed in Oct 2018  Return to clinic every4weeks for Herceptin and Perjeta and in 8weeks for clinic follow-up after scans. Labs every 8weeks forfollow-up   I spent 25 minutes talking to the patient of which more than half was spent in counseling and coordination of care.  No orders of the defined types were placed in this encounter.  The patient has a good understanding of the overall plan. she agrees with it. she will call with any problems that may develop before the next visit here.   Rulon Eisenmenger, MD 08/24/16

## 2016-08-24 NOTE — Progress Notes (Signed)
Dr. Lindi Adie notified of patient's elevated blood pressure of 228/103. Per Dr. Lindi Adie patient is to receive 0.1mg  Clonidine now and then recheck patient's BP in 30 min. If systolic is still greater than 180, patient is to receive another 0.1mg  of Clonidine. Patient aware.

## 2016-08-24 NOTE — Patient Instructions (Signed)
[facility] Discharge Instructions for Patients Receiving Chemotherapy  Today you received the following chemotherapy agents Herceptin and Perjeta   To help prevent nausea and vomiting after your treatment, we encourage you to take your nausea medication as directed.    If you develop nausea and vomiting that is not controlled by your nausea medication, call the clinic.   BELOW ARE SYMPTOMS THAT SHOULD BE REPORTED IMMEDIATELY:  *FEVER GREATER THAN 100.5 F  *CHILLS WITH OR WITHOUT FEVER  NAUSEA AND VOMITING THAT IS NOT CONTROLLED WITH YOUR NAUSEA MEDICATION  *UNUSUAL SHORTNESS OF BREATH  *UNUSUAL BRUISING OR BLEEDING  TENDERNESS IN MOUTH AND THROAT WITH OR WITHOUT PRESENCE OF ULCERS  *URINARY PROBLEMS  *BOWEL PROBLEMS  UNUSUAL RASH Items with * indicate a potential emergency and should be followed up as soon as possible.  Feel free to call the clinic you have any questions or concerns. The clinic phone number is [phone].  Please show the CHEMO ALERT CARD at check-in to the Emergency Department and triage nurse.   

## 2016-08-25 NOTE — Progress Notes (Signed)
Patient refused to stay for 30 min observation post Perjeta infusion. Patient and vital signs stable upon discharge.

## 2016-08-25 NOTE — Progress Notes (Signed)
EMR downtime occurred during treatment. Supplemental documentation used to document infusions.

## 2016-09-14 ENCOUNTER — Telehealth: Payer: Self-pay | Admitting: Hematology and Oncology

## 2016-09-14 NOTE — Telephone Encounter (Signed)
Moved patients appt on 9/5 to the afternoon. She got into the [facility] program and is not able to come in until after 12:30 pm.

## 2016-09-21 ENCOUNTER — Ambulatory Visit (HOSPITAL_BASED_OUTPATIENT_CLINIC_OR_DEPARTMENT_OTHER): Payer: PPO

## 2016-09-21 VITALS — BP 157/82 | HR 52 | Temp 98.4°F | Resp 18

## 2016-09-21 DIAGNOSIS — Z5112 Encounter for antineoplastic immunotherapy: Secondary | ICD-10-CM | POA: Diagnosis not present

## 2016-09-21 DIAGNOSIS — C50411 Malignant neoplasm of upper-outer quadrant of right female breast: Secondary | ICD-10-CM | POA: Diagnosis not present

## 2016-09-21 MED ORDER — SODIUM CHLORIDE 0.9 % IV SOLN
Freq: Once | INTRAVENOUS | Status: AC
  Administered 2016-09-21: 14:00:00 via INTRAVENOUS

## 2016-09-21 MED ORDER — DIPHENHYDRAMINE HCL 25 MG PO CAPS
ORAL_CAPSULE | ORAL | Status: AC
  Filled 2016-09-21: qty 1

## 2016-09-21 MED ORDER — SODIUM CHLORIDE 0.9 % IJ SOLN
10.0000 mL | INTRAMUSCULAR | Status: DC | PRN
  Administered 2016-09-21: 10 mL
  Filled 2016-09-21: qty 10

## 2016-09-21 MED ORDER — PERTUZUMAB CHEMO INJECTION 420 MG/14ML
420.0000 mg | Freq: Once | INTRAVENOUS | Status: AC
  Administered 2016-09-21: 420 mg via INTRAVENOUS
  Filled 2016-09-21: qty 14

## 2016-09-21 MED ORDER — HEPARIN SOD (PORK) LOCK FLUSH 100 UNIT/ML IV SOLN
500.0000 [IU] | Freq: Once | INTRAVENOUS | Status: AC | PRN
  Administered 2016-09-21: 500 [IU]
  Filled 2016-09-21: qty 5

## 2016-09-21 MED ORDER — DIPHENHYDRAMINE HCL 25 MG PO CAPS
25.0000 mg | ORAL_CAPSULE | Freq: Once | ORAL | Status: AC
  Administered 2016-09-21: 25 mg via ORAL

## 2016-09-21 MED ORDER — ACETAMINOPHEN 325 MG PO TABS
ORAL_TABLET | ORAL | Status: AC
  Filled 2016-09-21: qty 2

## 2016-09-21 MED ORDER — ACETAMINOPHEN 325 MG PO TABS
650.0000 mg | ORAL_TABLET | Freq: Once | ORAL | Status: AC
  Administered 2016-09-21: 650 mg via ORAL

## 2016-09-21 MED ORDER — TRASTUZUMAB CHEMO 150 MG IV SOLR
6.0000 mg/kg | Freq: Once | INTRAVENOUS | Status: AC
  Administered 2016-09-21: 630 mg via INTRAVENOUS
  Filled 2016-09-21: qty 30

## 2016-09-21 NOTE — Patient Instructions (Signed)
[facility] Discharge Instructions for Patients Receiving Chemotherapy  Today you received the following chemotherapy agents Herceptin and Perjeta   To help prevent nausea and vomiting after your treatment, we encourage you to take your nausea medication as directed.    If you develop nausea and vomiting that is not controlled by your nausea medication, call the clinic.   BELOW ARE SYMPTOMS THAT SHOULD BE REPORTED IMMEDIATELY:  *FEVER GREATER THAN 100.5 F  *CHILLS WITH OR WITHOUT FEVER  NAUSEA AND VOMITING THAT IS NOT CONTROLLED WITH YOUR NAUSEA MEDICATION  *UNUSUAL SHORTNESS OF BREATH  *UNUSUAL BRUISING OR BLEEDING  TENDERNESS IN MOUTH AND THROAT WITH OR WITHOUT PRESENCE OF ULCERS  *URINARY PROBLEMS  *BOWEL PROBLEMS  UNUSUAL RASH Items with * indicate a potential emergency and should be followed up as soon as possible.  Feel free to call the clinic you have any questions or concerns. The clinic phone number is [phone].  Please show the CHEMO ALERT CARD at check-in to the Emergency Department and triage nurse.   

## 2016-09-21 NOTE — Progress Notes (Signed)
Pt declined post perjeta 30-minute observation.

## 2016-09-23 IMAGING — CT CT CHEST W/ CM
2 of 5 series · 16 of 46 positions shown, 18 images · IV contrast (iopamidol)
Comparison: 06/01/2015

CLINICAL DATA: Right breast cancer diagnosed [DATE], chemotherapy
ongoing

EXAM:
CT CHEST, ABDOMEN, AND PELVIS WITH CONTRAST
TECHNIQUE: Multidetector CT imaging of the chest, abdomen and pelvis was
performed following the standard protocol during bolus
administration of intravenous contrast.
CONTRAST:  100mL CAGLBV-EBB IOPAMIDOL (CAGLBV-EBB) INJECTION 61%

[Series 2: cap with st · axial · 0.87mm/px · z∈[-795,-240]mm · 13 of 131 slices shown, 15 images]
[im 10/131  soft-tissue]
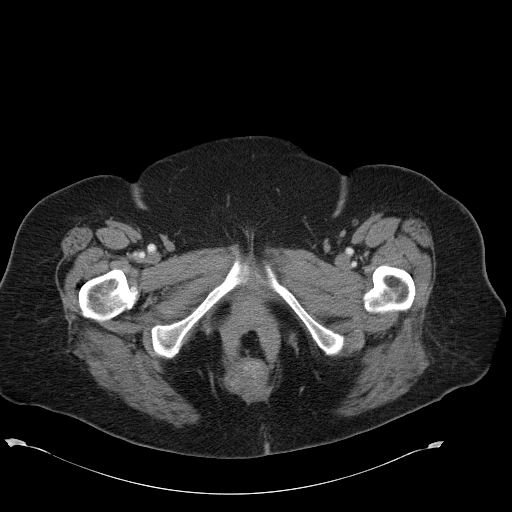
[im 10/131  bone]
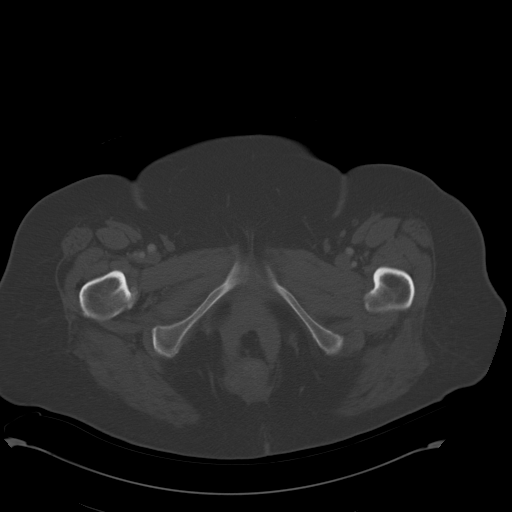
[im 19/131  soft-tissue]
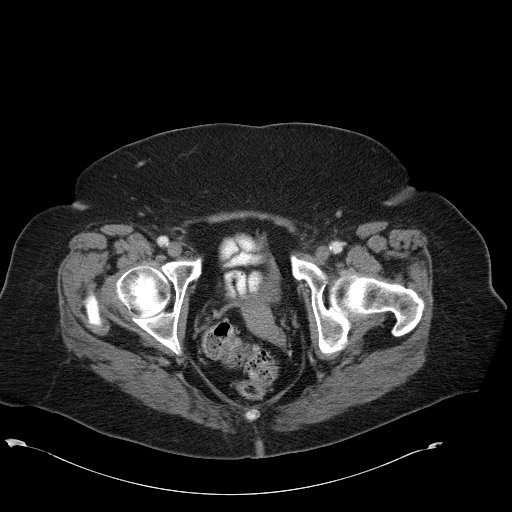
[im 28/131  soft-tissue]
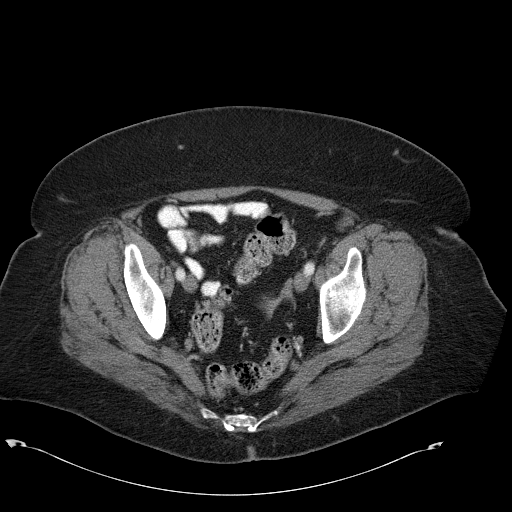
[im 38/131  soft-tissue]
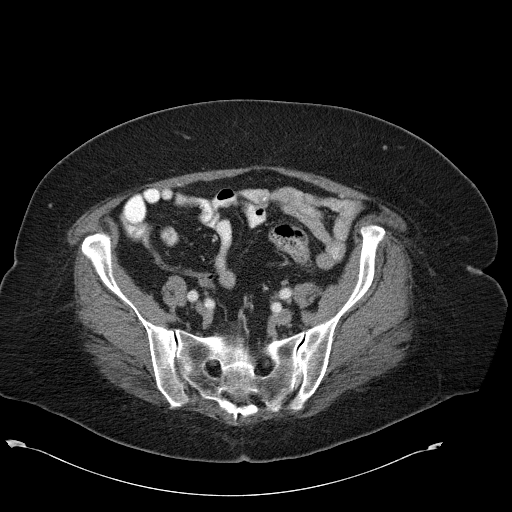
[im 47/131  soft-tissue]
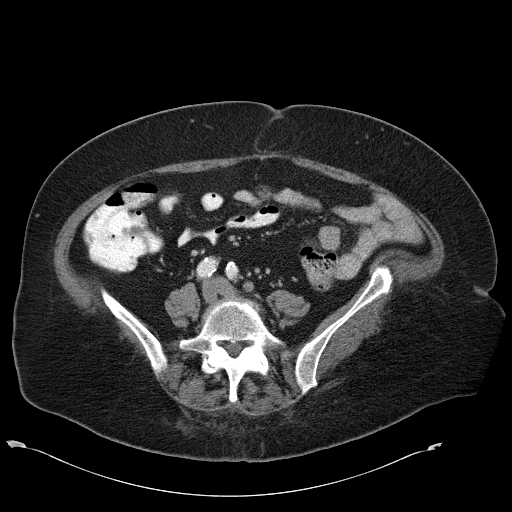
[im 56/131  soft-tissue]
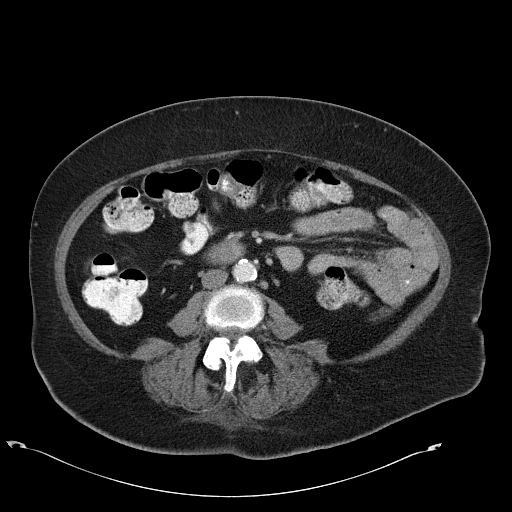
[im 66/131  soft-tissue]
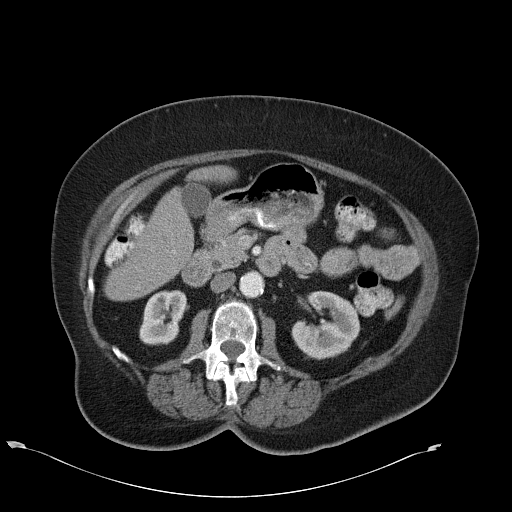
[im 75/131  soft-tissue]
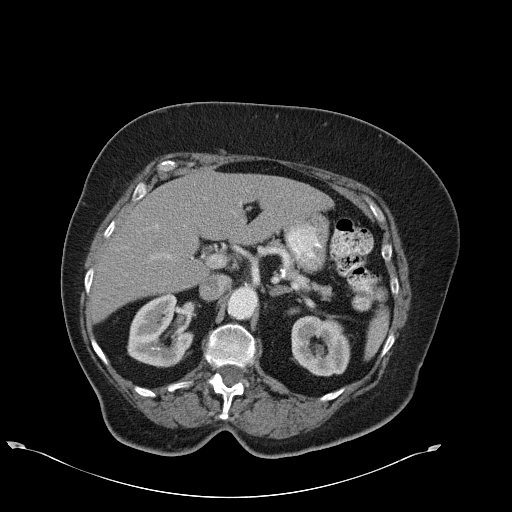
[im 84/131  soft-tissue]
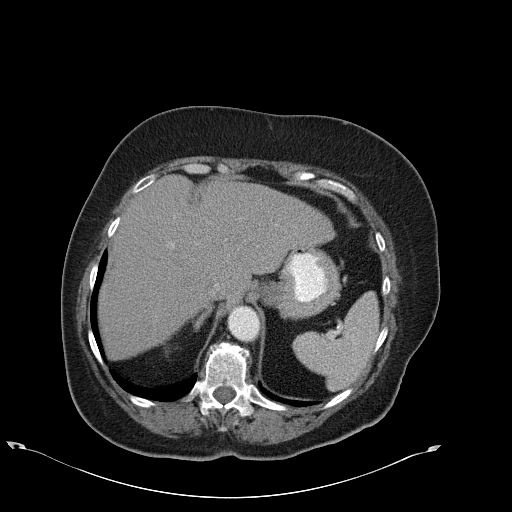
[im 84/131  bone]
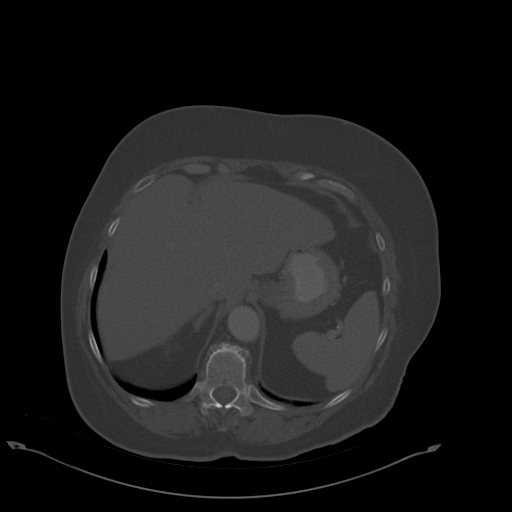
[im 93/131  soft-tissue]
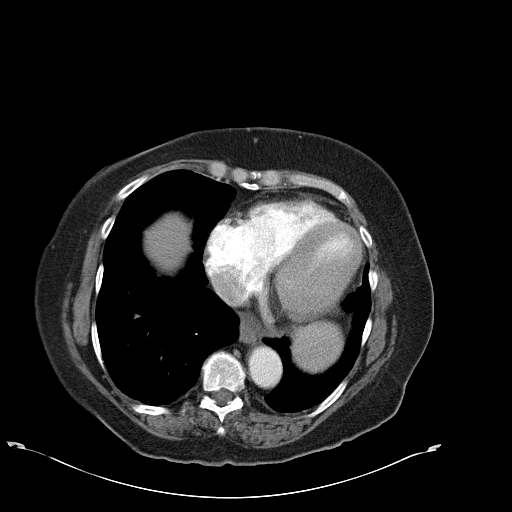
[im 103/131  soft-tissue]
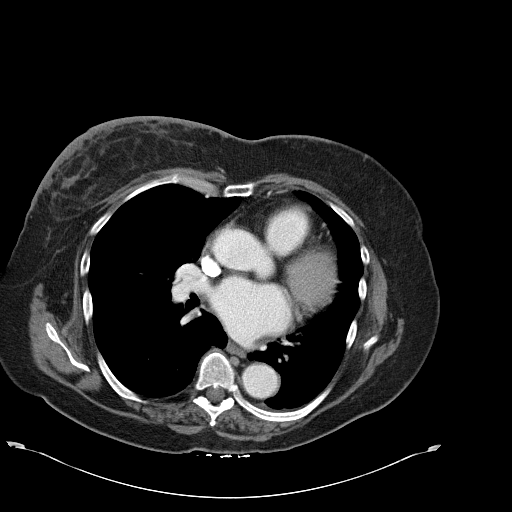
[im 112/131  soft-tissue]
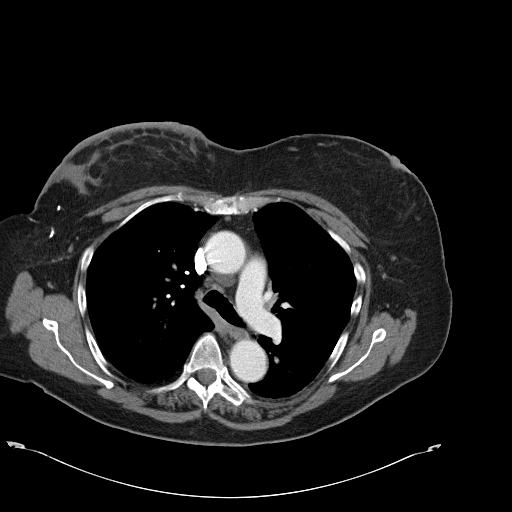
[im 121/131  soft-tissue]
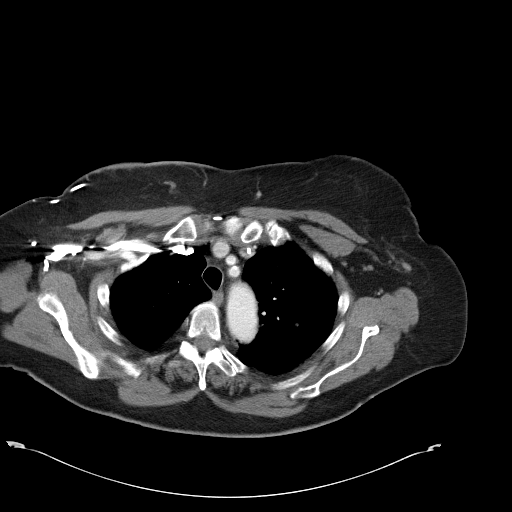

[Series 602: <mpr thick range> · coronal · 1.27mm/px · 3 of 157 slices shown]
[im 53/157  soft-tissue]
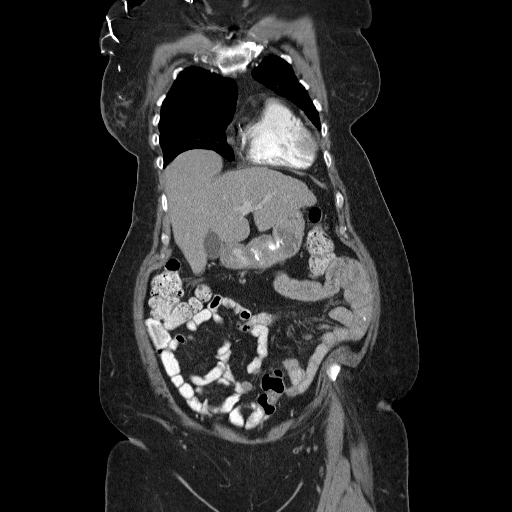
[im 70/157  soft-tissue]
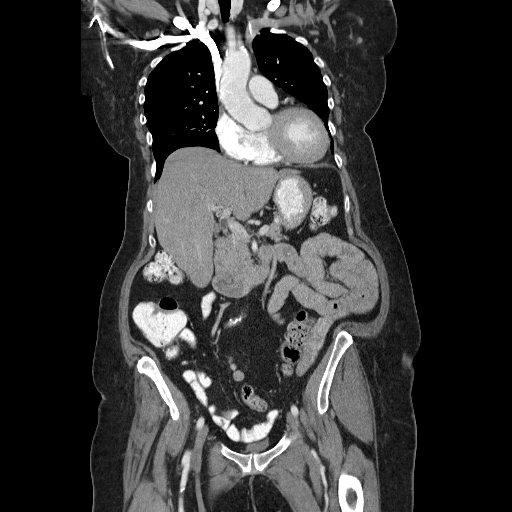
[im 87/157  soft-tissue]
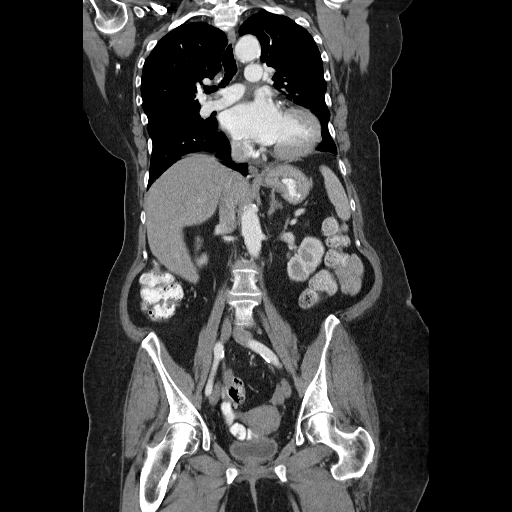

[16 of 46 positions shown; findings below may reference images not displayed]

FINDINGS: CT CHEST FINDINGS

Cardiovascular: Heart is top-normal in size. No pericardial
effusion.

Three vessel coronary atherosclerosis.

Mild atherosclerotic calcifications of the aortic arch.

Left chest port terminates at the cavoatrial junction.

Mediastinum/Nodes: Small mediastinal lymph nodes measuring up to 6
mm short axis (series 2/image 18), within normal limits.

No suspicious hilar or axillary lymphadenopathy.

Visualized thyroid is heterogeneous/ nodular.

Lungs/Pleura: Flat nodular opacity in the posterior left lower lobe
(series 4/ image 95), favored to reflect subsegmental atelectasis
given the configuration.

No focal consolidation.

No suspicious pulmonary nodules.

Mild centrilobular and paraseptal emphysematous changes.

No pleural effusion or pneumothorax.

Musculoskeletal: Radiation changes overlying the right breast
(series 2/ image 26).

Degenerative changes involving the thoracic spine. Mild superior
endplate compression fracture deformity at T11.

CT ABDOMEN PELVIS FINDINGS

Hepatobiliary: The liver is within normal limits.

Gallbladder is unremarkable. No intrahepatic or extrahepatic ductal
dilatation.

Pancreas: Within normal limits.

Spleen: Within normal limits.

Adrenals/Urinary Tract: Mild thickening of the right adrenal gland.
Left adrenal gland is within normal limits.

2.1 cm cyst in the posterior right lower kidney (series 2/image 60).
Subcentimeter cyst in the posterior interpolar left kidney (series
2/image 64).

Multiple tiny nonobstructing renal calculi bilaterally, measuring up
to 4 mm in the right upper kidney (series 2/image 54). No ureteral
or bladder calculi. No hydronephrosis.

Bladder is mildly thick-walled although underdistended.

Stomach/Bowel: Stomach is within normal limits.

No evidence of bowel obstruction.

Normal appendix (series 2/ image 94).

Vascular/Lymphatic: Atherosclerotic calcifications of the abdominal
aorta and branch vessels.

No evidence of abdominal aortic aneurysm.

No suspicious abdominopelvic lymphadenopathy.

Reproductive: 1.6 cm subserosal fibroid along the posterior uterine
body (series 2/ image 107).

Bilateral ovaries are within normal limits.

Other: No abdominopelvic ascites.

Musculoskeletal: Visualized osseous structures are within normal
limits.
IMPRESSION: Radiation changes involving the right breast.

No findings specific for metastatic disease.

Additional stable ancillary findings as above.

## 2016-10-04 ENCOUNTER — Other Ambulatory Visit: Payer: Self-pay

## 2016-10-04 DIAGNOSIS — Z171 Estrogen receptor negative status [ER-]: Principal | ICD-10-CM

## 2016-10-04 DIAGNOSIS — C50411 Malignant neoplasm of upper-outer quadrant of right female breast: Secondary | ICD-10-CM

## 2016-10-18 ENCOUNTER — Encounter (HOSPITAL_COMMUNITY): Payer: Self-pay

## 2016-10-18 ENCOUNTER — Ambulatory Visit (HOSPITAL_COMMUNITY)
Admission: RE | Admit: 2016-10-18 | Discharge: 2016-10-18 | Disposition: A | Discharge status: Home / Self Care | Payer: PPO | Source: Ambulatory Visit | Attending: Hematology and Oncology | Admitting: Hematology and Oncology

## 2016-10-18 DIAGNOSIS — N200 Calculus of kidney: Secondary | ICD-10-CM | POA: Diagnosis not present

## 2016-10-18 DIAGNOSIS — I2510 Atherosclerotic heart disease of native coronary artery without angina pectoris: Secondary | ICD-10-CM | POA: Insufficient documentation

## 2016-10-18 DIAGNOSIS — E049 Nontoxic goiter, unspecified: Secondary | ICD-10-CM | POA: Insufficient documentation

## 2016-10-18 DIAGNOSIS — N281 Cyst of kidney, acquired: Secondary | ICD-10-CM | POA: Diagnosis not present

## 2016-10-18 DIAGNOSIS — I700 Atherosclerosis of aorta: Secondary | ICD-10-CM | POA: Insufficient documentation

## 2016-10-18 DIAGNOSIS — J439 Emphysema, unspecified: Secondary | ICD-10-CM | POA: Insufficient documentation

## 2016-10-18 DIAGNOSIS — K449 Diaphragmatic hernia without obstruction or gangrene: Secondary | ICD-10-CM | POA: Diagnosis not present

## 2016-10-18 DIAGNOSIS — K5730 Diverticulosis of large intestine without perforation or abscess without bleeding: Secondary | ICD-10-CM | POA: Diagnosis not present

## 2016-10-18 DIAGNOSIS — D259 Leiomyoma of uterus, unspecified: Secondary | ICD-10-CM | POA: Diagnosis not present

## 2016-10-18 DIAGNOSIS — C50911 Malignant neoplasm of unspecified site of right female breast: Secondary | ICD-10-CM | POA: Diagnosis not present

## 2016-10-18 DIAGNOSIS — Z170 Estrogen receptor positive status [ER+]: Secondary | ICD-10-CM | POA: Diagnosis not present

## 2016-10-18 DIAGNOSIS — C50411 Malignant neoplasm of upper-outer quadrant of right female breast: Secondary | ICD-10-CM | POA: Insufficient documentation

## 2016-10-18 DIAGNOSIS — I517 Cardiomegaly: Secondary | ICD-10-CM | POA: Insufficient documentation

## 2016-10-18 DIAGNOSIS — K429 Umbilical hernia without obstruction or gangrene: Secondary | ICD-10-CM | POA: Insufficient documentation

## 2016-10-18 DIAGNOSIS — Z171 Estrogen receptor negative status [ER-]: Secondary | ICD-10-CM

## 2016-10-18 HISTORY — DX: Secondary malignant neoplasm of unspecified site: C79.9

## 2016-10-18 LAB — POCT I-STAT CREATININE: CREATININE: 0.7 mg/dL (ref 0.44–1.00)

## 2016-10-18 MED ORDER — IOPAMIDOL (ISOVUE-300) INJECTION 61%
INTRAVENOUS | Status: AC
  Filled 2016-10-18: qty 100

## 2016-10-18 MED ORDER — HEPARIN SOD (PORK) LOCK FLUSH 100 UNIT/ML IV SOLN
500.0000 [IU] | Freq: Once | INTRAVENOUS | Status: AC
  Administered 2016-10-18: 500 [IU] via INTRAVENOUS

## 2016-10-18 MED ORDER — HEPARIN SOD (PORK) LOCK FLUSH 100 UNIT/ML IV SOLN
INTRAVENOUS | Status: AC
  Filled 2016-10-18: qty 5

## 2016-10-18 MED ORDER — IOPAMIDOL (ISOVUE-300) INJECTION 61%
100.0000 mL | Freq: Once | INTRAVENOUS | Status: AC | PRN
  Administered 2016-10-18: 100 mL via INTRAVENOUS

## 2016-10-19 ENCOUNTER — Ambulatory Visit (HOSPITAL_BASED_OUTPATIENT_CLINIC_OR_DEPARTMENT_OTHER): Payer: PPO

## 2016-10-19 ENCOUNTER — Ambulatory Visit (HOSPITAL_BASED_OUTPATIENT_CLINIC_OR_DEPARTMENT_OTHER): Payer: PPO | Admitting: Hematology and Oncology

## 2016-10-19 ENCOUNTER — Other Ambulatory Visit (HOSPITAL_BASED_OUTPATIENT_CLINIC_OR_DEPARTMENT_OTHER): Payer: PPO

## 2016-10-19 ENCOUNTER — Telehealth: Payer: Self-pay | Admitting: Hematology and Oncology

## 2016-10-19 DIAGNOSIS — C787 Secondary malignant neoplasm of liver and intrahepatic bile duct: Secondary | ICD-10-CM

## 2016-10-19 DIAGNOSIS — C171 Malignant neoplasm of jejunum: Secondary | ICD-10-CM

## 2016-10-19 DIAGNOSIS — C778 Secondary and unspecified malignant neoplasm of lymph nodes of multiple regions: Secondary | ICD-10-CM | POA: Diagnosis not present

## 2016-10-19 DIAGNOSIS — Z171 Estrogen receptor negative status [ER-]: Principal | ICD-10-CM

## 2016-10-19 DIAGNOSIS — I10 Essential (primary) hypertension: Secondary | ICD-10-CM | POA: Diagnosis not present

## 2016-10-19 DIAGNOSIS — C7951 Secondary malignant neoplasm of bone: Secondary | ICD-10-CM

## 2016-10-19 DIAGNOSIS — Z5112 Encounter for antineoplastic immunotherapy: Secondary | ICD-10-CM

## 2016-10-19 DIAGNOSIS — C50411 Malignant neoplasm of upper-outer quadrant of right female breast: Secondary | ICD-10-CM | POA: Diagnosis not present

## 2016-10-19 DIAGNOSIS — Z23 Encounter for immunization: Secondary | ICD-10-CM | POA: Diagnosis not present

## 2016-10-19 DIAGNOSIS — C7800 Secondary malignant neoplasm of unspecified lung: Secondary | ICD-10-CM | POA: Diagnosis not present

## 2016-10-19 DIAGNOSIS — G620 Drug-induced polyneuropathy: Secondary | ICD-10-CM

## 2016-10-19 LAB — CBC WITH DIFFERENTIAL/PLATELET
BASO%: 0.6 % (ref 0.0–2.0)
BASOS ABS: 0.1 10*3/uL (ref 0.0–0.1)
EOS ABS: 0.1 10*3/uL (ref 0.0–0.5)
EOS%: 0.8 % (ref 0.0–7.0)
HEMATOCRIT: 49.5 % — AB (ref 34.8–46.6)
HEMOGLOBIN: 16.4 g/dL — AB (ref 11.6–15.9)
LYMPH#: 1.7 10*3/uL (ref 0.9–3.3)
LYMPH%: 20.7 % (ref 14.0–49.7)
MCH: 30.6 pg (ref 25.1–34.0)
MCHC: 33.2 g/dL (ref 31.5–36.0)
MCV: 92.1 fL (ref 79.5–101.0)
MONO#: 0.4 10*3/uL (ref 0.1–0.9)
MONO%: 4.6 % (ref 0.0–14.0)
NEUT#: 6.1 10*3/uL (ref 1.5–6.5)
NEUT%: 73.3 % (ref 38.4–76.8)
Platelets: 194 10*3/uL (ref 145–400)
RBC: 5.37 10*6/uL (ref 3.70–5.45)
RDW: 14.7 % — AB (ref 11.2–14.5)
WBC: 8.3 10*3/uL (ref 3.9–10.3)

## 2016-10-19 LAB — COMPREHENSIVE METABOLIC PANEL
ALBUMIN: 3.4 g/dL — AB (ref 3.5–5.0)
ALT: 16 U/L (ref 0–55)
ANION GAP: 8 meq/L (ref 3–11)
AST: 12 U/L (ref 5–34)
Alkaline Phosphatase: 90 U/L (ref 40–150)
BUN: 18.2 mg/dL (ref 7.0–26.0)
CALCIUM: 10.1 mg/dL (ref 8.4–10.4)
CHLORIDE: 108 meq/L (ref 98–109)
CO2: 26 mEq/L (ref 22–29)
CREATININE: 0.8 mg/dL (ref 0.6–1.1)
EGFR: 74 mL/min/{1.73_m2} — ABNORMAL LOW (ref >90)
Glucose: 171 mg/dl — ABNORMAL HIGH (ref 70–140)
POTASSIUM: 4.3 meq/L (ref 3.5–5.1)
Sodium: 142 mEq/L (ref 136–145)
Total Bilirubin: 0.36 mg/dL (ref 0.20–1.20)
Total Protein: 7 g/dL (ref 6.4–8.3)

## 2016-10-19 MED ORDER — SODIUM CHLORIDE 0.9 % IJ SOLN
10.0000 mL | INTRAMUSCULAR | Status: DC | PRN
  Administered 2016-10-19: 10 mL
  Filled 2016-10-19: qty 10

## 2016-10-19 MED ORDER — TRASTUZUMAB CHEMO 150 MG IV SOLR
6.0000 mg/kg | Freq: Once | INTRAVENOUS | Status: AC
  Administered 2016-10-19: 630 mg via INTRAVENOUS
  Filled 2016-10-19: qty 30

## 2016-10-19 MED ORDER — ACETAMINOPHEN 325 MG PO TABS
650.0000 mg | ORAL_TABLET | Freq: Once | ORAL | Status: AC
  Administered 2016-10-19: 650 mg via ORAL

## 2016-10-19 MED ORDER — HEPARIN SOD (PORK) LOCK FLUSH 100 UNIT/ML IV SOLN
500.0000 [IU] | Freq: Once | INTRAVENOUS | Status: AC | PRN
  Administered 2016-10-19: 500 [IU]
  Filled 2016-10-19: qty 5

## 2016-10-19 MED ORDER — VARENICLINE TARTRATE 0.5 MG X 11 & 1 MG X 42 PO MISC: ORAL | 0 refills | Status: DC

## 2016-10-19 MED ORDER — SODIUM CHLORIDE 0.9 % IV SOLN
Freq: Once | INTRAVENOUS | Status: AC
  Administered 2016-10-19: 11:00:00 via INTRAVENOUS

## 2016-10-19 MED ORDER — DIPHENHYDRAMINE HCL 25 MG PO CAPS
25.0000 mg | ORAL_CAPSULE | Freq: Once | ORAL | Status: AC
  Administered 2016-10-19: 25 mg via ORAL

## 2016-10-19 MED ORDER — ACETAMINOPHEN 325 MG PO TABS
ORAL_TABLET | ORAL | Status: AC
  Filled 2016-10-19: qty 2

## 2016-10-19 MED ORDER — DIPHENHYDRAMINE HCL 25 MG PO CAPS
ORAL_CAPSULE | ORAL | Status: AC
  Filled 2016-10-19: qty 1

## 2016-10-19 MED ORDER — INFLUENZA VAC SPLIT QUAD 0.5 ML IM SUSY
0.5000 mL | PREFILLED_SYRINGE | Freq: Once | INTRAMUSCULAR | Status: AC
  Administered 2016-10-19: 0.5 mL via INTRAMUSCULAR
  Filled 2016-10-19: qty 0.5

## 2016-10-19 NOTE — Patient Instructions (Signed)
[facility] Discharge Instructions for Patients Receiving Chemotherapy  Today you received the following chemotherapy agents: Herceptin   To help prevent nausea and vomiting after your treatment, we encourage you to take your nausea medication as directed.    If you develop nausea and vomiting that is not controlled by your nausea medication, call the clinic.   BELOW ARE SYMPTOMS THAT SHOULD BE REPORTED IMMEDIATELY:  *FEVER GREATER THAN 100.5 F  *CHILLS WITH OR WITHOUT FEVER  NAUSEA AND VOMITING THAT IS NOT CONTROLLED WITH YOUR NAUSEA MEDICATION  *UNUSUAL SHORTNESS OF BREATH  *UNUSUAL BRUISING OR BLEEDING  TENDERNESS IN MOUTH AND THROAT WITH OR WITHOUT PRESENCE OF ULCERS  *URINARY PROBLEMS  *BOWEL PROBLEMS  UNUSUAL RASH Items with * indicate a potential emergency and should be followed up as soon as possible.  Feel free to call the clinic you have any questions or concerns. The clinic phone number is [phone].  Please show the CHEMO ALERT CARD at check-in to the Emergency Department and triage nurse.   

## 2016-10-19 NOTE — Telephone Encounter (Signed)
Scheduled appts per 10/3 los. Patient did not want avs or calendar.

## 2016-10-19 NOTE — Progress Notes (Signed)
Patient Care Team: Seward Carol, MD as PCP - General (Internal Medicine)  DIAGNOSIS:  Encounter Diagnosis  Name Primary?  . 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 In addition to Right breast, hypermetabolic disease in Rt [address] LN, axilla and mediastinum, bulky disease in liver, Pulm Mets, Path fracture 9th rib      09/03/2014 - 11/26/2014 Chemotherapy    Herceptin Perjeta started 09/03/2014, Taxol weekly added 09/10/2014      10/17/2014 Imaging    CT scans: Decrease in right axillary lymph node, decreased skin thickening of the right breast, decreased in bilateral pulmonary nodules, decrease in liver metastases      12/09/2014 PET scan    Remarkable response to chemotherapy resolution of liver metastases, marked improvement in the tumor in the breast, marked improvement in right axillary lymph nodes, resolution of lung nodules, improvement in left sixth rib      12/17/2014 -  Chemotherapy    Herceptin and Perjeta maintenance every 3 weeks, switched to every 4 weeks April 2018, switched to Herceptin alone 10/19/2016 every 4 weeks       CHIEF COMPLIANT: herceptin maintenance  INTERVAL HISTORY: Denise Morton is a 65 year old with above-mentioned history of metastatic breast cancer was been on Herceptin maintenance for the past 2 years.She had a recent CT scans which showed no evidence of metastatic disease. She is here to discuss the treatment plan. She reports no new problems or concerns.  REVIEW OF SYSTEMS:   Constitutional: Denies fevers, chills or abnormal weight  loss Eyes: Denies blurriness of vision Ears, nose, mouth, throat, and face: Denies mucositis or sore throat Respiratory: Denies cough, dyspnea or wheezes Cardiovascular: Denies palpitation, chest discomfort Gastrointestinal:  Denies nausea, heartburn or change in bowel habits Skin: Denies abnormal skin rashes Lymphatics: Denies new lymphadenopathy or easy bruising Neurological:Denies numbness, tingling or new weaknesses Behavioral/Psych: Mood is stable, no new changes  Extremities: No lower extremity edema Breast:  denies any pain or lumps or nodules in either breasts All other systems were reviewed with the patient and are negative.  I have reviewed the past medical history, past surgical history, social history and family history with the patient and they are unchanged from previous note.  ALLERGIES:  is allergic to penicillin g and sulfa antibiotics.  MEDICATIONS:  Current Outpatient Prescriptions  Medication Sig Dispense Refill  . carvedilol (COREG) 12.5 MG tablet Take 1 tablet (12.5 mg total) by mouth 2 (two) times daily with a meal. 60 tablet 0  . gabapentin (NEURONTIN) 300 MG capsule TAKE ONE CAPSULE BY MOUTH THREE TIMES DAILY 90 capsule 6  . lidocaine-prilocaine (EMLA) cream APPLY TO THE AFFECTED AREA ONCE AS DIRECTED 30 g 0  . loperamide (IMODIUM) 2 MG capsule Take 2 mg by mouth as needed for diarrhea or loose stools.    Marland Kitchen losartan-hydrochlorothiazide (HYZAAR) 50-12.5 MG tablet Take 1 tablet by mouth daily.    . metFORMIN (GLUMETZA) 500 MG (MOD) 24 hr tablet Take 1 tablet (500 mg total) by mouth daily with breakfast.    . Multiple Vitamin (MULTIVITAMIN WITH  MINERALS) TABS tablet Take 1 tablet by mouth daily.    . varenicline (CHANTIX STARTING MONTH PAK) 0.5 MG X 11 & 1 MG X 42 tablet Take one 0.5 mg tablet by mouth once daily for 3 days, then increase to one 0.5 mg tablet twice daily for 4 days, then increase to one 1 mg tablet twice daily. 53 tablet 0   No current  facility-administered medications for this visit.    Facility-Administered Medications Ordered in Other Visits  Medication Dose Route Frequency Provider Last Rate Last Dose  . heparin lock flush 100 unit/mL  500 Units Intracatheter Once PRN Nicholas Lose, MD      . sodium chloride 0.9 % injection 10 mL  10 mL Intracatheter PRN Nicholas Lose, MD   10 mL at 02/18/15 1111  . sodium chloride 0.9 % injection 10 mL  10 mL Intracatheter PRN Nicholas Lose, MD      . trastuzumab (HERCEPTIN) 630 mg in sodium chloride 0.9 % 250 mL chemo infusion  6 mg/kg (Treatment Plan Recorded) Intravenous Once Nicholas Lose, MD        PHYSICAL EXAMINATION: ECOG PERFORMANCE STATUS: 0 - Asymptomatic  Vitals:   10/19/16 0930  BP: (!) 192/94  Pulse: (!) 57  Resp: 18  Temp: 98 F (36.7 C)  SpO2: 96%   Filed Weights   10/19/16 0930  Weight: 236 lb 8 oz (107.3 kg)    GENERAL:alert, no distress and comfortable SKIN: skin color, texture, turgor are normal, no rashes or significant lesions EYES: normal, Conjunctiva are pink and non-injected, sclera clear OROPHARYNX:no exudate, no erythema and lips, buccal mucosa, and tongue normal  NECK: supple, thyroid normal size, non-tender, without nodularity LYMPH:  no palpable lymphadenopathy in the cervical, axillary or inguinal LUNGS: clear to auscultation and percussion with normal breathing effort HEART: regular rate & rhythm and no murmurs and no lower extremity edema ABDOMEN:abdomen soft, non-tender and normal bowel sounds MUSCULOSKELETAL:no cyanosis of digits and no clubbing  NEURO: alert & oriented x 3 with fluent speech, no focal motor/sensory deficits EXTREMITIES: No lower extremity edema BREAST: No palpable masses or nodules in either right or left breasts. No palpable axillary supraclavicular or infraclavicular adenopathy no breast tenderness or nipple discharge. (exam performed in the presence of a chaperone)  LABORATORY DATA:  I have reviewed the data as  listed   Chemistry      Component Value Date/Time   NA 142 10/19/2016 0903   K 4.3 10/19/2016 0903   CL 108 12/03/2006 1854   CO2 26 10/19/2016 0903   BUN 18.2 10/19/2016 0903   CREATININE 0.8 10/19/2016 0903      Component Value Date/Time   CALCIUM 10.1 10/19/2016 0903   ALKPHOS 90 10/19/2016 0903   AST 12 10/19/2016 0903   ALT 16 10/19/2016 0903   BILITOT 0.36 10/19/2016 0903       Lab Results  Component Value Date   WBC 8.3 10/19/2016   HGB 16.4 (H) 10/19/2016   HCT 49.5 (H) 10/19/2016   MCV 92.1 10/19/2016   PLT 194 10/19/2016   NEUTROABS 6.1 10/19/2016    ASSESSMENT & PLAN:  Breast cancer of 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 In addition to Right breast, hypermetabolic disease in Rt Supraclav LN, axilla and mediastinum, bulky disease in liver,  Pulm Mets, Path fracture 9th rib   Treatment summary:Taxol weekly(started 09/10/2014 )Herceptin Perjeta every 3 weeks(started 09/03/2014) Received 10 Weeks Taxol, Herceptin Perjeta given once every 3 weeks (8/17 and 9/7 and 9/28, 10/19) ; changed chemo from Taxol to Abraxane for bronchospasm 11/19/14 and completed 12/03/14   PET CT scan 12/09/2014: Complete response in the liver, marked improvement in metastatic disease in the right breast and right axilla, improvement in bone metastases, decrease in lymphadenopathy throughout. Resolution of lung nodules. CT CAP 03/09/15: No new lung mets, stable ovoid nodule in post lateral right breast, No evidence of met disease in abd, low density lesions in liver are unchanged. CT CAP 06/01/2015:no evidence of metastatic disease in the liver with continued complete response to  treatment. --------------------------------------------------------------------------------------------------------------------------------------------------------- Current Treatment: Herceptin Perjeta maintenance started 12/17/14; being switched to Herceptin maintenance along starting 10/19/2016  Chemotherapy toxicities: 1. 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 I increased the Neurontin to 300 by mouth 3 times a day on 08/05/2015.   Plan: change of treatment to Herceptin alone every 4 weeks   Hypertension: Being managed by Dr. Delfina Redwood.   CT chest abdomen pelvis 10/18/2016: No evidence of metastatic disease Based on excellent scan reports, I recommended discontinuing Perjeta and leaving her on Herceptin maintenance every 4 weeks.  I will see the patient back every 3 months with labs and follow-up.   I spent 25 minutes talking to the patient of which more than half was spent in counseling and coordination of care.  No orders of the defined types were placed in this encounter.  The patient has a good understanding of the overall plan. she agrees with it. she will call with any problems that may develop before the next visit here.   Rulon Eisenmenger, MD 10/19/16

## 2016-10-19 NOTE — Assessment & Plan Note (Signed)
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 In addition to Right breast, hypermetabolic disease in Rt Supraclav LN, axilla and mediastinum, bulky disease in liver, Pulm Mets, Path fracture 9th rib   Treatment summary:Taxol weekly(started 09/10/2014 )Herceptin Perjeta every 3 weeks(started 09/03/2014) Received 10 Weeks Taxol, Herceptin Perjeta given once every 3 weeks (8/17 and 9/7 and 9/28, 10/19) ; changed chemo from Taxol to Abraxane for bronchospasm 11/19/14 and completed 12/03/14   PET CT scan 12/09/2014: Complete response in the liver, marked improvement in metastatic disease in the right breast and right axilla, improvement in bone metastases, decrease in lymphadenopathy throughout. Resolution of lung nodules. CT CAP 03/09/15: No new lung mets, stable ovoid nodule in post lateral right breast, No evidence of met disease in abd, low density lesions in liver are unchanged. CT CAP 06/01/2015:no evidence of metastatic disease in the liver with continued complete response to treatment. --------------------------------------------------------------------------------------------------------------------------------------------------------- Current Treatment: Herceptin Perjeta maintenance started 12/17/14; being switched to Herceptin maintenance along starting 10/19/2016  Chemotherapy toxicities: 1. 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 I increased the Neurontin to 300 by mouth 3 times a day on 08/05/2015.   Plan: change of treatment to Herceptin alone every 4 weeks   Hypertension: Being managed by Dr. Delfina Redwood.   CT chest abdomen pelvis 10/18/2016: No evidence of metastatic disease Based on excellent scan reports, I recommended discontinuing Perjeta and leaving  her on Herceptin maintenance every 4 weeks.  I will see the patient back every 3 months with labs and follow-up.

## 2016-11-16 ENCOUNTER — Ambulatory Visit: Payer: PPO

## 2016-11-16 ENCOUNTER — Ambulatory Visit (HOSPITAL_BASED_OUTPATIENT_CLINIC_OR_DEPARTMENT_OTHER): Payer: PPO

## 2016-11-16 VITALS — BP 195/90 | HR 56 | Temp 98.1°F | Resp 18

## 2016-11-16 DIAGNOSIS — C50411 Malignant neoplasm of upper-outer quadrant of right female breast: Secondary | ICD-10-CM | POA: Diagnosis not present

## 2016-11-16 DIAGNOSIS — Z5112 Encounter for antineoplastic immunotherapy: Secondary | ICD-10-CM

## 2016-11-16 MED ORDER — SODIUM CHLORIDE 0.9 % IV SOLN
Freq: Once | INTRAVENOUS | Status: AC
  Administered 2016-11-16: 09:00:00 via INTRAVENOUS

## 2016-11-16 MED ORDER — DIPHENHYDRAMINE HCL 25 MG PO CAPS
ORAL_CAPSULE | ORAL | Status: AC
  Filled 2016-11-16: qty 1

## 2016-11-16 MED ORDER — HEPARIN SOD (PORK) LOCK FLUSH 100 UNIT/ML IV SOLN
500.0000 [IU] | Freq: Once | INTRAVENOUS | Status: AC | PRN
  Administered 2016-11-16: 500 [IU]
  Filled 2016-11-16: qty 5

## 2016-11-16 MED ORDER — SODIUM CHLORIDE 0.9 % IV SOLN
600.0000 mg | Freq: Once | INTRAVENOUS | Status: AC
  Administered 2016-11-16: 600 mg via INTRAVENOUS
  Filled 2016-11-16: qty 28.57

## 2016-11-16 MED ORDER — SODIUM CHLORIDE 0.9 % IJ SOLN
10.0000 mL | INTRAMUSCULAR | Status: DC | PRN
  Administered 2016-11-16: 10 mL
  Filled 2016-11-16: qty 10

## 2016-11-16 MED ORDER — ACETAMINOPHEN 325 MG PO TABS
ORAL_TABLET | ORAL | Status: AC
  Filled 2016-11-16: qty 2

## 2016-11-16 MED ORDER — DIPHENHYDRAMINE HCL 25 MG PO CAPS
25.0000 mg | ORAL_CAPSULE | Freq: Once | ORAL | Status: AC
  Administered 2016-11-16: 25 mg via ORAL

## 2016-11-16 MED ORDER — ACETAMINOPHEN 325 MG PO TABS
650.0000 mg | ORAL_TABLET | Freq: Once | ORAL | Status: AC
  Administered 2016-11-16: 650 mg via ORAL

## 2016-11-16 NOTE — Patient Instructions (Signed)
[facility] Discharge Instructions for Patients Receiving Chemotherapy  Today you received the following chemotherapy agents: Herceptin   To help prevent nausea and vomiting after your treatment, we encourage you to take your nausea medication as directed.    If you develop nausea and vomiting that is not controlled by your nausea medication, call the clinic.   BELOW ARE SYMPTOMS THAT SHOULD BE REPORTED IMMEDIATELY:  *FEVER GREATER THAN 100.5 F  *CHILLS WITH OR WITHOUT FEVER  NAUSEA AND VOMITING THAT IS NOT CONTROLLED WITH YOUR NAUSEA MEDICATION  *UNUSUAL SHORTNESS OF BREATH  *UNUSUAL BRUISING OR BLEEDING  TENDERNESS IN MOUTH AND THROAT WITH OR WITHOUT PRESENCE OF ULCERS  *URINARY PROBLEMS  *BOWEL PROBLEMS  UNUSUAL RASH Items with * indicate a potential emergency and should be followed up as soon as possible.  Feel free to call the clinic you have any questions or concerns. The clinic phone number is [phone].  Please show the CHEMO ALERT CARD at check-in to the Emergency Department and triage nurse.   

## 2016-11-18 ENCOUNTER — Other Ambulatory Visit: Payer: Self-pay

## 2016-11-18 ENCOUNTER — Telehealth: Payer: Self-pay | Admitting: Hematology and Oncology

## 2016-11-18 DIAGNOSIS — Z5181 Encounter for therapeutic drug level monitoring: Secondary | ICD-10-CM

## 2016-11-18 DIAGNOSIS — Z79899 Other long term (current) drug therapy: Principal | ICD-10-CM

## 2016-11-18 NOTE — Telephone Encounter (Signed)
Called and scheduled Echo at [facility] for 11/6 - left voicemail for patient regarding this appt.

## 2016-11-22 ENCOUNTER — Ambulatory Visit (HOSPITAL_COMMUNITY): Payer: PPO

## 2016-11-22 ENCOUNTER — Telehealth: Payer: Self-pay

## 2016-11-22 ENCOUNTER — Other Ambulatory Visit: Payer: Self-pay

## 2016-11-22 NOTE — Telephone Encounter (Signed)
Cardiology called that pt no showed for her echo today. They LVM for pt to call back to r/s.

## 2016-12-02 ENCOUNTER — Ambulatory Visit (HOSPITAL_COMMUNITY)
Admission: RE | Admit: 2016-12-02 | Discharge: 2016-12-02 | Disposition: A | Discharge status: Home / Self Care | Payer: PPO | Source: Ambulatory Visit | Attending: Hematology and Oncology | Admitting: Hematology and Oncology

## 2016-12-02 DIAGNOSIS — I10 Essential (primary) hypertension: Secondary | ICD-10-CM | POA: Insufficient documentation

## 2016-12-02 DIAGNOSIS — Z853 Personal history of malignant neoplasm of breast: Secondary | ICD-10-CM | POA: Diagnosis not present

## 2016-12-02 DIAGNOSIS — Z87891 Personal history of nicotine dependence: Secondary | ICD-10-CM | POA: Insufficient documentation

## 2016-12-02 DIAGNOSIS — Z79899 Other long term (current) drug therapy: Secondary | ICD-10-CM | POA: Insufficient documentation

## 2016-12-02 DIAGNOSIS — Z6839 Body mass index (BMI) 39.0-39.9, adult: Secondary | ICD-10-CM | POA: Insufficient documentation

## 2016-12-02 DIAGNOSIS — E669 Obesity, unspecified: Secondary | ICD-10-CM | POA: Insufficient documentation

## 2016-12-02 DIAGNOSIS — E119 Type 2 diabetes mellitus without complications: Secondary | ICD-10-CM | POA: Diagnosis not present

## 2016-12-02 DIAGNOSIS — Z5181 Encounter for therapeutic drug level monitoring: Secondary | ICD-10-CM

## 2016-12-02 NOTE — Progress Notes (Signed)
  Echocardiogram 2D Echocardiogram has been performed.  Tresa Res 12/02/2016, 9:40 AM

## 2016-12-06 ENCOUNTER — Telehealth: Payer: Self-pay

## 2016-12-06 NOTE — Telephone Encounter (Signed)
Pt called for a schedule update and informed her of her next appointments. She had a concern with being locked out of her MyChart account. She was given a number to call for that.  No further issues at this time.  Cyndia Bent RN

## 2016-12-14 ENCOUNTER — Ambulatory Visit (HOSPITAL_BASED_OUTPATIENT_CLINIC_OR_DEPARTMENT_OTHER): Payer: PPO

## 2016-12-14 VITALS — BP 162/100 | HR 54 | Temp 97.7°F | Resp 18

## 2016-12-14 DIAGNOSIS — C50411 Malignant neoplasm of upper-outer quadrant of right female breast: Secondary | ICD-10-CM

## 2016-12-14 DIAGNOSIS — Z5112 Encounter for antineoplastic immunotherapy: Secondary | ICD-10-CM | POA: Diagnosis not present

## 2016-12-14 MED ORDER — ACETAMINOPHEN 325 MG PO TABS
650.0000 mg | ORAL_TABLET | Freq: Once | ORAL | Status: AC
  Administered 2016-12-14: 650 mg via ORAL

## 2016-12-14 MED ORDER — SODIUM CHLORIDE 0.9 % IV SOLN
Freq: Once | INTRAVENOUS | Status: AC
  Administered 2016-12-14: 08:00:00 via INTRAVENOUS

## 2016-12-14 MED ORDER — HEPARIN SOD (PORK) LOCK FLUSH 100 UNIT/ML IV SOLN
500.0000 [IU] | Freq: Once | INTRAVENOUS | Status: AC | PRN
  Administered 2016-12-14: 500 [IU]
  Filled 2016-12-14: qty 5

## 2016-12-14 MED ORDER — DIPHENHYDRAMINE HCL 25 MG PO CAPS
ORAL_CAPSULE | ORAL | Status: AC
  Filled 2016-12-14: qty 1

## 2016-12-14 MED ORDER — SODIUM CHLORIDE 0.9 % IJ SOLN
10.0000 mL | INTRAMUSCULAR | Status: DC | PRN
  Administered 2016-12-14: 10 mL
  Filled 2016-12-14: qty 10

## 2016-12-14 MED ORDER — SODIUM CHLORIDE 0.9 % IV SOLN
6.0000 mg/kg | Freq: Once | INTRAVENOUS | Status: AC
  Administered 2016-12-14: 630 mg via INTRAVENOUS
  Filled 2016-12-14: qty 30

## 2016-12-14 MED ORDER — DIPHENHYDRAMINE HCL 25 MG PO CAPS
25.0000 mg | ORAL_CAPSULE | Freq: Once | ORAL | Status: AC
Start: 2016-12-14 — End: 2016-12-14
  Administered 2016-12-14: 25 mg via ORAL

## 2016-12-14 MED ORDER — ACETAMINOPHEN 325 MG PO TABS
ORAL_TABLET | ORAL | Status: AC
  Filled 2016-12-14: qty 2

## 2016-12-14 NOTE — Patient Instructions (Signed)
[facility] Discharge Instructions for Patients Receiving Chemotherapy  Today you received the following chemotherapy agents Herceptin  To help prevent nausea and vomiting after your treatment, we encourage you to take your nausea medication as directed   If you develop nausea and vomiting that is not controlled by your nausea medication, call the clinic.   BELOW ARE SYMPTOMS THAT SHOULD BE REPORTED IMMEDIATELY:  *FEVER GREATER THAN 100.5 F  *CHILLS WITH OR WITHOUT FEVER  NAUSEA AND VOMITING THAT IS NOT CONTROLLED WITH YOUR NAUSEA MEDICATION  *UNUSUAL SHORTNESS OF BREATH  *UNUSUAL BRUISING OR BLEEDING  TENDERNESS IN MOUTH AND THROAT WITH OR WITHOUT PRESENCE OF ULCERS  *URINARY PROBLEMS  *BOWEL PROBLEMS  UNUSUAL RASH Items with * indicate a potential emergency and should be followed up as soon as possible.  Feel free to call the clinic should you have any questions or concerns. The clinic phone number is [phone].  Please show the CHEMO ALERT CARD at check-in to the Emergency Department and triage nurse.   

## 2016-12-14 NOTE — Progress Notes (Signed)
Per Dr. Lindi Adie okay to treat with blood pressure of 162/100.

## 2017-01-10 NOTE — Assessment & Plan Note (Signed)
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 In addition to Right breast, hypermetabolic disease in Rt Supraclav LN, axilla and mediastinum, bulky disease in liver, Pulm Mets, Path fracture 9th rib   Treatment summary:Taxol weekly(started 09/10/2014 )Herceptin Perjeta every 3 weeks(started 09/03/2014) Received 10 Weeks Taxol, Herceptin Perjeta given once every 3 weeks (8/17 and 9/7 and 9/28, 10/19) ; changed chemo from Taxol to Abraxane for bronchospasm 11/19/14 and completed 12/03/14   PET CT scan 12/09/2014: Complete response in the liver, marked improvement in metastatic disease in the right breast and right axilla, improvement in bone metastases, decrease in lymphadenopathy throughout. Resolution of lung nodules. CT CAP 03/09/15: No new lung mets, stable ovoid nodule in post lateral right breast, No evidence of met disease in abd, low density lesions in liver are unchanged. CT CAP 06/01/2015:no evidence of metastatic disease in the liver with continued complete response to treatment. --------------------------------------------------------------------------------------------------------------------------------------------------------- Current Treatment: Herceptin Perjeta maintenance started 12/17/14; switched to Herceptin maintenance starting 10/19/2016  Chemotherapy toxicities: 1. 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 I increased the Neurontin to 300 by mouth 3 times a day on 08/05/2015.   Plan: Herceptin alone every 4 weeks   Hypertension: Being managed by Dr. Delfina Redwood.   CT chest abdomen pelvis 10/18/2016: No evidence of metastatic disease  I will see the patient back every 3 months with scans, labs and follow-up.

## 2017-01-11 ENCOUNTER — Other Ambulatory Visit: Payer: Self-pay | Admitting: Hematology and Oncology

## 2017-01-11 ENCOUNTER — Ambulatory Visit (HOSPITAL_BASED_OUTPATIENT_CLINIC_OR_DEPARTMENT_OTHER): Payer: PPO | Admitting: Hematology and Oncology

## 2017-01-11 ENCOUNTER — Ambulatory Visit (HOSPITAL_BASED_OUTPATIENT_CLINIC_OR_DEPARTMENT_OTHER): Payer: PPO

## 2017-01-11 ENCOUNTER — Telehealth: Payer: Self-pay | Admitting: Hematology and Oncology

## 2017-01-11 ENCOUNTER — Ambulatory Visit: Payer: PPO

## 2017-01-11 ENCOUNTER — Other Ambulatory Visit (HOSPITAL_BASED_OUTPATIENT_CLINIC_OR_DEPARTMENT_OTHER): Payer: PPO

## 2017-01-11 DIAGNOSIS — C778 Secondary and unspecified malignant neoplasm of lymph nodes of multiple regions: Secondary | ICD-10-CM

## 2017-01-11 DIAGNOSIS — C7800 Secondary malignant neoplasm of unspecified lung: Secondary | ICD-10-CM

## 2017-01-11 DIAGNOSIS — Z5112 Encounter for antineoplastic immunotherapy: Secondary | ICD-10-CM | POA: Diagnosis not present

## 2017-01-11 DIAGNOSIS — I10 Essential (primary) hypertension: Secondary | ICD-10-CM

## 2017-01-11 DIAGNOSIS — C7951 Secondary malignant neoplasm of bone: Secondary | ICD-10-CM

## 2017-01-11 DIAGNOSIS — Z171 Estrogen receptor negative status [ER-]: Secondary | ICD-10-CM | POA: Diagnosis not present

## 2017-01-11 DIAGNOSIS — C50411 Malignant neoplasm of upper-outer quadrant of right female breast: Secondary | ICD-10-CM

## 2017-01-11 DIAGNOSIS — C787 Secondary malignant neoplasm of liver and intrahepatic bile duct: Secondary | ICD-10-CM

## 2017-01-11 DIAGNOSIS — G620 Drug-induced polyneuropathy: Secondary | ICD-10-CM

## 2017-01-11 LAB — COMPREHENSIVE METABOLIC PANEL
ALBUMIN: 3.2 g/dL — AB (ref 3.5–5.0)
ALK PHOS: 80 U/L (ref 40–150)
ALT: 19 U/L (ref 0–55)
ANION GAP: 10 meq/L (ref 3–11)
AST: 14 U/L (ref 5–34)
BUN: 27.6 mg/dL — ABNORMAL HIGH (ref 7.0–26.0)
CALCIUM: 9.4 mg/dL (ref 8.4–10.4)
CHLORIDE: 108 meq/L (ref 98–109)
CO2: 24 mEq/L (ref 22–29)
Creatinine: 1.1 mg/dL (ref 0.6–1.1)
EGFR: 55 mL/min/{1.73_m2} — AB (ref >60)
Glucose: 131 mg/dl (ref 70–140)
Potassium: 3.7 mEq/L (ref 3.5–5.1)
Sodium: 142 mEq/L (ref 136–145)
Total Bilirubin: 0.25 mg/dL (ref 0.20–1.20)
Total Protein: 6.4 g/dL (ref 6.4–8.3)

## 2017-01-11 LAB — CBC WITH DIFFERENTIAL/PLATELET
BASO%: 0.4 % (ref 0.0–2.0)
Basophils Absolute: 0 10*3/uL (ref 0.0–0.1)
EOS%: 0.9 % (ref 0.0–7.0)
Eosinophils Absolute: 0.1 10*3/uL (ref 0.0–0.5)
HEMATOCRIT: 46.4 % (ref 34.8–46.6)
HEMOGLOBIN: 14.8 g/dL (ref 11.6–15.9)
LYMPH#: 2.9 10*3/uL (ref 0.9–3.3)
LYMPH%: 38 % (ref 14.0–49.7)
MCH: 30.2 pg (ref 25.1–34.0)
MCHC: 31.9 g/dL (ref 31.5–36.0)
MCV: 94.7 fL (ref 79.5–101.0)
MONO#: 0.4 10*3/uL (ref 0.1–0.9)
MONO%: 5.6 % (ref 0.0–14.0)
NEUT%: 55.1 % (ref 38.4–76.8)
NEUTROS ABS: 4.2 10*3/uL (ref 1.5–6.5)
PLATELETS: 183 10*3/uL (ref 145–400)
RBC: 4.9 10*6/uL (ref 3.70–5.45)
RDW: 14.7 % — AB (ref 11.2–14.5)
WBC: 7.6 10*3/uL (ref 3.9–10.3)

## 2017-01-11 MED ORDER — DIPHENHYDRAMINE HCL 25 MG PO CAPS
ORAL_CAPSULE | ORAL | Status: AC
  Filled 2017-01-11: qty 1

## 2017-01-11 MED ORDER — SODIUM CHLORIDE 0.9 % IV SOLN
Freq: Once | INTRAVENOUS | Status: AC
  Administered 2017-01-11: 10:00:00 via INTRAVENOUS

## 2017-01-11 MED ORDER — ACETAMINOPHEN 325 MG PO TABS
ORAL_TABLET | ORAL | Status: AC
  Filled 2017-01-11: qty 2

## 2017-01-11 MED ORDER — HEPARIN SOD (PORK) LOCK FLUSH 100 UNIT/ML IV SOLN
500.0000 [IU] | Freq: Once | INTRAVENOUS | Status: AC | PRN
  Administered 2017-01-11: 500 [IU]
  Filled 2017-01-11: qty 5

## 2017-01-11 MED ORDER — ACETAMINOPHEN 325 MG PO TABS
650.0000 mg | ORAL_TABLET | Freq: Once | ORAL | Status: AC
  Administered 2017-01-11: 650 mg via ORAL

## 2017-01-11 MED ORDER — PROCHLORPERAZINE MALEATE 10 MG PO TABS: 10.0000 mg | ORAL_TABLET | Freq: Four times a day (QID) | ORAL | 3 refills | Status: DC | PRN

## 2017-01-11 MED ORDER — DIPHENHYDRAMINE HCL 25 MG PO CAPS
25.0000 mg | ORAL_CAPSULE | Freq: Once | ORAL | Status: AC
  Administered 2017-01-11: 25 mg via ORAL

## 2017-01-11 MED ORDER — SODIUM CHLORIDE 0.9 % IJ SOLN
10.0000 mL | INTRAMUSCULAR | Status: DC | PRN
  Administered 2017-01-11: 10 mL
  Filled 2017-01-11: qty 10

## 2017-01-11 MED ORDER — SODIUM CHLORIDE 0.9 % IV SOLN
6.0000 mg/kg | Freq: Once | INTRAVENOUS | Status: AC
  Administered 2017-01-11: 630 mg via INTRAVENOUS
  Filled 2017-01-11: qty 30

## 2017-01-11 MED ORDER — ONDANSETRON HCL 8 MG PO TABS: 8.0000 mg | ORAL_TABLET | Freq: Three times a day (TID) | ORAL | 3 refills | Status: DC | PRN

## 2017-01-11 NOTE — Patient Instructions (Signed)
[facility] Discharge Instructions for Patients Receiving Chemotherapy  Today you received the following chemotherapy agents Herceptin  To help prevent nausea and vomiting after your treatment, we encourage you to take your nausea medication as directed   If you develop nausea and vomiting that is not controlled by your nausea medication, call the clinic.   BELOW ARE SYMPTOMS THAT SHOULD BE REPORTED IMMEDIATELY:  *FEVER GREATER THAN 100.5 F  *CHILLS WITH OR WITHOUT FEVER  NAUSEA AND VOMITING THAT IS NOT CONTROLLED WITH YOUR NAUSEA MEDICATION  *UNUSUAL SHORTNESS OF BREATH  *UNUSUAL BRUISING OR BLEEDING  TENDERNESS IN MOUTH AND THROAT WITH OR WITHOUT PRESENCE OF ULCERS  *URINARY PROBLEMS  *BOWEL PROBLEMS  UNUSUAL RASH Items with * indicate a potential emergency and should be followed up as soon as possible.  Feel free to call the clinic should you have any questions or concerns. The clinic phone number is [phone].  Please show the CHEMO ALERT CARD at check-in to the Emergency Department and triage nurse.   

## 2017-01-11 NOTE — Progress Notes (Signed)
Patient Care Team: Seward Carol, MD as PCP - General (Internal Medicine)  DIAGNOSIS:  Encounter Diagnosis  Name Primary?  . Malignant neoplasm of upper-outer quadrant of right breast in female, estrogen receptor negative ([place])     SUMMARY OF ONCOLOGIC HISTORY:   Breast cancer of upper-outer quadrant of right female breast ([place])   08/15/2014 Initial Diagnosis    Right Breast Inflammatory invasive ductal cancer: 17 cm by ultrasound along with 13 cm ulceration, node also positive T4N1 (stage 3C) ER 0%, PR 0%, Her-2 Pos      08/22/2014 PET scan    Metastatic disease. In addition to Right breast, hypermetabolic disease in Rt Supraclav LN, axilla and mediastinum, bulky disease in liver, Pulm Mets, Path fracture 9th rib      09/03/2014 - 11/26/2014 Chemotherapy    Herceptin Perjeta started 09/03/2014, Taxol weekly added 09/10/2014      10/17/2014 Imaging    CT scans: Decrease in right axillary lymph node, decreased skin thickening of the right breast, decreased in bilateral pulmonary nodules, decrease in liver metastases      12/09/2014 PET scan    Remarkable response to chemotherapy resolution of liver metastases, marked improvement in the tumor in the breast, marked improvement in right axillary lymph nodes, resolution of lung nodules, improvement in left sixth rib      12/17/2014 -  Chemotherapy    Herceptin and Perjeta maintenance every 3 weeks, switched to every 4 weeks April 2018, switched to Herceptin alone 10/19/2016 every 4 weeks       CHIEF COMPLIANT: Herceptin maintenance  INTERVAL HISTORY: Micayla Brathwaite is a 66 year old with above-mentioned history of metastatic breast cancer who is currently on Herceptin maintenance.  She is tolerating the treatment extremely well.  She denies any nausea, vomiting, fevers or chills.  Denies any pain or discomfort in the body related to the metastatic disease. Nausea and vomiting and weight loss  REVIEW OF SYSTEMS:   Constitutional:  Denies fevers, chills. Weight loss. Eyes: Denies blurriness of vision Ears, nose, mouth, throat, and face: Denies mucositis or sore throat Respiratory: Denies cough, dyspnea or wheezes Cardiovascular: Denies palpitation, chest discomfort Gastrointestinal: Nausea and vomiting. Skin: Denies abnormal skin rashes Lymphatics: Denies new lymphadenopathy or easy bruising Neurological:Denies numbness, tingling or new weaknesses Behavioral/Psych: Mood is stable, no new changes  Extremities: No lower extremity edema  All other systems were reviewed with the patient and are negative.  I have reviewed the past medical history, past surgical history, social history and family history with the patient and they are unchanged from previous note.  ALLERGIES:  is allergic to penicillin g and sulfa antibiotics.  MEDICATIONS:  Current Outpatient Medications  Medication Sig Dispense Refill  . carvedilol (COREG) 12.5 MG tablet Take 1 tablet (12.5 mg total) by mouth 2 (two) times daily with a meal. 60 tablet 0  . gabapentin (NEURONTIN) 300 MG capsule TAKE ONE CAPSULE BY MOUTH THREE TIMES DAILY 90 capsule 6  . lidocaine-prilocaine (EMLA) cream APPLY TO THE AFFECTED AREA ONCE AS DIRECTED 30 g 0  . loperamide (IMODIUM) 2 MG capsule Take 2 mg by mouth as needed for diarrhea or loose stools.    Marland Kitchen losartan-hydrochlorothiazide (HYZAAR) 50-12.5 MG tablet Take 1 tablet by mouth daily.    . metFORMIN (GLUMETZA) 500 MG (MOD) 24 hr tablet Take 1 tablet (500 mg total) by mouth daily with breakfast.    . Multiple Vitamin (MULTIVITAMIN WITH MINERALS) TABS tablet Take 1 tablet by mouth daily.    Marland Kitchen  varenicline (CHANTIX STARTING MONTH PAK) 0.5 MG X 11 & 1 MG X 42 tablet Take one 0.5 mg tablet by mouth once daily for 3 days, then increase to one 0.5 mg tablet twice daily for 4 days, then increase to one 1 mg tablet twice daily. 53 tablet 0   No current facility-administered medications for this visit.    Facility-Administered  Medications Ordered in Other Visits  Medication Dose Route Frequency Provider Last Rate Last Dose  . sodium chloride 0.9 % injection 10 mL  10 mL Intracatheter PRN Nicholas Lose, MD   10 mL at 02/18/15 1111    PHYSICAL EXAMINATION: ECOG PERFORMANCE STATUS: 1 - Symptomatic but completely ambulatory  Vitals:   01/11/17 0754  BP: (!) 145/106  Pulse: 62  Resp: 18  Temp: 97.7 F (36.5 C)  SpO2: 97%   Filed Weights   01/11/17 0754  Weight: 227 lb 1.6 oz (103 kg)    GENERAL:alert, no distress and comfortable SKIN: skin color, texture, turgor are normal, no rashes or significant lesions EYES: normal, Conjunctiva are pink and non-injected, sclera clear OROPHARYNX:no exudate, no erythema and lips, buccal mucosa, and tongue normal  NECK: supple, thyroid normal size, non-tender, without nodularity LYMPH:  no palpable lymphadenopathy in the cervical, axillary or inguinal LUNGS: clear to auscultation and percussion with normal breathing effort HEART: regular rate & rhythm and no murmurs and no lower extremity edema ABDOMEN:abdomen soft, non-tender and normal bowel sounds MUSCULOSKELETAL:no cyanosis of digits and no clubbing  NEURO: alert & oriented x 3 with fluent speech, no focal motor/sensory deficits EXTREMITIES: No lower extremity edema  LABORATORY DATA:  I have reviewed the data as listed   Chemistry      Component Value Date/Time   NA 142 10/19/2016 0903   K 4.3 10/19/2016 0903   CL 108 12/03/2006 1854   CO2 26 10/19/2016 0903   BUN 18.2 10/19/2016 0903   CREATININE 0.8 10/19/2016 0903      Component Value Date/Time   CALCIUM 10.1 10/19/2016 0903   ALKPHOS 90 10/19/2016 0903   AST 12 10/19/2016 0903   ALT 16 10/19/2016 0903   BILITOT 0.36 10/19/2016 0903       Lab Results  Component Value Date   WBC 8.3 10/19/2016   HGB 16.4 (H) 10/19/2016   HCT 49.5 (H) 10/19/2016   MCV 92.1 10/19/2016   PLT 194 10/19/2016   NEUTROABS 6.1 10/19/2016    ASSESSMENT & PLAN:    Breast cancer of 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 In addition to Right breast, hypermetabolic disease in Rt Supraclav LN, axilla and mediastinum, bulky disease in liver, Pulm Mets, Path fracture 9th rib   Treatment summary:Taxol weekly(started 09/10/2014 )Herceptin Perjeta every 3 weeks(started 09/03/2014) Received 10 Weeks Taxol, Herceptin Perjeta given once every 3 weeks (8/17 and 9/7 and 9/28, 10/19) ; changed chemo from Taxol to Abraxane for bronchospasm 11/19/14 and completed 12/03/14   PET CT scan 12/09/2014: Complete response in the liver, marked improvement in metastatic disease in the right breast and right axilla, improvement in bone metastases, decrease in lymphadenopathy throughout. Resolution of lung nodules. CT CAP 03/09/15: No new lung mets, stable ovoid nodule in post lateral right breast, No evidence of met disease in abd, low density lesions in liver are unchanged. CT CAP  06/01/2015:no evidence of metastatic disease in the liver with continued complete response to treatment. --------------------------------------------------------------------------------------------------------------------------------------------------------- Current Treatment: Herceptin Perjeta maintenance started 12/17/14; switched to Herceptin maintenance starting 10/19/2016  Chemotherapy toxicities: 1. Very occasional diarrhea 2. Nausea and Vomiting 3. Weight loss  It is unclear why the patient is having nausea and vomiting accompanied by weight loss because Herceptin has never caused the symptoms before I refill her Zofran and Compazine for nausea. She will discussed with Dr. Delfina Redwood if any of her other medications may be causing her nausea.  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 I increased the Neurontin to 300 by mouth 3 times a day on 08/05/2015.   Plan: Herceptin alone every 4 weeks   Hypertension: Being managed by Dr. Delfina Redwood.   CT chest abdomen pelvis 10/18/2016: No evidence of metastatic disease  Return to clinic monthly for Herceptin and every 2 months for follow-up with me with labs. Next cancer will be done in April.  I spent 25 minutes talking to the patient of which more than half was spent in counseling and coordination of care.  No orders of the defined types were placed in this encounter.  The patient has a good understanding of the overall plan. she agrees with it. she will call with any problems that may develop before the next visit here.   Harriette Ohara, MD 01/11/17

## 2017-01-11 NOTE — Telephone Encounter (Signed)
Scheduled appt per 12/26 los - patient to get an updated schedule in the treatment area - Per Rn Amy to have appts printed.

## 2017-01-26 DIAGNOSIS — Z853 Personal history of malignant neoplasm of breast: Secondary | ICD-10-CM | POA: Diagnosis not present

## 2017-01-26 DIAGNOSIS — E119 Type 2 diabetes mellitus without complications: Secondary | ICD-10-CM | POA: Diagnosis not present

## 2017-01-26 DIAGNOSIS — E663 Overweight: Secondary | ICD-10-CM | POA: Diagnosis not present

## 2017-01-26 DIAGNOSIS — R5383 Other fatigue: Secondary | ICD-10-CM | POA: Diagnosis not present

## 2017-01-26 DIAGNOSIS — I10 Essential (primary) hypertension: Secondary | ICD-10-CM | POA: Diagnosis not present

## 2017-01-26 DIAGNOSIS — R112 Nausea with vomiting, unspecified: Secondary | ICD-10-CM | POA: Diagnosis not present

## 2017-02-03 DIAGNOSIS — R110 Nausea: Secondary | ICD-10-CM | POA: Diagnosis not present

## 2017-02-03 DIAGNOSIS — C50919 Malignant neoplasm of unspecified site of unspecified female breast: Secondary | ICD-10-CM | POA: Diagnosis not present

## 2017-02-08 ENCOUNTER — Ambulatory Visit: Payer: PPO | Admitting: Hematology and Oncology

## 2017-02-08 ENCOUNTER — Inpatient Hospital Stay: Payer: PPO | Attending: Hematology and Oncology

## 2017-02-08 ENCOUNTER — Other Ambulatory Visit: Payer: PPO

## 2017-02-08 VITALS — BP 109/86 | HR 69 | Temp 95.7°F | Resp 17 | Wt 198.5 lb

## 2017-02-08 DIAGNOSIS — Z5112 Encounter for antineoplastic immunotherapy: Secondary | ICD-10-CM | POA: Insufficient documentation

## 2017-02-08 DIAGNOSIS — C50411 Malignant neoplasm of upper-outer quadrant of right female breast: Secondary | ICD-10-CM | POA: Insufficient documentation

## 2017-02-08 MED ORDER — HEPARIN SOD (PORK) LOCK FLUSH 100 UNIT/ML IV SOLN
500.0000 [IU] | Freq: Once | INTRAVENOUS | Status: AC | PRN
  Administered 2017-02-08: 500 [IU]
  Filled 2017-02-08: qty 5

## 2017-02-08 MED ORDER — SODIUM CHLORIDE 0.9 % IV SOLN
Freq: Once | INTRAVENOUS | Status: AC
  Administered 2017-02-08: 10:00:00 via INTRAVENOUS

## 2017-02-08 MED ORDER — DIPHENHYDRAMINE HCL 25 MG PO CAPS
ORAL_CAPSULE | ORAL | Status: AC
  Filled 2017-02-08: qty 1

## 2017-02-08 MED ORDER — ACETAMINOPHEN 325 MG PO TABS
650.0000 mg | ORAL_TABLET | Freq: Once | ORAL | Status: AC
  Administered 2017-02-08: 650 mg via ORAL

## 2017-02-08 MED ORDER — DIPHENHYDRAMINE HCL 25 MG PO CAPS
25.0000 mg | ORAL_CAPSULE | Freq: Once | ORAL | Status: AC
  Administered 2017-02-08: 25 mg via ORAL

## 2017-02-08 MED ORDER — SODIUM CHLORIDE 0.9 % IJ SOLN
10.0000 mL | INTRAMUSCULAR | Status: DC | PRN
  Administered 2017-02-08: 10 mL
  Filled 2017-02-08: qty 10

## 2017-02-08 MED ORDER — ACETAMINOPHEN 325 MG PO TABS
ORAL_TABLET | ORAL | Status: AC
  Filled 2017-02-08: qty 2

## 2017-02-08 MED ORDER — TRASTUZUMAB CHEMO 150 MG IV SOLR
600.0000 mg | Freq: Once | INTRAVENOUS | Status: AC
  Administered 2017-02-08: 600 mg via INTRAVENOUS
  Filled 2017-02-08: qty 28.57

## 2017-02-08 NOTE — Patient Instructions (Signed)
[facility] Discharge Instructions for Patients Receiving Chemotherapy  Today you received the following chemotherapy agents Herceptin  To help prevent nausea and vomiting after your treatment, we encourage you to take your nausea medication as directed   If you develop nausea and vomiting that is not controlled by your nausea medication, call the clinic.   BELOW ARE SYMPTOMS THAT SHOULD BE REPORTED IMMEDIATELY:  *FEVER GREATER THAN 100.5 F  *CHILLS WITH OR WITHOUT FEVER  NAUSEA AND VOMITING THAT IS NOT CONTROLLED WITH YOUR NAUSEA MEDICATION  *UNUSUAL SHORTNESS OF BREATH  *UNUSUAL BRUISING OR BLEEDING  TENDERNESS IN MOUTH AND THROAT WITH OR WITHOUT PRESENCE OF ULCERS  *URINARY PROBLEMS  *BOWEL PROBLEMS  UNUSUAL RASH Items with * indicate a potential emergency and should be followed up as soon as possible.  Feel free to call the clinic should you have any questions or concerns. The clinic phone number is [phone].  Please show the CHEMO ALERT CARD at check-in to the Emergency Department and triage nurse.   

## 2017-02-09 ENCOUNTER — Other Ambulatory Visit: Payer: Self-pay | Admitting: Gastroenterology

## 2017-02-09 DIAGNOSIS — R110 Nausea: Secondary | ICD-10-CM

## 2017-02-09 DIAGNOSIS — R634 Abnormal weight loss: Secondary | ICD-10-CM | POA: Diagnosis not present

## 2017-02-15 ENCOUNTER — Ambulatory Visit
Admission: RE | Admit: 2017-02-15 | Discharge: 2017-02-15 | Disposition: A | Discharge status: Home / Self Care | Payer: PPO | Source: Ambulatory Visit | Attending: Gastroenterology | Admitting: Gastroenterology

## 2017-02-15 DIAGNOSIS — R110 Nausea: Secondary | ICD-10-CM

## 2017-02-15 DIAGNOSIS — N200 Calculus of kidney: Secondary | ICD-10-CM | POA: Diagnosis not present

## 2017-02-17 DIAGNOSIS — D126 Benign neoplasm of colon, unspecified: Secondary | ICD-10-CM | POA: Diagnosis not present

## 2017-02-17 DIAGNOSIS — K2970 Gastritis, unspecified, without bleeding: Secondary | ICD-10-CM | POA: Diagnosis not present

## 2017-02-17 DIAGNOSIS — R634 Abnormal weight loss: Secondary | ICD-10-CM | POA: Diagnosis not present

## 2017-02-17 DIAGNOSIS — K2930 Chronic superficial gastritis without bleeding: Secondary | ICD-10-CM | POA: Diagnosis not present

## 2017-02-17 DIAGNOSIS — R110 Nausea: Secondary | ICD-10-CM | POA: Diagnosis not present

## 2017-02-17 DIAGNOSIS — K635 Polyp of colon: Secondary | ICD-10-CM | POA: Diagnosis not present

## 2017-02-17 DIAGNOSIS — K5730 Diverticulosis of large intestine without perforation or abscess without bleeding: Secondary | ICD-10-CM | POA: Diagnosis not present

## 2017-02-21 DIAGNOSIS — K2930 Chronic superficial gastritis without bleeding: Secondary | ICD-10-CM | POA: Diagnosis not present

## 2017-02-21 DIAGNOSIS — K635 Polyp of colon: Secondary | ICD-10-CM | POA: Diagnosis not present

## 2017-02-21 DIAGNOSIS — D126 Benign neoplasm of colon, unspecified: Secondary | ICD-10-CM | POA: Diagnosis not present

## 2017-03-07 ENCOUNTER — Inpatient Hospital Stay (HOSPITAL_COMMUNITY)
Admission: EM | Admit: 2017-03-07 | Discharge: 2017-04-14 | DRG: 025 | Disposition: A | Discharge status: Skilled Nursing Facility | Payer: PPO | Attending: Neurosurgery | Admitting: Neurosurgery

## 2017-03-07 ENCOUNTER — Telehealth: Payer: Self-pay

## 2017-03-07 ENCOUNTER — Emergency Department (HOSPITAL_COMMUNITY): Payer: PPO

## 2017-03-07 ENCOUNTER — Other Ambulatory Visit: Payer: Self-pay

## 2017-03-07 ENCOUNTER — Inpatient Hospital Stay (HOSPITAL_COMMUNITY): Payer: PPO

## 2017-03-07 ENCOUNTER — Encounter (HOSPITAL_COMMUNITY): Payer: Self-pay | Admitting: Emergency Medicine

## 2017-03-07 DIAGNOSIS — R739 Hyperglycemia, unspecified: Secondary | ICD-10-CM

## 2017-03-07 DIAGNOSIS — E119 Type 2 diabetes mellitus without complications: Secondary | ICD-10-CM

## 2017-03-07 DIAGNOSIS — Z880 Allergy status to penicillin: Secondary | ICD-10-CM

## 2017-03-07 DIAGNOSIS — E1165 Type 2 diabetes mellitus with hyperglycemia: Secondary | ICD-10-CM | POA: Diagnosis not present

## 2017-03-07 DIAGNOSIS — H4902 Third [oculomotor] nerve palsy, left eye: Secondary | ICD-10-CM | POA: Diagnosis not present

## 2017-03-07 DIAGNOSIS — G936 Cerebral edema: Secondary | ICD-10-CM

## 2017-03-07 DIAGNOSIS — G9340 Encephalopathy, unspecified: Secondary | ICD-10-CM | POA: Diagnosis not present

## 2017-03-07 DIAGNOSIS — R278 Other lack of coordination: Secondary | ICD-10-CM | POA: Diagnosis not present

## 2017-03-07 DIAGNOSIS — E1169 Type 2 diabetes mellitus with other specified complication: Secondary | ICD-10-CM

## 2017-03-07 DIAGNOSIS — T380X5A Adverse effect of glucocorticoids and synthetic analogues, initial encounter: Secondary | ICD-10-CM | POA: Diagnosis not present

## 2017-03-07 DIAGNOSIS — L8990 Pressure ulcer of unspecified site, unspecified stage: Secondary | ICD-10-CM

## 2017-03-07 DIAGNOSIS — J439 Emphysema, unspecified: Secondary | ICD-10-CM | POA: Diagnosis not present

## 2017-03-07 DIAGNOSIS — G9389 Other specified disorders of brain: Secondary | ICD-10-CM | POA: Diagnosis not present

## 2017-03-07 DIAGNOSIS — G9600 Cerebrospinal fluid leak, unspecified: Secondary | ICD-10-CM | POA: Diagnosis present

## 2017-03-07 DIAGNOSIS — Z7189 Other specified counseling: Secondary | ICD-10-CM | POA: Diagnosis not present

## 2017-03-07 DIAGNOSIS — Z87891 Personal history of nicotine dependence: Secondary | ICD-10-CM

## 2017-03-07 DIAGNOSIS — I10 Essential (primary) hypertension: Secondary | ICD-10-CM

## 2017-03-07 DIAGNOSIS — G9782 Other postprocedural complications and disorders of nervous system: Secondary | ICD-10-CM | POA: Diagnosis not present

## 2017-03-07 DIAGNOSIS — C7931 Secondary malignant neoplasm of brain: Secondary | ICD-10-CM | POA: Diagnosis not present

## 2017-03-07 DIAGNOSIS — D638 Anemia in other chronic diseases classified elsewhere: Secondary | ICD-10-CM | POA: Diagnosis present

## 2017-03-07 DIAGNOSIS — Z66 Do not resuscitate: Secondary | ICD-10-CM | POA: Diagnosis present

## 2017-03-07 DIAGNOSIS — G9619 Other disorders of meninges, not elsewhere classified: Secondary | ICD-10-CM | POA: Diagnosis not present

## 2017-03-07 DIAGNOSIS — Z882 Allergy status to sulfonamides status: Secondary | ICD-10-CM

## 2017-03-07 DIAGNOSIS — Z111 Encounter for screening for respiratory tuberculosis: Secondary | ICD-10-CM | POA: Diagnosis not present

## 2017-03-07 DIAGNOSIS — K2970 Gastritis, unspecified, without bleeding: Secondary | ICD-10-CM | POA: Diagnosis present

## 2017-03-07 DIAGNOSIS — G8918 Other acute postprocedural pain: Secondary | ICD-10-CM | POA: Diagnosis not present

## 2017-03-07 DIAGNOSIS — Y839 Surgical procedure, unspecified as the cause of abnormal reaction of the patient, or of later complication, without mention of misadventure at the time of the procedure: Secondary | ICD-10-CM | POA: Diagnosis not present

## 2017-03-07 DIAGNOSIS — R52 Pain, unspecified: Secondary | ICD-10-CM | POA: Diagnosis not present

## 2017-03-07 DIAGNOSIS — R55 Syncope and collapse: Secondary | ICD-10-CM | POA: Diagnosis present

## 2017-03-07 DIAGNOSIS — Z515 Encounter for palliative care: Secondary | ICD-10-CM

## 2017-03-07 DIAGNOSIS — E669 Obesity, unspecified: Secondary | ICD-10-CM | POA: Diagnosis not present

## 2017-03-07 DIAGNOSIS — E860 Dehydration: Secondary | ICD-10-CM | POA: Diagnosis present

## 2017-03-07 DIAGNOSIS — E875 Hyperkalemia: Secondary | ICD-10-CM | POA: Diagnosis not present

## 2017-03-07 DIAGNOSIS — Z9889 Other specified postprocedural states: Secondary | ICD-10-CM | POA: Diagnosis not present

## 2017-03-07 DIAGNOSIS — C787 Secondary malignant neoplasm of liver and intrahepatic bile duct: Secondary | ICD-10-CM | POA: Diagnosis not present

## 2017-03-07 DIAGNOSIS — D62 Acute posthemorrhagic anemia: Secondary | ICD-10-CM | POA: Diagnosis not present

## 2017-03-07 DIAGNOSIS — Z79899 Other long term (current) drug therapy: Secondary | ICD-10-CM

## 2017-03-07 DIAGNOSIS — Z1501 Genetic susceptibility to malignant neoplasm of breast: Secondary | ICD-10-CM

## 2017-03-07 DIAGNOSIS — R260 Ataxic gait: Secondary | ICD-10-CM | POA: Diagnosis not present

## 2017-03-07 DIAGNOSIS — Z170 Estrogen receptor positive status [ER+]: Secondary | ICD-10-CM | POA: Diagnosis not present

## 2017-03-07 DIAGNOSIS — R4781 Slurred speech: Secondary | ICD-10-CM | POA: Diagnosis not present

## 2017-03-07 DIAGNOSIS — Y92230 Patient room in hospital as the place of occurrence of the external cause: Secondary | ICD-10-CM | POA: Diagnosis not present

## 2017-03-07 DIAGNOSIS — H532 Diplopia: Secondary | ICD-10-CM | POA: Diagnosis not present

## 2017-03-07 DIAGNOSIS — Z8249 Family history of ischemic heart disease and other diseases of the circulatory system: Secondary | ICD-10-CM

## 2017-03-07 DIAGNOSIS — C799 Secondary malignant neoplasm of unspecified site: Secondary | ICD-10-CM | POA: Diagnosis not present

## 2017-03-07 DIAGNOSIS — E876 Hypokalemia: Secondary | ICD-10-CM | POA: Diagnosis not present

## 2017-03-07 DIAGNOSIS — G8929 Other chronic pain: Secondary | ICD-10-CM | POA: Diagnosis present

## 2017-03-07 DIAGNOSIS — R531 Weakness: Secondary | ICD-10-CM

## 2017-03-07 DIAGNOSIS — C7800 Secondary malignant neoplasm of unspecified lung: Secondary | ICD-10-CM | POA: Diagnosis not present

## 2017-03-07 DIAGNOSIS — S062X9D Diffuse traumatic brain injury with loss of consciousness of unspecified duration, subsequent encounter: Secondary | ICD-10-CM | POA: Diagnosis not present

## 2017-03-07 DIAGNOSIS — I629 Nontraumatic intracranial hemorrhage, unspecified: Secondary | ICD-10-CM | POA: Diagnosis not present

## 2017-03-07 DIAGNOSIS — Z6836 Body mass index (BMI) 36.0-36.9, adult: Secondary | ICD-10-CM

## 2017-03-07 DIAGNOSIS — R630 Anorexia: Secondary | ICD-10-CM | POA: Diagnosis present

## 2017-03-07 DIAGNOSIS — E639 Nutritional deficiency, unspecified: Secondary | ICD-10-CM | POA: Diagnosis not present

## 2017-03-07 DIAGNOSIS — Z9221 Personal history of antineoplastic chemotherapy: Secondary | ICD-10-CM

## 2017-03-07 DIAGNOSIS — E785 Hyperlipidemia, unspecified: Secondary | ICD-10-CM | POA: Diagnosis not present

## 2017-03-07 DIAGNOSIS — C716 Malignant neoplasm of cerebellum: Secondary | ICD-10-CM | POA: Diagnosis not present

## 2017-03-07 DIAGNOSIS — Z4789 Encounter for other orthopedic aftercare: Secondary | ICD-10-CM | POA: Diagnosis not present

## 2017-03-07 DIAGNOSIS — D72829 Elevated white blood cell count, unspecified: Secondary | ICD-10-CM | POA: Diagnosis not present

## 2017-03-07 DIAGNOSIS — R29818 Other symptoms and signs involving the nervous system: Secondary | ICD-10-CM | POA: Diagnosis not present

## 2017-03-07 DIAGNOSIS — R471 Dysarthria and anarthria: Secondary | ICD-10-CM | POA: Diagnosis present

## 2017-03-07 DIAGNOSIS — M6281 Muscle weakness (generalized): Secondary | ICD-10-CM | POA: Diagnosis not present

## 2017-03-07 DIAGNOSIS — K5900 Constipation, unspecified: Secondary | ICD-10-CM | POA: Diagnosis not present

## 2017-03-07 DIAGNOSIS — G4700 Insomnia, unspecified: Secondary | ICD-10-CM | POA: Diagnosis present

## 2017-03-07 DIAGNOSIS — R51 Headache: Secondary | ICD-10-CM | POA: Diagnosis not present

## 2017-03-07 DIAGNOSIS — R404 Transient alteration of awareness: Secondary | ICD-10-CM | POA: Diagnosis not present

## 2017-03-07 DIAGNOSIS — K429 Umbilical hernia without obstruction or gangrene: Secondary | ICD-10-CM | POA: Diagnosis not present

## 2017-03-07 DIAGNOSIS — R2689 Other abnormalities of gait and mobility: Secondary | ICD-10-CM | POA: Diagnosis not present

## 2017-03-07 DIAGNOSIS — C50919 Malignant neoplasm of unspecified site of unspecified female breast: Secondary | ICD-10-CM | POA: Diagnosis not present

## 2017-03-07 DIAGNOSIS — G939 Disorder of brain, unspecified: Secondary | ICD-10-CM | POA: Diagnosis not present

## 2017-03-07 DIAGNOSIS — C7951 Secondary malignant neoplasm of bone: Secondary | ICD-10-CM | POA: Diagnosis not present

## 2017-03-07 DIAGNOSIS — L89309 Pressure ulcer of unspecified buttock, unspecified stage: Secondary | ICD-10-CM | POA: Diagnosis not present

## 2017-03-07 DIAGNOSIS — R197 Diarrhea, unspecified: Secondary | ICD-10-CM | POA: Diagnosis not present

## 2017-03-07 DIAGNOSIS — C50411 Malignant neoplasm of upper-outer quadrant of right female breast: Secondary | ICD-10-CM | POA: Diagnosis not present

## 2017-03-07 DIAGNOSIS — G629 Polyneuropathy, unspecified: Secondary | ICD-10-CM | POA: Diagnosis not present

## 2017-03-07 DIAGNOSIS — I959 Hypotension, unspecified: Secondary | ICD-10-CM | POA: Diagnosis not present

## 2017-03-07 DIAGNOSIS — D696 Thrombocytopenia, unspecified: Secondary | ICD-10-CM | POA: Diagnosis not present

## 2017-03-07 DIAGNOSIS — B379 Candidiasis, unspecified: Secondary | ICD-10-CM | POA: Diagnosis not present

## 2017-03-07 DIAGNOSIS — Z853 Personal history of malignant neoplasm of breast: Secondary | ICD-10-CM

## 2017-03-07 LAB — BASIC METABOLIC PANEL
ANION GAP: 13 (ref 5–15)
BUN: 9 mg/dL (ref 6–20)
CO2: 25 mmol/L (ref 22–32)
Calcium: 9.5 mg/dL (ref 8.9–10.3)
Chloride: 104 mmol/L (ref 101–111)
Creatinine, Ser: 1.12 mg/dL — ABNORMAL HIGH (ref 0.44–1.00)
GFR calc Af Amer: 58 mL/min — ABNORMAL LOW (ref >60)
GFR, EST NON AFRICAN AMERICAN: 50 mL/min — AB (ref >60)
Glucose, Bld: 191 mg/dL — ABNORMAL HIGH (ref 65–99)
POTASSIUM: 3.9 mmol/L (ref 3.5–5.1)
SODIUM: 142 mmol/L (ref 135–145)

## 2017-03-07 LAB — BLOOD GAS, VENOUS
ACID-BASE EXCESS: 0.6 mmol/L (ref 0.0–2.0)
BICARBONATE: 25.2 mmol/L (ref 20.0–28.0)
O2 SAT: 40.1 %
PATIENT TEMPERATURE: 98.6
PCO2 VEN: 42.8 mmHg — AB (ref 44.0–60.0)
pH, Ven: 7.388 (ref 7.250–7.430)

## 2017-03-07 LAB — CBC WITH DIFFERENTIAL/PLATELET
BASOS ABS: 0 10*3/uL (ref 0.0–0.1)
Basophils Relative: 0 %
EOS ABS: 0 10*3/uL (ref 0.0–0.7)
Eosinophils Relative: 0 %
HCT: 44.2 % (ref 36.0–46.0)
Hemoglobin: 14.5 g/dL (ref 12.0–15.0)
LYMPHS PCT: 18 %
Lymphs Abs: 1.5 10*3/uL (ref 0.7–4.0)
MCH: 31 pg (ref 26.0–34.0)
MCHC: 32.8 g/dL (ref 30.0–36.0)
MCV: 94.6 fL (ref 78.0–100.0)
Monocytes Absolute: 0.4 10*3/uL (ref 0.1–1.0)
Monocytes Relative: 5 %
NEUTROS PCT: 77 %
Neutro Abs: 6.6 10*3/uL (ref 1.7–7.7)
PLATELETS: 206 10*3/uL (ref 150–400)
RBC: 4.67 MIL/uL (ref 3.87–5.11)
RDW: 15.3 % (ref 11.5–15.5)
WBC: 8.5 10*3/uL (ref 4.0–10.5)

## 2017-03-07 LAB — CBG MONITORING, ED
GLUCOSE-CAPILLARY: 190 mg/dL — AB (ref 65–99)
Glucose-Capillary: 119 mg/dL — ABNORMAL HIGH (ref 65–99)

## 2017-03-07 LAB — LACTIC ACID, PLASMA
LACTIC ACID, VENOUS: 2.6 mmol/L — AB (ref 0.5–1.9)
LACTIC ACID, VENOUS: 3.5 mmol/L — AB (ref 0.5–1.9)

## 2017-03-07 LAB — GLUCOSE, CAPILLARY
GLUCOSE-CAPILLARY: 272 mg/dL — AB (ref 65–99)
Glucose-Capillary: 286 mg/dL — ABNORMAL HIGH (ref 65–99)

## 2017-03-07 LAB — I-STAT CG4 LACTIC ACID, ED
LACTIC ACID, VENOUS: 3.64 mmol/L — AB (ref 0.5–1.9)
Lactic Acid, Venous: 2.2 mmol/L (ref 0.5–1.9)

## 2017-03-07 LAB — COOXEMETRY PANEL
Carboxyhemoglobin: 0.7 % (ref 0.5–1.5)
Methemoglobin: 1 % (ref 0.0–1.5)
O2 Saturation: 42 %
Total hemoglobin: 13 g/dL (ref 12.0–16.0)

## 2017-03-07 MED ORDER — ENSURE ENLIVE PO LIQD
237.0000 mL | Freq: Two times a day (BID) | ORAL | Status: DC
  Administered 2017-03-08 – 2017-03-21 (×22): 237 mL via ORAL

## 2017-03-07 MED ORDER — DEXAMETHASONE SODIUM PHOSPHATE 10 MG/ML IJ SOLN
10.0000 mg | Freq: Once | INTRAMUSCULAR | Status: AC
  Administered 2017-03-07: 10 mg via INTRAVENOUS
  Filled 2017-03-07: qty 1

## 2017-03-07 MED ORDER — SODIUM CHLORIDE 0.9 % IV SOLN
INTRAVENOUS | Status: AC
  Administered 2017-03-07: 22:00:00 via INTRAVENOUS

## 2017-03-07 MED ORDER — SODIUM CHLORIDE 0.9 % IV BOLUS (SEPSIS)
1000.0000 mL | Freq: Once | INTRAVENOUS | Status: AC
  Administered 2017-03-07: 1000 mL via INTRAVENOUS

## 2017-03-07 MED ORDER — ENOXAPARIN SODIUM 40 MG/0.4ML ~~LOC~~ SOLN
40.0000 mg | SUBCUTANEOUS | Status: AC
  Administered 2017-03-07 – 2017-03-11 (×5): 40 mg via SUBCUTANEOUS
  Filled 2017-03-07 (×5): qty 0.4

## 2017-03-07 MED ORDER — INSULIN ASPART 100 UNIT/ML ~~LOC~~ SOLN
0.0000 [IU] | Freq: Three times a day (TID) | SUBCUTANEOUS | Status: DC
  Administered 2017-03-08 – 2017-03-09 (×5): 3 [IU] via SUBCUTANEOUS
  Administered 2017-03-09: 8 [IU] via SUBCUTANEOUS
  Administered 2017-03-10: 11 [IU] via SUBCUTANEOUS
  Administered 2017-03-10: 5 [IU] via SUBCUTANEOUS
  Administered 2017-03-10: 2 [IU] via SUBCUTANEOUS
  Administered 2017-03-11 (×2): 3 [IU] via SUBCUTANEOUS

## 2017-03-07 MED ORDER — ACETAMINOPHEN 325 MG PO TABS
650.0000 mg | ORAL_TABLET | Freq: Four times a day (QID) | ORAL | Status: DC | PRN
  Administered 2017-03-21 – 2017-03-22 (×2): 650 mg via ORAL
  Filled 2017-03-07 (×3): qty 2

## 2017-03-07 MED ORDER — ONDANSETRON HCL 4 MG PO TABS: 4.0000 mg | ORAL_TABLET | Freq: Four times a day (QID) | ORAL | Status: DC | PRN

## 2017-03-07 MED ORDER — PROCHLORPERAZINE MALEATE 10 MG PO TABS
10.0000 mg | ORAL_TABLET | Freq: Four times a day (QID) | ORAL | Status: DC | PRN
  Administered 2017-03-11: 10 mg via ORAL
  Filled 2017-03-07 (×2): qty 1

## 2017-03-07 MED ORDER — ORAL CARE MOUTH RINSE
15.0000 mL | Freq: Two times a day (BID) | OROMUCOSAL | Status: DC
  Administered 2017-03-08 – 2017-03-21 (×11): 15 mL via OROMUCOSAL

## 2017-03-07 MED ORDER — ACETAMINOPHEN 650 MG RE SUPP: 650.0000 mg | Freq: Four times a day (QID) | RECTAL | Status: DC | PRN

## 2017-03-07 MED ORDER — LORAZEPAM 2 MG/ML IJ SOLN
1.0000 mg | Freq: Every day | INTRAMUSCULAR | Status: DC | PRN
  Administered 2017-03-07 – 2017-03-13 (×5): 1 mg via INTRAVENOUS
  Filled 2017-03-07 (×5): qty 1

## 2017-03-07 MED ORDER — GADOBENATE DIMEGLUMINE 529 MG/ML IV SOLN
15.0000 mL | Freq: Once | INTRAVENOUS | Status: AC | PRN
  Administered 2017-03-07: 18 mL via INTRAVENOUS

## 2017-03-07 MED ORDER — CHLORHEXIDINE GLUCONATE 0.12 % MT SOLN
15.0000 mL | Freq: Two times a day (BID) | OROMUCOSAL | Status: DC
  Administered 2017-03-07 – 2017-03-30 (×43): 15 mL via OROMUCOSAL
  Filled 2017-03-07 (×37): qty 15

## 2017-03-07 MED ORDER — ONDANSETRON HCL 4 MG/2ML IJ SOLN: 4.0000 mg | Freq: Four times a day (QID) | INTRAMUSCULAR | Status: DC | PRN

## 2017-03-07 NOTE — ED Notes (Signed)
ED TO INPATIENT HANDOFF REPORT  Name/Age/Gender Denise Morton 67 y.o. female  Code Status Code Status History    This patient does not have a recorded code status. Please follow your organizational policy for patients in this situation.    Advance Directive Documentation     Most Recent Value  Type of Advance Directive  Healthcare Power of Attorney  Pre-existing out of facility DNR order (yellow form or pink MOST form)  No data  "MOST" Form in Place?  No data      Home/SNF/Other Home  Chief Complaint hypotension  Level of Care/Admitting Diagnosis ED Disposition    ED Disposition Condition Comment   Admit  Hospital Area: [facility] [[id number]]  Level of Care: Telemetry [5]  Admit to tele based on following criteria: Eval of Syncope  Diagnosis: Syncope [659935]  Admitting Physician: Elwin Mocha [[id number]]  Attending Physician: HOBBS, PHILLIP Jerilynn Mages [[id number]]  Estimated length of stay: 3 - 4 days  Certification:: I certify this patient will need inpatient services for at least 2 midnights  PT Class (Do Not Modify): Inpatient [101]  PT Acc Code (Do Not Modify): Private [1]       Medical History Past Medical History:  Diagnosis Date  . CRVO (central retinal vein occlusion)   . Diabetes mellitus without complication ([place])   . Metastatic cancer ([place]) dx'd 07/2014   liver, lung and bone  . right breast ca dx'd 07/2014    Allergies Allergies  Allergen Reactions  . Penicillin G Rash    Has patient had a PCN reaction causing immediate rash, facial/tongue/throat swelling, SOB or lightheadedness with hypotension: Unknown Has patient had a PCN reaction causing severe rash involving mucus membranes or skin necrosis: Unknown Has patient had a PCN reaction that required hospitalization: Unknown Has patient had a PCN reaction occurring within the last 10 years: Unknown If all of the above answers are "NO", then may proceed with Cephalosporin use.   . Sulfa  Antibiotics Rash    IV Location/Drains/Wounds Patient Lines/Drains/Airways Status   Active Line/Drains/Airways    Name:   Placement date:   Placement time:   Site:   Days:   Implanted Port 08/25/14 Left Chest   08/25/14    1523    Chest   925   Peripheral IV   -    -    -      Peripheral IV 03/07/17 Left Antecubital   03/07/17    1323    Antecubital   less than 1          Labs/Imaging Results for orders placed or performed during the hospital encounter of 03/07/17 (from the past 48 hour(s))  CBG monitoring, ED     Status: Abnormal   Collection Time: 03/07/17  1:29 PM  Result Value Ref Range   Glucose-Capillary 190 (H) 65 - 99 mg/dL  CBC with Differential     Status: None   Collection Time: 03/07/17  1:36 PM  Result Value Ref Range   WBC 8.5 4.0 - 10.5 K/uL   RBC 4.67 3.87 - 5.11 MIL/uL   Hemoglobin 14.5 12.0 - 15.0 g/dL   HCT 44.2 36.0 - 46.0 %   MCV 94.6 78.0 - 100.0 fL   MCH 31.0 26.0 - 34.0 pg   MCHC 32.8 30.0 - 36.0 g/dL   RDW 15.3 11.5 - 15.5 %   Platelets 206 150 - 400 K/uL   Neutrophils Relative % 77 %   Neutro Abs 6.6  1.7 - 7.7 K/uL   Lymphocytes Relative 18 %   Lymphs Abs 1.5 0.7 - 4.0 K/uL   Monocytes Relative 5 %   Monocytes Absolute 0.4 0.1 - 1.0 K/uL   Eosinophils Relative 0 %   Eosinophils Absolute 0.0 0.0 - 0.7 K/uL   Basophils Relative 0 %   Basophils Absolute 0.0 0.0 - 0.1 K/uL    Comment: Performed at [facility], [place] [address] [place], [place] [address]  Basic metabolic panel     Status: Abnormal   Collection Time: 03/07/17  1:36 PM  Result Value Ref Range   Sodium 142 135 - 145 mmol/L   Potassium 3.9 3.5 - 5.1 mmol/L   Chloride 104 101 - 111 mmol/L   CO2 25 22 - 32 mmol/L   Glucose, Bld 191 (H) 65 - 99 mg/dL   BUN 9 6 - 20 mg/dL   Creatinine, Ser 1.12 (H) 0.44 - 1.00 mg/dL   Calcium 9.5 8.9 - 10.3 mg/dL   GFR calc non Af Amer 50 (L) >60 mL/min   GFR calc Af Amer 58 (L) >60 mL/min    Comment: (NOTE) The eGFR has been  calculated using the CKD EPI equation. This calculation has not been validated in all clinical situations. eGFR's persistently <60 mL/min signify possible Chronic Kidney Disease.    Anion gap 13 5 - 15    Comment: Performed at [facility], [place] [address] [place], [place] [address]  I-Stat CG4 Lactic Acid, ED     Status: Abnormal   Collection Time: 03/07/17  1:49 PM  Result Value Ref Range   Lactic Acid, Venous 3.64 (HH) 0.5 - 1.9 mmol/L   Comment NOTIFIED PHYSICIAN   Blood gas, venous     Status: Abnormal   Collection Time: 03/07/17  2:53 PM  Result Value Ref Range   pH, Ven 7.388 7.250 - 7.430   pCO2, Ven 42.8 (L) 44.0 - 60.0 mmHg   pO2, Ven below reportbale range 32.0 - 45.0 mmHg    Comment: rbv dan floyd md by angie dunlap rrt rcp on 03/07/17 at 1456   Bicarbonate 25.2 20.0 - 28.0 mmol/L   Acid-Base Excess 0.6 0.0 - 2.0 mmol/L   O2 Saturation 40.1 %   Patient temperature 98.6    Collection site VEIN    Drawn by DRAWN BY RN    Sample type VENOUS     Comment: Performed at [facility], [place] [address], [place], [place] [address]  .Cooxemetry Panel (carboxy, met, total hgb, O2 sat)     Status: None   Collection Time: 03/07/17  2:58 PM  Result Value Ref Range   Total hemoglobin 13.0 12.0 - 16.0 g/dL   O2 Saturation 42.0 %   Carboxyhemoglobin 0.7 0.5 - 1.5 %   Methemoglobin 1.0 0.0 - 1.5 %    Comment: Performed at [facility], [place] [address], [place], [place] [address]  I-Stat CG4 Lactic Acid, ED     Status: Abnormal   Collection Time: 03/07/17  4:21 PM  Result Value Ref Range   Lactic Acid, Venous 2.20 (HH) 0.5 - 1.9 mmol/L   Comment NOTIFIED PHYSICIAN    Ct Head Wo Contrast  Addendum Date: 03/07/2017   ADDENDUM REPORT: 03/07/2017 15:42 ADDENDUM: These results were called by telephone at the time of interpretation on 03/07/2017 at 3:42 pm to Dr. Deno Etienne , who verbally acknowledged these results. Electronically Signed    By: Seward Meth  Augustin Coupe M.D.   On: 03/07/2017 15:42   Result Date: 03/07/2017 CLINICAL DATA:  Slurred speech today at doctor's office. History of breast cancer. EXAM: CT HEAD WITHOUT CONTRAST TECHNIQUE: Contiguous axial images were obtained from the base of the skull through the vertex without intravenous contrast. COMPARISON:  None. FINDINGS: Brain: There are multiple calcifications of the right cerebellum with low-density and changes of edema involving the bilateral cerebellum. There is bilateral periventricular white matter small vessel ischemic change. There is no midline shift or hydrocephalus. No acute hemorrhage is identified. Vascular: No hyperdense vessel or unexpected calcification. Skull: Normal. Negative for fracture or focal lesion. Sinuses/Orbits: No acute finding. Other: None. IMPRESSION: Multiple calcifications of right cerebellum with low-density in changes of edema involving the bilateral cerebellum. Metastatic disease is suspected. Further evaluation with MRI of the brain is recommended. Electronically Signed: By: Abelardo Diesel M.D. On: 03/07/2017 15:32    Pending Labs Unresulted Labs (From admission, onward)   None      Vitals/Pain Today's Vitals   03/07/17 1319 03/07/17 1326 03/07/17 1330 03/07/17 1536  BP:  132/88  (!) 177/90  Pulse:  65  (!) 123  Resp:  20  14  Temp:  (!) 97.3 F (36.3 C)    TempSrc:  Oral    SpO2: 99% 99%  97%  Weight:   198 lb (89.8 kg)   Height:   _0  (1.702 m)     Isolation Precautions No active isolations  Medications Medications  insulin aspart (novoLOG) injection 0-15 Units (not administered)  sodium chloride 0.9 % bolus 1,000 mL (0 mLs Intravenous Stopped 03/07/17 1612)  sodium chloride 0.9 % bolus 1,000 mL (0 mLs Intravenous Stopped 03/07/17 1612)  dexamethasone (DECADRON) injection 10 mg (10 mg Intravenous Given 03/07/17 1610)    Mobility cane

## 2017-03-07 NOTE — Telephone Encounter (Signed)
Returned pt husband call regarding pt being in the ED. Made Dr. Lindi Adie aware. Pt is being admitted. He will see her tomorrow in the hospital.  Cyndia Bent RN

## 2017-03-07 NOTE — ED Triage Notes (Signed)
Per Ems, patient comes from [facility] gastroenterology to get endoscopy results-polyps with gastritis. Pt drove herself to the appt but was weak on arrival. Called EMS for her being altered with slurred speech. On arrival, patient was responsive to voice, lethargic, SBP 68 sitting in wc. Once laid patient back, her BP came up and she was alert and oriented x4. Pt c/o general weakness. Pt sick off and on since nov. Hx breast CA. Hx diabetes, cbg 232.

## 2017-03-07 NOTE — H&P (Signed)
[facility] History and Physical  Denise Morton WSF:[id number] DOB: 04-Sep-1950 DOA: 03/07/2017  Referring physician:  PCP: Seward Carol, MD   Chief Complaint: "I was so weak."  HPI: Denise Morton is a 67 y.o. female  With pmhx of BRCA, DM, gastritis presents with weakness.  Patient states that for the past few months she has had decreased appetite and weakness.  With her primary care doctor for evaluation who sent her to a gastroenterologist.  There she was scoped and told she had severe gastritis.  Started on PPI.  Some relief.  Appetite did not return.  Over the last week patient with gradually worsening weakness.  Felt lightheaded and so she may have passed out into a chair.  Did not hit her head.  EMS activated.  ED course: Patient given fluid resuscitation.  CT of the head showed likely metastatic disease.  MRI ordered and neurology consulted by EDP.  Hospitalist consulted for admission.  Neurology recommended patient remain at [facility] for oncological workup.   Review of Systems:  As per HPI otherwise 10 point review of systems negative.    Past Medical History:  Diagnosis Date  . CRVO (central retinal vein occlusion)   . Diabetes mellitus without complication ([place])   . Metastatic cancer ([place]) dx'd 07/2014   liver, lung and bone  . right breast ca dx'd 07/2014   Past Surgical History:  Procedure Laterality Date  . TONSILLECTOMY    . TUBAL LIGATION    . TUMOR REMOVAL     benign- 2 on leg, 1 in finger   Social History:  reports that she has quit smoking. She has a 15.00 pack-year smoking history. she has never used smokeless tobacco. She reports that she does not drink alcohol or use drugs.  Allergies  Allergen Reactions  . Penicillin G Rash    Has patient had a PCN reaction causing immediate rash, facial/tongue/throat swelling, SOB or lightheadedness with hypotension: Unknown Has patient had a PCN reaction causing severe rash involving mucus membranes or skin  necrosis: Unknown Has patient had a PCN reaction that required hospitalization: Unknown Has patient had a PCN reaction occurring within the last 10 years: Unknown If all of the above answers are "NO", then may proceed with Cephalosporin use.   . Sulfa Antibiotics Rash    Family History  Problem Relation Age of Onset  . Heart failure Father      Prior to Admission medications   Medication Sig Start Date End Date Taking? Authorizing Provider  carvedilol (COREG) 12.5 MG tablet Take 1 tablet (12.5 mg total) by mouth 2 (two) times daily with a meal. 04/03/15  Yes Susanne Borders, NP  gabapentin (NEURONTIN) 300 MG capsule TAKE ONE CAPSULE BY MOUTH THREE TIMES DAILY 05/03/16  Yes Nicholas Lose, MD  lidocaine-prilocaine (EMLA) cream APPLY TO THE AFFECTED AREA ONCE AS DIRECTED 03/28/16  Yes Nicholas Lose, MD  loperamide (IMODIUM) 2 MG capsule Take 2 mg by mouth as needed for diarrhea or loose stools.   Yes [provider]  Multiple Vitamin (MULTIVITAMIN WITH MINERALS) TABS tablet Take 1 tablet by mouth daily.   Yes [provider]  ondansetron (ZOFRAN) 8 MG tablet Take 1 tablet (8 mg total) by mouth every 8 (eight) hours as needed for nausea. 01/11/17  Yes Nicholas Lose, MD  prochlorperazine (COMPAZINE) 10 MG tablet TAKE 1 TABLET(10 MG) BY MOUTH EVERY 6 HOURS AS NEEDED FOR NAUSEA OR VOMITING 01/12/17  Yes Nicholas Lose, MD   Physical Exam: Vitals:  03/07/17 1319 03/07/17 1326 03/07/17 1330 03/07/17 1536  BP:  132/88  (!) 177/90  Pulse:  65  (!) 123  Resp:  20  14  Temp:  (!) 97.3 F (36.3 C)    TempSrc:  Oral    SpO2: 99% 99%  97%  Weight:   89.8 kg (198 lb)   Height:   _0  (1.702 m)     Wt Readings from Last 3 Encounters:  03/07/17 89.8 kg (198 lb)  02/08/17 90 kg (198 lb 8 oz)  01/11/17 103 kg (227 lb 1.6 oz)    General:  Appears calm and comfortable; A&Ox3 Eyes:  PERRL, EOMI, normal lids, iris ENT:  grossly normal hearing, lips & tongue Neck:  no LAD, masses  or thyromegaly Cardiovascular:  RRR, no m/r/g. No LE edema.  Respiratory:  CTA bilaterally, no w/r/r. Normal respiratory effort. Abdomen:  soft, ntnd Skin:  no rash or induration seen on limited exam Musculoskeletal:  grossly normal tone BUE/BLE Psychiatric:  grossly normal mood and affect, speech fluent and appropriate Neurologic:  CN 2-12 grossly intact, moves all extremities in coordinated fashion.          Labs on Admission:  Basic Metabolic Panel: Recent Labs  Lab 03/07/17 1336  NA 142  K 3.9  CL 104  CO2 25  GLUCOSE 191*  BUN 9  CREATININE 1.12*  CALCIUM 9.5   Liver Function Tests: No results for input(s): AST, ALT, ALKPHOS, BILITOT, PROT, ALBUMIN in the last 168 hours. No results for input(s): LIPASE, AMYLASE in the last 168 hours. No results for input(s): AMMONIA in the last 168 hours. CBC: Recent Labs  Lab 03/07/17 1336  WBC 8.5  NEUTROABS 6.6  HGB 14.5  HCT 44.2  MCV 94.6  PLT 206   Cardiac Enzymes: No results for input(s): CKTOTAL, CKMB, CKMBINDEX, TROPONINI in the last 168 hours.  BNP (last 3 results) No results for input(s): BNP in the last 8760 hours.  ProBNP (last 3 results) No results for input(s): PROBNP in the last 8760 hours.   Serum creatinine: 1.12 mg/dL (H) 03/07/17 1336 Estimated creatinine clearance: 56.9 mL/min (A)  CBG: Recent Labs  Lab 03/07/17 1329 03/07/17 1703  GLUCAP 190* 119*    Radiological Exams on Admission: Ct Head Wo Contrast  Addendum Date: 03/07/2017   ADDENDUM REPORT: 03/07/2017 15:42 ADDENDUM: These results were called by telephone at the time of interpretation on 03/07/2017 at 3:42 pm to Dr. Deno Etienne , who verbally acknowledged these results. Electronically Signed   By: Abelardo Diesel M.D.   On: 03/07/2017 15:42   Result Date: 03/07/2017 CLINICAL DATA:  Slurred speech today at doctor's office. History of breast cancer. EXAM: CT HEAD WITHOUT CONTRAST TECHNIQUE: Contiguous axial images were obtained from the base  of the skull through the vertex without intravenous contrast. COMPARISON:  None. FINDINGS: Brain: There are multiple calcifications of the right cerebellum with low-density and changes of edema involving the bilateral cerebellum. There is bilateral periventricular white matter small vessel ischemic change. There is no midline shift or hydrocephalus. No acute hemorrhage is identified. Vascular: No hyperdense vessel or unexpected calcification. Skull: Normal. Negative for fracture or focal lesion. Sinuses/Orbits: No acute finding. Other: None. IMPRESSION: Multiple calcifications of right cerebellum with low-density in changes of edema involving the bilateral cerebellum. Metastatic disease is suspected. Further evaluation with MRI of the brain is recommended. Electronically Signed: By: Abelardo Diesel M.D. On: 03/07/2017 15:32    EKG: Independently reviewed. NSR, no stemi.  Assessment/Plan Active Problems:   Syncope    Dehydration/weakness IVF 2L given in ED PT/OT eval LA pos, will trend  Hypertension When necessary hydralazine 10 mg IV as needed for severe blood pressure Hold Coreg  Slurred speech SLP consult Improved after steroids  Chronic Pain Hold gabapentin  Syncope ECHO in AM  Abn CT head MRI ordered Neuro consult Decadron given in ED  DM SSI AC  Code Status: FC  DVT Prophylaxis: heparin Family Communication: son at bedside Disposition Plan: Pending Improvement  Status: tele inpt  Elwin Mocha, MD Family Medicine [facility] [email or web address] Password [email or web address]

## 2017-03-07 NOTE — Progress Notes (Signed)
CRITICAL VALUE ALERT  Critical Value:  Lactic acid 2.6  Date & Time Notied:  03/07/2017 at 1926  Provider Notified: Dr. Aggie Moats  Orders Received/Actions taken: New orders given.

## 2017-03-07 NOTE — ED Provider Notes (Signed)
[place] DEPT Provider Note   CSN: [id number] Arrival date & time: 03/07/17  1246     History   Chief Complaint Chief Complaint  Patient presents with  . Weakness    HPI Denise Morton is a 67 y.o. female.  67 yo F with a chief complaint of progressive weakness.  This been going on for the past few months.  She has been seen by her PCP multiple times and has been referred to GI for endoscopy colonoscopy for decreased appetite.  She recently had this done and had the results given to her today.  When she was going to her appointment she ate some food in the car and then when she went to get out of the car she felt lightheaded like she may pass out.  She is able to make it into the office and then had to sit down on a wheelchair.  She was having difficulty talking at that time and when her blood pressure was checked it was in the 40J systolic.  They laid her back and she recovered spontaneously.  She thinks she still feels a little lightheaded when she stands up.  EMS was called and she was sent to the emergency department.  She denied chest pain shortness of breath lower extremity edema cough congestion fever abdominal pain vomiting diarrhea dysuria increased frequency or hesitancy.  Patient really has not had any significant symptoms other than her weakness and decreased appetite.  She thinks that this could be due to her being on Herceptin.  She denies sick contacts.  Was able to eat and drink okay this morning.   The history is provided by the patient.  Illness  This is a new problem. The current episode started more than 1 week ago. The problem occurs constantly. The problem has not changed since onset.Associated symptoms include headaches. Pertinent negatives include no chest pain and no shortness of breath. Nothing aggravates the symptoms. Nothing relieves the symptoms. She has tried nothing for the symptoms. The treatment provided no relief.    Past  Medical History:  Diagnosis Date  . CRVO (central retinal vein occlusion)   . Diabetes mellitus without complication ([place])   . 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 right breast ca dx'd 07/2014    Patient Active Problem List   Diagnosis Date Noted  . Chemotherapy-induced peripheral neuropathy ([place]) 04/22/2015  . Essential hypertension 04/03/2015  . Liver metastases ([place]) 03/11/2015  . Metastatic cancer ([place]) 01/29/2015  . Lung metastases ([place]) 10/20/2014  . Breast cancer of upper-outer quadrant of right female breast ([place]) 08/15/2014    Past Surgical History:  Procedure Laterality Date  . TONSILLECTOMY    . TUBAL LIGATION    . TUMOR REMOVAL     benign- 2 on leg, 1 in finger    OB History    No data available       Home Medications    Prior to Admission medications   Medication Sig Start Date End Date Taking? Authorizing Provider  carvedilol (COREG) 12.5 MG tablet Take 1 tablet (12.5 mg total) by mouth 2 (two) times daily with a meal. 04/03/15  Yes Susanne Borders, NP  gabapentin (NEURONTIN) 300 MG capsule TAKE ONE CAPSULE BY MOUTH THREE TIMES DAILY 05/03/16  Yes Nicholas Lose, MD  lidocaine-prilocaine (EMLA) cream APPLY TO THE AFFECTED AREA ONCE AS DIRECTED 03/28/16  Yes Nicholas Lose, MD  loperamide (IMODIUM) 2 MG capsule Take 2 mg  by mouth as needed for diarrhea or loose stools.   Yes [provider]  Multiple Vitamin (MULTIVITAMIN WITH MINERALS) TABS tablet Take 1 tablet by mouth daily.   Yes [provider]  ondansetron (ZOFRAN) 8 MG tablet Take 1 tablet (8 mg total) by mouth every 8 (eight) hours as needed for nausea. 01/11/17  Yes Nicholas Lose, MD  prochlorperazine (COMPAZINE) 10 MG tablet TAKE 1 TABLET(10 MG) BY MOUTH EVERY 6 HOURS AS NEEDED FOR NAUSEA OR VOMITING 01/12/17  Yes Nicholas Lose, MD    Family History Family History  Problem Relation Age of Onset  . Heart failure Father     Social History Social History    Tobacco Use  . Smoking status: Former Smoker    Packs/day: 0.50    Years: 30.00    Pack years: 15.00  . Smokeless tobacco: Never Used  Substance Use Topics  . Alcohol use: No  . Drug use: No     Allergies   Penicillin g and Sulfa antibiotics   Review of Systems Review of Systems  Constitutional: Negative for chills and fever.  HENT: Negative for congestion and rhinorrhea.   Eyes: Negative for redness and visual disturbance.  Respiratory: Negative for shortness of breath and wheezing.   Cardiovascular: Negative for chest pain and palpitations.  Gastrointestinal: Negative for nausea and vomiting.  Genitourinary: Negative for dysuria and urgency.  Musculoskeletal: Negative for arthralgias and myalgias.  Skin: Negative for pallor and wound.  Neurological: Positive for speech difficulty, weakness and headaches. Negative for dizziness.     Physical Exam Updated Vital Signs BP (!) 177/90   Pulse (!) 123   Temp (!) 97.3 F (36.3 C) (Oral)   Resp 14   Ht 5\' 7"  (1.702 m)   Wt 89.8 kg (198 lb)   SpO2 97%   BMI 31.01 kg/m   Physical Exam  Constitutional: She is oriented to person, place, and time. She appears well-developed and well-nourished. No distress.  HENT:  Head: Normocephalic and atraumatic.  Eyes: EOM are normal. Pupils are equal, round, and reactive to light.  Neck: Normal range of motion. Neck supple.  Cardiovascular: Normal rate and regular rhythm. Exam reveals no gallop and no friction rub.  No murmur heard. Pulmonary/Chest: Effort normal. She has no wheezes. She has no rales.  Abdominal: Soft. She exhibits no distension. There is no tenderness.  Musculoskeletal: She exhibits no edema or tenderness.  Neurological: She is alert and oriented to person, place, and time. She has normal strength. She displays a negative Romberg sign. Coordination and gait normal.  Skin: Skin is warm and dry. She is not diaphoretic.  Psychiatric: She has a normal mood and  affect. Her behavior is normal.  Nursing note and vitals reviewed.    ED Treatments / Results  Labs (all labs ordered are listed, but only abnormal results are displayed) Labs Reviewed  BASIC METABOLIC PANEL - Abnormal; Notable for the following components:      Result Value   Glucose, Bld 191 (*)    Creatinine, Ser 1.12 (*)    GFR calc non Af Amer 50 (*)    GFR calc Af Amer 58 (*)    All other components within normal limits  BLOOD GAS, VENOUS - Abnormal; Notable for the following components:   pCO2, Ven 42.8 (*)    All other components within normal limits  I-STAT CG4 LACTIC ACID, ED - Abnormal; Notable for the following components:   Lactic Acid, Venous 3.64 (*)  All other components within normal limits  CBG MONITORING, ED - Abnormal; Notable for the following components:   Glucose-Capillary 190 (*)    All other components within normal limits  CBC WITH DIFFERENTIAL/PLATELET  COOXEMETRY PANEL  I-STAT CG4 LACTIC ACID, ED    EKG  EKG Interpretation  Date/Time:  Tuesday March 07 2017 13:50:48 EST Ventricular Rate:  62 PR Interval:    QRS Duration: 115 QT Interval:  456 QTC Calculation: 464 R Axis:   55 Text Interpretation:  Sinus rhythm Nonspecific intraventricular conduction delay Baseline wander in lead(s) V1 No significant change since last tracing Confirmed by Deno Etienne [phone]) on 03/07/2017 2:12:50 PM       Radiology Ct Head Wo Contrast  Addendum Date: 03/07/2017   ADDENDUM REPORT: 03/07/2017 15:42 ADDENDUM: These results were called by telephone at the time of interpretation on 03/07/2017 at 3:42 pm to Dr. Deno Etienne , who verbally acknowledged these results. Electronically Signed   By: Abelardo Diesel M.D.   On: 03/07/2017 15:42   Result Date: 03/07/2017 CLINICAL DATA:  Slurred speech today at doctor's office. History of breast cancer. EXAM: CT HEAD WITHOUT CONTRAST TECHNIQUE: Contiguous axial images were obtained from the base of the skull through the  vertex without intravenous contrast. COMPARISON:  None. FINDINGS: Brain: There are multiple calcifications of the right cerebellum with low-density and changes of edema involving the bilateral cerebellum. There is bilateral periventricular white matter small vessel ischemic change. There is no midline shift or hydrocephalus. No acute hemorrhage is identified. Vascular: No hyperdense vessel or unexpected calcification. Skull: Normal. Negative for fracture or focal lesion. Sinuses/Orbits: No acute finding. Other: None. IMPRESSION: Multiple calcifications of right cerebellum with low-density in changes of edema involving the bilateral cerebellum. Metastatic disease is suspected. Further evaluation with MRI of the brain is recommended. Electronically Signed: By: Abelardo Diesel M.D. On: 03/07/2017 15:32    Procedures Procedures (including critical care time)  Medications Ordered in ED Medications  dexamethasone (DECADRON) injection 10 mg (not administered)  sodium chloride 0.9 % bolus 1,000 mL (1,000 mLs Intravenous New Bag/Given 03/07/17 1343)  sodium chloride 0.9 % bolus 1,000 mL (1,000 mLs Intravenous New Bag/Given 03/07/17 1424)     Initial Impression / Assessment and Plan / ED Course  I have reviewed the triage vital signs and the nursing notes.  Pertinent labs & imaging results that were available during my care of the patient were reviewed by me and considered in my medical decision making (see chart for details).     67 yo F with a chief complaint of transient weakness.  Patient has had progressive weakness over 3 or 4 months.  She had an episode today that sounds like a near syncopal event.    The patient is now back to normal.  Will obtain an EKG basic labs give a fluid bolus and ambulate.   The patient feels mildly better after a fluid bolus.  Her lactate was elevated at 3.6.  This brought up the possibility of a seizure though there is no noted focal shaking more of the loss of word  finding.  Discussed the case with neurology.  I doubt that this is an infection. Discussed with Dr. Malen Gauze, neurology, with her risk factors and word finding recommended TIA workup and admission at [facility].    CT with likely mets. Discussed with neuro who evaluated the images, recommended MR of brain wwo contrast.  Felt ok to keep at [facility].    The patients results and  plan were reviewed and discussed.   Any x-rays performed were independently reviewed by myself.   Differential diagnosis were considered with the presenting HPI.  Medications  dexamethasone (DECADRON) injection 10 mg (not administered)  sodium chloride 0.9 % bolus 1,000 mL (1,000 mLs Intravenous New Bag/Given 03/07/17 1343)  sodium chloride 0.9 % bolus 1,000 mL (1,000 mLs Intravenous New Bag/Given 03/07/17 1424)    Vitals:   03/07/17 1319 03/07/17 1326 03/07/17 1330 03/07/17 1536  BP:  132/88  (!) 177/90  Pulse:  65  (!) 123  Resp:  20  14  Temp:  (!) 97.3 F (36.3 C)    TempSrc:  Oral    SpO2: 99% 99%  97%  Weight:   89.8 kg (198 lb)   Height:   5\' 7"  (1.702 m)     Final diagnoses:  Slurred speech  Vasogenic edema (HCC)  Near syncope    Admission/ observation were discussed with the admitting physician, patient and/or family and they are comfortable with the plan.     Final Clinical Impressions(s) / ED Diagnoses   Final diagnoses:  Slurred speech  Vasogenic edema [facility])  Near syncope    ED Discharge Orders    None       Deno Etienne, DO 03/07/17 1609

## 2017-03-07 NOTE — Progress Notes (Signed)
Patient claustrophobic. Ativan ordered for MRI.

## 2017-03-08 ENCOUNTER — Ambulatory Visit: Payer: PPO

## 2017-03-08 ENCOUNTER — Inpatient Hospital Stay (HOSPITAL_COMMUNITY): Payer: PPO

## 2017-03-08 ENCOUNTER — Other Ambulatory Visit: Payer: PPO

## 2017-03-08 ENCOUNTER — Encounter: Payer: PPO | Admitting: Nutrition

## 2017-03-08 ENCOUNTER — Other Ambulatory Visit: Payer: Self-pay | Admitting: Neurosurgery

## 2017-03-08 ENCOUNTER — Ambulatory Visit: Payer: PPO | Admitting: Hematology and Oncology

## 2017-03-08 DIAGNOSIS — C7951 Secondary malignant neoplasm of bone: Secondary | ICD-10-CM

## 2017-03-08 DIAGNOSIS — R55 Syncope and collapse: Secondary | ICD-10-CM

## 2017-03-08 DIAGNOSIS — C50411 Malignant neoplasm of upper-outer quadrant of right female breast: Secondary | ICD-10-CM

## 2017-03-08 DIAGNOSIS — C787 Secondary malignant neoplasm of liver and intrahepatic bile duct: Secondary | ICD-10-CM

## 2017-03-08 DIAGNOSIS — R260 Ataxic gait: Secondary | ICD-10-CM

## 2017-03-08 DIAGNOSIS — R4781 Slurred speech: Secondary | ICD-10-CM

## 2017-03-08 LAB — ECHOCARDIOGRAM COMPLETE
Height: 67 in
WEIGHTICAEL: 3548.52 [oz_av]

## 2017-03-08 LAB — BASIC METABOLIC PANEL
ANION GAP: 8 (ref 5–15)
BUN: 10 mg/dL (ref 6–20)
CALCIUM: 7.8 mg/dL — AB (ref 8.9–10.3)
CO2: 19 mmol/L — ABNORMAL LOW (ref 22–32)
CREATININE: 0.65 mg/dL (ref 0.44–1.00)
Chloride: 114 mmol/L — ABNORMAL HIGH (ref 101–111)
Glucose, Bld: 176 mg/dL — ABNORMAL HIGH (ref 65–99)
Potassium: 3.8 mmol/L (ref 3.5–5.1)
SODIUM: 141 mmol/L (ref 135–145)

## 2017-03-08 LAB — LACTIC ACID, PLASMA
LACTIC ACID, VENOUS: 1.4 mmol/L (ref 0.5–1.9)
LACTIC ACID, VENOUS: 3.3 mmol/L — AB (ref 0.5–1.9)

## 2017-03-08 LAB — GLUCOSE, CAPILLARY
GLUCOSE-CAPILLARY: 163 mg/dL — AB (ref 65–99)
GLUCOSE-CAPILLARY: 186 mg/dL — AB (ref 65–99)
Glucose-Capillary: 163 mg/dL — ABNORMAL HIGH (ref 65–99)
Glucose-Capillary: 189 mg/dL — ABNORMAL HIGH (ref 65–99)

## 2017-03-08 LAB — CBC
HCT: 34.6 % — ABNORMAL LOW (ref 36.0–46.0)
Hemoglobin: 11.6 g/dL — ABNORMAL LOW (ref 12.0–15.0)
MCH: 31.5 pg (ref 26.0–34.0)
MCHC: 33.5 g/dL (ref 30.0–36.0)
MCV: 94 fL (ref 78.0–100.0)
PLATELETS: 157 10*3/uL (ref 150–400)
RBC: 3.68 MIL/uL — AB (ref 3.87–5.11)
RDW: 15.4 % (ref 11.5–15.5)
WBC: 4.7 10*3/uL (ref 4.0–10.5)

## 2017-03-08 MED ORDER — DEXAMETHASONE 4 MG PO TABS
4.0000 mg | ORAL_TABLET | Freq: Two times a day (BID) | ORAL | Status: AC
  Administered 2017-03-08 – 2017-03-10 (×6): 4 mg via ORAL
  Filled 2017-03-08 (×6): qty 1

## 2017-03-08 MED ORDER — PANTOPRAZOLE SODIUM 40 MG PO TBEC
40.0000 mg | DELAYED_RELEASE_TABLET | Freq: Every day | ORAL | Status: DC
  Administered 2017-03-09 – 2017-03-22 (×13): 40 mg via ORAL
  Filled 2017-03-08 (×13): qty 1

## 2017-03-08 MED ORDER — AMLODIPINE BESYLATE 10 MG PO TABS
10.0000 mg | ORAL_TABLET | Freq: Every day | ORAL | Status: DC
  Administered 2017-03-08 – 2017-04-14 (×37): 10 mg via ORAL
  Filled 2017-03-08 (×38): qty 1

## 2017-03-08 MED ORDER — CARVEDILOL 12.5 MG PO TABS
12.5000 mg | ORAL_TABLET | Freq: Two times a day (BID) | ORAL | Status: DC
  Administered 2017-03-08 – 2017-03-20 (×22): 12.5 mg via ORAL
  Filled 2017-03-08 (×22): qty 1

## 2017-03-08 MED ORDER — IOPAMIDOL (ISOVUE-300) INJECTION 61%
INTRAVENOUS | Status: AC
  Administered 2017-03-08: 100 mL via INTRAVENOUS
  Filled 2017-03-08: qty 100

## 2017-03-08 MED ORDER — HYDRALAZINE HCL 20 MG/ML IJ SOLN
10.0000 mg | Freq: Four times a day (QID) | INTRAMUSCULAR | Status: DC | PRN
  Administered 2017-03-08 – 2017-03-27 (×6): 10 mg via INTRAVENOUS
  Filled 2017-03-08 (×6): qty 1

## 2017-03-08 MED ORDER — SODIUM CHLORIDE 0.9 % IV BOLUS (SEPSIS)
1000.0000 mL | Freq: Once | INTRAVENOUS | Status: AC
  Administered 2017-03-08: 1000 mL via INTRAVENOUS

## 2017-03-08 MED ORDER — OXYCODONE HCL 5 MG PO TABS
5.0000 mg | ORAL_TABLET | ORAL | Status: DC | PRN
  Administered 2017-03-14 – 2017-03-30 (×5): 5 mg via ORAL
  Filled 2017-03-08 (×5): qty 1

## 2017-03-08 MED ORDER — CLONIDINE HCL 0.1 MG PO TABS
0.1000 mg | ORAL_TABLET | Freq: Once | ORAL | Status: AC
  Administered 2017-03-08: 0.1 mg via ORAL
  Filled 2017-03-08: qty 1

## 2017-03-08 MED ORDER — IOPAMIDOL (ISOVUE-300) INJECTION 61%
INTRAVENOUS | Status: AC
  Administered 2017-03-08: 30 mL via ORAL
  Filled 2017-03-08: qty 30

## 2017-03-08 MED ORDER — LABETALOL HCL 5 MG/ML IV SOLN
10.0000 mg | INTRAVENOUS | Status: DC | PRN
  Administered 2017-03-09 – 2017-03-16 (×4): 10 mg via INTRAVENOUS
  Filled 2017-03-08 (×8): qty 4

## 2017-03-08 MED ORDER — LABETALOL HCL 5 MG/ML IV SOLN
20.0000 mg | Freq: Once | INTRAVENOUS | Status: AC
  Administered 2017-03-08: 20 mg via INTRAVENOUS
  Filled 2017-03-08: qty 4

## 2017-03-08 NOTE — Progress Notes (Signed)
03/08/17  1416  Notified Calvin, RN that his patient BP was 245/120. Paged Dr Denton Brick to notify of BP 245/120. There was no medicines for BP ordered for patient.

## 2017-03-08 NOTE — Progress Notes (Signed)
Initial Nutrition Assessment  DOCUMENTATION CODES:   Obesity unspecified  INTERVENTION:   Ensure Enlive po BID, each supplement provides 350 kcal and 20 grams of protein  NUTRITION DIAGNOSIS:   Increased nutrient needs related to chronic illness, cancer and cancer related treatments as evidenced by estimated needs.  GOAL:   Patient will meet greater than or equal to 90% of their needs  MONITOR:   PO intake, Labs, Supplement acceptance, Weight trends, I & O's  REASON FOR ASSESSMENT:   Malnutrition Screening Tool   ASSESSMENT:   Pt with PMH of metastatic breast cancer, gastritis, and DM presents with weakness s/p syncope    Pt reports a decreased appetite over the past 2 months. Pt reports she was experiencing N/V and diarrhea PTA however reports no nutrition impact symptoms currently. Pt reports an increase in appetite and intake since admission, consuming 100% of meals at this time.   Pt reports a UBW of 220 lbs, states she lost 20 lbs but has regained it back. Weight recordings reveal pt's weight has increased in the past month.   Per chart, MRI shows 6.4 cm R cerebellar mass Pt amenable to continued nutrition supplementation.  RD promoted small, frequent meals/snacks consisting of balanced carbohydrate and protein  Labs reviewed; CBG 119-286 Medications reviewed; sliding scale insulin  NUTRITION - FOCUSED PHYSICAL EXAM:    Most Recent Value  Orbital Region  No depletion  Upper Arm Region  No depletion  Thoracic and Lumbar Region  No depletion  Buccal Region  No depletion  Temple Region  Mild depletion  Clavicle Bone Region  No depletion  Clavicle and Acromion Bone Region  No depletion  Scapular Bone Region  No depletion  Dorsal Hand  No depletion  Patellar Region  No depletion  Anterior Thigh Region  No depletion  Posterior Calf Region  No depletion  Edema (RD Assessment)  None      Diet Order:  Diet heart healthy/carb modified Room service appropriate?  Yes; Fluid consistency: Thin  EDUCATION NEEDS:   Education needs have been addressed   Skin:  Skin Assessment: Reviewed RN Assessment  Last BM:  03/05/17  Height:   Ht Readings from Last 1 Encounters:  03/07/17 5\' 7"  (1.702 m)   Weight:   Wt Readings from Last 1 Encounters:  03/08/17 221 lb 12.5 oz (100.6 kg)   Ideal Body Weight:  61.4 kg  BMI:  Body mass index is 34.74 kg/m.  Estimated Nutritional Needs:   Kcal:  2020-2263  Protein:  110-120 grams  Fluid:  >/= 2 L/d  Parks Ranger, MS, RDN, LDN 03/08/2017 2:32 PM

## 2017-03-08 NOTE — Progress Notes (Signed)
  Echocardiogram 2D Echocardiogram has been performed.  Jannett Celestine 03/08/2017, 9:26 AM

## 2017-03-08 NOTE — Evaluation (Signed)
Occupational Therapy Evaluation Patient Details Name: Denise Morton MRN: [id number] DOB: 06/21/1950 Today's Date: 03/08/2017    History of Present Illness  Denise Morton is a 67 y.o. female  With pmhx of BRCA, DM, gastritis presents with weakness.  Patient states that for the past few months she has had decreased appetite and weakness.  Pt had hypotensive episode at MD office, was sent to hospital.  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   Clinical Impression   Pt admitted with decreased appetite and weakness. Pt currently with functional limitations due to the deficits listed below (see OT Problem List).  Pt will benefit from skilled OT to increase their safety and independence with ADL and functional mobility for ADL to facilitate discharge to venue listed below.      Follow Up Recommendations  SNF;Home health OT;Supervision/Assistance - 24 hour    Equipment Recommendations  3 in 1 bedside commode    Recommendations for Other Services       Precautions / Restrictions Precautions Precautions: Fall Restrictions Weight Bearing Restrictions: No      Mobility Bed Mobility Overal bed mobility: Needs Assistance Bed Mobility: Supine to Sit     Supine to sit: Supervision     General bed mobility comments: pt  in chair  Transfers Overall transfer level: Needs assistance Equipment used: Rolling walker (2 wheeled) Transfers: Sit to/from [facility] Sit to Stand: Mod assist Stand pivot transfers: Mod assist       General transfer comment: MOD A to power up and needed cues for proper hand placement.  She needed to stand for several minutes before beginning ambulation.    Balance Overall balance assessment: Needs assistance   Sitting balance-Leahy Scale: Fair       Standing balance-Leahy Scale: Poor Standing balance comment: requires RW support                           ADL either performed or assessed with clinical judgement   ADL Overall  ADL's : Needs assistance/impaired Eating/Feeding: Set up;Sitting   Grooming: Set up;Sitting   Upper Body Bathing: Minimal assistance;Sitting   Lower Body Bathing: Moderate assistance;Sit to/from stand;Cueing for safety;Cueing for sequencing   Upper Body Dressing : Minimal assistance;Sitting   Lower Body Dressing: Moderate assistance;Sit to/from stand;Cueing for safety;Cueing for sequencing                       Vision Baseline Vision/History: Wears glasses              Pertinent Vitals/Pain Pain Assessment: No/denies pain     Hand Dominance Right   Extremity/Trunk Assessment Upper Extremity Assessment Upper Extremity Assessment: Generalized weakness   Lower Extremity Assessment Lower Extremity Assessment: Generalized weakness       Communication Communication Communication: Expressive difficulties(some word finding difficulty)   Cognition Arousal/Alertness: Awake/alert Behavior During Therapy: WFL for tasks assessed/performed Overall Cognitive Status: Within Functional Limits for tasks assessed                                 General Comments: Pt was able to state why she was in the hospital and the findings of the MRI and that she and son would be meeting with the MD later today to discuss what her options are.  At times, difficulty with word finding, but minimal.   General Comments  Home Living Family/patient expects to be discharged to:: Private residence Living Arrangements: Alone Available Help at Discharge: Family;Available PRN/intermittently Type of Home: House Home Access: Stairs to enter [employer] of Steps: 1   Home Layout: One level     Bathroom Shower/Tub: Occupational psychologist: Handicapped height     Home Equipment: [place] - single point          Prior Functioning/Environment Level of Independence: Independent                 OT Problem List: Decreased  strength;Decreased activity tolerance      OT Treatment/Interventions: Self-care/ADL training;Patient/family education;DME and/or AE instruction    OT Goals(Current goals can be found in the care plan section) Acute Rehab OT Goals Patient Stated Goal: to figure out what her options are for treatment OT Goal Formulation: With patient Time For Goal Achievement: 03/15/17 Potential to Achieve Goals: Good ADL Goals Pt Will Perform Grooming: with modified independence;standing Pt Will Perform Lower Body Dressing: with modified independence;sit to/from stand Pt Will Transfer to Toilet: with modified independence;ambulating;regular height toilet Pt Will Perform Toileting - Clothing Manipulation and hygiene: with modified independence;sit to/from stand  OT Frequency: Min 2X/week              AM-PAC PT "6 Clicks" Daily Activity     Outcome Measure Help from another person eating meals?: None Help from another person taking care of personal grooming?: A Little Help from another person toileting, which includes using toliet, bedpan, or urinal?: A Lot Help from another person bathing (including washing, rinsing, drying)?: A Lot Help from another person to put on and taking off regular upper body clothing?: A Little Help from another person to put on and taking off regular lower body clothing?: A Lot 6 Click Score: 16   End of Session Equipment Utilized During Treatment: Rolling walker Nurse Communication: Mobility status  Activity Tolerance: Patient tolerated treatment well Patient left: in chair  OT Visit Diagnosis: Unsteadiness on feet (R26.81);Muscle weakness (generalized) (M62.81)                Time: 1027-2536 OT Time Calculation (min): 14 min Charges:  OT General Charges $OT Visit: 1 Visit OT Evaluation $OT Eval Moderate Complexity: 1 Mod G-Codes:     Kari Baars, [place]  Payton Mccallum D 03/08/2017, 1:52 PM

## 2017-03-08 NOTE — Evaluation (Signed)
Physical Therapy Evaluation Patient Details Name: Denise Morton MRN: [id number] DOB: Nov 07, 1950 Today's Date: 03/08/2017   History of Present Illness   Denise Morton is a 67 y.o. female  With pmhx of BRCA, DM, gastritis presents with weakness.  Patient states that for the past few months she has had decreased appetite and weakness.  Pt had hypotensive episode at MD office, was sent to hospital.  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  Clinical Impression  Pt admitted with above diagnosis. Pt currently with functional limitations due to the deficits listed below (see PT Problem List). Pt will benefit from skilled PT to increase their independence and safety with mobility to allow discharge to the venue listed below.  Pt requiring MOD A with sit to stand and reliant on RW for gait.  She will need 24 hour S at time of discharge and recommend SNF at this time.  Pt and son are meeting with MD this afternoon to discuss what her treatment options are at this time.     Follow Up Recommendations SNF;Supervision/Assistance - 24 hour    Equipment Recommendations  Rolling walker with 5" wheels    Recommendations for Other Services       Precautions / Restrictions Precautions Precautions: Fall Restrictions Weight Bearing Restrictions: No      Mobility  Bed Mobility Overal bed mobility: Needs Assistance Bed Mobility: Supine to Sit     Supine to sit: Supervision        Transfers Overall transfer level: Needs assistance Equipment used: Rolling walker (2 wheeled) Transfers: Sit to/from Stand Sit to Stand: Mod assist         General transfer comment: MOD A to power up and needed cues for proper hand placement.  She needed to stand for several minutes before beginning ambulation.  Ambulation/Gait Ambulation/Gait assistance: Min assist Ambulation Distance (Feet): 30 Feet Assistive device: Rolling walker (2 wheeled) Gait Pattern/deviations: Decreased step length - right;Decreased step  length - left     General Gait Details: Pt felt fatigued from 30' ambulation and was reliant on RW.  She began to rush near end of gait, but slowed down with cues for safety.  Stairs            Wheelchair Mobility    Modified Rankin (Stroke Patients Only)       Balance Overall balance assessment: Needs assistance   Sitting balance-Leahy Scale: Fair       Standing balance-Leahy Scale: Poor Standing balance comment: requires RW support                             Pertinent Vitals/Pain Pain Assessment: No/denies pain    Home Living Family/patient expects to be discharged to:: Private residence Living Arrangements: Alone Available Help at Discharge: Family;Available PRN/intermittently Type of Home: House Home Access: Stairs to enter   [employer] of Steps: 1 Home Layout: One level Home Equipment: Cane - single point      Prior Function Level of Independence: Independent               Hand Dominance   Dominant Hand: Right    Extremity/Trunk Assessment   Upper Extremity Assessment Upper Extremity Assessment: Generalized weakness    Lower Extremity Assessment Lower Extremity Assessment: Generalized weakness       Communication   Communication: Expressive difficulties(some word finding difficulty)  Cognition Arousal/Alertness: Awake/alert Behavior During Therapy: WFL for tasks assessed/performed Overall Cognitive Status: Within Functional  Limits for tasks assessed                                 General Comments: Pt was able to state why she was in the hospital and the findings of the MRI and that she and son would be meeting with the MD later today to discuss what her options are.  At times, difficulty with word finding, but minimal.      General Comments      Exercises     Assessment/Plan    PT Assessment Patient needs continued PT services  PT Problem List Decreased strength;Decreased activity  tolerance;Decreased balance;Decreased mobility;Decreased coordination;Decreased safety awareness       PT Treatment Interventions DME instruction;Gait training;Functional mobility training;Neuromuscular re-education;Balance training;Therapeutic exercise;Therapeutic activities;Patient/family education    PT Goals (Current goals can be found in the Care Plan section)  Acute Rehab PT Goals Patient Stated Goal: to figure out what her options are for treatment PT Goal Formulation: With patient Time For Goal Achievement: 03/22/17 Potential to Achieve Goals: Fair    Frequency Min 3X/week   Barriers to discharge Decreased caregiver support      Co-evaluation               AM-PAC PT "6 Clicks" Daily Activity  Outcome Measure Difficulty turning over in bed (including adjusting bedclothes, sheets and blankets)?: A Little Difficulty moving from lying on back to sitting on the side of the bed? : A Little Difficulty sitting down on and standing up from a chair with arms (e.g., wheelchair, bedside commode, etc,.)?: Unable Help needed moving to and from a bed to chair (including a wheelchair)?: A Lot Help needed walking in hospital room?: A Little Help needed climbing 3-5 steps with a railing? : A Lot 6 Click Score: 14    End of Session Equipment Utilized During Treatment: Gait belt Activity Tolerance: Patient limited by fatigue Patient left: in chair;with call bell/phone within reach;with chair alarm set Nurse Communication: Mobility status PT Visit Diagnosis: Unsteadiness on feet (R26.81);Muscle weakness (generalized) (M62.81);Difficulty in walking, not elsewhere classified (R26.2)    Time: 9622-2979 PT Time Calculation (min) (ACUTE ONLY): 30 min   Charges:   PT Evaluation $PT Eval Moderate Complexity: 1 Mod PT Treatments $Gait Training: 8-22 mins   PT G Codes:        Humphrey Guerreiro L. Tamala Julian, [place] Pager [phone] 03/08/2017   Galen Manila 03/08/2017, 12:38 PM

## 2017-03-08 NOTE — Progress Notes (Signed)
CRITICAL VALUE ALERT  Critical Value:  Lactic acid 3.5  Date & Time Notied:03/07/17 2221  Provider Notified: Tylene Fantasia, NP.  Orders Received/Actions taken: New orders placed by Provider. Tasks carried out as ordered.

## 2017-03-08 NOTE — Progress Notes (Signed)
BP = 245/120 Notified MD [place] Time: 7544

## 2017-03-08 NOTE — Consult Note (Signed)
Radiation Oncology         [phone] ________________________________  Name: Denise Morton        MRN: [id number]  Date of Service: 03/08/17 DOB: July 15, 1950  [id number], Denise Moll, MD  No ref. provider found     REFERRING PHYSICIAN: No ref. provider found   DIAGNOSIS: The primary encounter diagnosis was Slurred speech. Diagnoses of Vasogenic edema ([place]) and Near syncope were also pertinent to this visit.   HISTORY OF PRESENT ILLNESS: Denise Morton is a 67 y.o. female seen at the request of Dr. Lindi Adie for a history of metastatic HER2 breast cancer. The patient presented in 2016 with metastatic breast cancer to the bones, liver, and has been on herceptin for treatment. She presented yesterday for slurred speech and progressive headaches with nausea and vomiting that had been going on for about 2 months. She had GI evaluation as a well that revealed gastritis. She had an MRI ordered when she was triaged to the ED which revealed a 6.4 cm mass in the superior right cerebellum and surrounding edema is noted and the tumor compresses against the fourth ventricle and a 2 mm focus of enhancement in the right occipital lobe. She's on Dexamethasone 4 mg q 12 hours. We're asked to see her to discuss options of treatment.   PREVIOUS RADIATION THERAPY: No   PAST MEDICAL HISTORY:  Past Medical History:  Diagnosis Date  . CRVO (central retinal vein occlusion)   . Diabetes mellitus without complication ([place])   . Metastatic cancer ([place]) dx'd 07/2014   liver, lung and bone  . right breast ca dx'd 07/2014       PAST SURGICAL HISTORY: Past Surgical History:  Procedure Laterality Date  . TONSILLECTOMY    . TUBAL LIGATION    . TUMOR REMOVAL     benign- 2 on leg, 1 in finger     FAMILY HISTORY:  Family History  Problem Relation Age of Onset  . Heart failure Father      SOCIAL HISTORY:  reports that she has quit smoking. She has a 15.00 pack-year smoking history. she has never used smokeless  tobacco. She reports that she does not drink alcohol or use drugs.   ALLERGIES: Penicillin g and Sulfa antibiotics   MEDICATIONS:  Current Facility-Administered Medications  Medication Dose Route Frequency Provider Last Rate Last Dose  . acetaminophen (TYLENOL) tablet 650 mg  650 mg Oral Q6H PRN Elwin Mocha, MD       Or  . acetaminophen (TYLENOL) suppository 650 mg  650 mg Rectal Q6H PRN Elwin Mocha, MD      . amLODipine (NORVASC) tablet 10 mg  10 mg Oral Daily Denton Brick, Courage, MD   10 mg at 03/08/17 1439  . carvedilol (COREG) tablet 12.5 mg  12.5 mg Oral BID WC Emokpae, Courage, MD   12.5 mg at 03/08/17 1711  . chlorhexidine (PERIDEX) 0.12 % solution 15 mL  15 mL Mouth Rinse BID Elwin Mocha, MD   15 mL at 03/08/17 2228  . dexamethasone (DECADRON) tablet 4 mg  4 mg Oral Q12H Emokpae, Courage, MD   4 mg at 03/08/17 2228  . enoxaparin (LOVENOX) injection 40 mg  40 mg Subcutaneous Q24H Elwin Mocha, MD   40 mg at 03/08/17 2228  . feeding supplement (ENSURE ENLIVE) (ENSURE ENLIVE) liquid 237 mL  237 mL Oral BID BM Elwin Mocha, MD   237 mL at 03/08/17 0959  . hydrALAZINE (APRESOLINE) injection 10 mg  10  mg Intravenous Q6H PRN Roxan Hockey, MD   10 mg at 03/08/17 1555  . insulin aspart (novoLOG) injection 0-15 Units  0-15 Units Subcutaneous TID WC Elwin Mocha, MD   3 Units at 03/08/17 1712  . labetalol (NORMODYNE,TRANDATE) injection 10 mg  10 mg Intravenous Q4H PRN Emokpae, Courage, MD      . LORazepam (ATIVAN) injection 1 mg  1 mg Intravenous Daily PRN Elwin Mocha, MD   1 mg at 03/07/17 1730  . MEDLINE mouth rinse  15 mL Mouth Rinse q12n4p Elwin Mocha, MD   15 mL at 03/08/17 1557  . ondansetron (ZOFRAN) tablet 4 mg  4 mg Oral Q6H PRN Elwin Mocha, MD       Or  . ondansetron [facility]) injection 4 mg  4 mg Intravenous Q6H PRN Elwin Mocha, MD      . oxyCODONE (Oxy IR/ROXICODONE) immediate release tablet 5 mg  5 mg Oral Q4H PRN Emokpae, Courage, MD       . prochlorperazine (COMPAZINE) tablet 10 mg  10 mg Oral Q6H PRN Elwin Mocha, MD       Facility-Administered Medications Ordered in Other Encounters  Medication Dose Route Frequency Provider Last Rate Last Dose  . sodium chloride 0.9 % injection 10 mL  10 mL Intracatheter PRN Nicholas Lose, MD   10 mL at 02/18/15 1111     REVIEW OF SYSTEMS: On review of systems, the patient reports that she is doing well overall now that she's had dexamethasone. She denies any chest pain, shortness of breath, cough, fevers, chills, night sweats, unintended weight changes. She reports her nausea is somewhat improved as well as her headaches. She denies any bowel or bladder disturbances, and denies abdominal pain. She denies any new musculoskeletal or joint aches or pains. A complete review of systems is obtained and is otherwise negative.     PHYSICAL EXAM:  Wt Readings from Last 3 Encounters:  03/08/17 221 lb 12.5 oz (100.6 kg)  02/08/17 198 lb 8 oz (90 kg)  01/11/17 227 lb 1.6 oz (103 kg)   Temp Readings from Last 3 Encounters:  03/08/17 97.6 F (36.4 C) (Oral)  02/08/17 (!) 95.7 F (35.4 C) (Oral)  01/11/17 97.7 F (36.5 C) (Oral)   BP Readings from Last 3 Encounters:  03/08/17 (!) 158/76  02/08/17 109/86  01/11/17 134/79   Pulse Readings from Last 3 Encounters:  03/08/17 68  02/08/17 69  01/11/17 62   Pain Assessment Pain Score: 0-No pain/10  In general this is a well appearing Caucasian female in no acute distress. She is alert and oriented x4 and appropriate throughout the examination. HEENT reveals that the patient is normocephalic, atraumatic. EOMs are intact. She is grossly intact neurologically. Skin is intact without any evidence of gross lesions. Cardiopulmonary assessment is negative for acute distress and she exhibits normal effort.    ECOG = 2  0 - Asymptomatic (Fully active, able to carry on all predisease activities without restriction)  1 - Symptomatic but  completely ambulatory (Restricted in physically strenuous activity but ambulatory and able to carry out work of a light or sedentary nature. For example, light housework, office work)  2 - Symptomatic, <50% in bed during the day (Ambulatory and capable of all self care but unable to carry out any work activities. Up and about more than 50% of waking hours)  3 - Symptomatic, >50% in bed, but not bedbound (Capable of only limited self-care, confined to bed  or chair 50% or more of waking hours)  4 - Bedbound (Completely disabled. Cannot carry on any self-care. Totally confined to bed or chair)  5 - Death   Eustace Pen MM, Creech RH, Tormey DC, et al. [phone]). "Toxicity and response criteria of the [facility]". [place] Oncol. 5 (6): 649-55    LABORATORY DATA:  Lab Results  Component Value Date   WBC 4.7 03/08/2017   HGB 11.6 (L) 03/08/2017   HCT 34.6 (L) 03/08/2017   MCV 94.0 03/08/2017   PLT 157 03/08/2017   Lab Results  Component Value Date   NA 141 03/08/2017   K 3.8 03/08/2017   CL 114 (H) 03/08/2017   CO2 19 (L) 03/08/2017   Lab Results  Component Value Date   ALT 19 01/11/2017   AST 14 01/11/2017   ALKPHOS 80 01/11/2017   BILITOT 0.25 01/11/2017      RADIOGRAPHY: Ct Head Wo Contrast  Addendum Date: 03/07/2017   ADDENDUM REPORT: 03/07/2017 15:42 ADDENDUM: These results were called by telephone at the time of interpretation on 03/07/2017 at 3:42 pm to Dr. Deno Etienne , who verbally acknowledged these results. Electronically Signed   By: Abelardo Diesel M.D.   On: 03/07/2017 15:42   Result Date: 03/07/2017 CLINICAL DATA:  Slurred speech today at doctor's office. History of breast cancer. EXAM: CT HEAD WITHOUT CONTRAST TECHNIQUE: Contiguous axial images were obtained from the base of the skull through the vertex without intravenous contrast. COMPARISON:  None. FINDINGS: Brain: There are multiple calcifications of the right cerebellum with low-density and  changes of edema involving the bilateral cerebellum. There is bilateral periventricular white matter small vessel ischemic change. There is no midline shift or hydrocephalus. No acute hemorrhage is identified. Vascular: No hyperdense vessel or unexpected calcification. Skull: Normal. Negative for fracture or focal lesion. Sinuses/Orbits: No acute finding. Other: None. IMPRESSION: Multiple calcifications of right cerebellum with low-density in changes of edema involving the bilateral cerebellum. Metastatic disease is suspected. Further evaluation with MRI of the brain is recommended. Electronically Signed: By: Abelardo Diesel M.D. On: 03/07/2017 15:32   Mr Brain W And Wo Contrast  Result Date: 03/07/2017 CLINICAL DATA:  67 y/o F; right cerebellar lesion suspected metastatic disease, initial workup. History of metastatic breast cancer. EXAM: MRI HEAD WITHOUT AND WITH CONTRAST TECHNIQUE: Multiplanar, multiecho pulse sequences of the brain and surrounding structures were obtained without and with intravenous contrast. CONTRAST:  39m MULTIHANCE GADOBENATE DIMEGLUMINE 529 MG/ML IV SOLN COMPARISON:  03/07/2017 CT head and 12/09/2014 PET-CT FINDINGS: Brain: Heterogeneous irregular marginated mass within the right superior cerebellum measuring 4.8 x 6.4 x 3.2 cm (AP x ML x CC series 11, image 17 and series 13, image 7). Mass demonstrates speckled T2 hypointense signal with susceptibility blooming corresponding to mineralization on CT. There are numerous tortuous blood vessel surrounding the mass. Surrounding edema extends into the right pons and left cerebellar hemisphere. Mass effect partially effaces the fourth ventricle and right aspect of quadrigeminal plate cistern. Mild brain parenchymal volume loss. Ventriculomegaly is borderline for the degree of volume loss. There is a background of moderate chronic microvascular ischemic changes of the brain and multiple foci of susceptibility hypointensity compatible with  microhemorrhage. 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 2 mm focus of enhancement in right occipital lobe (series 11, image 30). Vascular: Patent central flow voids. Skull and upper cervical spine: Normal marrow signal. Sinuses/Orbits: Negative. Other: None. IMPRESSION: 1. Heterogeneous mass measuring  up to 6.4 cm in the right superior cerebellum with surrounding abnormal vascularity. No appreciable lesion was present on the 2016 PET-CT noncontrast portion. 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 Associated edema and mass effect partially effaces fourth ventricle. 2. Lateral and third ventriculomegaly is borderline for degree of volume loss and early hydrocephalus is possible. 3. Additional 3 mm enhancing focus in right occipital lobe may represent metastasis. Electronically Signed   By: Kristine Garbe M.D.   On: 03/07/2017 18:57   US Abdomen Complete  Result Date: 02/15/2017 CLINICAL DATA:  Nausea. EXAM: ABDOMEN ULTRASOUND COMPLETE COMPARISON:  CT scan of October 18, 2016. FINDINGS: Gallbladder: No gallstones or wall thickening visualized. No sonographic Murphy sign noted by sonographer. Common bile duct: Diameter: 3.8 mm which is within normal limits. Liver: No focal lesion identified. Increased echogenicity of hepatic parenchyma is noted suggesting fatty infiltration. Portal vein is patent on color Doppler imaging with normal direction of blood flow towards the liver. IVC: No abnormality visualized. Pancreas: Visualized portion unremarkable. Spleen: Size and appearance within normal limits. Right Kidney: Length: 10.9 cm. 2.6 cm cyst is noted in midpole. 8 mm nonobstructive calculus is noted. Echogenicity within normal limits. No mass or hydronephrosis visualized. Left Kidney: Length: 10.8 cm. 6 mm calculus is noted. Echogenicity within normal limits. No mass or hydronephrosis visualized. Abdominal aorta: No aneurysm visualized. Other  findings: None. IMPRESSION: Bilateral nonobstructive nephrolithiasis. Increased echogenicity of hepatic parenchyma is noted suggesting fatty infiltration. Electronically Signed   By: Marijo Conception, M.D.   On: 02/15/2017 15:22       IMPRESSION/PLAN: 1. 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 I reviewed the findings from her MRI and her prior cancer history. Despite her history of presenting with stage IV disease, she's done quite well and currently is appearing much better clinically than radiographic images would be concerning for. We recommended proceeding with dexamethasone as outlined already and adding PPI for GI prophylaxis. We discussed the risks, benefits, short, and long term effects of radiotherapy either with posterior fossa versus preop SRS versus postop SRS and the patient is interested in proceeding with the appropriate approach for therapy at the appropriate time. We will follow up with Dr. Windy Carina recommendations regarding her case.      Carola Rhine, PAC

## 2017-03-08 NOTE — Consult Note (Signed)
Reason for Consult: Right cerebellar met Referring Physician: Radiation oncology Dr. [name] is an 67 y.o. female.  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 Workup with CT and subsequent MRI scan showed a large right cerebellar met as well as a smaller 2 mm right occipital mass. Patient was admitted was placed on IV Decadron she does fill (she did yesterday apparently she was in her primary care's office yesterday and noted be very hypotensive and unresponsive and that prompted the visit to the ER.  Past Medical History:  Diagnosis Date  . CRVO (central retinal vein occlusion)   . Diabetes mellitus without complication ([place])   . Metastatic cancer ([place]) dx'd 07/2014   liver, lung and bone  . right breast ca dx'd 07/2014    Past Surgical History:  Procedure Laterality Date  . TONSILLECTOMY    . TUBAL LIGATION    . TUMOR REMOVAL     benign- 2 on leg, 1 in finger    Family History  Problem Relation Age of Onset  . Heart failure Father     Social History:  reports that she has quit smoking. She has a 15.00 pack-year smoking history. she has never used smokeless tobacco. She reports that she does not drink alcohol or use drugs.  Allergies:  Allergies  Allergen Reactions  . Penicillin G Rash    Has patient had a PCN reaction causing immediate rash, facial/tongue/throat swelling, SOB or lightheadedness with hypotension: Unknown Has patient had a PCN reaction causing severe rash involving mucus membranes or skin necrosis: Unknown Has patient had a PCN reaction that required hospitalization: Unknown Has patient had a PCN reaction occurring within the last 10 years: Unknown If all of the above answers are "NO", then may proceed with Cephalosporin use.   . Sulfa Antibiotics Rash     Medications: I have reviewed the patient's current medications.  Results for orders placed or performed during the hospital encounter of 03/07/17 (from the past 48 hour(s))  CBG monitoring, ED     Status: Abnormal   Collection Time: 03/07/17  1:29 PM  Result Value Ref Range   Glucose-Capillary 190 (H) 65 - 99 mg/dL  CBC with Differential     Status: None   Collection Time: 03/07/17  1:36 PM  Result Value Ref Range   WBC 8.5 4.0 - 10.5 K/uL   RBC 4.67 3.87 - 5.11 MIL/uL   Hemoglobin 14.5 12.0 - 15.0 g/dL   HCT 44.2 36.0 - 46.0 %   MCV 94.6 78.0 - 100.0 fL   MCH 31.0 26.0 - 34.0 pg   MCHC 32.8 30.0 - 36.0 g/dL   RDW 15.3 11.5 - 15.5 %   Platelets 206 150 - 400 K/uL   Neutrophils Relative % 77 %   Neutro Abs 6.6 1.7 - 7.7 K/uL   Lymphocytes Relative 18 %   Lymphs Abs 1.5 0.7 - 4.0 K/uL   Monocytes Relative 5 %   Monocytes Absolute 0.4 0.1 - 1.0 K/uL   Eosinophils Relative 0 %   Eosinophils Absolute 0.0 0.0 - 0.7 K/uL   Basophils Relative 0 %   Basophils Absolute 0.0 0.0 - 0.1 K/uL    Comment: Performed at [facility], [place] [address] [place], [place] [address]  Basic metabolic panel     Status:  Abnormal   Collection Time: 03/07/17  1:36 PM  Result Value Ref Range   Sodium 142 135 - 145 mmol/L   Potassium 3.9 3.5 - 5.1 mmol/L   Chloride 104 101 - 111 mmol/L   CO2 25 22 - 32 mmol/L   Glucose, Bld 191 (H) 65 - 99 mg/dL   BUN 9 6 - 20 mg/dL   Creatinine, Ser 1.12 (H) 0.44 - 1.00 mg/dL   Calcium 9.5 8.9 - 10.3 mg/dL   GFR calc non Af Amer 50 (L) >60 mL/min   GFR calc Af Amer 58 (L) >60 mL/min    Comment: (NOTE) The eGFR has been calculated using the CKD EPI equation. This calculation has not been validated in all clinical situations. eGFR's persistently <60 mL/min signify possible Chronic Kidney Disease.    Anion gap 13 5 - 15    Comment: Performed at [facility], [place] [address], [place], [place] [address]  I-Stat CG4 Lactic  Acid, ED     Status: Abnormal   Collection Time: 03/07/17  1:49 PM  Result Value Ref Range   Lactic Acid, Venous 3.64 (HH) 0.5 - 1.9 mmol/L   Comment NOTIFIED PHYSICIAN   Blood gas, venous     Status: Abnormal   Collection Time: 03/07/17  2:53 PM  Result Value Ref Range   pH, Ven 7.388 7.250 - 7.430   pCO2, Ven 42.8 (L) 44.0 - 60.0 mmHg   pO2, Ven below [place]bale range 32.0 - 45.0 mmHg    Comment: rbv dan floyd md by angie dunlap rrt rcp on 03/07/17 at 1456   Bicarbonate 25.2 20.0 - 28.0 mmol/L   Acid-Base Excess 0.6 0.0 - 2.0 mmol/L   O2 Saturation 40.1 %   Patient temperature 98.6    Collection site VEIN    Drawn by DRAWN BY RN    Sample type VENOUS     Comment: Performed at [facility], [place] [address], [place], [place] [address]  .Cooxemetry Panel (carboxy, met, total hgb, O2 sat)     Status: None   Collection Time: 03/07/17  2:58 PM  Result Value Ref Range   Total hemoglobin 13.0 12.0 - 16.0 g/dL   O2 Saturation 42.0 %   Carboxyhemoglobin 0.7 0.5 - 1.5 %   Methemoglobin 1.0 0.0 - 1.5 %    Comment: Performed at [facility], [place] [address], [place], [place] [address]  I-Stat CG4 Lactic Acid, ED     Status: Abnormal   Collection Time: 03/07/17  4:21 PM  Result Value Ref Range   Lactic Acid, Venous 2.20 (HH) 0.5 - 1.9 mmol/L   Comment NOTIFIED PHYSICIAN   CBG monitoring, ED     Status: Abnormal   Collection Time: 03/07/17  5:03 PM  Result Value Ref Range   Glucose-Capillary 119 (H) 65 - 99 mg/dL  Lactic acid, plasma     Status: Abnormal   Collection Time: 03/07/17  6:35 PM  Result Value Ref Range   Lactic Acid, Venous 2.6 (HH) 0.5 - 1.9 mmol/L    Comment: CRITICAL RESULT CALLED TO, READ BACK BY AND VERIFIED WITH: Mellody Drown. RN _0  ON 02.19.19 BY COHEN,K Performed at [facility], [place] [address], [place], [place] [address]   Glucose, capillary     Status: Abnormal   Collection Time: 03/07/17  8:22 PM  Result  Value Ref Range   Glucose-Capillary 272 (H) 65 - 99 mg/dL  Glucose, capillary     Status:  Abnormal   Collection Time: 03/07/17  9:04 PM  Result Value Ref Range   Glucose-Capillary 286 (H) 65 - 99 mg/dL  Lactic acid, plasma     Status: Abnormal   Collection Time: 03/07/17  9:17 PM  Result Value Ref Range   Lactic Acid, Venous 3.5 (HH) 0.5 - 1.9 mmol/L    Comment: CRITICAL RESULT CALLED TO, READ BACK BY AND VERIFIED WITH: Mellody Drown RN 2.19.19 _0  ZANDO,C Performed at [facility], [place] [address] [place], [place] [address]   Lactic acid, plasma     Status: Abnormal   Collection Time: 03/08/17 12:12 AM  Result Value Ref Range   Lactic Acid, Venous 3.3 (HH) 0.5 - 1.9 mmol/L    Comment: CRITICAL RESULT CALLED TO, READ BACK BY AND VERIFIED WITH: HEAVENER,A RN 2.20.19 _1  ZANDOC Performed at [facility], [place] [address] [place], [place] [address]   Basic metabolic panel     Status: Abnormal   Collection Time: 03/08/17  3:47 AM  Result Value Ref Range   Sodium 141 135 - 145 mmol/L   Potassium 3.8 3.5 - 5.1 mmol/L   Chloride 114 (H) 101 - 111 mmol/L   CO2 19 (L) 22 - 32 mmol/L   Glucose, Bld 176 (H) 65 - 99 mg/dL   BUN 10 6 - 20 mg/dL   Creatinine, Ser 0.65 0.44 - 1.00 mg/dL   Calcium 7.8 (L) 8.9 - 10.3 mg/dL   GFR calc non Af Amer >60 >60 mL/min   GFR calc Af Amer >60 >60 mL/min    Comment: (NOTE) The eGFR has been calculated using the CKD EPI equation. This calculation has not been validated in all clinical situations. eGFR's persistently <60 mL/min signify possible Chronic Kidney Disease.    Anion gap 8 5 - 15    Comment: Performed at [facility], [place] [address] [place], [place] [address]  CBC     Status: Abnormal   Collection Time: 03/08/17  3:47 AM  Result Value Ref Range   WBC 4.7 4.0 - 10.5 K/uL   RBC 3.68 (L) 3.87 - 5.11 MIL/uL   Hemoglobin 11.6 (L) 12.0 - 15.0 g/dL   HCT 34.6 (L) 36.0 - 46.0 %   MCV  94.0 78.0 - 100.0 fL   MCH 31.5 26.0 - 34.0 pg   MCHC 33.5 30.0 - 36.0 g/dL   RDW 15.4 11.5 - 15.5 %   Platelets 157 150 - 400 K/uL    Comment: Performed at [facility] [place] [address], [place], [place] [address]  Lactic acid, plasma     Status: None   Collection Time: 03/08/17  3:47 AM  Result Value Ref Range   Lactic Acid, Venous 1.4 0.5 - 1.9 mmol/L    Comment: Performed at [facility], [place] [address] [place],  [address]  Glucose, capillary     Status: Abnormal   Collection Time: 03/08/17  7:34 AM  Result Value Ref Range   Glucose-Capillary 163 (H) 65 - 99 mg/dL  Glucose, capillary     Status: Abnormal   Collection Time: 03/08/17 11:38 AM  Result Value Ref Range   Glucose-Capillary 186 (H) 65 - 99 mg/dL  Glucose, capillary     Status: Abnormal   Collection Time: 03/08/17  4:42 PM  Result Value Ref Range   Glucose-Capillary 163 (H) 65 - 99 mg/dL    Ct Head Wo Contrast  Addendum Date: 03/07/2017   ADDENDUM REPORT: 03/07/2017  15:42 ADDENDUM: These results were called by telephone at the time of interpretation on 03/07/2017 at 3:42 pm to Dr. Deno Etienne , who verbally acknowledged these results. Electronically Signed   By: Abelardo Diesel M.D.   On: 03/07/2017 15:42   Result Date: 03/07/2017 CLINICAL DATA:  Slurred speech today at doctor's office. History of breast cancer. EXAM: CT HEAD WITHOUT CONTRAST TECHNIQUE: Contiguous axial images were obtained from the base of the skull through the vertex without intravenous contrast. COMPARISON:  None. FINDINGS: Brain: There are multiple calcifications of the right cerebellum with low-density and changes of edema involving the bilateral cerebellum. There is bilateral periventricular white matter small vessel ischemic change. There is no midline shift or hydrocephalus. No acute hemorrhage is identified. Vascular: No hyperdense vessel or unexpected calcification. Skull: Normal. Negative for fracture or  focal lesion. Sinuses/Orbits: No acute finding. Other: None. IMPRESSION: Multiple calcifications of right cerebellum with low-density in changes of edema involving the bilateral cerebellum. Metastatic disease is suspected. Further evaluation with MRI of the brain is recommended. Electronically Signed: By: Abelardo Diesel M.D. On: 03/07/2017 15:32   Mr Brain W And Wo Contrast  Result Date: 03/07/2017 CLINICAL DATA:  67 y/o F; right cerebellar lesion suspected metastatic disease, initial workup. History of metastatic breast cancer. EXAM: MRI HEAD WITHOUT AND WITH CONTRAST TECHNIQUE: Multiplanar, multiecho pulse sequences of the brain and surrounding structures were obtained without and with intravenous contrast. CONTRAST:  30m MULTIHANCE GADOBENATE DIMEGLUMINE 529 MG/ML IV SOLN COMPARISON:  03/07/2017 CT head and 12/09/2014 PET-CT FINDINGS: Brain: Heterogeneous irregular marginated mass within the right superior cerebellum measuring 4.8 x 6.4 x 3.2 cm (AP x ML x CC series 11, image 17 and series 13, image 7). Mass demonstrates speckled T2 hypointense signal with susceptibility blooming corresponding to mineralization on CT. There are numerous tortuous blood vessel surrounding the mass. Surrounding edema extends into the right pons and left cerebellar hemisphere. Mass effect partially effaces the fourth ventricle and right aspect of quadrigeminal plate cistern. Mild brain parenchymal volume loss. Ventriculomegaly is borderline for the degree of volume loss. There is a background of moderate chronic microvascular ischemic changes of the brain and multiple foci of susceptibility hypointensity compatible with microhemorrhage. 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 2 mm focus of enhancement in right occipital lobe (series 11, image 30). Vascular: Patent central flow voids. Skull and upper cervical spine: Normal marrow signal. Sinuses/Orbits: Negative. Other: None. IMPRESSION: 1.  Heterogeneous mass measuring up to 6.4 cm in the right superior cerebellum with surrounding abnormal vascularity. No appreciable lesion was present on the 2016 PET-CT noncontrast portion. 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 Associated edema and mass effect partially effaces fourth ventricle. 2. Lateral and third ventriculomegaly is borderline for degree of volume loss and early hydrocephalus is possible. 3. Additional 3 mm enhancing focus in right occipital lobe may represent metastasis. Electronically Signed   By: LKristine GarbeM.D.   On: 03/07/2017 18:57    Review of Systems  Neurological: Positive for dizziness, speech change, focal weakness and headaches.   Blood pressure (!) 184/83, pulse 97, temperature 97.6 F (36.4 C), temperature source Axillary, resp. rate 16, height '5\' 7"'$  (1.702 m), weight 100.6 kg (221 lb 12.5 oz), SpO2 95 %. Physical Exam  Constitutional: She is oriented to person, place, and time.  Neurological: She is alert and oriented to person, place, and time. She has normal strength. Coordination and gait abnormal. GCS eye subscore is 4.  GCS verbal subscore is 5. GCS motor subscore is 6.  Patient is awake and alert pupils equal extra movements are intact cranial nerves are otherwise intact. Strength is 5 out of 5 dysmetria right greater than left    Assessment/Plan: 67 year old female with a large 6 cm right cerebellar metastases. I do not think that this is a good lesion for peoperative stereotactic radiosurgery. Size and location would not tolerate any additional swelling. I recommended suboccipital craniectomy for resection of this mass and we'll look towards proceeding forward with that on Monday. I agree with continuing IV Decadron to continue full metastatic workup. I would give consideration to transferring to [place] to 4N progressive unit on Friday in preparation and observation prior to surgery on Monday.  Martha Ellerby  P 03/08/2017, 5:46 PM

## 2017-03-08 NOTE — Evaluation (Signed)
Speech Language Pathology Evaluation Patient Details Name: Denise Morton MRN: [id number] DOB: 25-Aug-1950 Today's Date: 03/08/2017 Time: 1445-1500 SLP Time Calculation (min) (ACUTE ONLY): 15 min  Problem List:  Patient Active Problem List   Diagnosis Date Noted  . Syncope 03/07/2017  . Chemotherapy-induced peripheral neuropathy ([place]) 04/22/2015  . Essential hypertension 04/03/2015  . Liver metastases ([place]) 03/11/2015  . Metastatic cancer ([place]) 01/29/2015  . Lung metastases ([place]) 10/20/2014  . Breast cancer of upper-outer quadrant of right female breast ([place]) 08/15/2014   Past Medical History:  Past Medical History:  Diagnosis Date  . CRVO (central retinal vein occlusion)   . Diabetes mellitus without complication ([place])   . Metastatic cancer ([place]) dx'd 07/2014   liver, lung and bone  . right breast ca dx'd 07/2014   Past Surgical History:  Past Surgical History:  Procedure Laterality Date  . TONSILLECTOMY    . TUBAL LIGATION    . TUMOR REMOVAL     benign- 2 on leg, 1 in finger   HPI:  Denise Morton is a 67 y.o. female with PMH of BRCA, DM, gastritis presents with weakness and past few months decreased appetite and weakness. Per chart pt had hypotensive episode at MD office, was sent to hospital.  MRI shows R cerebellar mass 6.4 cm   Assessment / Plan / Recommendation Clinical Impression  Pt presents with anomia at the conversational level with awareness and attempts to self correct using same initial phoneme and semantic descriptors. Cognition, comprehension assessed to be within normal limits. Remainder of assessment focused on strategies to facilitate word finding including use of descriptors, synonyms, antonyms, alphabet and visualization with examples from this therapist. Pt feels she does not need further ST at this time and educated her to notify PCP if deficits are unmanageable.        SLP Assessment  SLP Recommendation/Assessment: Patient does not need any further  Speech Lanaguage Pathology Services SLP Visit Diagnosis: Cognitive communication deficit (R41.841)    Follow Up Recommendations  None    Frequency and Duration           SLP Evaluation Cognition  Overall Cognitive Status: Within Functional Limits for tasks assessed Safety/Judgment: Appears intact       Comprehension  Auditory Comprehension Overall Auditory Comprehension: Appears within functional limits for tasks assessed Visual Recognition/Discrimination Discrimination: Not tested Reading Comprehension Reading Status: Not tested    Expression Expression Primary Mode of Expression: Verbal Verbal Expression Overall Verbal Expression: Impaired Initiation: No impairment Level of Generative/Spontaneous Verbalization: Conversation Repetition: No impairment Naming: No impairment Pragmatics: No impairment Written Expression Dominant Hand: Right Written Expression: Not tested   Oral / Motor  Oral Motor/Sensory Function Overall Oral Motor/Sensory Function: Within functional limits Motor Speech Overall Motor Speech: Appears within functional limits for tasks assessed Respiration: Within functional limits Phonation: Normal Resonance: Within functional limits Articulation: Within functional limitis Intelligibility: Intelligible Motor Planning: Witnin functional limits   GO                    Houston Siren 03/08/2017, 3:27 PM   Orbie Pyo Dejai Schubach M.Ed [employer] [phone]

## 2017-03-08 NOTE — Progress Notes (Signed)
HEMATOLOGY-ONCOLOGY PROGRESS NOTE  SUBJECTIVE: Patient is admitted with very large cerebellar lesion consistent with metastatic breast cancer.  She had slurring of speech gait ataxia and nausea and vomiting.  These symptoms have been ongoing for the past month.  She was initially referred to upper endoscopy and colonoscopy because she had so much nausea and vomiting.  While she was there she was acting to have slurred speech and that resulted in transfer to the emergency room.  She had a  MRI of the brain which showed a 6.2 cm sized cerebellar lesion. Since she was started on steroids her speech disturbance has resolved.    Breast cancer of upper-outer quadrant of right female breast ([place])   08/15/2014 Initial Diagnosis    Right Breast Inflammatory invasive ductal cancer: 17 cm by ultrasound along with 13 cm ulceration, node also positive T4N1 (stage 3C) ER 0%, PR 0%, Her-2 Pos      08/22/2014 PET scan    Metastatic disease. In addition to Right breast, hypermetabolic disease in Rt Supraclav LN, axilla and mediastinum, bulky disease in liver, Pulm Mets, Path fracture 9th rib      09/03/2014 - 11/26/2014 Chemotherapy    Herceptin Perjeta started 09/03/2014, Taxol weekly added 09/10/2014      10/17/2014 Imaging    CT scans: Decrease in right axillary lymph node, decreased skin thickening of the right breast, decreased in bilateral pulmonary nodules, decrease in liver metastases      12/09/2014 PET scan    Remarkable response to chemotherapy resolution of liver metastases, marked improvement in the tumor in the breast, marked improvement in right axillary lymph nodes, resolution of lung nodules, improvement in left sixth rib      12/17/2014 -  Chemotherapy    Herceptin and Perjeta maintenance every 3 weeks, switched to every 4 weeks April 2018, switched to Herceptin alone 10/19/2016 every 4 weeks       OBJECTIVE: REVIEW OF SYSTEMS:   Constitutional: Denies fevers, chills or abnormal weight  loss Eyes: Denies blurriness of vision Ears, nose, mouth, throat, and face: Denies mucositis or sore throat Respiratory: Denies cough, dyspnea or wheezes Cardiovascular: Denies palpitation, chest discomfort Gastrointestinal:  Denies nausea, heartburn or change in bowel habits Skin: Denies abnormal skin rashes Lymphatics: Denies new lymphadenopathy or easy bruising Neurological: Very mild slurring of speech still present Behavioral/Psych: Mood is stable, no new changes  Extremities: No lower extremity edema All other systems were reviewed with the patient and are negative.  I have reviewed the past medical history, past surgical history, social history and family history with the patient and they are unchanged from previous note.   PHYSICAL EXAMINATION: ECOG PERFORMANCE STATUS: 1 - Symptomatic but completely ambulatory  Vitals:   03/08/17 1520 03/08/17 1700  BP: (!) 243/106 (!) 184/83  Pulse: 97   Resp:    Temp:    SpO2: 96% 95%   Filed Weights   03/07/17 1330 03/08/17 0429  Weight: 198 lb (89.8 kg) 221 lb 12.5 oz (100.6 kg)    GENERAL:alert, no distress and comfortable SKIN: skin color, texture, turgor are normal, no rashes or significant lesions EYES: normal, Conjunctiva are pink and non-injected, sclera clear OROPHARYNX:no exudate, no erythema and lips, buccal mucosa, and tongue normal  NECK: supple, thyroid normal size, non-tender, without nodularity LYMPH:  no palpable lymphadenopathy in the cervical, axillary or inguinal LUNGS: clear to auscultation and percussion with normal breathing effort HEART: regular rate & rhythm and no murmurs and no lower extremity edema ABDOMEN:abdomen  soft, non-tender and normal bowel sounds Musculoskeletal:no cyanosis of digits and no clubbing  NEURO: alert & oriented x 3 slight slurring of speech, no focal motor/sensory deficits  LABORATORY DATA:  I have reviewed the data as listed CMP Latest Ref Rng & Units 03/08/2017 03/07/2017  01/11/2017  Glucose 65 - 99 mg/dL 176(H) 191(H) 131  BUN 6 - 20 mg/dL 10 9 27.6(H)  Creatinine 0.44 - 1.00 mg/dL 0.65 1.12(H) 1.1  Sodium 135 - 145 mmol/L 141 142 142  Potassium 3.5 - 5.1 mmol/L 3.8 3.9 3.7  Chloride 101 - 111 mmol/L 114(H) 104 -  CO2 22 - 32 mmol/L 19(L) 25 24  Calcium 8.9 - 10.3 mg/dL 7.8(L) 9.5 9.4  Total Protein 6.4 - 8.3 g/dL - - 6.4  Total Bilirubin 0.20 - 1.20 mg/dL - - 0.25  Alkaline Phos 40 - 150 U/L - - 80  AST 5 - 34 U/L - - 14  ALT 0 - 55 U/L - - 19    Lab Results  Component Value Date   WBC 4.7 03/08/2017   HGB 11.6 (L) 03/08/2017   HCT 34.6 (L) 03/08/2017   MCV 94.0 03/08/2017   PLT 157 03/08/2017   NEUTROABS 6.6 03/07/2017    ASSESSMENT AND PLAN: 1.  Metastatic breast cancer with cerebellar metastases: Radiation oncology and neurosurgery Dr. Saintclair Halsted were consulted.  Awaiting their recommendations. In the meantime continue with steroid therapy. 2. patient will need CT chest abdomen pelvis to evaluate if there is any systemic progression of disease. 3.  She does not have systemic progression of disease then we can consider treatment with Herceptin and add lapatinib. 4.  If she does have progression of systemic disease then we will consider treatment with Kadcyla.  I discussed with the patient and her son about prognosis of metastatic breast cancer with brain metastases.  It would primarily depend on whether her brain metastases can be controlled adequately. She still has good performance status.

## 2017-03-08 NOTE — Progress Notes (Signed)
Patient starts drinking contrast for CT scan at 1843.

## 2017-03-08 NOTE — Progress Notes (Signed)
Patient Demographics:    Denise Morton, is a 67 y.o. female, DOB - [birth year], RAQ:[id number]  Admit date - 03/07/2017   Admitting Physician Elwin Mocha, MD  Outpatient Primary MD for the patient is Seward Carol, MD  LOS - 1   Chief Complaint  Patient presents with  . Weakness        Subjective:    Denise Morton today has no fevers, no emesis,  No chest pain, dizziness, poor appetite,  Assessment  & Plan :    Active Problems:   Syncope  MRI Brain 03/07/17- IMPRESSION: 1. Heterogeneous mass measuring up to 6.4 cm in the right superior cerebellum with surrounding abnormal vascularity. No appreciable lesion was present on the 2016 PET-CT noncontrast portion. 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 Associated edema and mass effect partially effaces fourth ventricle. 2. Lateral and third ventriculomegaly is borderline for degree of volume loss and early hydrocephalus is possible. 3. Additional 3 mm enhancing focus in right occipital lobe may represent metastasis.  Brief Summary:- Denise Morton is a 67 y.o. female  With pmhx of BRCA, DM, admitted on 03/07/17 with weakness, dizziness, anorexia speech concerns and near syncope .  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     Plan:- 1)Brain Mets-oncology consult from Dr. Lindi Adie pending, continue Decadron, speech pathology consult appreciated  2)Dizziness/near syncope-secondary to #1 above, discussed with Dr. Malen Gauze from neurology service, advised oncology follow-up rather than neurology at this time  3)HTN-stage II, uncontrolled, amlodipine 10 mg daily, Coreg 12.5 mg twice daily,  may use IV Hydralazine 10 mg  Every 4 hours Prn for systolic blood pressure over 160 mmhg  4)DM-given Decadron use anticipate worsening hyperglycemia, Use Novolog/Humalog Sliding scale insulin with  Accu-Cheks/Fingersticks as ordered   Code Status : Full code  Disposition Plan  : TBD  Consults  :  Oncology/Speech/PT  DVT Prophylaxis  :  Lovenox   Lab Results  Component Value Date   PLT 157 03/08/2017    Inpatient Medications  Scheduled Meds: . amLODipine  10 mg Oral Daily  . carvedilol  12.5 mg Oral BID WC  . chlorhexidine  15 mL Mouth Rinse BID  . cloNIDine  0.1 mg Oral Once  . enoxaparin (LOVENOX) injection  40 mg Subcutaneous Q24H  . feeding supplement (ENSURE ENLIVE)  237 mL Oral BID BM  . insulin aspart  0-15 Units Subcutaneous TID WC  . mouth rinse  15 mL Mouth Rinse q12n4p   Continuous Infusions: PRN Meds:.acetaminophen **OR** acetaminophen, hydrALAZINE, labetalol, LORazepam, ondansetron **OR** ondansetron (ZOFRAN) IV, prochlorperazine    Anti-infectives (From admission, onward)   None        Objective:   Vitals:   03/08/17 0429 03/08/17 1401 03/08/17 1411 03/08/17 1520  BP: (!) 172/79 (!) 251/105 (!) 245/120 (!) 243/106  Pulse: 66   97  Resp: 16     Temp: 98.2 F (36.8 C) 97.6 F (36.4 C)    TempSrc: Oral Axillary    SpO2: 96%   96%  Weight: 100.6 kg (221 lb 12.5 oz)     Height:        Wt Readings from Last 3 Encounters:  03/08/17 100.6 kg (221 lb 12.5 oz)  02/08/17 90 kg (198 lb 8 oz)  01/11/17 103 kg (227 lb 1.6 oz)     Intake/Output Summary (Last 24 hours) at 03/08/2017 1531 Last data filed at 03/08/2017 1230 Gross per 24 hour  Intake 3794.58 ml  Output -  Net 3794.58 ml     Physical Exam  Gen:- Awake Alert,  In no apparent distress  HEENT:- [place].AT, No sclera icterus Neck-Supple Neck,No JVD,.  Lungs-  CTAB  CV- S1, S2 normal Abd-  +ve B.Sounds, Abd Soft, No tenderness,    Extremity/Skin:- No  edema,    Psych-affect is appropriate Neuro-no new focal deficits, dizzy spells persist   Data Review:   Micro Results No results found for this or any previous visit (from the past 240 hour(s)).  Radiology Reports Ct Head Wo  Contrast  Addendum Date: 03/07/2017   ADDENDUM REPORT: 03/07/2017 15:42 ADDENDUM: These results were called by telephone at the time of interpretation on 03/07/2017 at 3:42 pm to Dr. Deno Etienne , who verbally acknowledged these results. Electronically Signed   By: Abelardo Diesel M.D.   On: 03/07/2017 15:42   Result Date: 03/07/2017 CLINICAL DATA:  Slurred speech today at doctor's office. History of breast cancer. EXAM: CT HEAD WITHOUT CONTRAST TECHNIQUE: Contiguous axial images were obtained from the base of the skull through the vertex without intravenous contrast. COMPARISON:  None. FINDINGS: Brain: There are multiple calcifications of the right cerebellum with low-density and changes of edema involving the bilateral cerebellum. There is bilateral periventricular white matter small vessel ischemic change. There is no midline shift or hydrocephalus. No acute hemorrhage is identified. Vascular: No hyperdense vessel or unexpected calcification. Skull: Normal. Negative for fracture or focal lesion. Sinuses/Orbits: No acute finding. Other: None. IMPRESSION: Multiple calcifications of right cerebellum with low-density in changes of edema involving the bilateral cerebellum. Metastatic disease is suspected. Further evaluation with MRI of the brain is recommended. Electronically Signed: By: Abelardo Diesel M.D. On: 03/07/2017 15:32   Mr Brain W And Wo Contrast  Result Date: 03/07/2017 CLINICAL DATA:  67 y/o F; right cerebellar lesion suspected metastatic disease, initial workup. History of metastatic breast cancer. EXAM: MRI HEAD WITHOUT AND WITH CONTRAST TECHNIQUE: Multiplanar, multiecho pulse sequences of the brain and surrounding structures were obtained without and with intravenous contrast. CONTRAST:  21m MULTIHANCE GADOBENATE DIMEGLUMINE 529 MG/ML IV SOLN COMPARISON:  03/07/2017 CT head and 12/09/2014 PET-CT FINDINGS: Brain: Heterogeneous irregular marginated mass within the right superior cerebellum measuring  4.8 x 6.4 x 3.2 cm (AP x ML x CC series 11, image 17 and series 13, image 7). Mass demonstrates speckled T2 hypointense signal with susceptibility blooming corresponding to mineralization on CT. There are numerous tortuous blood vessel surrounding the mass. Surrounding edema extends into the right pons and left cerebellar hemisphere. Mass effect partially effaces the fourth ventricle and right aspect of quadrigeminal plate cistern. Mild brain parenchymal volume loss. Ventriculomegaly is borderline for the degree of volume loss. There is a background of moderate chronic microvascular ischemic changes of the brain and multiple foci of susceptibility hypointensity compatible with microhemorrhage. 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 2 mm focus of enhancement in right occipital lobe (series 11, image 30). Vascular: Patent central flow voids. Skull and upper cervical spine: Normal marrow signal. Sinuses/Orbits: Negative. Other: None. IMPRESSION: 1. Heterogeneous mass measuring up to 6.4 cm in the right superior cerebellum with surrounding abnormal vascularity. No appreciable lesion was present on the 2016 PET-CT noncontrast portion. Findings likely  represent an interval highly vascular metastasis. Associated edema and mass effect partially effaces fourth ventricle. 2. Lateral and third ventriculomegaly is borderline for degree of volume loss and early hydrocephalus is possible. 3. Additional 3 mm enhancing focus in right occipital lobe may represent metastasis. Electronically Signed   By: Kristine Garbe M.D.   On: 03/07/2017 18:57   US Abdomen Complete  Result Date: 02/15/2017 CLINICAL DATA:  Nausea. EXAM: ABDOMEN ULTRASOUND COMPLETE COMPARISON:  CT scan of October 18, 2016. FINDINGS: Gallbladder: No gallstones or wall thickening visualized. No sonographic Murphy sign noted by sonographer. Common bile duct: Diameter: 3.8 mm which is within normal limits. Liver: No  focal lesion identified. Increased echogenicity of hepatic parenchyma is noted suggesting fatty infiltration. Portal vein is patent on color Doppler imaging with normal direction of blood flow towards the liver. IVC: No abnormality visualized. Pancreas: Visualized portion unremarkable. Spleen: Size and appearance within normal limits. Right Kidney: Length: 10.9 cm. 2.6 cm cyst is noted in midpole. 8 mm nonobstructive calculus is noted. Echogenicity within normal limits. No mass or hydronephrosis visualized. Left Kidney: Length: 10.8 cm. 6 mm calculus is noted. Echogenicity within normal limits. No mass or hydronephrosis visualized. Abdominal aorta: No aneurysm visualized. Other findings: None. IMPRESSION: Bilateral nonobstructive nephrolithiasis. Increased echogenicity of hepatic parenchyma is noted suggesting fatty infiltration. Electronically Signed   By: Marijo Conception, M.D.   On: 02/15/2017 15:22     CBC Recent Labs  Lab 03/07/17 1336 03/08/17 0347  WBC 8.5 4.7  HGB 14.5 11.6*  HCT 44.2 34.6*  PLT 206 157  MCV 94.6 94.0  MCH 31.0 31.5  MCHC 32.8 33.5  RDW 15.3 15.4  LYMPHSABS 1.5  --   MONOABS 0.4  --   EOSABS 0.0  --   BASOSABS 0.0  --     Chemistries  Recent Labs  Lab 03/07/17 1336 03/08/17 0347  NA 142 141  K 3.9 3.8  CL 104 114*  CO2 25 19*  GLUCOSE 191* 176*  BUN 9 10  CREATININE 1.12* 0.65  CALCIUM 9.5 7.8*   ------------------------------------------------------------------------------------------------------------------ No results for input(s): CHOL, HDL, LDLCALC, TRIG, CHOLHDL, LDLDIRECT in the last 72 hours.  No results found for: HGBA1C ------------------------------------------------------------------------------------------------------------------ No results for input(s): TSH, T4TOTAL, T3FREE, THYROIDAB in the last 72 hours.  Invalid input(s):  FREET3 ------------------------------------------------------------------------------------------------------------------ No results for input(s): VITAMINB12, FOLATE, FERRITIN, TIBC, IRON, RETICCTPCT in the last 72 hours.  Coagulation profile No results for input(s): INR, PROTIME in the last 168 hours.  No results for input(s): DDIMER in the last 72 hours.  Cardiac Enzymes No results for input(s): CKMB, TROPONINI, MYOGLOBIN in the last 168 hours.  Invalid input(s): CK ------------------------------------------------------------------------------------------------------------------ No results found for: BNP   Roxan Hockey M.D on 03/08/2017 at 3:31 PM  Between 7am to 7pm - Pager - [phone]  After 7pm go to [email or web address] - password [email or web address]  [facility] -  Office  [phone]   Voice Recognition Viviann Spare dictation system was used to create this note, attempts have been made to correct errors. Please contact the author with questions and/or clarifications.

## 2017-03-09 LAB — GLUCOSE, CAPILLARY
GLUCOSE-CAPILLARY: 293 mg/dL — AB (ref 65–99)
Glucose-Capillary: 176 mg/dL — ABNORMAL HIGH (ref 65–99)
Glucose-Capillary: 158 mg/dL — ABNORMAL HIGH (ref 65–99)
Glucose-Capillary: 234 mg/dL — ABNORMAL HIGH (ref 65–99)

## 2017-03-09 MED ORDER — NYSTATIN-TRIAMCINOLONE 100000-0.1 UNIT/GM-% EX CREA
TOPICAL_CREAM | Freq: Two times a day (BID) | CUTANEOUS | Status: DC
  Administered 2017-03-09 – 2017-03-10 (×3): via TOPICAL
  Administered 2017-03-11: 1 via TOPICAL
  Administered 2017-03-11 – 2017-03-14 (×4): via TOPICAL
  Administered 2017-03-15: 1 via TOPICAL
  Administered 2017-03-15 – 2017-03-22 (×14): via TOPICAL
  Administered 2017-03-23: 1 via TOPICAL
  Administered 2017-03-23 – 2017-03-27 (×9): via TOPICAL
  Administered 2017-03-28: 1 via TOPICAL
  Administered 2017-03-28: 13:00:00 via TOPICAL
  Administered 2017-03-29 (×2): 1 via TOPICAL
  Administered 2017-03-30 (×2): via TOPICAL
  Administered 2017-03-31: 1 via TOPICAL
  Administered 2017-03-31 – 2017-04-01 (×2): via TOPICAL
  Administered 2017-04-01 – 2017-04-02 (×2): 1 via TOPICAL
  Administered 2017-04-02 – 2017-04-14 (×24): via TOPICAL
  Filled 2017-03-09 (×6): qty 30

## 2017-03-09 NOTE — Progress Notes (Signed)
Occupational Therapy Treatment Patient Details Name: Denise Morton MRN: [id number] DOB: 09-13-1950 Today's Date: 03/09/2017    History of present illness  Denise Morton is a 67 y.o. female  With pmhx of BRCA, DM, gastritis presents with weakness.  Patient states that for the past few months she has had decreased appetite and weakness.  Pt had hypotensive episode at MD office, was sent to hospital.  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   OT comments  Ambulated to bathroom and participated in AROM UE exercises.  Pt fatiques easily, but she is very motivated   Follow Up Recommendations  (plan is for crani at [facility]; will update after this)    Equipment Recommendations  3 in 1 bedside commode    Recommendations for Other Services      Precautions / Restrictions Precautions Precautions: Fall Restrictions Weight Bearing Restrictions: No       Mobility Bed Mobility         Supine to sit: Supervision        Transfers       Sit to Stand: Min assist;Min guard         General transfer comment: min assist from bed to steady.  Min guard from commode using grab bar    Balance                                           ADL either performed or assessed with clinical judgement   ADL       Grooming: Wash/dry hands;Supervision/safety;Standing                   Toilet Transfer: Min guard;Minimal assistance;Ambulation;RW;Comfort height toilet             General ADL Comments: ambulated to bathroom for the above.  Sat EOB for AROM exercises     Vision       Perception     Praxis      Cognition Arousal/Alertness: Awake/alert Behavior During Therapy: WFL for tasks assessed/performed Overall Cognitive Status: Within Functional Limits for tasks assessed                                 General Comments: pt has a little difficulty with word finding        Exercises Exercises: Other exercises Other Exercises Other  Exercises: AROM x 1 set of 10, FF, Abd, and Horizontal Abd:  may benefit from level one theraband on next visit.  Rest breaks between sets; sat EOB   Shoulder Instructions       General Comments      Pertinent Vitals/ Pain       Pain Assessment: No/denies pain  Home Living                                          Prior Functioning/Environment              Frequency  Min 2X/week        Progress Toward Goals  OT Goals(current goals can now be found in the care plan section)  Progress towards OT goals: Progressing toward goals     Plan      Co-evaluation  AM-PAC PT "6 Clicks" Daily Activity     Outcome Measure   Help from another person eating meals?: None Help from another person taking care of personal grooming?: A Little Help from another person toileting, which includes using toliet, bedpan, or urinal?: A Lot Help from another person bathing (including washing, rinsing, drying)?: A Lot Help from another person to put on and taking off regular upper body clothing?: A Little Help from another person to put on and taking off regular lower body clothing?: A Lot 6 Click Score: 16    End of Session    OT Visit Diagnosis: Unsteadiness on feet (R26.81);Muscle weakness (generalized) (M62.81)   Activity Tolerance Patient tolerated treatment well   Patient Left in bed;with call bell/phone within reach;with bed alarm set   Nurse Communication          Time: 8883-5844 OT Time Calculation (min): 17 min  Charges: OT General Charges $OT Visit: 1 Visit OT Treatments $Self Care/Home Management : 8-22 mins  Lesle Chris, OTR/L [phone] 03/09/2017   Tomoki Lucken 03/09/2017, 3:22 PM

## 2017-03-09 NOTE — Progress Notes (Signed)
Patient Demographics:    Denise Morton, is a 67 y.o. female, DOB - 1950/08/18, DDU:[id number]  Admit date - 03/07/2017   Admitting Physician Elwin Mocha, MD  Outpatient Primary MD for the patient is Seward Carol, MD  LOS - 2   Chief Complaint  Patient presents with  . Weakness        Subjective:    Denise Morton today has no fevers, no emesis,  No chest pain, no severe headaches,  Assessment  & Plan :    Active Problems:   Syncope  MRI Brain 03/07/17- IMPRESSION: 1. Heterogeneous mass measuring up to 6.4 cm in the right superior cerebellum with surrounding abnormal vascularity. No appreciable lesion was present on the 2016 PET-CT noncontrast portion. 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 Associated edema and mass effect partially effaces fourth ventricle. 2. Lateral and third ventriculomegaly is borderline for degree of volume loss and early hydrocephalus is possible. 3. Additional 3 mm enhancing focus in right occipital lobe may represent metastasis.  Brief Summary:- Denise Morton is a 67 y.o. female  With pmhx of BRCA, DM, admitted on 03/07/17 with weakness, dizziness, anorexia speech concerns and near syncope .  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     Plan:- 1)Brain Mets-   As per  Dr. Saintclair Halsted (neurosurgery)  plan is to proceed with surgical debulking/resection of brain tumor on 03/13/17 at [facility].  anticipate postop SRS and Dr. Lisbeth Renshaw would likely consider fractionating her treatment. Her case will be presented in brain/spine oncology conference next week , oncology consult from Dr. Lindi Adie appreciated, continue Decadron,   2)HTN-stage II, improving BP control, c/n  amlodipine 10 mg daily, Coreg 12.5 mg twice daily,  may use IV Hydralazine 10 mg  Every 4 hours Prn for systolic blood pressure over 160 mmhg  4)DM-   anticipate worsening hyperglycemia due to steroids/Decadron, Use Novolog/Humalog Sliding scale insulin with Accu-Cheks/Fingersticks as ordered   Code Status : Full code  Disposition Plan  : Transfer to [facility] on 03/10/17  Consults  :  Oncology/Speech/PT  DVT Prophylaxis  :  Lovenox (will  Need to be held on 03/12/17 to allow for surgery on 03/13/17)  Lab Results  Component Value Date   PLT 157 03/08/2017    Inpatient Medications  Scheduled Meds: . amLODipine  10 mg Oral Daily  . carvedilol  12.5 mg Oral BID WC  . chlorhexidine  15 mL Mouth Rinse BID  . dexamethasone  4 mg Oral Q12H  . enoxaparin (LOVENOX) injection  40 mg Subcutaneous Q24H  . feeding supplement (ENSURE ENLIVE)  237 mL Oral BID BM  . insulin aspart  0-15 Units Subcutaneous TID WC  . mouth rinse  15 mL Mouth Rinse q12n4p  . nystatin-triamcinolone   Topical BID  . pantoprazole  40 mg Oral Daily   Continuous Infusions: PRN Meds:.acetaminophen **OR** acetaminophen, hydrALAZINE, labetalol, LORazepam, ondansetron **OR** ondansetron (ZOFRAN) IV, oxyCODONE, prochlorperazine    Anti-infectives (From admission, onward)   None        Objective:   Vitals:   03/09/17 0939 03/09/17 1008 03/09/17 1043 03/09/17 1326  BP: (!) 181/95 (!) 178/91 (!) 151/74 130/63  Pulse: 82  73 69  Resp:    20  Temp:    98.6 F (37 C)  TempSrc:    Oral  SpO2:    94%  Weight:      Height:        Wt Readings from Last 3 Encounters:  03/08/17 100.6 kg (221 lb 12.5 oz)  02/08/17 90 kg (198 lb 8 oz)  01/11/17 103 kg (227 lb 1.6 oz)     Intake/Output Summary (Last 24 hours) at 03/09/2017 1903 Last data filed at 03/08/2017 2234 Gross per 24 hour  Intake 30 ml  Output 1000 ml  Net -970 ml     Physical Exam  Gen:- Awake Alert,  In no apparent distress  HEENT:- [place].AT, No sclera icterus Neck-Supple Neck,No JVD,.  Lungs-  CTAB  CV- S1, S2 normal Abd-  +ve B.Sounds, Abd Soft, No tenderness,    Extremity/Skin:- No  edema,     Psych-affect is appropriate Neuro-no new focal deficits, dizzy spells persist   Data Review:   Micro Results No results found for this or any previous visit (from the past 240 hour(s)).  Radiology Reports Ct Head Wo Contrast  Addendum Date: 03/07/2017   ADDENDUM REPORT: 03/07/2017 15:42 ADDENDUM: These results were called by telephone at the time of interpretation on 03/07/2017 at 3:42 pm to Dr. Deno Etienne , who verbally acknowledged these results. Electronically Signed   By: Abelardo Diesel M.D.   On: 03/07/2017 15:42   Result Date: 03/07/2017 CLINICAL DATA:  Slurred speech today at doctor's office. History of breast cancer. EXAM: CT HEAD WITHOUT CONTRAST TECHNIQUE: Contiguous axial images were obtained from the base of the skull through the vertex without intravenous contrast. COMPARISON:  None. FINDINGS: Brain: There are multiple calcifications of the right cerebellum with low-density and changes of edema involving the bilateral cerebellum. There is bilateral periventricular white matter small vessel ischemic change. There is no midline shift or hydrocephalus. No acute hemorrhage is identified. Vascular: No hyperdense vessel or unexpected calcification. Skull: Normal. Negative for fracture or focal lesion. Sinuses/Orbits: No acute finding. Other: None. IMPRESSION: Multiple calcifications of right cerebellum with low-density in changes of edema involving the bilateral cerebellum. Metastatic disease is suspected. Further evaluation with MRI of the brain is recommended. Electronically Signed: By: Abelardo Diesel M.D. On: 03/07/2017 15:32   Ct Chest W Contrast  Result Date: 03/09/2017 CLINICAL DATA:  Restaging workup of metastatic breast cancer with intracranial metastatic disease. EXAM: CT CHEST, ABDOMEN, AND PELVIS WITH CONTRAST TECHNIQUE: Multidetector CT imaging of the chest, abdomen and pelvis was performed following the standard protocol during bolus administration of intravenous contrast.  CONTRAST:  43m ISOVUE-300 IOPAMIDOL (ISOVUE-300) INJECTION 61%, 1049mISOVUE-300 IOPAMIDOL (ISOVUE-300) INJECTION 61% COMPARISON:  Multiple exams, including 10/18/2016 FINDINGS: CT CHEST FINDINGS Cardiovascular: Left Port-A-Cath tip: Lower SVC. Coronary, aortic arch, and branch vessel atherosclerotic vascular disease. Mediastinum/Nodes: Indistinctly marginated right axial nodularity with short axis diameter of 0.8 cm, stable. This is likely a small axillary lymph node. Lungs/Pleura: Trace bilateral pleural effusions. Centrilobular emphysema. Mild scarring in the right middle lobe and lingula. Musculoskeletal: Chronic mild anterior wedging of the T11 vertebral body. Cutaneous thickening and subcutaneous stranding in the right breast similar to the prior exam. CT ABDOMEN PELVIS FINDINGS Hepatobiliary: Mild fatty infiltration of the liver with some sparing along the gallbladder fossa. No focal hepatic mass identified. Gallbladder unremarkable. Pancreas: Unremarkable Spleen: Unremarkable Adrenals/Urinary Tract: Stable fullness of both adrenal glands without a well-defined mass. Stable exophytic homogeneous hypodense 2.4 cm fluid density lesion from the  right mid kidney favoring cyst. Nonspecific hypodense 8 mm lesion of the left kidney lower pole is technically nonspecific due to small size. Similarly there is a nonspecific 6 mm lesion of the right kidney lower pole. Bilateral nonobstructive nephrolithiasis. The largest of the 6 right renal calculi is 5 mm in long axis on image 115/5. The largest of the 7 left renal calculi is 6 mm in long axis on image 84/5. No hydronephrosis, hydroureter, or ureteral/bladder calculus. Stomach/Bowel: Sigmoid colon diverticulosis without active diverticulitis. Low position of the anorectal junction compatible with pelvic floor laxity. Vascular/Lymphatic: Aortoiliac atherosclerotic vascular disease. No pathologic adenopathy identified. Reproductive: Enhancing lesion along the posterior  margin of the uterus measuring 2.1 by 1.3 cm today, formerly the same by my measurement. Looking back further this measured 1.6 by 1.3 cm on 03/10/2015. This lesion was mildly hypermetabolic on 23/53/6144. Adnexa unremarkable. Other: No supplemental non-categorized findings. Musculoskeletal: Grade 1 degenerative anterolisthesis at L4-5 with associated degenerative disc disease. No compelling findings of osseous metastatic disease. Umbilical hernia contains adipose tissue. IMPRESSION: 1. No convincing findings for metastatic disease in the chest, abdomen, or pelvis. There is some continued cutaneous thickening and subcutaneous stranding in the right breast much of which may be due to prior therapy. 2. A small posterior uterine mass has mildly enlarged over the last 3 years, and was previously mildly hypermetabolic (although metabolic activity does not differentiate benign from malignant myometrial masses). Given the very slow degree of enlargement is most likely that this is indeed a benign fibroid with some postmenopausal enlargement. Attention directed at this lesion on follow up studies is suggested to ensure lack of accelerated growth. 3. Other imaging findings of potential clinical significance: Aortic Atherosclerosis (ICD10-I70.0) and Emphysema (ICD10-J43.9). Coronary atherosclerosis. Trace bilateral pleural effusions. Hepatic steatosis. Stable fullness of both adrenal glands. Bilateral nonobstructive nephrolithiasis. Sigmoid diverticulosis. Pelvic floor laxity. Lumbar degenerative disc disease. Umbilical hernia containing adipose tissue. Electronically Signed   By: Van Clines M.D.   On: 03/09/2017 07:08   Mr Brain W And Wo Contrast  Result Date: 03/07/2017 CLINICAL DATA:  67 y/o F; right cerebellar lesion suspected metastatic disease, initial workup. History of metastatic breast cancer. EXAM: MRI HEAD WITHOUT AND WITH CONTRAST TECHNIQUE: Multiplanar, multiecho pulse sequences of the brain and  surrounding structures were obtained without and with intravenous contrast. CONTRAST:  86m MULTIHANCE GADOBENATE DIMEGLUMINE 529 MG/ML IV SOLN COMPARISON:  03/07/2017 CT head and 12/09/2014 PET-CT FINDINGS: Brain: Heterogeneous irregular marginated mass within the right superior cerebellum measuring 4.8 x 6.4 x 3.2 cm (AP x ML x CC series 11, image 17 and series 13, image 7). Mass demonstrates speckled T2 hypointense signal with susceptibility blooming corresponding to mineralization on CT. There are numerous tortuous blood vessel surrounding the mass. Surrounding edema extends into the right pons and left cerebellar hemisphere. Mass effect partially effaces the fourth ventricle and right aspect of quadrigeminal plate cistern. Mild brain parenchymal volume loss. Ventriculomegaly is borderline for the degree of volume loss. There is a background of moderate chronic microvascular ischemic changes of the brain and multiple foci of susceptibility hypointensity compatible with microhemorrhage. 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 2 mm focus of enhancement in right occipital lobe (series 11, image 30). Vascular: Patent central flow voids. Skull and upper cervical spine: Normal marrow signal. Sinuses/Orbits: Negative. Other: None. IMPRESSION: 1. Heterogeneous mass measuring up to 6.4 cm in the right superior cerebellum with surrounding abnormal vascularity. No appreciable lesion was present on the  2016 PET-CT noncontrast portion. 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 Associated edema and mass effect partially effaces fourth ventricle. 2. Lateral and third ventriculomegaly is borderline for degree of volume loss and early hydrocephalus is possible. 3. Additional 3 mm enhancing focus in right occipital lobe may represent metastasis. Electronically Signed   By: Kristine Garbe M.D.   On: 03/07/2017 18:57   US Abdomen Complete  Result Date:  02/15/2017 CLINICAL DATA:  Nausea. EXAM: ABDOMEN ULTRASOUND COMPLETE COMPARISON:  CT scan of October 18, 2016. FINDINGS: Gallbladder: No gallstones or wall thickening visualized. No sonographic Murphy sign noted by sonographer. Common bile duct: Diameter: 3.8 mm which is within normal limits. Liver: No focal lesion identified. Increased echogenicity of hepatic parenchyma is noted suggesting fatty infiltration. Portal vein is patent on color Doppler imaging with normal direction of blood flow towards the liver. IVC: No abnormality visualized. Pancreas: Visualized portion unremarkable. Spleen: Size and appearance within normal limits. Right Kidney: Length: 10.9 cm. 2.6 cm cyst is noted in midpole. 8 mm nonobstructive calculus is noted. Echogenicity within normal limits. No mass or hydronephrosis visualized. Left Kidney: Length: 10.8 cm. 6 mm calculus is noted. Echogenicity within normal limits. No mass or hydronephrosis visualized. Abdominal aorta: No aneurysm visualized. Other findings: None. IMPRESSION: Bilateral nonobstructive nephrolithiasis. Increased echogenicity of hepatic parenchyma is noted suggesting fatty infiltration. Electronically Signed   By: Marijo Conception, M.D.   On: 02/15/2017 15:22   Ct Abdomen Pelvis W Contrast  Result Date: 03/09/2017 CLINICAL DATA:  Restaging workup of metastatic breast cancer with intracranial metastatic disease. EXAM: CT CHEST, ABDOMEN, AND PELVIS WITH CONTRAST TECHNIQUE: Multidetector CT imaging of the chest, abdomen and pelvis was performed following the standard protocol during bolus administration of intravenous contrast. CONTRAST:  79m ISOVUE-300 IOPAMIDOL (ISOVUE-300) INJECTION 61%, 1076mISOVUE-300 IOPAMIDOL (ISOVUE-300) INJECTION 61% COMPARISON:  Multiple exams, including 10/18/2016 FINDINGS: CT CHEST FINDINGS Cardiovascular: Left Port-A-Cath tip: Lower SVC. Coronary, aortic arch, and branch vessel atherosclerotic vascular disease. Mediastinum/Nodes: Indistinctly  marginated right axial nodularity with short axis diameter of 0.8 cm, stable. This is likely a small axillary lymph node. Lungs/Pleura: Trace bilateral pleural effusions. Centrilobular emphysema. Mild scarring in the right middle lobe and lingula. Musculoskeletal: Chronic mild anterior wedging of the T11 vertebral body. Cutaneous thickening and subcutaneous stranding in the right breast similar to the prior exam. CT ABDOMEN PELVIS FINDINGS Hepatobiliary: Mild fatty infiltration of the liver with some sparing along the gallbladder fossa. No focal hepatic mass identified. Gallbladder unremarkable. Pancreas: Unremarkable Spleen: Unremarkable Adrenals/Urinary Tract: Stable fullness of both adrenal glands without a well-defined mass. Stable exophytic homogeneous hypodense 2.4 cm fluid density lesion from the right mid kidney favoring cyst. Nonspecific hypodense 8 mm lesion of the left kidney lower pole is technically nonspecific due to small size. Similarly there is a nonspecific 6 mm lesion of the right kidney lower pole. Bilateral nonobstructive nephrolithiasis. The largest of the 6 right renal calculi is 5 mm in long axis on image 115/5. The largest of the 7 left renal calculi is 6 mm in long axis on image 84/5. No hydronephrosis, hydroureter, or ureteral/bladder calculus. Stomach/Bowel: Sigmoid colon diverticulosis without active diverticulitis. Low position of the anorectal junction compatible with pelvic floor laxity. Vascular/Lymphatic: Aortoiliac atherosclerotic vascular disease. No pathologic adenopathy identified. Reproductive: Enhancing lesion along the posterior margin of the uterus measuring 2.1 by 1.3 cm today, formerly the same by my measurement. Looking back further this measured 1.6 by 1.3 cm on 03/10/2015. This lesion was mildly hypermetabolic  on 12/09/2014. Adnexa unremarkable. Other: No supplemental non-categorized findings. Musculoskeletal: Grade 1 degenerative anterolisthesis at L4-5 with associated  degenerative disc disease. No compelling findings of osseous metastatic disease. Umbilical hernia contains adipose tissue. IMPRESSION: 1. No convincing findings for metastatic disease in the chest, abdomen, or pelvis. There is some continued cutaneous thickening and subcutaneous stranding in the right breast much of which may be due to prior therapy. 2. A small posterior uterine mass has mildly enlarged over the last 3 years, and was previously mildly hypermetabolic (although metabolic activity does not differentiate benign from malignant myometrial masses). Given the very slow degree of enlargement is most likely that this is indeed a benign fibroid with some postmenopausal enlargement. Attention directed at this lesion on follow up studies is suggested to ensure lack of accelerated growth. 3. Other imaging findings of potential clinical significance: Aortic Atherosclerosis (ICD10-I70.0) and Emphysema (ICD10-J43.9). Coronary atherosclerosis. Trace bilateral pleural effusions. Hepatic steatosis. Stable fullness of both adrenal glands. Bilateral nonobstructive nephrolithiasis. Sigmoid diverticulosis. Pelvic floor laxity. Lumbar degenerative disc disease. Umbilical hernia containing adipose tissue. Electronically Signed   By: Van Clines M.D.   On: 03/09/2017 07:08     CBC Recent Labs  Lab 03/07/17 1336 03/08/17 0347  WBC 8.5 4.7  HGB 14.5 11.6*  HCT 44.2 34.6*  PLT 206 157  MCV 94.6 94.0  MCH 31.0 31.5  MCHC 32.8 33.5  RDW 15.3 15.4  LYMPHSABS 1.5  --   MONOABS 0.4  --   EOSABS 0.0  --   BASOSABS 0.0  --     Chemistries  Recent Labs  Lab 03/07/17 1336 03/08/17 0347  NA 142 141  K 3.9 3.8  CL 104 114*  CO2 25 19*  GLUCOSE 191* 176*  BUN 9 10  CREATININE 1.12* 0.65  CALCIUM 9.5 7.8*   ------------------------------------------------------------------------------------------------------------------ No results for input(s): CHOL, HDL, LDLCALC, TRIG, CHOLHDL, LDLDIRECT in the  last 72 hours.  No results found for: HGBA1C ------------------------------------------------------------------------------------------------------------------ No results for input(s): TSH, T4TOTAL, T3FREE, THYROIDAB in the last 72 hours.  Invalid input(s): FREET3 ------------------------------------------------------------------------------------------------------------------ No results for input(s): VITAMINB12, FOLATE, FERRITIN, TIBC, IRON, RETICCTPCT in the last 72 hours.  Coagulation profile No results for input(s): INR, PROTIME in the last 168 hours.  No results for input(s): DDIMER in the last 72 hours.  Cardiac Enzymes No results for input(s): CKMB, TROPONINI, MYOGLOBIN in the last 168 hours.  Invalid input(s): CK ------------------------------------------------------------------------------------------------------------------ No results found for: BNP   Roxan Hockey M.D on 03/09/2017 at 7:03 PM  Between 7am to 7pm - Pager - [phone]  After 7pm go to [email or web address] - password [email or web address]  [facility] -  Office  [phone]   Voice Recognition Viviann Spare dictation system was used to create this note, attempts have been made to correct errors. Please contact the author with questions and/or clarifications.

## 2017-03-09 NOTE — Progress Notes (Signed)
Patient ID: Denise Morton, female   DOB: [birth year], 67 y.o.   MRN: [id number] Patient doing well improved headache improved speech   neurologically stable still with a little bit of right greater than left dysmetria   plan craniectomy for resection of mass on Monday patient I believe is being [facility].  I have extensively gone over the risks and benefits of the operation with the patient as well as perioperative course expectations of outcome and alternatives of surgery and she understands and agrees to proceed forward.  I met with her and her son

## 2017-03-09 NOTE — Progress Notes (Addendum)
I spoke with Dr. Saintclair Halsted after he was able to see and evaluate the patient and the plan is to proceed with surgical debulking/resection of her tumor. She is going to be transferred to [facility] in anticipation of that in the next day or so, with plans for surgery on 03/13/17. We would anticipate postop SRS and Dr. Lisbeth Renshaw would likely consider fractionating her treatment. Her case will be presented in brain/spine oncology conference next week as well and we will follow up with her in the outpatient setting to discuss approach for SRS. We anticipate a 3T postop MRI as well and would plan treatment from that imaging. The patient is aware of our plans and we will meet back to discuss treatment following her surgery.    Carola Rhine, PAC

## 2017-03-09 NOTE — Progress Notes (Signed)
LCSW acknowledges CSW consult for SN[facility].  After chart review patient is going to [facility] for surgery in 2 days.   LCSW will follow for disposition after surgery.   LCSW will continue to follow.   Carolin Coy [place] [facility] [place]

## 2017-03-10 LAB — COMPREHENSIVE METABOLIC PANEL
ALBUMIN: 3.3 g/dL — AB (ref 3.5–5.0)
ALT: 35 U/L (ref 14–54)
ANION GAP: 12 (ref 5–15)
AST: 34 U/L (ref 15–41)
Alkaline Phosphatase: 62 U/L (ref 38–126)
BILIRUBIN TOTAL: 1.2 mg/dL (ref 0.3–1.2)
BUN: 22 mg/dL — ABNORMAL HIGH (ref 6–20)
CHLORIDE: 100 mmol/L — AB (ref 101–111)
CO2: 23 mmol/L (ref 22–32)
Calcium: 9.4 mg/dL (ref 8.9–10.3)
Creatinine, Ser: 0.79 mg/dL (ref 0.44–1.00)
GFR calc Af Amer: >60 mL/min (ref >60)
GFR calc non Af Amer: >60 mL/min (ref >60)
GLUCOSE: 224 mg/dL — AB (ref 65–99)
Potassium: 5.2 mmol/L — ABNORMAL HIGH (ref 3.5–5.1)
Sodium: 135 mmol/L (ref 135–145)
TOTAL PROTEIN: 6.6 g/dL (ref 6.5–8.1)

## 2017-03-10 LAB — GLUCOSE, CAPILLARY
GLUCOSE-CAPILLARY: 237 mg/dL — AB (ref 65–99)
Glucose-Capillary: 315 mg/dL — ABNORMAL HIGH (ref 65–99)
Glucose-Capillary: 133 mg/dL — ABNORMAL HIGH (ref 65–99)
Glucose-Capillary: 210 mg/dL — ABNORMAL HIGH (ref 65–99)

## 2017-03-10 MED ORDER — MIRTAZAPINE 15 MG PO TBDP
15.0000 mg | ORAL_TABLET | Freq: Every day | ORAL | Status: DC
  Administered 2017-03-10 – 2017-04-13 (×35): 15 mg via ORAL
  Filled 2017-03-10 (×37): qty 1

## 2017-03-10 NOTE — Progress Notes (Signed)
Patient ID: Denise Morton, female   DOB: 15-Jul-1950, 67 y.o.   MRN: [id number] Patients surgery is at 730 am Monday.  Will need to be transferred to [facility] over the weekend

## 2017-03-10 NOTE — Progress Notes (Signed)
Patient Demographics:    Denise Morton, is a 67 y.o. female, DOB - 02/19/1950, NLZ:[id number]  Admit date - 03/07/2017   Admitting Physician Elwin Mocha, MD  Outpatient Primary MD for the patient is Seward Carol, MD  LOS - 3   Chief Complaint  Patient presents with  . Weakness        Subjective:    Corinna Rosemond today has no fevers, no emesis,  No chest pain, no severe headaches, eating ok on 03/10/17  Assessment  & Plan :    Active Problems:   Syncope  MRI Brain 03/07/17- IMPRESSION: 1. Heterogeneous mass measuring up to 6.4 cm in the right superior cerebellum with surrounding abnormal vascularity. No appreciable lesion was present on the 2016 PET-CT noncontrast portion. 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 Associated edema and mass effect partially effaces fourth ventricle. 2. Lateral and third ventriculomegaly is borderline for degree of volume loss and early hydrocephalus is possible. 3. Additional 3 mm enhancing focus in right occipital lobe may represent metastasis.  Brief Summary:- Denise Morton is a 68 y.o. female  With pmhx of BRCA, DM, admitted on 03/07/17 with weakness, dizziness, anorexia , speech concerns and near syncope .  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 Transfer from [facility] to [facility] for urgical debulking/resection of brain tumor on 03/13/17 at [place]:- 1)Brain Mets-   As per  Dr. Saintclair Halsted (neurosurgery)  plan is to proceed with surgical debulking/resection of brain tumor on 03/13/17 at [facility].  anticipate postop SRS and Dr. Lisbeth Renshaw would likely consider fractionating her treatment. Her case will be presented in brain/spine oncology conference next week , oncology consult from Dr. Lindi Adie appreciated, continue Decadron,   2)HTN-stage II, improving BP control, c/n  amlodipine 10 mg daily, Coreg  12.5 mg twice daily,  may use IV Hydralazine 10 mg  Every 4 hours Prn for systolic blood pressure over 160 mmhg or iv Labetalol prn  4)DM-  anticipate worsening hyperglycemia due to steroids/Decadron, Use Novolog/Humalog Sliding scale insulin with Accu-Cheks/Fingersticks as ordered  5)Anorexia and Insomnia-  Give Remeron 15 mg qhs    Code Status : Full code  Disposition Plan  : Transfer [facility] for urgical debulking/resection of brain tumor on 03/13/17 at [facility]  :  Oncology/Speech/PT  DVT Prophylaxis  :  Lovenox (will  Need to be held after 03/11/17 to allow for surgery on 03/13/17)  Lab Results  Component Value Date   PLT 157 03/08/2017    Inpatient Medications  Scheduled Meds: . amLODipine  10 mg Oral Daily  . carvedilol  12.5 mg Oral BID WC  . chlorhexidine  15 mL Mouth Rinse BID  . dexamethasone  4 mg Oral Q12H  . enoxaparin (LOVENOX) injection  40 mg Subcutaneous Q24H  . feeding supplement (ENSURE ENLIVE)  237 mL Oral BID BM  . insulin aspart  0-15 Units Subcutaneous TID WC  . mouth rinse  15 mL Mouth Rinse q12n4p  . mirtazapine  15 mg Oral QHS  . nystatin-triamcinolone   Topical BID  . pantoprazole  40 mg Oral Daily   Continuous Infusions: PRN Meds:.acetaminophen **OR** acetaminophen,  hydrALAZINE, labetalol, LORazepam, ondansetron **OR** ondansetron (ZOFRAN) IV, oxyCODONE, prochlorperazine    Anti-infectives (From admission, onward)   None        Objective:   Vitals:   03/09/17 1953 03/10/17 0455 03/10/17 0557 03/10/17 1404  BP: (!) 157/96 (!) 192/110 (!) 177/89 (!) 158/90  Pulse: 77 65  67  Resp: (!) 22 20  16   Temp: 98.1 F (36.7 C) 98.2 F (36.8 C)  98.1 F (36.7 C)  TempSrc: Oral Oral  Oral  SpO2: 96% 95%  97%  Weight:      Height:        Wt Readings from Last 3 Encounters:  03/08/17 100.6 kg (221 lb 12.5 oz)  02/08/17 90 kg (198 lb 8 oz)  01/11/17 103 kg (227 lb 1.6 oz)     Intake/Output Summary (Last 24 hours) at  03/10/2017 1720 Last data filed at 03/10/2017 1400 Gross per 24 hour  Intake 425 ml  Output -  Net 425 ml     Physical Exam  Gen:- Awake Alert,  In no apparent distress  HEENT:- [place].AT, No sclera icterus Neck-Supple Neck,No JVD,.  Lungs-  CTAB  CV- S1, S2 normal Abd-  +ve B.Sounds, Abd Soft, No tenderness,    Extremity/Skin:- No  edema,    Psych-affect is appropriate Neuro-no new focal deficits, dizzy spells persist   Data Review:   Micro Results No results found for this or any previous visit (from the past 240 hour(s)).  Radiology Reports Ct Head Wo Contrast  Addendum Date: 03/07/2017   ADDENDUM REPORT: 03/07/2017 15:42 ADDENDUM: These results were called by telephone at the time of interpretation on 03/07/2017 at 3:42 pm to Dr. Deno Etienne , who verbally acknowledged these results. Electronically Signed   By: Abelardo Diesel M.D.   On: 03/07/2017 15:42   Result Date: 03/07/2017 CLINICAL DATA:  Slurred speech today at doctor's office. History of breast cancer. EXAM: CT HEAD WITHOUT CONTRAST TECHNIQUE: Contiguous axial images were obtained from the base of the skull through the vertex without intravenous contrast. COMPARISON:  None. FINDINGS: Brain: There are multiple calcifications of the right cerebellum with low-density and changes of edema involving the bilateral cerebellum. There is bilateral periventricular white matter small vessel ischemic change. There is no midline shift or hydrocephalus. No acute hemorrhage is identified. Vascular: No hyperdense vessel or unexpected calcification. Skull: Normal. Negative for fracture or focal lesion. Sinuses/Orbits: No acute finding. Other: None. IMPRESSION: Multiple calcifications of right cerebellum with low-density in changes of edema involving the bilateral cerebellum. Metastatic disease is suspected. Further evaluation with MRI of the brain is recommended. Electronically Signed: By: Abelardo Diesel M.D. On: 03/07/2017 15:32   Ct Chest W  Contrast  Result Date: 03/09/2017 CLINICAL DATA:  Restaging workup of metastatic breast cancer with intracranial metastatic disease. EXAM: CT CHEST, ABDOMEN, AND PELVIS WITH CONTRAST TECHNIQUE: Multidetector CT imaging of the chest, abdomen and pelvis was performed following the standard protocol during bolus administration of intravenous contrast. CONTRAST:  9m ISOVUE-300 IOPAMIDOL (ISOVUE-300) INJECTION 61%, 1072mISOVUE-300 IOPAMIDOL (ISOVUE-300) INJECTION 61% COMPARISON:  Multiple exams, including 10/18/2016 FINDINGS: CT CHEST FINDINGS Cardiovascular: Left Port-A-Cath tip: Lower SVC. Coronary, aortic arch, and branch vessel atherosclerotic vascular disease. Mediastinum/Nodes: Indistinctly marginated right axial nodularity with short axis diameter of 0.8 cm, stable. This is likely a small axillary lymph node. Lungs/Pleura: Trace bilateral pleural effusions. Centrilobular emphysema. Mild scarring in the right middle lobe and lingula. Musculoskeletal: Chronic mild anterior wedging of the T11 vertebral body. Cutaneous thickening  and subcutaneous stranding in the right breast similar to the prior exam. CT ABDOMEN PELVIS FINDINGS Hepatobiliary: Mild fatty infiltration of the liver with some sparing along the gallbladder fossa. No focal hepatic mass identified. Gallbladder unremarkable. Pancreas: Unremarkable Spleen: Unremarkable Adrenals/Urinary Tract: Stable fullness of both adrenal glands without a well-defined mass. Stable exophytic homogeneous hypodense 2.4 cm fluid density lesion from the right mid kidney favoring cyst. Nonspecific hypodense 8 mm lesion of the left kidney lower pole is technically nonspecific due to small size. Similarly there is a nonspecific 6 mm lesion of the right kidney lower pole. Bilateral nonobstructive nephrolithiasis. The largest of the 6 right renal calculi is 5 mm in long axis on image 115/5. The largest of the 7 left renal calculi is 6 mm in long axis on image 84/5. No  hydronephrosis, hydroureter, or ureteral/bladder calculus. Stomach/Bowel: Sigmoid colon diverticulosis without active diverticulitis. Low position of the anorectal junction compatible with pelvic floor laxity. Vascular/Lymphatic: Aortoiliac atherosclerotic vascular disease. No pathologic adenopathy identified. Reproductive: Enhancing lesion along the posterior margin of the uterus measuring 2.1 by 1.3 cm today, formerly the same by my measurement. Looking back further this measured 1.6 by 1.3 cm on 03/10/2015. This lesion was mildly hypermetabolic on 13/24/4010. Adnexa unremarkable. Other: No supplemental non-categorized findings. Musculoskeletal: Grade 1 degenerative anterolisthesis at L4-5 with associated degenerative disc disease. No compelling findings of osseous metastatic disease. Umbilical hernia contains adipose tissue. IMPRESSION: 1. No convincing findings for metastatic disease in the chest, abdomen, or pelvis. There is some continued cutaneous thickening and subcutaneous stranding in the right breast much of which may be due to prior therapy. 2. A small posterior uterine mass has mildly enlarged over the last 3 years, and was previously mildly hypermetabolic (although metabolic activity does not differentiate benign from malignant myometrial masses). Given the very slow degree of enlargement is most likely that this is indeed a benign fibroid with some postmenopausal enlargement. Attention directed at this lesion on follow up studies is suggested to ensure lack of accelerated growth. 3. Other imaging findings of potential clinical significance: Aortic Atherosclerosis (ICD10-I70.0) and Emphysema (ICD10-J43.9). Coronary atherosclerosis. Trace bilateral pleural effusions. Hepatic steatosis. Stable fullness of both adrenal glands. Bilateral nonobstructive nephrolithiasis. Sigmoid diverticulosis. Pelvic floor laxity. Lumbar degenerative disc disease. Umbilical hernia containing adipose tissue. Electronically  Signed   By: Van Clines M.D.   On: 03/09/2017 07:08   Mr Brain W And Wo Contrast  Result Date: 03/07/2017 CLINICAL DATA:  67 y/o F; right cerebellar lesion suspected metastatic disease, initial workup. History of metastatic breast cancer. EXAM: MRI HEAD WITHOUT AND WITH CONTRAST TECHNIQUE: Multiplanar, multiecho pulse sequences of the brain and surrounding structures were obtained without and with intravenous contrast. CONTRAST:  48m MULTIHANCE GADOBENATE DIMEGLUMINE 529 MG/ML IV SOLN COMPARISON:  03/07/2017 CT head and 12/09/2014 PET-CT FINDINGS: Brain: Heterogeneous irregular marginated mass within the right superior cerebellum measuring 4.8 x 6.4 x 3.2 cm (AP x ML x CC series 11, image 17 and series 13, image 7). Mass demonstrates speckled T2 hypointense signal with susceptibility blooming corresponding to mineralization on CT. There are numerous tortuous blood vessel surrounding the mass. Surrounding edema extends into the right pons and left cerebellar hemisphere. Mass effect partially effaces the fourth ventricle and right aspect of quadrigeminal plate cistern. Mild brain parenchymal volume loss. Ventriculomegaly is borderline for the degree of volume loss. There is a background of moderate chronic microvascular ischemic changes of the brain and multiple foci of susceptibility hypointensity compatible with microhemorrhage. 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 2 mm focus of enhancement in right occipital lobe (series 11, image 30). Vascular: Patent central flow voids. Skull and upper cervical spine: Normal marrow signal. Sinuses/Orbits: Negative. Other: None. IMPRESSION: 1. Heterogeneous mass measuring up to 6.4 cm in the right superior cerebellum with surrounding abnormal vascularity. No appreciable lesion was present on the 2016 PET-CT noncontrast portion. 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 Associated edema and mass effect  partially effaces fourth ventricle. 2. Lateral and third ventriculomegaly is borderline for degree of volume loss and early hydrocephalus is possible. 3. Additional 3 mm enhancing focus in right occipital lobe may represent metastasis. Electronically Signed   By: Kristine Garbe M.D.   On: 03/07/2017 18:57   US Abdomen Complete  Result Date: 02/15/2017 CLINICAL DATA:  Nausea. EXAM: ABDOMEN ULTRASOUND COMPLETE COMPARISON:  CT scan of October 18, 2016. FINDINGS: Gallbladder: No gallstones or wall thickening visualized. No sonographic Murphy sign noted by sonographer. Common bile duct: Diameter: 3.8 mm which is within normal limits. Liver: No focal lesion identified. Increased echogenicity of hepatic parenchyma is noted suggesting fatty infiltration. Portal vein is patent on color Doppler imaging with normal direction of blood flow towards the liver. IVC: No abnormality visualized. Pancreas: Visualized portion unremarkable. Spleen: Size and appearance within normal limits. Right Kidney: Length: 10.9 cm. 2.6 cm cyst is noted in midpole. 8 mm nonobstructive calculus is noted. Echogenicity within normal limits. No mass or hydronephrosis visualized. Left Kidney: Length: 10.8 cm. 6 mm calculus is noted. Echogenicity within normal limits. No mass or hydronephrosis visualized. Abdominal aorta: No aneurysm visualized. Other findings: None. IMPRESSION: Bilateral nonobstructive nephrolithiasis. Increased echogenicity of hepatic parenchyma is noted suggesting fatty infiltration. Electronically Signed   By: Marijo Conception, M.D.   On: 02/15/2017 15:22   Ct Abdomen Pelvis W Contrast  Result Date: 03/09/2017 CLINICAL DATA:  Restaging workup of metastatic breast cancer with intracranial metastatic disease. EXAM: CT CHEST, ABDOMEN, AND PELVIS WITH CONTRAST TECHNIQUE: Multidetector CT imaging of the chest, abdomen and pelvis was performed following the standard protocol during bolus administration of intravenous  contrast. CONTRAST:  36m ISOVUE-300 IOPAMIDOL (ISOVUE-300) INJECTION 61%, 1094mISOVUE-300 IOPAMIDOL (ISOVUE-300) INJECTION 61% COMPARISON:  Multiple exams, including 10/18/2016 FINDINGS: CT CHEST FINDINGS Cardiovascular: Left Port-A-Cath tip: Lower SVC. Coronary, aortic arch, and branch vessel atherosclerotic vascular disease. Mediastinum/Nodes: Indistinctly marginated right axial nodularity with short axis diameter of 0.8 cm, stable. This is likely a small axillary lymph node. Lungs/Pleura: Trace bilateral pleural effusions. Centrilobular emphysema. Mild scarring in the right middle lobe and lingula. Musculoskeletal: Chronic mild anterior wedging of the T11 vertebral body. Cutaneous thickening and subcutaneous stranding in the right breast similar to the prior exam. CT ABDOMEN PELVIS FINDINGS Hepatobiliary: Mild fatty infiltration of the liver with some sparing along the gallbladder fossa. No focal hepatic mass identified. Gallbladder unremarkable. Pancreas: Unremarkable Spleen: Unremarkable Adrenals/Urinary Tract: Stable fullness of both adrenal glands without a well-defined mass. Stable exophytic homogeneous hypodense 2.4 cm fluid density lesion from the right mid kidney favoring cyst. Nonspecific hypodense 8 mm lesion of the left kidney lower pole is technically nonspecific due to small size. Similarly there is a nonspecific 6 mm lesion of the right kidney lower pole. Bilateral nonobstructive nephrolithiasis. The largest of the 6 right renal calculi is 5 mm in long axis on image 115/5. The largest of the 7 left renal calculi is 6 mm in long axis on image 84/5. No hydronephrosis, hydroureter, or ureteral/bladder calculus. Stomach/Bowel:  Sigmoid colon diverticulosis without active diverticulitis. Low position of the anorectal junction compatible with pelvic floor laxity. Vascular/Lymphatic: Aortoiliac atherosclerotic vascular disease. No pathologic adenopathy identified. Reproductive: Enhancing lesion along the  posterior margin of the uterus measuring 2.1 by 1.3 cm today, formerly the same by my measurement. Looking back further this measured 1.6 by 1.3 cm on 03/10/2015. This lesion was mildly hypermetabolic on 69/67/8938. Adnexa unremarkable. Other: No supplemental non-categorized findings. Musculoskeletal: Grade 1 degenerative anterolisthesis at L4-5 with associated degenerative disc disease. No compelling findings of osseous metastatic disease. Umbilical hernia contains adipose tissue. IMPRESSION: 1. No convincing findings for metastatic disease in the chest, abdomen, or pelvis. There is some continued cutaneous thickening and subcutaneous stranding in the right breast much of which may be due to prior therapy. 2. A small posterior uterine mass has mildly enlarged over the last 3 years, and was previously mildly hypermetabolic (although metabolic activity does not differentiate benign from malignant myometrial masses). Given the very slow degree of enlargement is most likely that this is indeed a benign fibroid with some postmenopausal enlargement. Attention directed at this lesion on follow up studies is suggested to ensure lack of accelerated growth. 3. Other imaging findings of potential clinical significance: Aortic Atherosclerosis (ICD10-I70.0) and Emphysema (ICD10-J43.9). Coronary atherosclerosis. Trace bilateral pleural effusions. Hepatic steatosis. Stable fullness of both adrenal glands. Bilateral nonobstructive nephrolithiasis. Sigmoid diverticulosis. Pelvic floor laxity. Lumbar degenerative disc disease. Umbilical hernia containing adipose tissue. Electronically Signed   By: Van Clines M.D.   On: 03/09/2017 07:08     CBC Recent Labs  Lab 03/07/17 1336 03/08/17 0347  WBC 8.5 4.7  HGB 14.5 11.6*  HCT 44.2 34.6*  PLT 206 157  MCV 94.6 94.0  MCH 31.0 31.5  MCHC 32.8 33.5  RDW 15.3 15.4  LYMPHSABS 1.5  --   MONOABS 0.4  --   EOSABS 0.0  --   BASOSABS 0.0  --     Chemistries  Recent  Labs  Lab 03/07/17 1336 03/08/17 0347  NA 142 141  K 3.9 3.8  CL 104 114*  CO2 25 19*  GLUCOSE 191* 176*  BUN 9 10  CREATININE 1.12* 0.65  CALCIUM 9.5 7.8*   ------------------------------------------------------------------------------------------------------------------ No results for input(s): CHOL, HDL, LDLCALC, TRIG, CHOLHDL, LDLDIRECT in the last 72 hours.  No results found for: HGBA1C ------------------------------------------------------------------------------------------------------------------ No results for input(s): TSH, T4TOTAL, T3FREE, THYROIDAB in the last 72 hours.  Invalid input(s): FREET3 ------------------------------------------------------------------------------------------------------------------ No results for input(s): VITAMINB12, FOLATE, FERRITIN, TIBC, IRON, RETICCTPCT in the last 72 hours.  Coagulation profile No results for input(s): INR, PROTIME in the last 168 hours.  No results for input(s): DDIMER in the last 72 hours.  Cardiac Enzymes No results for input(s): CKMB, TROPONINI, MYOGLOBIN in the last 168 hours.  Invalid input(s): CK ------------------------------------------------------------------------------------------------------------------ No results found for: BNP  [facility] for urgical debulking/resection of brain tumor on 03/13/17 at [facility].    Roxan Hockey M.D on 03/10/2017 at 5:20 PM  Between 7am to 7pm - Pager - [phone]  After 7pm go to [email or web address] - password [email or web address]  [facility] -  Office  [phone]   Voice Recognition Viviann Spare dictation system was used to create this note, attempts have been made to correct errors. Please contact the author with questions and/or clarifications.

## 2017-03-10 NOTE — Progress Notes (Addendum)
Physical Therapy Treatment Patient Details Name: Denise Morton MRN: [id number] DOB: 11/05/1950 Today's Date: 03/10/2017    History of Present Illness 67 y.o. Female with history of breast cancer, DM, gastritis, presents with weakness. Patient stated that for the past few months she has had decreased appetite, nausea, and weakness.  Pt had hypotensive episode at MD office and was sent to hospital.  MRI (+) metastatic cerebellar lesion(s).     PT Comments    Improved performance compared to last session. Overall, Min guard assist for session. Pt stated she is supposed to xfer to [facility] by Sunday for surgical procedure on Monday. Will continue to follow and update recommendations as necessary.     Follow Up Recommendations  SNF(will have to assess after surgical procedure)     Equipment Recommendations  Rolling walker with 5" wheels    Recommendations for Other Services       Precautions / Restrictions Precautions Precautions: Fall Restrictions Weight Bearing Restrictions: No    Mobility  Bed Mobility Overal bed mobility: Modified Independent                Transfers Overall transfer level: Needs assistance Equipment used: Rolling walker (2 wheeled) Transfers: Sit to/from Stand Sit to Stand: Min guard         General transfer comment: multiple attempts to get to standing. Cues for hand placement. close guard for safety.   Ambulation/Gait Ambulation/Gait assistance: Min guard Ambulation Distance (Feet): 125 Feet Assistive device: Rolling walker (2 wheeled) Gait Pattern/deviations: Step-through pattern;Decreased stride length     General Gait Details: close guard for safety. cues for pacing. Pt c/o fatigue after walk.    Stairs            Wheelchair Mobility    Modified Rankin (Stroke Patients Only)       Balance                                            Cognition Arousal/Alertness: Awake/alert Behavior During Therapy: WFL  for tasks assessed/performed Overall Cognitive Status: Within Functional Limits for tasks assessed                                        Exercises General Exercises - Lower Extremity Straight Leg Raises: AROM;Both;10 reps;Seated Toe Raises: AROM;Both;10 reps;Seated Heel Raises: AROM;Both;10 reps;Seated    General Comments        Pertinent Vitals/Pain Pain Assessment: Faces Faces Pain Scale: Hurts little more Pain Location: head Pain Descriptors / Indicators: Aching Pain Intervention(s): Monitored during session    Home Living                      Prior Function            PT Goals (current goals can now be found in the care plan section) Progress towards PT goals: Progressing toward goals    Frequency    Min 3X/week      PT Plan Current plan remains appropriate    Co-evaluation              AM-PAC PT "6 Clicks" Daily Activity  Outcome Measure  Difficulty turning over in bed (including adjusting bedclothes, sheets and blankets)?: None Difficulty moving from lying on back to sitting on the  side of the bed? : None Difficulty sitting down on and standing up from a chair with arms (e.g., wheelchair, bedside commode, etc,.)?: A Little Help needed moving to and from a bed to chair (including a wheelchair)?: A Little Help needed walking in hospital room?: A Little Help needed climbing 3-5 steps with a railing? : A Lot 6 Click Score: 19    End of Session Equipment Utilized During Treatment: Gait belt Activity Tolerance: Patient tolerated treatment well Patient left: in bed;with call bell/phone within reach   PT Visit Diagnosis: Unsteadiness on feet (R26.81);Muscle weakness (generalized) (M62.81);Difficulty in walking, not elsewhere classified (R26.2)     Time: 3754-3606 PT Time Calculation (min) (ACUTE ONLY): 12 min  Charges:  $Gait Training: 8-22 mins                    G Codes:         Weston Anna, MPT Pager:  [phone]

## 2017-03-10 NOTE — Care Management Important Message (Signed)
Important Message  Patient Details  Name: Denise Morton MRN: [id number] Date of Birth: [birth year]   Medicare Important Message Given:  Yes    Kerin Salen 03/10/2017, 12:40 [place] Message  Patient Details  Name: Denise Morton MRN: [id number] Date of Birth: Aug 17, 1950   Medicare Important Message Given:  Yes    Kerin Salen 03/10/2017, 12:36 PM

## 2017-03-11 LAB — GLUCOSE, CAPILLARY
GLUCOSE-CAPILLARY: 197 mg/dL — AB (ref 65–99)
Glucose-Capillary: 194 mg/dL — ABNORMAL HIGH (ref 65–99)
Glucose-Capillary: 229 mg/dL — ABNORMAL HIGH (ref 65–99)
Glucose-Capillary: 283 mg/dL — ABNORMAL HIGH (ref 65–99)

## 2017-03-11 LAB — CBC
HEMATOCRIT: 43.7 % (ref 36.0–46.0)
Hemoglobin: 14.6 g/dL (ref 12.0–15.0)
MCH: 30.8 pg (ref 26.0–34.0)
MCHC: 33.4 g/dL (ref 30.0–36.0)
MCV: 92.2 fL (ref 78.0–100.0)
Platelets: 182 10*3/uL (ref 150–400)
RBC: 4.74 MIL/uL (ref 3.87–5.11)
RDW: 15.4 % (ref 11.5–15.5)
WBC: 7.2 10*3/uL (ref 4.0–10.5)

## 2017-03-11 LAB — MRSA PCR SCREENING: MRSA by PCR: NEGATIVE

## 2017-03-11 LAB — SURGICAL PCR SCREEN
MRSA, PCR: NEGATIVE
Staphylococcus aureus: POSITIVE — AB

## 2017-03-11 MED ORDER — MUPIROCIN 2 % EX OINT
1.0000 "application " | TOPICAL_OINTMENT | Freq: Two times a day (BID) | CUTANEOUS | Status: AC
  Administered 2017-03-11 – 2017-03-16 (×9): 1 via NASAL
  Filled 2017-03-11 (×3): qty 22

## 2017-03-11 MED ORDER — INSULIN ASPART 100 UNIT/ML ~~LOC~~ SOLN
0.0000 [IU] | Freq: Three times a day (TID) | SUBCUTANEOUS | Status: DC
  Administered 2017-03-11: 3 [IU] via SUBCUTANEOUS
  Administered 2017-03-12: 2 [IU] via SUBCUTANEOUS
  Administered 2017-03-12 (×2): 5 [IU] via SUBCUTANEOUS

## 2017-03-11 MED ORDER — CHLORHEXIDINE GLUCONATE CLOTH 2 % EX PADS
6.0000 | MEDICATED_PAD | Freq: Every day | CUTANEOUS | Status: DC
  Administered 2017-03-11 – 2017-03-13 (×3): 6 via TOPICAL

## 2017-03-11 MED ORDER — DEXAMETHASONE 4 MG PO TABS
4.0000 mg | ORAL_TABLET | Freq: Two times a day (BID) | ORAL | Status: DC
  Administered 2017-03-11 – 2017-03-14 (×5): 4 mg via ORAL
  Filled 2017-03-11 (×5): qty 1

## 2017-03-11 NOTE — Progress Notes (Signed)
1900: Handoff report received from RN. Pt resting in bed. Discussed plan of care for the shift; pt amenable to plan.  0000: Pt resting comfortably.  0400: Pt continues resting comfortably.  0700: Handoff report given to RN. No acute events overnight.  

## 2017-03-11 NOTE — Progress Notes (Addendum)
Patient Demographics:    Denise Morton, is a 67 y.o. female, DOB - 02-02-1950, TWK:[id number]  Admit date - 03/07/2017   Admitting Physician Elwin Mocha, MD  Outpatient Primary MD for the patient is Seward Carol, MD  LOS - 4   Chief Complaint  Patient presents with  . Weakness        Subjective:    [name] today has no fevers, no emesis,  No chest pain, no severe headaches, ambulating much better, more coherent  Assessment  & Plan :    Active Problems:   Syncope  MRI Brain 03/07/17- IMPRESSION: 1. Heterogeneous mass measuring up to 6.4 cm in the right superior cerebellum with surrounding abnormal vascularity. No appreciable lesion was present on the 2016 PET-CT noncontrast portion. 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 Associated edema and mass effect partially effaces fourth ventricle. 2. Lateral and third ventriculomegaly is borderline for degree of volume loss and early hydrocephalus is possible. 3. Additional 3 mm enhancing focus in right occipital lobe may represent metastasis.  Brief Summary:- Denise Morton is a 67 y.o. female  With pmhx of BRCA, DM, admitted on 03/07/17 with weakness, dizziness, anorexia , speech concerns and near syncope .  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 Transfer from [facility] to [facility] for Neurosurgical debulking/resection of brain tumor on 03/13/17 at [place]:- 1)Brain Mets-   As per  Dr. Saintclair Halsted (neurosurgery)  plan is to proceed with surgical debulking/resection of brain tumor on 03/13/17 at [facility].  anticipate postop SRS and Dr. Lisbeth Renshaw would likely consider fractionating her treatment. Her case will be presented in brain/spine oncology conference next week , oncology consult from Dr. Lindi Adie appreciated, continue Decadron,   2)HTN-stage II, stable, c/n  amlodipine 10 mg  daily, Coreg 12.5 mg twice daily,  may use IV Hydralazine 10 mg  Every 4 hours Prn for systolic blood pressure over 160 mmhg or iv Labetalol prn  4)DM-  anticipate worsening hyperglycemia due to steroids/Decadron, however Pt will be NPO for Brain Surgery on 03/13/17.Marland Kitchen So Allow some permissive Hyperglycemia rather than risk life-threatening hypoglycemia in a patient with unreliable oral intake. Use Novolog/Humalog Sliding scale insulin with Accu-Cheks/Fingersticks as ordered  5)Anorexia and Insomnia-  c/n Remeron 15 mg qhs    Code Status : Full code  Disposition Plan  : [facility] for Neurosurgical debulking/resection of brain tumor on 03/13/17 at [place]  :  Oncology/Speech/PT  DVT Prophylaxis  :  Lovenox (will  Need to be held after 03/11/17 to allow for surgery on 03/13/17)  Lab Results  Component Value Date   PLT 182 03/11/2017    Inpatient Medications  Scheduled Meds: . amLODipine  10 mg Oral Daily  . carvedilol  12.5 mg Oral BID WC  . chlorhexidine  15 mL Mouth Rinse BID  . dexamethasone  4 mg Oral Q12H  . enoxaparin (LOVENOX) injection  40 mg Subcutaneous Q24H  . feeding supplement (ENSURE ENLIVE)  237 mL Oral BID BM  . insulin aspart  0-9 Units Subcutaneous TID WC  . mouth rinse  15 mL Mouth Rinse q12n4p  . mirtazapine  15  mg Oral QHS  . nystatin-triamcinolone   Topical BID  . pantoprazole  40 mg Oral Daily   Continuous Infusions: PRN Meds:.acetaminophen **OR** acetaminophen, hydrALAZINE, labetalol, LORazepam, ondansetron **OR** ondansetron (ZOFRAN) IV, oxyCODONE, prochlorperazine   Anti-infectives (From admission, onward)   None        Objective:   Vitals:   03/11/17 0506 03/11/17 0606 03/11/17 0833 03/11/17 0900  BP: (!) 204/105 (!) 163/93 136/87 128/65  Pulse: 66 71 73 78  Resp: 18   20  Temp: 97.8 F (36.6 C)   98.8 F (37.1 C)  TempSrc: Oral   Oral  SpO2: 95%   97%  Weight: 98.5 kg (217 lb 2.5 oz)     Height:        Wt  Readings from Last 3 Encounters:  03/11/17 98.5 kg (217 lb 2.5 oz)  02/08/17 90 kg (198 lb 8 oz)  01/11/17 103 kg (227 lb 1.6 oz)    Intake/Output Summary (Last 24 hours) at 03/11/2017 1550 Last data filed at 03/11/2017 0844 Gross per 24 hour  Intake 240 ml  Output -  Net 240 ml    Physical Exam  Gen:- Awake Alert, talkactive today HEENT:- [place].AT, No sclera icterus Neck-Supple Neck,No JVD,.  Lungs-  CTAB , left subclavian Port-A-Cath/Mediport site is clean dry and intact CV- S1, S2 normal Abd-  +ve B.Sounds, Abd Soft, No tenderness,    Extremity/Skin:- No  edema,    Psych-affect is appropriate Neuro-no new focal deficits, gait is more steady   Data Review:   Micro Results Recent Results (from the past 240 hour(s))  Surgical PCR screen     Status: Abnormal   Collection Time: 03/11/17 11:03 AM  Result Value Ref Range Status   MRSA, PCR NEGATIVE NEGATIVE Final   Staphylococcus aureus POSITIVE (A) NEGATIVE Final    Comment: (NOTE) The Xpert SA Assay (FDA approved for NASAL specimens in patients 72 years of age and older), is one component of a comprehensive surveillance program. It is not intended to diagnose infection nor to guide or monitor treatment. Performed at [facility], [place] [address], [place], [place] [address]    Radiology Reports Ct Head Wo Contrast  Addendum Date: 03/07/2017   ADDENDUM REPORT: 03/07/2017 15:42 ADDENDUM: These results were called by telephone at the time of interpretation on 03/07/2017 at 3:42 pm to Dr. Deno Etienne , who verbally acknowledged these results. Electronically Signed   By: Abelardo Diesel M.D.   On: 03/07/2017 15:42   Result Date: 03/07/2017 CLINICAL DATA:  Slurred speech today at doctor's office. History of breast cancer. EXAM: CT HEAD WITHOUT CONTRAST TECHNIQUE: Contiguous axial images were obtained from the base of the skull through the vertex without intravenous contrast. COMPARISON:  None. FINDINGS: Brain: There  are multiple calcifications of the right cerebellum with low-density and changes of edema involving the bilateral cerebellum. There is bilateral periventricular white matter small vessel ischemic change. There is no midline shift or hydrocephalus. No acute hemorrhage is identified. Vascular: No hyperdense vessel or unexpected calcification. Skull: Normal. Negative for fracture or focal lesion. Sinuses/Orbits: No acute finding. Other: None. IMPRESSION: Multiple calcifications of right cerebellum with low-density in changes of edema involving the bilateral cerebellum. Metastatic disease is suspected. Further evaluation with MRI of the brain is recommended. Electronically Signed: By: Abelardo Diesel M.D. On: 03/07/2017 15:32   Ct Chest W Contrast  Result Date: 03/09/2017 CLINICAL DATA:  Restaging workup of metastatic breast cancer with intracranial metastatic disease. EXAM: CT  CHEST, ABDOMEN, AND PELVIS WITH CONTRAST TECHNIQUE: Multidetector CT imaging of the chest, abdomen and pelvis was performed following the standard protocol during bolus administration of intravenous contrast. CONTRAST:  100m ISOVUE-300 IOPAMIDOL (ISOVUE-300) INJECTION 61%, 1025mISOVUE-300 IOPAMIDOL (ISOVUE-300) INJECTION 61% COMPARISON:  Multiple exams, including 10/18/2016 FINDINGS: CT CHEST FINDINGS Cardiovascular: Left Port-A-Cath tip: Lower SVC. Coronary, aortic arch, and branch vessel atherosclerotic vascular disease. Mediastinum/Nodes: Indistinctly marginated right axial nodularity with short axis diameter of 0.8 cm, stable. This is likely a small axillary lymph node. Lungs/Pleura: Trace bilateral pleural effusions. Centrilobular emphysema. Mild scarring in the right middle lobe and lingula. Musculoskeletal: Chronic mild anterior wedging of the T11 vertebral body. Cutaneous thickening and subcutaneous stranding in the right breast similar to the prior exam. CT ABDOMEN PELVIS FINDINGS Hepatobiliary: Mild fatty infiltration of the liver  with some sparing along the gallbladder fossa. No focal hepatic mass identified. Gallbladder unremarkable. Pancreas: Unremarkable Spleen: Unremarkable Adrenals/Urinary Tract: Stable fullness of both adrenal glands without a well-defined mass. Stable exophytic homogeneous hypodense 2.4 cm fluid density lesion from the right mid kidney favoring cyst. Nonspecific hypodense 8 mm lesion of the left kidney lower pole is technically nonspecific due to small size. Similarly there is a nonspecific 6 mm lesion of the right kidney lower pole. Bilateral nonobstructive nephrolithiasis. The largest of the 6 right renal calculi is 5 mm in long axis on image 115/5. The largest of the 7 left renal calculi is 6 mm in long axis on image 84/5. No hydronephrosis, hydroureter, or ureteral/bladder calculus. Stomach/Bowel: Sigmoid colon diverticulosis without active diverticulitis. Low position of the anorectal junction compatible with pelvic floor laxity. Vascular/Lymphatic: Aortoiliac atherosclerotic vascular disease. No pathologic adenopathy identified. Reproductive: Enhancing lesion along the posterior margin of the uterus measuring 2.1 by 1.3 cm today, formerly the same by my measurement. Looking back further this measured 1.6 by 1.3 cm on 03/10/2015. This lesion was mildly hypermetabolic on 1173/41/9379Adnexa unremarkable. Other: No supplemental non-categorized findings. Musculoskeletal: Grade 1 degenerative anterolisthesis at L4-5 with associated degenerative disc disease. No compelling findings of osseous metastatic disease. Umbilical hernia contains adipose tissue. IMPRESSION: 1. No convincing findings for metastatic disease in the chest, abdomen, or pelvis. There is some continued cutaneous thickening and subcutaneous stranding in the right breast much of which may be due to prior therapy. 2. A small posterior uterine mass has mildly enlarged over the last 3 years, and was previously mildly hypermetabolic (although metabolic  activity does not differentiate benign from malignant myometrial masses). Given the very slow degree of enlargement is most likely that this is indeed a benign fibroid with some postmenopausal enlargement. Attention directed at this lesion on follow up studies is suggested to ensure lack of accelerated growth. 3. Other imaging findings of potential clinical significance: Aortic Atherosclerosis (ICD10-I70.0) and Emphysema (ICD10-J43.9). Coronary atherosclerosis. Trace bilateral pleural effusions. Hepatic steatosis. Stable fullness of both adrenal glands. Bilateral nonobstructive nephrolithiasis. Sigmoid diverticulosis. Pelvic floor laxity. Lumbar degenerative disc disease. Umbilical hernia containing adipose tissue. Electronically Signed   By: WaVan Clines.D.   On: 03/09/2017 07:08   Mr Brain W And Wo Contrast  Result Date: 03/07/2017 CLINICAL DATA:  [age]/o F; right cerebellar lesion suspected metastatic disease, initial workup. History of metastatic breast cancer. EXAM: MRI HEAD WITHOUT AND WITH CONTRAST TECHNIQUE: Multiplanar, multiecho pulse sequences of the brain and surrounding structures were obtained without and with intravenous contrast. CONTRAST:  1824mULTIHANCE GADOBENATE DIMEGLUMINE 529 MG/ML IV SOLN COMPARISON:  03/07/2017 CT head and 12/09/2014 PET-CT FINDINGS: Brain:  Heterogeneous irregular marginated mass within the right superior cerebellum measuring 4.8 x 6.4 x 3.2 cm (AP x ML x CC series 11, image 17 and series 13, image 7). Mass demonstrates speckled T2 hypointense signal with susceptibility blooming corresponding to mineralization on CT. There are numerous tortuous blood vessel surrounding the mass. Surrounding edema extends into the right pons and left cerebellar hemisphere. Mass effect partially effaces the fourth ventricle and right aspect of quadrigeminal plate cistern. Mild brain parenchymal volume loss. Ventriculomegaly is borderline for the degree of volume loss. There is a  background of moderate chronic microvascular ischemic changes of the brain and multiple foci of susceptibility hypointensity compatible with microhemorrhage. 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 2 mm focus of enhancement in right occipital lobe (series 11, image 30). Vascular: Patent central flow voids. Skull and upper cervical spine: Normal marrow signal. Sinuses/Orbits: Negative. Other: None. IMPRESSION: 1. Heterogeneous mass measuring up to 6.4 cm in the right superior cerebellum with surrounding abnormal vascularity. No appreciable lesion was present on the 2016 PET-CT noncontrast portion. 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 Associated edema and mass effect partially effaces fourth ventricle. 2. Lateral and third ventriculomegaly is borderline for degree of volume loss and early hydrocephalus is possible. 3. Additional 3 mm enhancing focus in right occipital lobe may represent metastasis. Electronically Signed   By: Kristine Garbe M.D.   On: 03/07/2017 18:57   US Abdomen Complete  Result Date: 02/15/2017 CLINICAL DATA:  Nausea. EXAM: ABDOMEN ULTRASOUND COMPLETE COMPARISON:  CT scan of October 18, 2016. FINDINGS: Gallbladder: No gallstones or wall thickening visualized. No sonographic Murphy sign noted by sonographer. Common bile duct: Diameter: 3.8 mm which is within normal limits. Liver: No focal lesion identified. Increased echogenicity of hepatic parenchyma is noted suggesting fatty infiltration. Portal vein is patent on color Doppler imaging with normal direction of blood flow towards the liver. IVC: No abnormality visualized. Pancreas: Visualized portion unremarkable. Spleen: Size and appearance within normal limits. Right Kidney: Length: 10.9 cm. 2.6 cm cyst is noted in midpole. 8 mm nonobstructive calculus is noted. Echogenicity within normal limits. No mass or hydronephrosis visualized. Left Kidney: Length: 10.8 cm. 6  mm calculus is noted. Echogenicity within normal limits. No mass or hydronephrosis visualized. Abdominal aorta: No aneurysm visualized. Other findings: None. IMPRESSION: Bilateral nonobstructive nephrolithiasis. Increased echogenicity of hepatic parenchyma is noted suggesting fatty infiltration. Electronically Signed   By: Marijo Conception, M.D.   On: 02/15/2017 15:22   Ct Abdomen Pelvis W Contrast  Result Date: 03/09/2017 CLINICAL DATA:  Restaging workup of metastatic breast cancer with intracranial metastatic disease. EXAM: CT CHEST, ABDOMEN, AND PELVIS WITH CONTRAST TECHNIQUE: Multidetector CT imaging of the chest, abdomen and pelvis was performed following the standard protocol during bolus administration of intravenous contrast. CONTRAST:  14m ISOVUE-300 IOPAMIDOL (ISOVUE-300) INJECTION 61%, 1061mISOVUE-300 IOPAMIDOL (ISOVUE-300) INJECTION 61% COMPARISON:  Multiple exams, including 10/18/2016 FINDINGS: CT CHEST FINDINGS Cardiovascular: Left Port-A-Cath tip: Lower SVC. Coronary, aortic arch, and branch vessel atherosclerotic vascular disease. Mediastinum/Nodes: Indistinctly marginated right axial nodularity with short axis diameter of 0.8 cm, stable. This is likely a small axillary lymph node. Lungs/Pleura: Trace bilateral pleural effusions. Centrilobular emphysema. Mild scarring in the right middle lobe and lingula. Musculoskeletal: Chronic mild anterior wedging of the T11 vertebral body. Cutaneous thickening and subcutaneous stranding in the right breast similar to the prior exam. CT ABDOMEN PELVIS FINDINGS Hepatobiliary: Mild fatty infiltration of the liver with some sparing along the  gallbladder fossa. No focal hepatic mass identified. Gallbladder unremarkable. Pancreas: Unremarkable Spleen: Unremarkable Adrenals/Urinary Tract: Stable fullness of both adrenal glands without a well-defined mass. Stable exophytic homogeneous hypodense 2.4 cm fluid density lesion from the right mid kidney favoring cyst.  Nonspecific hypodense 8 mm lesion of the left kidney lower pole is technically nonspecific due to small size. Similarly there is a nonspecific 6 mm lesion of the right kidney lower pole. Bilateral nonobstructive nephrolithiasis. The largest of the 6 right renal calculi is 5 mm in long axis on image 115/5. The largest of the 7 left renal calculi is 6 mm in long axis on image 84/5. No hydronephrosis, hydroureter, or ureteral/bladder calculus. Stomach/Bowel: Sigmoid colon diverticulosis without active diverticulitis. Low position of the anorectal junction compatible with pelvic floor laxity. Vascular/Lymphatic: Aortoiliac atherosclerotic vascular disease. No pathologic adenopathy identified. Reproductive: Enhancing lesion along the posterior margin of the uterus measuring 2.1 by 1.3 cm today, formerly the same by my measurement. Looking back further this measured 1.6 by 1.3 cm on 03/10/2015. This lesion was mildly hypermetabolic on 50/35/4656. Adnexa unremarkable. Other: No supplemental non-categorized findings. Musculoskeletal: Grade 1 degenerative anterolisthesis at L4-5 with associated degenerative disc disease. No compelling findings of osseous metastatic disease. Umbilical hernia contains adipose tissue. IMPRESSION: 1. No convincing findings for metastatic disease in the chest, abdomen, or pelvis. There is some continued cutaneous thickening and subcutaneous stranding in the right breast much of which may be due to prior therapy. 2. A small posterior uterine mass has mildly enlarged over the last 3 years, and was previously mildly hypermetabolic (although metabolic activity does not differentiate benign from malignant myometrial masses). Given the very slow degree of enlargement is most likely that this is indeed a benign fibroid with some postmenopausal enlargement. Attention directed at this lesion on follow up studies is suggested to ensure lack of accelerated growth. 3. Other imaging findings of potential  clinical significance: Aortic Atherosclerosis (ICD10-I70.0) and Emphysema (ICD10-J43.9). Coronary atherosclerosis. Trace bilateral pleural effusions. Hepatic steatosis. Stable fullness of both adrenal glands. Bilateral nonobstructive nephrolithiasis. Sigmoid diverticulosis. Pelvic floor laxity. Lumbar degenerative disc disease. Umbilical hernia containing adipose tissue. Electronically Signed   By: Van Clines M.D.   On: 03/09/2017 07:08     CBC Recent Labs  Lab 03/07/17 1336 03/08/17 0347 03/11/17 0620  WBC 8.5 4.7 7.2  HGB 14.5 11.6* 14.6  HCT 44.2 34.6* 43.7  PLT 206 157 182  MCV 94.6 94.0 92.2  MCH 31.0 31.5 30.8  MCHC 32.8 33.5 33.4  RDW 15.3 15.4 15.4  LYMPHSABS 1.5  --   --   MONOABS 0.4  --   --   EOSABS 0.0  --   --   BASOSABS 0.0  --   --     Chemistries  Recent Labs  Lab 03/07/17 1336 03/08/17 0347 03/10/17 1718  NA 142 141 135  K 3.9 3.8 5.2*  CL 104 114* 100*  CO2 25 19* 23  GLUCOSE 191* 176* 224*  BUN 9 10 22*  CREATININE 1.12* 0.65 0.79  CALCIUM 9.5 7.8* 9.4  AST  --   --  34  ALT  --   --  35  ALKPHOS  --   --  62  BILITOT  --   --  1.2   ------------------------------------------------------------------------------------------------------------------ No results for input(s): CHOL, HDL, LDLCALC, TRIG, CHOLHDL, LDLDIRECT in the last 72 hours.  No results found for: HGBA1C ------------------------------------------------------------------------------------------------------------------ No results for input(s): TSH, T4TOTAL, T3FREE, THYROIDAB in the last 72 hours.  Invalid input(s): FREET3 ------------------------------------------------------------------------------------------------------------------ No results for input(s): VITAMINB12, FOLATE, FERRITIN, TIBC, IRON, RETICCTPCT in the last 72 hours.  Coagulation profile No results for input(s): INR, PROTIME in the last 168 hours.  No results for input(s): DDIMER in the last 72  hours.  Cardiac Enzymes No results for input(s): CKMB, TROPONINI, MYOGLOBIN in the last 168 hours.  Invalid input(s): CK ------------------------------------------------------------------------------------------------------------------ No results found for: BNP  [facility] for urgical debulking/resection of brain tumor on 03/13/17 at [facility].    Roxan Hockey M.D on 03/11/2017 at 3:50 PM  Between 7am to 7pm - Pager - [phone]  After 7pm go to [email or web address] - password [email or web address]  [facility] -  Office  [phone]   Voice Recognition Viviann Spare dictation system was used to create this note, attempts have been made to correct errors. Please contact the author with questions and/or clarifications.

## 2017-03-11 NOTE — Progress Notes (Signed)
Patient arrived from [name] in preparation for OR on Monday per neurosurgery. Discussed with Dr. Denton Brick over the phone, progress note reviewed. Patient seen bedside, no complaints, stable.  Cris Gibby M. Cruzita Lederer, MD [facility] [phone]  If 7PM-7AM, please contact night-coverage [email or web address] Password [email or web address]

## 2017-03-11 NOTE — Progress Notes (Signed)
Report called to 4NP8, RN Crystal. PT AOx4, VSS. IV left forearm NSL. NSR on tele. No c/o pain. [facility] called for transport.  Kizzie Ide, RN

## 2017-03-12 LAB — GLUCOSE, CAPILLARY
GLUCOSE-CAPILLARY: 295 mg/dL — AB (ref 65–99)
GLUCOSE-CAPILLARY: 346 mg/dL — AB (ref 65–99)
Glucose-Capillary: 196 mg/dL — ABNORMAL HIGH (ref 65–99)
Glucose-Capillary: 248 mg/dL — ABNORMAL HIGH (ref 65–99)

## 2017-03-12 NOTE — Progress Notes (Signed)
Patient ID: Denise Morton, female   DOB: 12/26/1950, 67 y.o.   MRN: [id number] Subjective:  the patient is alert and pleasant. She is in no apparent distress.  Objective: Vital signs in last 24 hours: Temp:  [97.8 F (36.6 C)-98.3 F (36.8 C)] 98.3 F (36.8 C) (02/24 0830) Pulse Rate:  [64-80] 80 (02/24 0859) Resp:  [13-23] 15 (02/24 0500) BP: (151-186)/(78-107) 186/99 (02/24 0906) SpO2:  [94 %-100 %] 95 % (02/24 0500) Weight:  [96.5 kg (212 lb 11.9 oz)] 96.5 kg (212 lb 11.9 oz) (02/24 0500) Estimated body mass index is 33.32 kg/m as calculated from the following:   Height as of this encounter: 5\' 7"  (1.702 m).   Weight as of this encounter: 96.5 kg (212 lb 11.9 oz).   Intake/Output from previous day: 02/23 0701 - 02/24 0700 In: 240 [P.O.:240] Out: -  Intake/Output this shift: Total I/O In: 240 [P.O.:240] Out: -   Physical exam the patient is alert and oriented. She is moving all 4 extremities.  Lab Results: Recent Labs    03/11/17 0620  WBC 7.2  HGB 14.6  HCT 43.7  PLT 182   BMET Recent Labs    03/10/17 1718  NA 135  K 5.2*  CL 100*  CO2 23  GLUCOSE 224*  BUN 22*  CREATININE 0.79  CALCIUM 9.4    Studies/Results: No results found.  Assessment/Plan: Cerebellar mass: The patient is scheduled for surgery tomorrow. I've answered all her questions. I'll make her nothing by mouth. Her Lovenox has been discontinued.  LOS: 5 days     Ophelia Charter 03/12/2017, 10:10 AM

## 2017-03-12 NOTE — Progress Notes (Signed)
PROGRESS NOTE  Denise Morton YBO:[id number] DOB: 1950/06/25 DOA: 03/07/2017 PCP: Seward Carol, MD   LOS: 5 days   Brief Narrative / Interim history: 67 y.o.femaleWith pmhx of BRCA, DM, admitted on 03/07/17 with weakness, dizziness, anorexia speech concerns and near syncope.  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 Lindi Adie also involved -Continue Decadron  Hypertension -Closely monitor blood pressure, she is on amlodipine, Coreg, also on as needed hydralazine as well as labetalol  Diabetes mellitus -Continue sliding scale   DVT prophylaxis: SCDs Code Status: Full code Family Communication: No family at bedside Disposition Plan: To be determined  Consultants:   Oncology  Radiation oncology  Neurosurgery  Procedures:   None   Antimicrobials:  None    Subjective: -Feeling well this morning, no chest pain, no shortness of breath, no abdominal pain, nausea or vomiting  Objective: Vitals:   03/12/17 0859 03/12/17 0901 03/12/17 0906 03/12/17 1100  BP: (!) 186/107 (!) 186/107 (!) 186/99 (!) 142/72  Pulse: 80   83  Resp:    16  Temp:    98.6 F (37 C)  TempSrc:    Oral  SpO2:    97%  Weight:      Height:        Intake/Output Summary (Last 24 hours) at 03/12/2017 1300 Last data filed at 03/12/2017 0900 Gross per 24 hour  Intake 240 ml  Output -  Net 240 ml   Filed Weights   03/08/17 0429 03/11/17 0506 03/12/17 0500  Weight: 100.6 kg (221 lb 12.5 oz)  98.5 kg (217 lb 2.5 oz) 96.5 kg (212 lb 11.9 oz)    Examination:  Constitutional: NAD Eyes: lids and conjunctivae normal ENMT: Mucous membranes are moist. Neck: normal, supple Respiratory: clear to auscultation bilaterally, no wheezing, no crackles. Normal respiratory effort.  Cardiovascular: Regular rate and rhythm, no murmurs / rubs / gallops. No LE edema. 2+ pedal pulses. Abdomen: no tenderness. Bowel sounds positive.  Skin: no rash Neurologic: Equal strength, 5 out of 5 in all 4 extremities Psychiatric: Normal judgment and insight. Alert and oriented x 3. Normal mood.    Data Reviewed: I have independently reviewed following labs and imaging studies   CBC: Recent Labs  Lab 03/07/17 1336 03/08/17 0347 03/11/17 0620  WBC 8.5 4.7 7.2  NEUTROABS 6.6  --   --   HGB 14.5 11.6* 14.6  HCT 44.2 34.6* 43.7  MCV 94.6 94.0 92.2  PLT 206 157 277   Basic Metabolic Panel: Recent Labs  Lab 03/07/17 1336 03/08/17 0347 03/10/17 1718  NA 142 141 135  K 3.9 3.8 5.2*  CL 104 114* 100*  CO2 25 19* 23  GLUCOSE 191* 176* 224*  BUN 9 10 22*  CREATININE 1.12* 0.65 0.79  CALCIUM 9.5 7.8* 9.4   GFR: Estimated Creatinine Clearance: 82.6 mL/min (by C-G formula based on SCr of 0.79 mg/dL). Liver Function Tests: Recent Labs  Lab 03/10/17 1718  AST 34  ALT 35  ALKPHOS 62  BILITOT 1.2  PROT 6.6  ALBUMIN  3.3*   No results for input(s): LIPASE, AMYLASE in the last 168 hours. No results for input(s): AMMONIA in the last 168 hours. Coagulation Profile: No results for input(s): INR, PROTIME in the last 168 hours. Cardiac Enzymes: No results for input(s): CKTOTAL, CKMB, CKMBINDEX, TROPONINI in the last 168 hours. BNP (last 3 results) No results for input(s): PROBNP in the last 8760 hours. HbA1C: No results for input(s): HGBA1C in the last 72 hours. CBG: Recent Labs  Lab 03/11/17 1246 03/11/17 1642 03/11/17 2137 03/12/17 0826 03/12/17 1135  GLUCAP 194* 229* 283* 196* 295*    Lipid Profile: No results for input(s): CHOL, HDL, LDLCALC, TRIG, CHOLHDL, LDLDIRECT in the last 72 hours. Thyroid Function Tests: No results for input(s): TSH, T4TOTAL, FREET4, T3FREE, THYROIDAB in the last 72 hours. Anemia Panel: No results for input(s): VITAMINB12, FOLATE, FERRITIN, TIBC, IRON, RETICCTPCT in the last 72 hours. Urine analysis: No results found for: COLORURINE, APPEARANCEUR, LABSPEC, PHURINE, GLUCOSEU, HGBUR, BILIRUBINUR, KETONESUR, PROTEINUR, UROBILINOGEN, NITRITE, LEUKOCYTESUR Sepsis Labs: Invalid input(s): PROCALCITONIN, LACTICIDVEN  Recent Results (from the past 240 hour(s))  Surgical PCR screen     Status: Abnormal   Collection Time: 03/11/17 11:03 AM  Result Value Ref Range Status   MRSA, PCR NEGATIVE NEGATIVE Final   Staphylococcus aureus POSITIVE (A) NEGATIVE Final    Comment: (NOTE) The Xpert SA Assay (FDA approved for NASAL specimens in patients 35 years of age and older), is one component of a comprehensive surveillance program. It is not intended to diagnose infection nor to guide or monitor treatment. Performed at [facility], [place] [address], [place], [place] [address]   MRSA PCR Screening     Status: None   Collection Time: 03/11/17  5:10 PM  Result Value Ref Range Status   MRSA by PCR NEGATIVE NEGATIVE Final    Comment:        The GeneXpert MRSA Assay (FDA approved for NASAL specimens only), is one component of a comprehensive MRSA colonization surveillance program. It is not intended to diagnose MRSA infection nor to guide or monitor treatment for MRSA infections. Performed at [facility], [place] [address] [place], [place] [address]       Radiology Studies: No results found.   Scheduled Meds: . amLODipine  10 mg Oral Daily  . carvedilol  12.5 mg Oral BID WC  . chlorhexidine  15 mL Mouth Rinse BID  . Chlorhexidine Gluconate Cloth  6 each Topical Daily  . dexamethasone  4 mg Oral Q12H  . feeding  supplement (ENSURE ENLIVE)  237 mL Oral BID BM  . insulin aspart  0-9 Units Subcutaneous TID WC  . mouth rinse  15 mL Mouth Rinse q12n4p  . mirtazapine  15 mg Oral QHS  . mupirocin ointment  1 application Nasal BID  . nystatin-triamcinolone   Topical BID  . pantoprazole  40 mg Oral Daily   Continuous Infusions:   Marzetta Board, MD, PhD [facility] Pager [phone]  If 7PM-7AM, please contact night-coverage [email or web address] Password [email or web address] 03/12/2017, 1:00 PM

## 2017-03-13 ENCOUNTER — Inpatient Hospital Stay (HOSPITAL_COMMUNITY): Payer: PPO | Admitting: Certified Registered Nurse Anesthetist

## 2017-03-13 ENCOUNTER — Inpatient Hospital Stay (HOSPITAL_COMMUNITY)
Admission: EM | Disposition: A | Discharge status: Skilled Nursing Facility | Payer: Self-pay | Source: Home / Self Care | Attending: Neurosurgery

## 2017-03-13 DIAGNOSIS — C7931 Secondary malignant neoplasm of brain: Secondary | ICD-10-CM | POA: Diagnosis present

## 2017-03-13 HISTORY — PX: SUBOCCIPITAL CRANIECTOMY CERVICAL LAMINECTOMY: SHX5404

## 2017-03-13 LAB — POCT I-STAT 4, (NA,K, GLUC, HGB,HCT)
Glucose, Bld: 230 mg/dL — ABNORMAL HIGH (ref 65–99)
HCT: 39 % (ref 36.0–46.0)
Hemoglobin: 13.3 g/dL (ref 12.0–15.0)
Potassium: 3.9 mmol/L (ref 3.5–5.1)
Sodium: 139 mmol/L (ref 135–145)

## 2017-03-13 LAB — POCT I-STAT 7, (LYTES, BLD GAS, ICA,H+H)
Acid-Base Excess: 3 mmol/L — ABNORMAL HIGH (ref 0.0–2.0)
BICARBONATE: 26.5 mmol/L (ref 20.0–28.0)
CALCIUM ION: 1.26 mmol/L (ref 1.15–1.40)
HEMATOCRIT: 29 % — AB (ref 36.0–46.0)
HEMOGLOBIN: 9.9 g/dL — AB (ref 12.0–15.0)
O2 Saturation: 100 %
PH ART: 7.461 — AB (ref 7.350–7.450)
POTASSIUM: 4.5 mmol/L (ref 3.5–5.1)
SODIUM: 140 mmol/L (ref 135–145)
TCO2: 28 mmol/L (ref 22–32)
pCO2 arterial: 37.3 mmHg (ref 32.0–48.0)
pO2, Arterial: 231 mmHg — ABNORMAL HIGH (ref 83.0–108.0)

## 2017-03-13 LAB — CBC
HCT: 43 % (ref 36.0–46.0)
HCT: 33.7 % — ABNORMAL LOW (ref 36.0–46.0)
HEMOGLOBIN: 10.6 g/dL — AB (ref 12.0–15.0)
Hemoglobin: 14 g/dL (ref 12.0–15.0)
MCH: 30.8 pg (ref 26.0–34.0)
MCH: 30.3 pg (ref 26.0–34.0)
MCHC: 32.6 g/dL (ref 30.0–36.0)
MCHC: 31.5 g/dL (ref 30.0–36.0)
MCV: 94.7 fL (ref 78.0–100.0)
MCV: 96.3 fL (ref 78.0–100.0)
PLATELETS: 187 10*3/uL (ref 150–400)
PLATELETS: 175 10*3/uL (ref 150–400)
RBC: 4.54 MIL/uL (ref 3.87–5.11)
RBC: 3.5 MIL/uL — AB (ref 3.87–5.11)
RDW: 15.8 % — ABNORMAL HIGH (ref 11.5–15.5)
RDW: 16.4 % — ABNORMAL HIGH (ref 11.5–15.5)
WBC: 7.3 10*3/uL (ref 4.0–10.5)
WBC: 12.8 10*3/uL — ABNORMAL HIGH (ref 4.0–10.5)

## 2017-03-13 LAB — PREPARE RBC (CROSSMATCH)

## 2017-03-13 LAB — BASIC METABOLIC PANEL
Anion gap: 12 (ref 5–15)
Anion gap: 13 (ref 5–15)
BUN: 32 mg/dL — ABNORMAL HIGH (ref 6–20)
BUN: 30 mg/dL — ABNORMAL HIGH (ref 6–20)
CALCIUM: 9.3 mg/dL (ref 8.9–10.3)
CALCIUM: 8.1 mg/dL — AB (ref 8.9–10.3)
CO2: 22 mmol/L (ref 22–32)
CO2: 17 mmol/L — ABNORMAL LOW (ref 22–32)
CREATININE: 0.9 mg/dL (ref 0.44–1.00)
Chloride: 102 mmol/L (ref 101–111)
Chloride: 112 mmol/L — ABNORMAL HIGH (ref 101–111)
Creatinine, Ser: 1.01 mg/dL — ABNORMAL HIGH (ref 0.44–1.00)
GFR calc non Af Amer: >60 mL/min (ref >60)
GFR, EST NON AFRICAN AMERICAN: 57 mL/min — AB (ref >60)
GLUCOSE: 223 mg/dL — AB (ref 65–99)
Glucose, Bld: 286 mg/dL — ABNORMAL HIGH (ref 65–99)
Potassium: 4.8 mmol/L (ref 3.5–5.1)
Potassium: 4.8 mmol/L (ref 3.5–5.1)
SODIUM: 136 mmol/L (ref 135–145)
Sodium: 142 mmol/L (ref 135–145)

## 2017-03-13 LAB — GLUCOSE, CAPILLARY
GLUCOSE-CAPILLARY: 97 mg/dL (ref 65–99)
GLUCOSE-CAPILLARY: 98 mg/dL (ref 65–99)
GLUCOSE-CAPILLARY: 273 mg/dL — AB (ref 65–99)
Glucose-Capillary: 205 mg/dL — ABNORMAL HIGH (ref 65–99)
Glucose-Capillary: 181 mg/dL — ABNORMAL HIGH (ref 65–99)
Glucose-Capillary: 161 mg/dL — ABNORMAL HIGH (ref 65–99)
Glucose-Capillary: 134 mg/dL — ABNORMAL HIGH (ref 65–99)
Glucose-Capillary: 123 mg/dL — ABNORMAL HIGH (ref 65–99)

## 2017-03-13 LAB — ABO/RH: ABO/RH(D): A POS

## 2017-03-13 SURGERY — SUBOCCIPITAL CRANIECTOMY CERVICAL LAMINECTOMY/DURAPLASTY
Anesthesia: General | Site: Head | Laterality: Right

## 2017-03-13 MED ORDER — SODIUM CHLORIDE 0.9 % IV SOLN
INTRAVENOUS | Status: DC | PRN
  Administered 2017-03-13: 3.4 [IU]/h via INTRAVENOUS

## 2017-03-13 MED ORDER — NITROGLYCERIN 0.2 MG/ML ON CALL CATH LAB
INTRAVENOUS | Status: DC | PRN
  Administered 2017-03-13: 10 ug via INTRAVENOUS
  Administered 2017-03-13: 20 ug via INTRAVENOUS
  Administered 2017-03-13 (×2): 10 ug via INTRAVENOUS
  Administered 2017-03-13: 20 ug via INTRAVENOUS
  Administered 2017-03-13 (×3): 10 ug via INTRAVENOUS

## 2017-03-13 MED ORDER — DOCUSATE SODIUM 100 MG PO CAPS
100.0000 mg | ORAL_CAPSULE | Freq: Two times a day (BID) | ORAL | Status: DC
  Administered 2017-03-14 – 2017-03-22 (×15): 100 mg via ORAL
  Filled 2017-03-13 (×18): qty 1

## 2017-03-13 MED ORDER — REMIFENTANIL HCL 1 MG IV SOLR
0.0500 ug/kg/min | INTRAVENOUS | Status: DC
  Filled 2017-03-13: qty 5000

## 2017-03-13 MED ORDER — OXYCODONE HCL 5 MG PO TABS: 5.0000 mg | ORAL_TABLET | Freq: Once | ORAL | Status: DC | PRN

## 2017-03-13 MED ORDER — FENTANYL CITRATE (PF) 100 MCG/2ML IJ SOLN
25.0000 ug | INTRAMUSCULAR | Status: DC | PRN
  Administered 2017-03-13: 50 ug via INTRAVENOUS

## 2017-03-13 MED ORDER — THROMBIN 5000 UNITS EX SOLR
CUTANEOUS | Status: AC
  Filled 2017-03-13: qty 5000

## 2017-03-13 MED ORDER — FENTANYL CITRATE (PF) 250 MCG/5ML IJ SOLN
INTRAMUSCULAR | Status: AC
  Filled 2017-03-13: qty 5

## 2017-03-13 MED ORDER — LIDOCAINE HCL (CARDIAC) 20 MG/ML IV SOLN
INTRAVENOUS | Status: DC | PRN
  Administered 2017-03-13: 80 mg via INTRAVENOUS

## 2017-03-13 MED ORDER — OXYCODONE HCL 5 MG/5ML PO SOLN: 5.0000 mg | Freq: Once | ORAL | Status: DC | PRN

## 2017-03-13 MED ORDER — POTASSIUM CHLORIDE IN NACL 20-0.9 MEQ/L-% IV SOLN
INTRAVENOUS | Status: DC
  Administered 2017-03-13: 20:00:00 via INTRAVENOUS
  Filled 2017-03-13 (×2): qty 1000

## 2017-03-13 MED ORDER — EPHEDRINE 5 MG/ML INJ
INTRAVENOUS | Status: AC
  Filled 2017-03-13: qty 10

## 2017-03-13 MED ORDER — EPHEDRINE SULFATE 50 MG/ML IJ SOLN
INTRAMUSCULAR | Status: DC | PRN
  Administered 2017-03-13 (×2): 5 mg via INTRAVENOUS
  Administered 2017-03-13: 10 mg via INTRAVENOUS
  Administered 2017-03-13: 2.5 mg via INTRAVENOUS

## 2017-03-13 MED ORDER — THROMBIN 20000 UNITS EX SOLR
CUTANEOUS | Status: AC
  Filled 2017-03-13: qty 20000

## 2017-03-13 MED ORDER — DEXAMETHASONE SODIUM PHOSPHATE 10 MG/ML IJ SOLN
6.0000 mg | Freq: Four times a day (QID) | INTRAMUSCULAR | Status: DC
  Administered 2017-03-13: 6 mg via INTRAVENOUS
  Filled 2017-03-13: qty 1

## 2017-03-13 MED ORDER — FENTANYL CITRATE (PF) 100 MCG/2ML IJ SOLN
INTRAMUSCULAR | Status: AC
  Administered 2017-03-13: 50 ug via INTRAVENOUS
  Filled 2017-03-13: qty 2

## 2017-03-13 MED ORDER — DEXMEDETOMIDINE HCL IN NACL 200 MCG/50ML IV SOLN
0.4000 ug/kg/h | INTRAVENOUS | Status: DC
  Administered 2017-03-13: 0.4 ug/kg/h via INTRAVENOUS
  Administered 2017-03-13: 0.3 ug/kg/h via INTRAVENOUS
  Filled 2017-03-13 (×3): qty 50

## 2017-03-13 MED ORDER — ONDANSETRON HCL 4 MG/2ML IJ SOLN: 4.0000 mg | Freq: Once | INTRAMUSCULAR | Status: DC | PRN

## 2017-03-13 MED ORDER — PROPOFOL 10 MG/ML IV BOLUS
INTRAVENOUS | Status: AC
  Filled 2017-03-13: qty 40

## 2017-03-13 MED ORDER — DEXAMETHASONE SODIUM PHOSPHATE 4 MG/ML IJ SOLN
4.0000 mg | Freq: Four times a day (QID) | INTRAMUSCULAR | Status: AC
  Administered 2017-03-14 – 2017-03-15 (×4): 4 mg via INTRAVENOUS
  Filled 2017-03-13 (×4): qty 1

## 2017-03-13 MED ORDER — SODIUM CHLORIDE 0.9 % IV SOLN
INTRAVENOUS | Status: DC | PRN
  Administered 2017-03-13: 1000 mg via INTRAVENOUS

## 2017-03-13 MED ORDER — SODIUM CHLORIDE 0.9 % IV SOLN
500.0000 mg | Freq: Two times a day (BID) | INTRAVENOUS | Status: DC
  Administered 2017-03-13 – 2017-03-15 (×4): 500 mg via INTRAVENOUS
  Filled 2017-03-13 (×5): qty 5

## 2017-03-13 MED ORDER — SODIUM CHLORIDE 0.9 % IV SOLN
INTRAVENOUS | Status: DC | PRN
  Administered 2017-03-13 (×2): via INTRAVENOUS

## 2017-03-13 MED ORDER — GLYCOPYRROLATE 0.2 MG/ML IJ SOLN
INTRAMUSCULAR | Status: DC | PRN
  Administered 2017-03-13 (×2): 0.1 mg via INTRAVENOUS

## 2017-03-13 MED ORDER — ONDANSETRON HCL 4 MG PO TABS: 4.0000 mg | ORAL_TABLET | ORAL | Status: DC | PRN

## 2017-03-13 MED ORDER — LABETALOL HCL 5 MG/ML IV SOLN
10.0000 mg | INTRAVENOUS | Status: DC | PRN
  Administered 2017-03-13: 10 mg via INTRAVENOUS
  Filled 2017-03-13 (×2): qty 8
  Filled 2017-03-13: qty 4

## 2017-03-13 MED ORDER — PROMETHAZINE HCL 25 MG PO TABS: 12.5000 mg | ORAL_TABLET | ORAL | Status: DC | PRN

## 2017-03-13 MED ORDER — MANNITOL 25 % IV SOLN
INTRAVENOUS | Status: DC | PRN
  Administered 2017-03-13: 25 g via INTRAVENOUS

## 2017-03-13 MED ORDER — INSULIN ASPART 100 UNIT/ML ~~LOC~~ SOLN
0.0000 [IU] | Freq: Three times a day (TID) | SUBCUTANEOUS | Status: DC
Start: 2017-03-14 — End: 2017-04-14
  Administered 2017-03-14: 7 [IU] via SUBCUTANEOUS
  Administered 2017-03-14: 11 [IU] via SUBCUTANEOUS
  Administered 2017-03-14 – 2017-03-15 (×2): 7 [IU] via SUBCUTANEOUS
  Administered 2017-03-15: 4 [IU] via SUBCUTANEOUS
  Administered 2017-03-15: 7 [IU] via SUBCUTANEOUS
  Administered 2017-03-16: 15 [IU] via SUBCUTANEOUS
  Administered 2017-03-16: 4 [IU] via SUBCUTANEOUS
  Administered 2017-03-16: 11 [IU] via SUBCUTANEOUS
  Administered 2017-03-17: 7 [IU] via SUBCUTANEOUS
  Administered 2017-03-17: 11 [IU] via SUBCUTANEOUS
  Administered 2017-03-17: 7 [IU] via SUBCUTANEOUS
  Administered 2017-03-18: 11 [IU] via SUBCUTANEOUS
  Administered 2017-03-18: 7 [IU] via SUBCUTANEOUS
  Administered 2017-03-19 (×2): 15 [IU] via SUBCUTANEOUS
  Administered 2017-03-19 – 2017-03-20 (×4): 7 [IU] via SUBCUTANEOUS
  Administered 2017-03-21: 4 [IU] via SUBCUTANEOUS
  Administered 2017-03-21: 11 [IU] via SUBCUTANEOUS
  Administered 2017-03-21: 4 [IU] via SUBCUTANEOUS
  Administered 2017-03-22: 7 [IU] via SUBCUTANEOUS
  Administered 2017-03-22: 4 [IU] via SUBCUTANEOUS
  Administered 2017-03-23: 7 [IU] via SUBCUTANEOUS
  Administered 2017-03-23: 4 [IU] via SUBCUTANEOUS
  Administered 2017-03-23: 7 [IU] via SUBCUTANEOUS
  Administered 2017-03-24: 4 [IU] via SUBCUTANEOUS
  Administered 2017-03-24: 3 [IU] via SUBCUTANEOUS
  Administered 2017-03-25 (×3): 7 [IU] via SUBCUTANEOUS
  Administered 2017-03-26: 4 [IU] via SUBCUTANEOUS
  Administered 2017-03-26 (×2): 11 [IU] via SUBCUTANEOUS
  Administered 2017-03-27: 3 [IU] via SUBCUTANEOUS
  Administered 2017-03-27 – 2017-03-29 (×5): 4 [IU] via SUBCUTANEOUS
  Administered 2017-03-29: 11 [IU] via SUBCUTANEOUS
  Administered 2017-03-30: 4 [IU] via SUBCUTANEOUS
  Administered 2017-03-30 – 2017-03-31 (×3): 7 [IU] via SUBCUTANEOUS
  Administered 2017-03-31 – 2017-04-01 (×3): 4 [IU] via SUBCUTANEOUS
  Administered 2017-04-01: 11 [IU] via SUBCUTANEOUS
  Administered 2017-04-02: 7 [IU] via SUBCUTANEOUS
  Administered 2017-04-03: 4 [IU] via SUBCUTANEOUS
  Administered 2017-04-03: 3 [IU] via SUBCUTANEOUS
  Administered 2017-04-04: 7 [IU] via SUBCUTANEOUS
  Administered 2017-04-04: 3 [IU] via SUBCUTANEOUS
  Administered 2017-04-04: 4 [IU] via SUBCUTANEOUS
  Administered 2017-04-05: 3 [IU] via SUBCUTANEOUS
  Administered 2017-04-05 (×2): 4 [IU] via SUBCUTANEOUS
  Administered 2017-04-06: 7 [IU] via SUBCUTANEOUS
  Administered 2017-04-06 – 2017-04-08 (×3): 4 [IU] via SUBCUTANEOUS
  Administered 2017-04-08 – 2017-04-09 (×3): 3 [IU] via SUBCUTANEOUS
  Administered 2017-04-10 (×2): 4 [IU] via SUBCUTANEOUS
  Administered 2017-04-11: 3 [IU] via SUBCUTANEOUS
  Administered 2017-04-11 (×2): 4 [IU] via SUBCUTANEOUS
  Administered 2017-04-12 – 2017-04-13 (×3): 3 [IU] via SUBCUTANEOUS

## 2017-03-13 MED ORDER — SUGAMMADEX SODIUM 200 MG/2ML IV SOLN
INTRAVENOUS | Status: AC
  Filled 2017-03-13: qty 2

## 2017-03-13 MED ORDER — SODIUM CHLORIDE 0.9 % IV SOLN
1.0000 [IU]/h | INTRAVENOUS | Status: DC
  Filled 2017-03-13: qty 1

## 2017-03-13 MED ORDER — HEMOSTATIC AGENTS (NO CHARGE) OPTIME
TOPICAL | Status: DC | PRN
  Administered 2017-03-13: 1 via TOPICAL

## 2017-03-13 MED ORDER — THROMBIN (RECOMBINANT) 20000 UNITS EX SOLR
CUTANEOUS | Status: DC | PRN
  Administered 2017-03-13 (×2): 20 mL via TOPICAL

## 2017-03-13 MED ORDER — PHENYLEPHRINE HCL 10 MG/ML IJ SOLN
INTRAVENOUS | Status: DC | PRN
  Administered 2017-03-13: 40 ug/min via INTRAVENOUS

## 2017-03-13 MED ORDER — DEXAMETHASONE SODIUM PHOSPHATE 4 MG/ML IJ SOLN: 4.0000 mg | Freq: Four times a day (QID) | INTRAMUSCULAR | Status: DC

## 2017-03-13 MED ORDER — THROMBIN (RECOMBINANT) 5000 UNITS EX SOLR
OROMUCOSAL | Status: DC | PRN
  Administered 2017-03-13 (×2): 5 mL via TOPICAL

## 2017-03-13 MED ORDER — ACETAMINOPHEN 325 MG PO TABS: 325.0000 mg | ORAL_TABLET | ORAL | Status: DC | PRN

## 2017-03-13 MED ORDER — ONDANSETRON HCL 4 MG/2ML IJ SOLN
INTRAMUSCULAR | Status: AC
  Filled 2017-03-13: qty 2

## 2017-03-13 MED ORDER — PROPOFOL 10 MG/ML IV BOLUS
INTRAVENOUS | Status: DC | PRN
  Administered 2017-03-13: 30 mg via INTRAVENOUS
  Administered 2017-03-13: 170 mg via INTRAVENOUS

## 2017-03-13 MED ORDER — DEXAMETHASONE SODIUM PHOSPHATE 4 MG/ML IJ SOLN: 4.0000 mg | Freq: Three times a day (TID) | INTRAMUSCULAR | Status: DC

## 2017-03-13 MED ORDER — SODIUM CHLORIDE 0.9 % IV SOLN
INTRAVENOUS | Status: DC | PRN
  Administered 2017-03-13: .05 ug/kg/min via INTRAVENOUS

## 2017-03-13 MED ORDER — ACETAMINOPHEN 160 MG/5ML PO SOLN: 325.0000 mg | ORAL | Status: DC | PRN

## 2017-03-13 MED ORDER — ROCURONIUM BROMIDE 10 MG/ML (PF) SYRINGE
PREFILLED_SYRINGE | INTRAVENOUS | Status: AC
  Filled 2017-03-13: qty 5

## 2017-03-13 MED ORDER — SUGAMMADEX SODIUM 200 MG/2ML IV SOLN
INTRAVENOUS | Status: DC | PRN
  Administered 2017-03-13: 200 mg via INTRAVENOUS

## 2017-03-13 MED ORDER — PHENYLEPHRINE 40 MCG/ML (10ML) SYRINGE FOR IV PUSH (FOR BLOOD PRESSURE SUPPORT)
PREFILLED_SYRINGE | INTRAVENOUS | Status: AC
  Filled 2017-03-13: qty 10

## 2017-03-13 MED ORDER — LIDOCAINE 2% (20 MG/ML) 5 ML SYRINGE
INTRAMUSCULAR | Status: AC
  Filled 2017-03-13: qty 5

## 2017-03-13 MED ORDER — VANCOMYCIN HCL IN DEXTROSE 1-5 GM/200ML-% IV SOLN
1000.0000 mg | INTRAVENOUS | Status: DC
  Filled 2017-03-13: qty 200

## 2017-03-13 MED ORDER — BUPIVACAINE HCL (PF) 0.25 % IJ SOLN
INTRAMUSCULAR | Status: DC | PRN
  Administered 2017-03-13: 10 mL

## 2017-03-13 MED ORDER — PHENYLEPHRINE HCL 10 MG/ML IJ SOLN
INTRAMUSCULAR | Status: DC | PRN
  Administered 2017-03-13 (×3): 80 ug via INTRAVENOUS
  Administered 2017-03-13: 120 ug via INTRAVENOUS
  Administered 2017-03-13: 80 ug via INTRAVENOUS

## 2017-03-13 MED ORDER — HYDROMORPHONE HCL 1 MG/ML IJ SOLN
0.5000 mg | INTRAMUSCULAR | Status: DC | PRN
  Administered 2017-03-14 – 2017-04-02 (×11): 0.5 mg via INTRAVENOUS
  Filled 2017-03-13 (×11): qty 0.5

## 2017-03-13 MED ORDER — DEXAMETHASONE SODIUM PHOSPHATE 10 MG/ML IJ SOLN
INTRAMUSCULAR | Status: AC
  Filled 2017-03-13: qty 1

## 2017-03-13 MED ORDER — BACITRACIN ZINC 500 UNIT/GM EX OINT
TOPICAL_OINTMENT | CUTANEOUS | Status: DC | PRN
  Administered 2017-03-13: 1 via TOPICAL

## 2017-03-13 MED ORDER — ONDANSETRON HCL 4 MG/2ML IJ SOLN: 4.0000 mg | INTRAMUSCULAR | Status: DC | PRN

## 2017-03-13 MED ORDER — FENTANYL CITRATE (PF) 100 MCG/2ML IJ SOLN
INTRAMUSCULAR | Status: DC | PRN
  Administered 2017-03-13: 50 ug via INTRAVENOUS
  Administered 2017-03-13 (×2): 25 ug via INTRAVENOUS
  Administered 2017-03-13: 50 ug via INTRAVENOUS
  Administered 2017-03-13: 100 ug via INTRAVENOUS

## 2017-03-13 MED ORDER — BUPIVACAINE HCL (PF) 0.25 % IJ SOLN
INTRAMUSCULAR | Status: AC
  Filled 2017-03-13: qty 30

## 2017-03-13 MED ORDER — ARTIFICIAL TEARS OPHTHALMIC OINT
TOPICAL_OINTMENT | OPHTHALMIC | Status: DC | PRN
  Administered 2017-03-13: 1 via OPHTHALMIC

## 2017-03-13 MED ORDER — SODIUM CHLORIDE 0.9 % IV SOLN
Freq: Once | INTRAVENOUS | Status: AC
  Administered 2017-03-13 (×2): via INTRAVENOUS

## 2017-03-13 MED ORDER — NICARDIPINE HCL IN NACL 20-0.86 MG/200ML-% IV SOLN
3.0000 mg/h | INTRAVENOUS | Status: DC
  Filled 2017-03-13: qty 200

## 2017-03-13 MED ORDER — VANCOMYCIN HCL IN DEXTROSE 1-5 GM/200ML-% IV SOLN
1000.0000 mg | Freq: Two times a day (BID) | INTRAVENOUS | Status: AC
  Administered 2017-03-13 – 2017-03-14 (×2): 1000 mg via INTRAVENOUS
  Filled 2017-03-13 (×2): qty 200

## 2017-03-13 MED ORDER — MIDAZOLAM HCL 2 MG/2ML IJ SOLN
INTRAMUSCULAR | Status: AC
  Filled 2017-03-13: qty 2

## 2017-03-13 MED ORDER — 0.9 % SODIUM CHLORIDE (POUR BTL) OPTIME
TOPICAL | Status: DC | PRN
  Administered 2017-03-13 (×2): 1000 mL

## 2017-03-13 MED ORDER — BACITRACIN ZINC 500 UNIT/GM EX OINT
TOPICAL_OINTMENT | CUTANEOUS | Status: AC
  Filled 2017-03-13: qty 28.35

## 2017-03-13 MED ORDER — ROCURONIUM BROMIDE 100 MG/10ML IV SOLN
INTRAVENOUS | Status: DC | PRN
  Administered 2017-03-13: 40 mg via INTRAVENOUS
  Administered 2017-03-13: 30 mg via INTRAVENOUS
  Administered 2017-03-13: 70 mg via INTRAVENOUS
  Administered 2017-03-13: 40 mg via INTRAVENOUS
  Administered 2017-03-13 (×3): 20 mg via INTRAVENOUS
  Administered 2017-03-13: 30 mg via INTRAVENOUS

## 2017-03-13 MED ORDER — LABETALOL HCL 5 MG/ML IV SOLN
INTRAVENOUS | Status: AC
  Administered 2017-03-13: 10 mg via INTRAVENOUS
  Filled 2017-03-13: qty 4

## 2017-03-13 MED ORDER — PANTOPRAZOLE SODIUM 40 MG IV SOLR
40.0000 mg | Freq: Every day | INTRAVENOUS | Status: DC
  Administered 2017-03-13 – 2017-03-14 (×2): 40 mg via INTRAVENOUS
  Filled 2017-03-13 (×2): qty 40

## 2017-03-13 MED ORDER — DEXAMETHASONE SODIUM PHOSPHATE 10 MG/ML IJ SOLN
10.0000 mg | Freq: Four times a day (QID) | INTRAMUSCULAR | Status: AC
  Administered 2017-03-13 – 2017-03-14 (×3): 10 mg via INTRAVENOUS
  Filled 2017-03-13 (×3): qty 1

## 2017-03-13 MED ORDER — BACITRACIN ZINC 500 UNIT/GM EX OINT
TOPICAL_OINTMENT | CUTANEOUS | Status: DC | PRN
  Administered 2017-03-13 – 2017-03-26 (×5): via TOPICAL
  Filled 2017-03-13: qty 28.35

## 2017-03-13 MED ORDER — ALBUMIN HUMAN 5 % IV SOLN
INTRAVENOUS | Status: DC | PRN
  Administered 2017-03-13: 15:00:00 via INTRAVENOUS

## 2017-03-13 MED ORDER — MICROFIBRILLAR COLL HEMOSTAT EX PADS
MEDICATED_PAD | CUTANEOUS | Status: DC | PRN
  Administered 2017-03-13: 1 via TOPICAL

## 2017-03-13 MED ORDER — DEXAMETHASONE SODIUM PHOSPHATE 10 MG/ML IJ SOLN
INTRAMUSCULAR | Status: DC | PRN
  Administered 2017-03-13: 10 mg via INTRAVENOUS

## 2017-03-13 MED ORDER — SODIUM CHLORIDE 0.9 % IR SOLN
Status: DC | PRN
  Administered 2017-03-13: 500 mL

## 2017-03-13 MED ORDER — INSULIN ASPART 100 UNIT/ML ~~LOC~~ SOLN
4.0000 [IU] | Freq: Three times a day (TID) | SUBCUTANEOUS | Status: DC
  Administered 2017-03-14 – 2017-03-19 (×16): 4 [IU] via SUBCUTANEOUS

## 2017-03-13 SURGICAL SUPPLY — 93 items
BAG DECANTER FOR FLEXI CONT (MISCELLANEOUS) ×3 IMPLANT
BENZOIN TINCTURE PRP APPL 2/3 (GAUZE/BANDAGES/DRESSINGS) ×3 IMPLANT
BLADE CLIPPER SURG (BLADE) ×6 IMPLANT
BLADE SURG 11 STRL SS (BLADE) ×3 IMPLANT
BLADE ULTRA TIP 2M (BLADE) IMPLANT
BUR ACORN 9.0 PRECISION (BURR) ×2 IMPLANT
BUR ACORN 9.0MM PRECISION (BURR) ×1
CABLE BIPOLOR RESECTION CORD (MISCELLANEOUS) IMPLANT
CANISTER SUCT 3000ML PPV (MISCELLANEOUS) ×3 IMPLANT
CARTRIDGE OIL MAESTRO DRILL (MISCELLANEOUS) ×1 IMPLANT
CLIP VESOCCLUDE MED 6/CT (CLIP) ×3 IMPLANT
DECANTER SPIKE VIAL GLASS SM (MISCELLANEOUS) ×3 IMPLANT
DERMABOND ADVANCED (GAUZE/BANDAGES/DRESSINGS)
DERMABOND ADVANCED .7 DNX12 (GAUZE/BANDAGES/DRESSINGS) IMPLANT
DIFFUSER DRILL AIR PNEUMATIC (MISCELLANEOUS) ×3 IMPLANT
DRAPE LAPAROTOMY 100X72 PEDS (DRAPES) ×3 IMPLANT
DRAPE MICROSCOPE LEICA (MISCELLANEOUS) ×3 IMPLANT
DRAPE SURG 17X23 STRL (DRAPES) ×6 IMPLANT
DRAPE WARM FLUID 44X44 (DRAPE) ×3 IMPLANT
DRSG OPSITE POSTOP 4X8 (GAUZE/BANDAGES/DRESSINGS) ×3 IMPLANT
ELECT CAUTERY BLADE 6.4 (BLADE) IMPLANT
ELECT REM PT RETURN 9FT ADLT (ELECTROSURGICAL) ×3
ELECTRODE REM PT RTRN 9FT ADLT (ELECTROSURGICAL) ×1 IMPLANT
EVACUATOR 1/8 PVC DRAIN (DRAIN) IMPLANT
EVACUATOR SILICONE 100CC (DRAIN) IMPLANT
FORCEPS BIPOLAR SPETZLER 8 1.0 (NEUROSURGERY SUPPLIES) ×3 IMPLANT
GAUZE SPONGE 4X4 12PLY STRL (GAUZE/BANDAGES/DRESSINGS) IMPLANT
GAUZE SPONGE 4X4 16PLY XRAY LF (GAUZE/BANDAGES/DRESSINGS) IMPLANT
GLOVE BIO SURGEON STRL SZ 6.5 (GLOVE) ×2 IMPLANT
GLOVE BIO SURGEON STRL SZ7 (GLOVE) ×3 IMPLANT
GLOVE BIO SURGEON STRL SZ7.5 (GLOVE) IMPLANT
GLOVE BIO SURGEON STRL SZ8 (GLOVE) ×3 IMPLANT
GLOVE BIO SURGEON STRL SZ8.5 (GLOVE) IMPLANT
GLOVE BIO SURGEONS STRL SZ 6.5 (GLOVE) ×1
GLOVE BIOGEL M 8.0 STRL (GLOVE) IMPLANT
GLOVE BIOGEL PI IND STRL 6.5 (GLOVE) ×1 IMPLANT
GLOVE BIOGEL PI IND STRL 7.0 (GLOVE) ×3 IMPLANT
GLOVE BIOGEL PI IND STRL 8 (GLOVE) ×2 IMPLANT
GLOVE BIOGEL PI INDICATOR 6.5 (GLOVE) ×2
GLOVE BIOGEL PI INDICATOR 7.0 (GLOVE) ×6
GLOVE BIOGEL PI INDICATOR 8 (GLOVE) ×4
GLOVE ECLIPSE 6.5 STRL STRAW (GLOVE) IMPLANT
GLOVE ECLIPSE 7.0 STRL STRAW (GLOVE) IMPLANT
GLOVE ECLIPSE 7.5 STRL STRAW (GLOVE) ×9 IMPLANT
GLOVE ECLIPSE 8.0 STRL XLNG CF (GLOVE) IMPLANT
GLOVE ECLIPSE 8.5 STRL (GLOVE) IMPLANT
GLOVE EXAM NITRILE LRG STRL (GLOVE) IMPLANT
GLOVE EXAM NITRILE XL STR (GLOVE) IMPLANT
GLOVE EXAM NITRILE XS STR PU (GLOVE) IMPLANT
GLOVE INDICATOR 6.5 STRL GRN (GLOVE) IMPLANT
GLOVE INDICATOR 7.0 STRL GRN (GLOVE) IMPLANT
GLOVE INDICATOR 7.5 STRL GRN (GLOVE) IMPLANT
GLOVE INDICATOR 8.0 STRL GRN (GLOVE) IMPLANT
GLOVE INDICATOR 8.5 STRL (GLOVE) ×3 IMPLANT
GLOVE OPTIFIT SS 8.0 STRL (GLOVE) IMPLANT
GLOVE SURG SS PI 6.5 STRL IVOR (GLOVE) IMPLANT
GOWN STRL REUS W/ TWL LRG LVL3 (GOWN DISPOSABLE) ×2 IMPLANT
GOWN STRL REUS W/ TWL XL LVL3 (GOWN DISPOSABLE) ×1 IMPLANT
GOWN STRL REUS W/TWL 2XL LVL3 (GOWN DISPOSABLE) ×3 IMPLANT
GOWN STRL REUS W/TWL LRG LVL3 (GOWN DISPOSABLE) ×4
GOWN STRL REUS W/TWL XL LVL3 (GOWN DISPOSABLE) ×2
GRAFT DURAGEN MATRIX 2WX2L ×3 IMPLANT
HEMOSTAT SURGICEL 2X14 (HEMOSTASIS) ×3 IMPLANT
KIT BASIN OR (CUSTOM PROCEDURE TRAY) ×3 IMPLANT
KIT ROOM TURNOVER OR (KITS) ×3 IMPLANT
NEEDLE HYPO 22GX1.5 SAFETY (NEEDLE) ×3 IMPLANT
NS IRRIG 1000ML POUR BTL (IV SOLUTION) ×3 IMPLANT
OIL CARTRIDGE MAESTRO DRILL (MISCELLANEOUS) ×3
PACK LAMINECTOMY NEURO (CUSTOM PROCEDURE TRAY) ×3 IMPLANT
PAD ARMBOARD 7.5X6 YLW CONV (MISCELLANEOUS) ×15 IMPLANT
PATTIES SURGICAL .5 X.5 (GAUZE/BANDAGES/DRESSINGS) ×12 IMPLANT
PATTIES SURGICAL .5 X3 (DISPOSABLE) ×15 IMPLANT
PATTIES SURGICAL 1/4 X 3 (GAUZE/BANDAGES/DRESSINGS) IMPLANT
PATTIES SURGICAL 1X1 (DISPOSABLE) ×9 IMPLANT
RUBBERBAND STERILE (MISCELLANEOUS) ×6 IMPLANT
SPONGE LAP 4X18 X RAY DECT (DISPOSABLE) IMPLANT
SPONGE SURGIFOAM ABS GEL 100 (HEMOSTASIS) ×6 IMPLANT
SPONGE SURGIFOAM ABS GEL SZ50 (HEMOSTASIS) ×3 IMPLANT
STAPLER SKIN PROX WIDE 3.9 (STAPLE) ×3 IMPLANT
SUT ETHILON 2 0 FS 18 (SUTURE) IMPLANT
SUT ETHILON 3 0 FSL (SUTURE) ×3 IMPLANT
SUT NURALON 4 0 TR CR/8 (SUTURE) ×6 IMPLANT
SUT PROLENE 6 0 BV (SUTURE) IMPLANT
SUT VIC AB 1 CT1 18XBRD ANBCTR (SUTURE) ×1 IMPLANT
SUT VIC AB 1 CT1 8-18 (SUTURE) ×2
SUT VIC AB 2-0 CT1 18 (SUTURE) ×6 IMPLANT
SUT VIC AB 3-0 SH 8-18 (SUTURE) IMPLANT
SYR CONTROL 10ML LL (SYRINGE) IMPLANT
TOWEL GREEN STERILE (TOWEL DISPOSABLE) ×3 IMPLANT
TOWEL GREEN STERILE FF (TOWEL DISPOSABLE) ×3 IMPLANT
TRAY FOLEY W/METER SILVER 16FR (SET/KITS/TRAYS/PACK) ×3 IMPLANT
UNDERPAD 30X30 (UNDERPADS AND DIAPERS) IMPLANT
WATER STERILE IRR 1000ML POUR (IV SOLUTION) ×3 IMPLANT

## 2017-03-13 NOTE — Progress Notes (Signed)
Pharmacy Antibiotic Note  Denise Morton is a 67 y.o. female who underwent a craniectomy for resection of a R-cerebella met earlier today. Pharmacy consulted to dose Vancomycin for surgical prophylaxis x24h post-op.   The patient received Vancomycin 1g IV x 1 at 0730 this morning. SCr 0.9, CrCl~70-80 ml/min.   Plan: 1. Vancomycin 1g IV every 12 hours x 2 doses (next dose at [place] today) 2. Pharmacy will sign off as no further dose adjustments expected at this time.   Height: 5' 7"  (170.2 cm) Weight: 214 lb 15.2 oz (97.5 kg) IBW/kg (Calculated) : 61.6  Temp (24hrs), Avg:98.2 F (36.8 C), Min:97.8 F (36.6 C), Max:98.6 F (37 C)  Recent Labs  Lab 03/07/17 1336  03/07/17 1621 03/07/17 1835 03/07/17 2117 03/08/17 0012 03/08/17 0347 03/10/17 1718 03/11/17 0620 03/13/17 0406  WBC 8.5  --   --   --   --   --  4.7  --  7.2 7.3  CREATININE 1.12*  --   --   --   --   --  0.65 0.79  --  0.90  LATICACIDVEN  --    < > 2.20* 2.6* 3.5* 3.3* 1.4  --   --   --    < > = values in this interval not displayed.    Estimated Creatinine Clearance: 73.8 mL/min (by C-G formula based on SCr of 0.9 mg/dL).    Allergies  Allergen Reactions  . Penicillin G Rash    Has patient had a PCN reaction causing immediate rash, facial/tongue/throat swelling, SOB or lightheadedness with hypotension: Unknown Has patient had a PCN reaction causing severe rash involving mucus membranes or skin necrosis: Unknown Has patient had a PCN reaction that required hospitalization: Unknown Has patient had a PCN reaction occurring within the last 10 years: Unknown If all of the above answers are "NO", then may proceed with Cephalosporin use.   . Sulfa Antibiotics Rash    Thank you for allowing pharmacy to be a part of this patient's care.  Lawson Radar 03/13/2017 6:33 PM

## 2017-03-13 NOTE — Transfer of Care (Signed)
Immediate Anesthesia Transfer of Care Note  Patient: Denise Morton  Procedure(s) Performed: SUBOCCIPITAL CRANIECTOMY  for cerebellar mass (Right Head)  Patient Location: PACU  Anesthesia Type:General  Level of Consciousness: drowsy and patient cooperative  Airway & Oxygen Therapy: Patient Spontanous Breathing and Patient connected to face mask oxygen  Post-op Assessment: Report given to RN, Post -op Vital signs reviewed and stable, Patient moving all extremities X 4 and Patient able to stick tongue midline  Post vital signs: Reviewed  Last Vitals:  Vitals:   03/13/17 0400 03/13/17 0410  BP: (!) 149/95   Pulse: 73   Resp: 14   Temp:  36.8 C  SpO2: 94%     Last Pain:  Vitals:   03/13/17 0410  TempSrc: Oral  PainSc:          Complications: No apparent anesthesia complications

## 2017-03-13 NOTE — Anesthesia Preprocedure Evaluation (Addendum)
Anesthesia Evaluation  Patient identified by MRN, date of birth, ID band Patient awake    Reviewed: Allergy & Precautions, NPO status , Patient's Chart, lab work & pertinent test results, reviewed documented beta blocker date and time   Airway Mallampati: II  TM Distance: <3 FB Neck ROM: Full    Dental  (+) Dental Advisory Given, Teeth Intact   Pulmonary neg pulmonary ROS, former smoker,    Pulmonary exam normal breath sounds clear to auscultation       Cardiovascular hypertension, Pt. on home beta blockers and Pt. on medications Normal cardiovascular exam Rhythm:Regular Rate:Normal  '19 TTE - Severe LVH. EF was in the range of 65%   to 70%. Grade 1 diastolic dysfunction. Left atrium was mildly dilated. RV cavity size was mildly dilated. Wall   thickness was normal.   Neuro/Psych negative neurological ROS  negative psych ROS   GI/Hepatic negative GI ROS, Neg liver ROS,   Endo/Other  diabetes, Poorly ControlledObesity  Renal/GU negative Renal ROS  negative genitourinary   Musculoskeletal negative musculoskeletal ROS (+)   Abdominal (+) + obese,   Peds  Hematology negative hematology ROS (+)   Anesthesia Other Findings Central retinal vein occlusion, right breast cancer with liver/lung/bone mets  Reproductive/Obstetrics                            Anesthesia Physical Anesthesia Plan  ASA: III  Anesthesia Plan: General   Post-op Pain Management:    Induction: Intravenous  PONV Risk Score and Plan: 4 or greater and Treatment may vary due to age or medical condition, Ondansetron, Dexamethasone, Midazolam and Scopolamine patch - Pre-op  Airway Management Planned: Oral ETT  Additional Equipment: Arterial line  Intra-op Plan:   Post-operative Plan: Possible Post-op intubation/ventilation  Informed Consent: I have reviewed the patients History and Physical, chart, labs and discussed the  procedure including the risks, benefits and alternatives for the proposed anesthesia with the patient or authorized representative who has indicated his/her understanding and acceptance.   Dental advisory given  Plan Discussed with: CRNA  Anesthesia Plan Comments: (2 large bore IV's)        Anesthesia Quick Evaluation

## 2017-03-13 NOTE — Progress Notes (Signed)
Pt had removed PIV's in PACU as noted while RN was giving report, stated "I don't want these anymore" RN able to redirect pt focus to holding blankets. Pt became agitated enroute to 4N- attempting to climb out of bed once on elevator - states " I need to get out of here" Pt remains only oriented to name- denies having surgery, on arrival to 4N- attempted to bite RN & scratch RNs at bedside. 4N to contact Dr Saintclair Halsted

## 2017-03-13 NOTE — Progress Notes (Signed)
Agitated - pulling at lines

## 2017-03-13 NOTE — Op Note (Signed)
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  EBL: Minimal  History of present illness: 67 year old female with history of breast carcinoma presented with speech difficulty mental status changes workup revealed a large 6 cm right cerebellar met. Due to patient's progressive conical syndrome imaging findings and history are recommended some occipital craniectomy for resection of right cerebellar met. I extensively went over the risks and benefits of the operation the patient as well as perioperative course and medications of outcome alternatives of surgery and she understood and agreed to proceed forward.  Operative procedure: Patient brought into the or was induced under general anesthesia positioned prone in pins back side of her head and neck were shaved prepped and draped in routine sterile fashion. A slightly paramedian incision was drawn out and Bovie light car was used to to identify and dissect out the sub-occiput. I dissected this down to the foramen magnum. Then used utilizing high-speed drill and drilled into the right cerebral hemisphere as well as a left cerebral hemisphere across the midline and opened up the foramen magnum. After expose the dura I opened the dura in a Y fashion the tumor was immediately visualized and noted be highly vascular. So identified and start working around the margins of the tumor in a 360 orientation. The capsule was densely adherent to the neighboring brain around it in addition to the tumor being extremely highly vascular with large feeding vessels. Progressively and developed a plane around the capsule and tried to resect and debulk the large frontal tumor area under microscopic illumination and dissected off of the lateral and  supratentorial wall dissected off the transverse sinus there was some tumor stuck to the torcula that I will had to leave behind. There is also a knuckle of tumor that was extending in the left cerebellar hemisphere that was wrapped around large root artery with a left behind. The remainder the tumor I feel was resected this possibly may be also a small amount of tumor lateral right in inferior where there was a large vessel or tumors wrapped around. But at the end of resection of feel like I had over 90% of the tumor out. Meticulous in a stasis was maintained Surgicel was overlaid in the resection cavity and then the dura was unable to be reapproximated so I patched with DuraGen and Gelfoam. Then closed with layers with interrupted Vicryl and a running nylon. The wound was dressed patient recovered in stable condition. At the end the case all needle counts sponge counts were correct.

## 2017-03-13 NOTE — Anesthesia Procedure Notes (Addendum)
Procedure Name: Intubation Date/Time: 03/13/2017 7:53 AM Performed by: Inda Coke, CRNA Pre-anesthesia Checklist: Patient identified, Emergency Drugs available, Suction available and Patient being monitored Patient Re-evaluated:Patient Re-evaluated prior to induction Oxygen Delivery Method: Circle System Utilized Preoxygenation: Pre-oxygenation with 100% oxygen Induction Type: IV induction Ventilation: Mask ventilation without difficulty Laryngoscope Size: Miller and 2 Grade View: Grade I Tube type: Oral Tube size: 7.5 mm Number of attempts: 1 Airway Equipment and Method: Stylet and Oral airway Placement Confirmation: ETT inserted through vocal cords under direct vision,  positive ETCO2 and breath sounds checked- equal and bilateral Secured at: 23 cm Tube secured with: Tape Dental Injury: Teeth and Oropharynx as per pre-operative assessment  Comments: Intubation done by Girard Cooter, SRNA.

## 2017-03-13 NOTE — Progress Notes (Signed)
Pharmacy called for Keppra 

## 2017-03-13 NOTE — Anesthesia Procedure Notes (Signed)
Arterial Line Insertion Start/End2/25/2019 7:20 AM, 03/13/2017 7:23 AM Performed by: Inda Coke, CRNA, CRNA  Patient location: Pre-op. Preanesthetic checklist: patient identified, IV checked, site marked, risks and benefits discussed, surgical consent, monitors and equipment checked, pre-op evaluation, timeout performed and anesthesia consent Lidocaine 1% used for infiltration and patient sedated Left, radial was placed Catheter size: 20 G Hand hygiene performed  and maximum sterile barriers used  Allen's test indicative of satisfactory collateral circulation Attempts: 2 Procedure performed without using ultrasound guided technique. Ultrasound Notes:anatomy identified Following insertion, dressing applied and Biopatch. Post procedure assessment: normal  Patient tolerated the procedure well with no immediate complications.

## 2017-03-13 NOTE — Anesthesia Postprocedure Evaluation (Signed)
Anesthesia Post Note  Patient: Denise Morton  Procedure(s) Performed: SUBOCCIPITAL CRANIECTOMY  for cerebellar mass (Right Head)     Patient location during evaluation: PACU Anesthesia Type: General Level of consciousness: awake and confused Pain management: pain level controlled Vital Signs Assessment: post-procedure vital signs reviewed and stable Respiratory status: spontaneous breathing, respiratory function stable, patient connected to nasal cannula oxygen and nonlabored ventilation Cardiovascular status: blood pressure returned to baseline Anesthetic complications: no    Last Vitals:  Vitals:   03/13/17 1656 03/13/17 1700  BP: (!) 119/93   Pulse: 92 95  Resp: 19 16  Temp:    SpO2: 96% 95%    Last Pain:  Vitals:   03/13/17 0410  TempSrc: Oral  PainSc:                  Parrish Bonn COKER

## 2017-03-13 NOTE — Progress Notes (Signed)
Patient ID: Denise Morton, female   DOB: 1950/06/05, 67 y.o.   MRN: [id number] Patient is awake alert overall she's doing very well very minimal headache.  Pupils equal extraocular movements are intact strength is 5 out of 5 upper and lower extremities  Patient presents for a suboccipital craniectomy for evacuation of a right-sided cerebellar mass consistent witastatic disease. I have extensively reviewed the risks and benefits of the operation with the patient as well as perioperative course expectations of outcome and alternatives of surgery and she understands and agrees to proceed forward.

## 2017-03-14 ENCOUNTER — Inpatient Hospital Stay (HOSPITAL_COMMUNITY): Payer: PPO

## 2017-03-14 ENCOUNTER — Encounter (HOSPITAL_COMMUNITY): Payer: Self-pay | Admitting: Neurosurgery

## 2017-03-14 LAB — GLUCOSE, CAPILLARY
GLUCOSE-CAPILLARY: 251 mg/dL — AB (ref 65–99)
Glucose-Capillary: 212 mg/dL — ABNORMAL HIGH (ref 65–99)
Glucose-Capillary: 258 mg/dL — ABNORMAL HIGH (ref 65–99)
Glucose-Capillary: 248 mg/dL — ABNORMAL HIGH (ref 65–99)
Glucose-Capillary: 246 mg/dL — ABNORMAL HIGH (ref 65–99)
Glucose-Capillary: 308 mg/dL — ABNORMAL HIGH (ref 65–99)

## 2017-03-14 LAB — BASIC METABOLIC PANEL
Anion gap: 8 (ref 5–15)
BUN: 34 mg/dL — ABNORMAL HIGH (ref 6–20)
CALCIUM: 8.3 mg/dL — AB (ref 8.9–10.3)
CHLORIDE: 114 mmol/L — AB (ref 101–111)
CO2: 21 mmol/L — AB (ref 22–32)
CREATININE: 0.97 mg/dL (ref 0.44–1.00)
GFR calc non Af Amer: 60 mL/min — ABNORMAL LOW (ref >60)
GLUCOSE: 208 mg/dL — AB (ref 65–99)
Potassium: 5.2 mmol/L — ABNORMAL HIGH (ref 3.5–5.1)
Sodium: 143 mmol/L (ref 135–145)

## 2017-03-14 LAB — POCT I-STAT 7, (LYTES, BLD GAS, ICA,H+H)
Acid-Base Excess: 1 mmol/L (ref 0.0–2.0)
Bicarbonate: 24.9 mmol/L (ref 20.0–28.0)
CALCIUM ION: 1.25 mmol/L (ref 1.15–1.40)
HCT: 33 % — ABNORMAL LOW (ref 36.0–46.0)
HEMOGLOBIN: 11.2 g/dL — AB (ref 12.0–15.0)
O2 SAT: 100 %
Patient temperature: 36.6
Potassium: 4.2 mmol/L (ref 3.5–5.1)
SODIUM: 141 mmol/L (ref 135–145)
TCO2: 26 mmol/L (ref 22–32)
pCO2 arterial: 36.6 mmHg (ref 32.0–48.0)
pH, Arterial: 7.44 (ref 7.350–7.450)
pO2, Arterial: 248 mmHg — ABNORMAL HIGH (ref 83.0–108.0)

## 2017-03-14 MED ORDER — INSULIN GLARGINE 100 UNIT/ML ~~LOC~~ SOLN
10.0000 [IU] | Freq: Every day | SUBCUTANEOUS | Status: DC
  Administered 2017-03-14 – 2017-03-15 (×2): 10 [IU] via SUBCUTANEOUS
  Filled 2017-03-14 (×2): qty 0.1

## 2017-03-14 MED ORDER — GADOBENATE DIMEGLUMINE 529 MG/ML IV SOLN
20.0000 mL | Freq: Once | INTRAVENOUS | Status: AC | PRN
  Administered 2017-03-14: 20 mL via INTRAVENOUS

## 2017-03-14 MED ORDER — CHLORHEXIDINE GLUCONATE CLOTH 2 % EX PADS
6.0000 | MEDICATED_PAD | Freq: Every morning | CUTANEOUS | Status: AC
  Administered 2017-03-14 – 2017-03-15 (×2): 6 via TOPICAL

## 2017-03-14 MED ORDER — SODIUM CHLORIDE 0.9 % IV SOLN
INTRAVENOUS | Status: DC
  Administered 2017-03-14 – 2017-03-15 (×2): via INTRAVENOUS

## 2017-03-14 MED FILL — Thrombin For Soln 5000 Unit: CUTANEOUS | Qty: 5000 | Status: AC

## 2017-03-14 MED FILL — Thrombin For Soln 20000 Unit: CUTANEOUS | Qty: 1 | Status: AC

## 2017-03-14 NOTE — Progress Notes (Signed)
Subjective: Patient reports Patient doing well complete a mild headache  Objective: Vital signs in last 24 hours: Temp:  [97.6 F (36.4 C)-98.3 F (36.8 C)] 97.6 F (36.4 C) (02/26 0400) Pulse Rate:  [48-99] 76 (02/26 0600) Resp:  [10-27] 10 (02/26 0700) BP: (87-145)/(62-120) 125/80 (02/26 0700) SpO2:  [92 %-100 %] 98 % (02/26 0700) Arterial Line BP: (127-151)/(74-88) 127/74 (02/25 1745) Weight:  [98.1 kg (216 lb 4.3 oz)] 98.1 kg (216 lb 4.3 oz) (02/26 0500)  Intake/Output from previous day: 02/25 0701 - 02/26 0700 In: 5222.3 [P.O.:30; I.V.:4332.3; IV Piggyback:860] Out: 1975 [YBFXO:3291; Blood:500] Intake/Output this shift: No intake/output data recorded.  Confused but more oriented than she was last night less agitated. Pupils equal exit movements are intact strength 5 out of 5 upper and lower extremities  Lab Results: Recent Labs    03/13/17 0406  03/13/17 1426 03/13/17 1848  WBC 7.3  --   --  12.8*  HGB 14.0   < > 9.9* 10.6*  HCT 43.0   < > 29.0* 33.7*  PLT 187  --   --  175   < > = values in this interval not displayed.   BMET Recent Labs    03/13/17 1848 03/14/17 0421  NA 142 143  K 4.8 5.2*  CL 112* 114*  CO2 17* 21*  GLUCOSE 223* 208*  BUN 30* 34*  CREATININE 1.01* 0.97  CALCIUM 8.1* 8.3*    Studies/Results: No results found.  Assessment/Plan: Postop day 1 craniotomy for resection of right cerebellar mass doing fairly well mobilized today with physical and occupational therapy check follow-up MRI scan of her brain.  LOS: 7 days     Melayah Skorupski P 03/14/2017, 7:34 AM

## 2017-03-14 NOTE — Evaluation (Signed)
Occupational Therapy Re-Evaluation Patient Details Name: Denise Morton MRN: [id number] DOB: Jul 10, 1950 Today's Date: 03/14/2017    History of Present Illness  Suriyah Morton is a 67 y.o. female admitted with weakness and gastritis found to have cerebellar and occipital massess s/p resection 2/25 of right cerebellar mass, pt also with mets to liver, lung and bone. PMHx of Breast CA, DM, central retinal vein occlusion right eye   Clinical Impression   Pt admitted with above. She demonstrates the below listed deficits and will benefit from continued OT to maximize safety and independence with BADLs.  Pt presents to OT with impaired balance, generalized weakness, impaired cognition - she demonstrates intermittent confusion.  She currently requires min - mod A for ADLs and min A for functional mobility.  She reports she lived alone PTA, and was fully independent.  Feel she would benefit from the consistency and intensity of inpatient rehab to allow her to maximize safety and independence as well as reduce risk of falls and readmission.        Follow Up Recommendations  CIR    Equipment Recommendations  3 in 1 bedside commode;Tub/shower seat    Recommendations for Other Services Rehab consult     Precautions / Restrictions Precautions Precautions: Fall Restrictions Weight Bearing Restrictions: No      Mobility Bed Mobility Overal bed mobility: Needs Assistance Bed Mobility: Supine to Sit     Supine to sit: Min guard     General bed mobility comments: cues, increased time, cues to attend to bring both legs off of bed  Transfers Overall transfer level: Needs assistance Equipment used: Rolling walker (2 wheeled) Transfers: Sit to/from Stand Sit to Stand: Min guard Stand pivot transfers: Min assist;+2 safety/equipment       General transfer comment: cues for hand placement, safety. Cues for positioning in midline at chair    Balance Overall balance assessment: Needs  assistance   Sitting balance-Leahy Scale: Fair       Standing balance-Leahy Scale: Poor                             ADL either performed or assessed with clinical judgement   ADL Overall ADL's : Needs assistance/impaired Eating/Feeding: Set up;Sitting   Grooming: Wash/dry hands;Wash/dry face;Oral care;Brushing hair;Minimal assistance;Standing   Upper Body Bathing: Minimal assistance;Sitting   Lower Body Bathing: Moderate assistance;Sit to/from stand   Upper Body Dressing : Moderate assistance;Sitting   Lower Body Dressing: Moderate assistance;Sit to/from stand   Toilet Transfer: Minimal assistance;RW;Comfort height toilet;Ambulation   Toileting- Clothing Manipulation and Hygiene: Minimal assistance;Sit to/from stand       Functional mobility during ADLs: Minimal assistance;+2 for safety/equipment;Rolling walker General ADL Comments: requires assist for cognition and balance      Vision Baseline Vision/History: Wears glasses(h/o central rentinal vein occlusion ) Wears Glasses: At all times Patient Visual Report: No change from baseline Vision Assessment?: Yes Eye Alignment: Within Functional Limits Ocular Range of Motion: Within Functional Limits Alignment/Gaze Preference: Within Defined Limits Tracking/Visual Pursuits: Decreased smoothness of horizontal tracking Visual Fields: No apparent deficits Additional Comments: Pt demonstrates very mild nystagmus with Rt gaze and more pronounced on the Lt at end range      Perception Perception Perception Tested?: Yes   Praxis Praxis Praxis tested?: Within functional limits    Pertinent Vitals/Pain Pain Assessment: No/denies pain     Hand Dominance Right   Extremity/Trunk Assessment Upper Extremity Assessment Upper Extremity Assessment: Generalized weakness(mild  dysmetria bil. UEs )   Lower Extremity Assessment Lower Extremity Assessment: Defer to PT evaluation   Cervical / Trunk Assessment Cervical  / Trunk Assessment: Normal   Communication Communication Communication: Expressive difficulties(word finding deficits )   Cognition Arousal/Alertness: Awake/alert Behavior During Therapy: WFL for tasks assessed/performed Overall Cognitive Status: Impaired/Different from baseline Area of Impairment: Memory;Problem solving;Safety/judgement;Following commands;Orientation                 Orientation Level: Time   Memory: Decreased short-term memory Following Commands: Follows one step commands inconsistently;Follows one step commands with increased time Safety/Judgement: Decreased awareness of safety;Decreased awareness of deficits   Problem Solving: Difficulty sequencing;Requires verbal cues General Comments: pt with difficulty with problem solving and discussing how she sees with 3 eyes, decreased vision, difficulty following commands to use call button, pt aware she was to have surgery but not sure where or when it was   General Comments  VSS     Exercises     Shoulder Instructions      Home Living Family/patient expects to be discharged to:: Private residence Living Arrangements: Alone Available Help at Discharge: Family;Available PRN/intermittently Type of Home: House Home Access: Stairs to enter [employer] of Steps: 1   Home Layout: One level     Bathroom Shower/Tub: Occupational psychologist: Handicapped height     Home Equipment: Kasandra Knudsen - single point      Lives With: Alone    Prior Functioning/Environment Level of Independence: Independent                 OT Problem List: Decreased strength;Decreased activity tolerance;Impaired balance (sitting and/or standing);Impaired vision/perception;Decreased cognition;Decreased safety awareness;Decreased knowledge of use of DME or AE      OT Treatment/Interventions: Self-care/ADL training;Neuromuscular education;DME and/or AE instruction;Therapeutic activities;Cognitive  remediation/compensation;Visual/perceptual remediation/compensation;Balance training;Patient/family education    OT Goals(Current goals can be found in the care plan section) Acute Rehab OT Goals Patient Stated Goal: return home OT Goal Formulation: With patient Time For Goal Achievement: 03/28/17 Potential to Achieve Goals: Good ADL Goals Pt Will Perform Grooming: with supervision;standing Pt Will Perform Upper Body Bathing: with set-up;sitting Pt Will Perform Lower Body Bathing: with supervision;sit to/from stand Pt Will Perform Upper Body Dressing: with supervision;sitting Pt Will Perform Lower Body Dressing: with supervision;sit to/from stand Pt Will Transfer to Toilet: with supervision;ambulating;regular height toilet;bedside commode Pt Will Perform Toileting - Clothing Manipulation and hygiene: with supervision;sit to/from stand Additional ADL Goal #1: Pt will be oriented x 4  OT Frequency: Min 3X/week   Barriers to D/C: Decreased caregiver support          Co-evaluation PT/OT/SLP Co-Evaluation/Treatment: Yes Reason for Co-Treatment: Necessary to address cognition/behavior during functional activity;For patient/therapist safety;To address functional/ADL transfers   OT goals addressed during session: ADL's and self-care;Strengthening/ROM      AM-PAC PT "6 Clicks" Daily Activity     Outcome Measure Help from another person eating meals?: A Little Help from another person taking care of personal grooming?: A Little Help from another person toileting, which includes using toliet, bedpan, or urinal?: A Lot Help from another person bathing (including washing, rinsing, drying)?: A Lot Help from another person to put on and taking off regular upper body clothing?: A Lot Help from another person to put on and taking off regular lower body clothing?: A Lot 6 Click Score: 14   End of Session Equipment Utilized During Treatment: Rolling walker;Gait belt Nurse Communication:  Mobility status  Activity Tolerance: Patient tolerated  treatment well Patient left: in chair;with call bell/phone within reach;with chair alarm set  OT Visit Diagnosis: Unsteadiness on feet (R26.81)                Time: 1132-1207 OT Time Calculation (min): 35 min Charges:  OT General Charges $OT Visit: 1 Visit OT Evaluation $OT Re-eval: 1 Re-eval G-Codes:     [facility], OTR/L [phone]   Lucille Passy M 03/14/2017, 1:36 PM

## 2017-03-14 NOTE — Progress Notes (Signed)
PROGRESS NOTE  Denise Morton PQD:[id number] DOB: 1950-12-30 DOA: 03/07/2017 PCP: Seward Carol, MD   LOS: 7 days   Brief Narrative / Interim history: 67 y.o.femaleWith pmhx of BRCA, DM, admitted on 03/07/17 with weakness, dizziness, anorexia speech concerns and near syncope.  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  She was initially admitted to [facility], neurosurgery was consulted and patient was transferred to [facility] on 2/23 for plan for surgical removal by neurosurgery.  Patient underwent suboccipital craniectomy for resection of right cerebellar met on 2/25 by Dr. Saintclair Halsted  Assessment & Plan: Active Problems:   Syncope   Brain metastases (HCC)  Brain metastasis in the setting of breast cancer -Neurosurgery consulted, she is now status post suboccipital craniotomy and resection of the right cerebellar mass -Radiation oncology consulted, oncology consulted -Continue Decadron per neurosurgery  Type 2 diabetes mellitus -Given Decadron, patient persistently hyperglycemic, will add Lantus starting today at 10 units -Continue scheduled for units with meals plus sliding scale  Hypertension -Blood pressure this morning 137/59, continue current regimen with Norvasc, Coreg  Acute encephalopathy -Seen postop, did require Precedex drip overnight for agitation, will attempt to wean off today -Patient still appears confused this morning, she knows she is at [facility] and the year is 2019 however thinks she is in the hospital because she just had a baby   DVT prophylaxis: SCDs Code Status: Full code Family Communication: No family at bedside Disposition Plan: To be determined  Consultants:   Oncology  Radiation oncology  Neurosurgery  Procedures:  Right cerebellar met resection on 2/25 by Dr. Saintclair Halsted  Antimicrobials:  None    Subjective: -She appears appropriate at times and intermittently confused.  Appears to be emotional  and she is crying this morning.  Denies any pain, she has no nausea or vomiting.  She thinks she was here for having a baby.  Objective: Vitals:   03/14/17 0700 03/14/17 0800 03/14/17 0900 03/14/17 1000  BP: 125/80 (!) 166/63 (!) 137/59 (!) 146/83  Pulse:  91 98   Resp: 10 (!) 22 16 16   Temp:  97.6 F (36.4 C)    TempSrc:  Axillary    SpO2: 98% 97% 100% 100%  Weight:      Height:        Intake/Output Summary (Last 24 hours) at 03/14/2017 1116 Last data filed at 03/14/2017 1000 Gross per 24 hour  Intake 4447.31 ml  Output 1630 ml  Net 2817.31 ml   Filed Weights   03/12/17 0500 03/13/17 0500 03/14/17 0500  Weight: 96.5 kg (212 lb 11.9 oz) 97.5 kg (214 lb 15.2 oz) 98.1 kg (216 lb 4.3 oz)    Examination:  Constitutional: No distress Eyes: No scleral icterus ENMT: Moist mucous membranes Respiratory: Clear to auscultation, no wheezing, no crackles, normal respiratory effort  Cardiovascular: Regular rate and rhythm without murmurs.  No edema. Abdomen: Soft, nontender, nondistended, bowel sounds are positive Skin: No rashes seen Neurologic: Moves all 4 extremities without weakness   Data Reviewed: I have independently reviewed following labs and imaging studies   CBC: Recent Labs  Lab 03/07/17 1336 03/08/17 0347 03/11/17 0620 03/13/17 0406 03/13/17 0837 03/13/17 1207 03/13/17 1426 03/13/17 1848  WBC 8.5 4.7 7.2 7.3  --   --   --  12.8*  NEUTROABS 6.6  --   --   --   --   --   --   --   HGB 14.5 11.6* 14.6 14.0  13.3 11.2* 9.9* 10.6*  HCT 44.2 34.6* 43.7 43.0 39.0 33.0* 29.0* 33.7*  MCV 94.6 94.0 92.2 94.7  --   --   --  96.3  PLT 206 157 182 187  --   --   --  175   Basic Metabolic Panel: Recent Labs  Lab 03/08/17 0347 03/10/17 1718 03/13/17 0406 03/13/17 0837 03/13/17 1207 03/13/17 1426 03/13/17 1848 03/14/17 0421  NA 141 135 136 139 141 140 142 143  K 3.8 5.2* 4.8 3.9 4.2 4.5 4.8 5.2*  CL 114* 100* 102  --   --   --  112* 114*  CO2 19* 23 22  --   --    --  17* 21*  GLUCOSE 176* 224* 286* 230*  --   --  223* 208*  BUN 10 22* 32*  --   --   --  30* 34*  CREATININE 0.65 0.79 0.90  --   --   --  1.01* 0.97  CALCIUM 7.8* 9.4 9.3  --   --   --  8.1* 8.3*   GFR: Estimated Creatinine Clearance: 68.6 mL/min (by C-G formula based on SCr of 0.97 mg/dL). Liver Function Tests: Recent Labs  Lab 03/10/17 1718  AST 34  ALT 35  ALKPHOS 62  BILITOT 1.2  PROT 6.6  ALBUMIN 3.3*   No results for input(s): LIPASE, AMYLASE in the last 168 hours. No results for input(s): AMMONIA in the last 168 hours. Coagulation Profile: No results for input(s): INR, PROTIME in the last 168 hours. Cardiac Enzymes: No results for input(s): CKTOTAL, CKMB, CKMBINDEX, TROPONINI in the last 168 hours. BNP (last 3 results) No results for input(s): PROBNP in the last 8760 hours. HbA1C: No results for input(s): HGBA1C in the last 72 hours. CBG: Recent Labs  Lab 03/13/17 1509 03/13/17 1612 03/13/17 2140 03/14/17 0748 03/14/17 1009  GLUCAP 97 98 273* 212* 251*   Lipid Profile: No results for input(s): CHOL, HDL, LDLCALC, TRIG, CHOLHDL, LDLDIRECT in the last 72 hours. Thyroid Function Tests: No results for input(s): TSH, T4TOTAL, FREET4, T3FREE, THYROIDAB in the last 72 hours. Anemia Panel: No results for input(s): VITAMINB12, FOLATE, FERRITIN, TIBC, IRON, RETICCTPCT in the last 72 hours. Urine analysis: No results found for: COLORURINE, APPEARANCEUR, LABSPEC, PHURINE, GLUCOSEU, HGBUR, BILIRUBINUR, KETONESUR, PROTEINUR, UROBILINOGEN, NITRITE, LEUKOCYTESUR Sepsis Labs: Invalid input(s): PROCALCITONIN, LACTICIDVEN  Recent Results (from the past 240 hour(s))  Surgical PCR screen     Status: Abnormal   Collection Time: 03/11/17 11:03 AM  Result Value Ref Range Status   MRSA, PCR NEGATIVE NEGATIVE Final   Staphylococcus aureus POSITIVE (A) NEGATIVE Final    Comment: (NOTE) The Xpert SA Assay (FDA approved for NASAL specimens in patients 74 years of age and  older), is one component of a comprehensive surveillance program. It is not intended to diagnose infection nor to guide or monitor treatment. Performed at [facility], [place] [address], [place], [place] [address]   MRSA PCR Screening     Status: None   Collection Time: 03/11/17  5:10 PM  Result Value Ref Range Status   MRSA by PCR NEGATIVE NEGATIVE Final    Comment:        The GeneXpert MRSA Assay (FDA approved for NASAL specimens only), is one component of a comprehensive MRSA colonization surveillance program. It is not intended to diagnose MRSA infection nor to guide or monitor treatment for MRSA infections. Performed at [facility], [place],  [place] [address]       Radiology Studies: No results found.   Scheduled Meds: . amLODipine  10 mg Oral Daily  . carvedilol  12.5 mg Oral BID WC  . chlorhexidine  15 mL Mouth Rinse BID  . Chlorhexidine Gluconate Cloth  6 each Topical q morning - 10a  . dexamethasone  10 mg Intravenous Q6H   Followed by  . dexamethasone  4 mg Intravenous Q6H   Followed by  . [START ON 03/15/2017] dexamethasone  4 mg Intravenous Q8H  . dexamethasone  4 mg Oral Q12H  . docusate sodium  100 mg Oral BID  . feeding supplement (ENSURE ENLIVE)  237 mL Oral BID BM  . insulin aspart  0-20 Units Subcutaneous TID WC  . insulin aspart  4 Units Subcutaneous TID WC  . insulin glargine  10 Units Subcutaneous QHS  . mouth rinse  15 mL Mouth Rinse q12n4p  . mirtazapine  15 mg Oral QHS  . mupirocin ointment  1 application Nasal BID  . nystatin-triamcinolone   Topical BID  . pantoprazole  40 mg Oral Daily  . pantoprazole (PROTONIX) IV  40 mg Intravenous QHS   Continuous Infusions: . sodium chloride 75 mL/hr at 03/14/17 1025  . dexmedetomidine (PRECEDEX) IV infusion Stopped (03/14/17 0645)  . levETIRAcetam Stopped (03/14/17 0554)     Marzetta Board, MD, PhD [facility] Pager [phone]  If 7PM-7AM,  please contact night-coverage [email or web address] Password [facility] 03/14/2017, 11:16 AM

## 2017-03-14 NOTE — Progress Notes (Signed)
Inpatient Diabetes Program Recommendations  AACE/ADA: New Consensus Statement on Inpatient Glycemic Control (2015)  Target Ranges:  Prepandial:   less than 140 mg/dL      Peak postprandial:   less than 180 mg/dL (1-2 hours)      Critically ill patients:  140 - 180 mg/dL   Lab Results  Component Value Date   GLUCAP 212 (H) 03/14/2017    Review of Glycemic Control  Diabetes history: DM2 Outpatient Diabetes medications: None Current orders for Inpatient glycemic control: Novolog 0-20 units tidwc + 4 units tidwc No HgbA1C available. Blood sugars in 200s today and yesterday. Will likely need some basal insulin.  Inpatient Diabetes Program Recommendations:     Add Lantus 12 units QHS Add Novolog HS correction   Will follow closely.  Thank you. Lorenda Peck, RD, LDN, CDE Inpatient Diabetes Coordinator [phone]

## 2017-03-14 NOTE — Progress Notes (Signed)
Rehab Admissions Coordinator Note:  Patient was screened by Cleatrice Burke for appropriateness for an Inpatient Acute Rehab Consult per PT recommendation.  At this time, we are recommending Inpatient Rehab consult. Please place order for evaluation.   Cleatrice Burke 03/14/2017, 12:18 PM  I can be reached at [phone].

## 2017-03-14 NOTE — Consult Note (Addendum)
Physical Medicine and Rehabilitation Consult Reason for Consult: Decreased functional mobility Referring Physician: Dr. Saintclair Halsted   HPI: Denise Morton is a 67 y.o. 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 Per chart review and patient, patient lives in one level home one step to entry. Independent prior to admission. Family that live in the area works. Presented 03/07/2017 with noted progressive headaches and slurred speech decrease in balance. Workup with CT and imaging showed a right cerebellar mass as well as smaller 2 mm right occipital mass. Placed on IV Decadron. Underwent suboccipital craniectomy for resection of right cerebellar met 03/13/2017 per Dr. Saintclair Halsted. [facility] course pain management. Radiation oncology follow-up and awaiting final plan of care after surgical intervention. MRI brain reviewed, showing tumor resection, with additional mets.  Patient notes to have drainage from incision, being followed by NS. Patient remains on Decadron protocol. Maintained on a regular diet. Physical therapy evaluation completed with recommendations of physical medicine rehabilitation consult   Review of Systems  Constitutional: Negative for chills and fever.  HENT: Negative for hearing loss.   Eyes: Negative for blurred vision and double vision.  Respiratory: Positive for shortness of breath. Negative for cough.   Cardiovascular: Negative for chest pain and palpitations.  Gastrointestinal: Positive for constipation. Negative for nausea and vomiting.  Genitourinary: Negative for dysuria, flank pain and hematuria.  Musculoskeletal: Positive for joint pain and myalgias.  Skin: Negative for rash.  Neurological: Positive for dizziness, weakness and headaches. Negative for seizures.  All other systems reviewed and are negative.  Past Medical History:    Diagnosis Date  . CRVO (central retinal vein occlusion)   . Diabetes mellitus without complication ([place])   . Metastatic cancer ([place]) dx'd 07/2014   liver, lung and bone  . right breast ca dx'd 07/2014   Past Surgical History:  Procedure Laterality Date  . SUBOCCIPITAL CRANIECTOMY CERVICAL LAMINECTOMY Right 03/13/2017   Procedure: SUBOCCIPITAL CRANIECTOMY  for cerebellar mass;  Surgeon: Kary Kos, MD;  Location: [facility];  Service: Neurosurgery;  Laterality: Right;  right occipital  . TONSILLECTOMY    . TUBAL LIGATION    . TUMOR REMOVAL     benign- 2 on leg, 1 in finger   Family History  Problem Relation Age of Onset  . Heart failure Father    Social History:  reports that she has quit smoking. She has a 15.00 pack-year smoking history. she has never used smokeless tobacco. She reports that she does not drink alcohol or use drugs. Allergies:  Allergies  Allergen Reactions  . Penicillin G Rash    Has patient had a PCN reaction causing immediate rash, facial/tongue/throat swelling, SOB or lightheadedness with hypotension: Unknown Has patient had a PCN reaction causing severe rash involving mucus membranes or skin necrosis: Unknown Has patient had a PCN reaction that required hospitalization: Unknown Has patient had a PCN reaction occurring within the last 10 years: Unknown If all of the above answers are "NO", then may proceed with Cephalosporin use.   . Sulfa Antibiotics Rash   Medications Prior to Admission  Medication Sig Dispense Refill  . carvedilol (COREG) 12.5 MG tablet Take 1 tablet (12.5 mg total) by mouth 2 (two) times daily with a meal. 60 tablet 0  . gabapentin (NEURONTIN) 300 MG capsule TAKE ONE CAPSULE BY MOUTH THREE TIMES DAILY 90 capsule 6  . lidocaine-prilocaine (EMLA) cream APPLY  TO THE AFFECTED AREA ONCE AS DIRECTED 30 g 0  . loperamide (IMODIUM) 2 MG capsule Take 2 mg by mouth as needed for diarrhea or loose stools.    . Multiple Vitamin (MULTIVITAMIN WITH  MINERALS) TABS tablet Take 1 tablet by mouth daily.    . ondansetron (ZOFRAN) 8 MG tablet Take 1 tablet (8 mg total) by mouth every 8 (eight) hours as needed for nausea. 30 tablet 3  . prochlorperazine (COMPAZINE) 10 MG tablet TAKE 1 TABLET(10 MG) BY MOUTH EVERY 6 HOURS AS NEEDED FOR NAUSEA OR VOMITING 385 tablet 3    Home: Home Living Family/patient expects to be discharged to:: Private residence Living Arrangements: Alone Available Help at Discharge: Family, Available PRN/intermittently Type of Home: House Home Access: Stairs to enter Technical brewer of Steps: 1 Home Layout: One level Bathroom Shower/Tub: Multimedia programmer: Handicapped height Home Equipment: Radio producer - single point  Lives With: Alone  Functional History: Prior Function Level of Independence: Independent Functional Status:  Mobility: Bed Mobility Overal bed mobility: Needs Assistance Bed Mobility: Supine to Sit Supine to sit: Min guard General bed mobility comments: cues, increased time, cues to attend to bring both legs off of bed Transfers Overall transfer level: Needs assistance Equipment used: Rolling walker (2 wheeled) Transfers: Sit to/from Stand Sit to Stand: Min guard Stand pivot transfers: Min assist, +2 safety/equipment General transfer comment: cues for hand placement, safety. Cues for positioning in midline at chair Ambulation/Gait Ambulation/Gait assistance: Min assist Ambulation Distance (Feet): 150 Feet Assistive device: Rolling walker (2 wheeled) Gait Pattern/deviations: Step-through pattern General Gait Details: assist for direction and balance with increased safety with RW Gait velocity interpretation: Below normal speed for age/gender    ADL: ADL Overall ADL's : Needs assistance/impaired Eating/Feeding: Set up, Sitting Grooming: Wash/dry hands, Wash/dry face, Oral care, Brushing hair, Minimal assistance, Standing Upper Body Bathing: Minimal assistance,  Sitting Lower Body Bathing: Moderate assistance, Sit to/from stand Upper Body Dressing : Moderate assistance, Sitting Lower Body Dressing: Moderate assistance, Sit to/from stand Toilet Transfer: Minimal assistance, RW, Comfort height toilet, Ambulation Toileting- Clothing Manipulation and Hygiene: Minimal assistance, Sit to/from stand Functional mobility during ADLs: Minimal assistance, +2 for safety/equipment, Rolling walker General ADL Comments: requires assist for cognition and balance   Cognition: Cognition Overall Cognitive Status: Impaired/Different from baseline Orientation Level: Oriented to person, Oriented to situation, Disoriented to place, Disoriented to time Safety/Judgment: Appears intact Cognition Arousal/Alertness: Awake/alert Behavior During Therapy: WFL for tasks assessed/performed Overall Cognitive Status: Impaired/Different from baseline Area of Impairment: Memory, Problem solving, Safety/judgement, Following commands, Orientation Orientation Level: Time Memory: Decreased short-term memory Following Commands: Follows one step commands inconsistently, Follows one step commands with increased time Safety/Judgement: Decreased awareness of safety, Decreased awareness of deficits Problem Solving: Difficulty sequencing, Requires verbal cues General Comments: pt with difficulty with problem solving and discussing how she sees with 3 eyes, decreased vision, difficulty following commands to use call button, pt aware she was to have surgery but not sure where or when it was  Blood pressure 135/76, pulse 66, temperature 98 F (36.7 C), temperature source Oral, resp. rate 16, height _0  (1.702 m), weight 100.3 kg (221 lb 1.9 oz), SpO2 98 %. Physical Exam  Vitals reviewed. Constitutional: She is oriented to person, place, and time. She appears well-developed.  Obese  HENT:  Head: Normocephalic.  Eyes: EOM are normal. Right eye exhibits no discharge. Left eye exhibits no  discharge.  Neck: Normal range of motion. Neck supple. No thyromegaly present.  Cardiovascular: Normal rate, regular rhythm and normal heart sounds.  Respiratory: Effort normal and breath sounds normal.  +[place]  GI: Soft. Bowel sounds are normal. She exhibits no distension.  Musculoskeletal:  No edema or tenderness in extremities  Neurological: She is alert and oriented to person, place, and time.  Follows simple commands Motor: B/l UE: 4+/5 proximal to distal B/l LE: HF 4/5, distally 4+/5  Skin: Skin is warm and dry.  Psychiatric: She has a normal mood and affect. Her behavior is normal.    Results for orders placed or performed during the hospital encounter of 03/07/17 (from the past 24 hour(s))  Glucose, capillary     Status: Abnormal   Collection Time: 03/14/17  6:22 PM  Result Value Ref Range   Glucose-Capillary 246 (H) 65 - 99 mg/dL  Glucose, capillary     Status: Abnormal   Collection Time: 03/14/17  9:16 PM  Result Value Ref Range   Glucose-Capillary 308 (H) 65 - 99 mg/dL  Glucose, capillary     Status: Abnormal   Collection Time: 03/15/17  7:50 AM  Result Value Ref Range   Glucose-Capillary 209 (H) 65 - 99 mg/dL   Comment 1 Notify RN    Comment 2 Document in Chart   Glucose, capillary     Status: Abnormal   Collection Time: 03/15/17 11:52 AM  Result Value Ref Range   Glucose-Capillary 230 (H) 65 - 99 mg/dL   Mr Jeri Cos Wo Contrast  Result Date: 03/14/2017 CLINICAL DATA:  Metastatic brain cancer. Breast cancer. Craniotomy for cerebellar metastasis yesterday. EXAM: MRI HEAD WITHOUT AND WITH CONTRAST TECHNIQUE: Multiplanar, multiecho pulse sequences of the brain and surrounding structures were obtained without and with intravenous contrast. CONTRAST:  20 mL MultiHance COMPARISON:  MRI brain 03/07/2017 FINDINGS: Brain: Interval craniotomy is noted. There is a subtotal resection tumor in the cerebellum. Significant debulking is present. Residual enhancing margin extends up to  10 mm ventral to the resection cavity. Residual enhancement is also extends medially to involve the inferior cerebellar vermis. Previously noted mass effect is markedly improved. Fourth ventricle is restored. Lateral and third ventricular size have decreased. Thickening of the right tentorium likely reflects tumor infiltration. A 2 mm focus of enhancement in the right occipital lobe is again suggested. Today's study is mildly degraded by patient motion. Periventricular T2 changes in mild atrophy are otherwise stable. No acute infarct is present following surgery. Vascular: Flow is present in the major intracranial arteries. Skull and upper cervical spine: The skull base is otherwise normal. The craniocervical junction is intact. Cerebellar tonsils no longer extend into the foramen magnum. Sinuses/Orbits: The paranasal sinuses are clear. Fluid is present in the inferior mastoid air cells bilaterally. Globes and orbits are intact. IMPRESSION: 1. Interval craniotomy significant debulking of cerebellar tumor reduction in mass effect. 2. Stable punctate 2 mm enhancing lesion in the right occipital pole suggesting additional metastasis. 3. Decreased mass effect on the fourth ventricle and decreased size of the lateral and third ventricles. 4. Stable atrophy and white matter disease. Electronically Signed   By: San Morelle M.D.   On: 03/14/2017 18:28    Assessment/Plan: Diagnosis: Metastatic brain CA Labs and images independently reviewed.  Records reviewed and summated above.  1. Does the need for close, 24 hr/day medical supervision in concert with the patient's rehab needs make it unreasonable for this patient to be served in a less intensive setting? Potentially  2. Co-Morbidities requiring supervision/potential complications: central retinal vein occlusion, steroid induced  hyperglycemia on diabetes mellitus (Monitor in accordance with exercise and adjust meds as necessary), invasive breast carcinoma  with metastatic disease, post-op pain management (Biofeedback training with therapies to help reduce reliance on opiate pain medications, particularly IV dilaudid, monitor pain control during therapies, and sedation at rest and titrate to maximum efficacy to ensure participation and gains in therapies), Hyperkalemia (cont to monitor, treat if necessary), leukocytosis (cont to monitor for signs and symptoms of infection, further workup if indicated), ABLA (transfuse if necessary to ensure appropriate perfusion for increased activity tolerance), obesity (encourage weight loss) 3. Due to safety, skin/wound care, disease management, pain management and patient education, does the patient require 24 hr/day rehab nursing? Potentially 4. Does the patient require coordinated care of a physician, rehab nurse, PT (1-2 hrs/day, 5 days/week) and OT (1-2 hrs/day, 5 days/week) to address physical and functional deficits in the context of the above medical diagnosis(es)? Potentially Addressing deficits in the following areas: balance, endurance, locomotion, strength, transferring, bathing, dressing, toileting and psychosocial support 5. Can the patient actively participate in an intensive therapy program of at least 3 hrs of therapy per day at least 5 days per week? Yes 6. The potential for patient to make measurable gains while on inpatient rehab is good 7. Anticipated functional outcomes upon discharge from inpatient rehab are supervision  with PT, supervision with OT, n/a with SLP. 8. Estimated rehab length of stay to reach the above functional goals is: 4-7 days. 9. Anticipated D/C setting: Home 10. Anticipated post D/C treatments: HH therapy and Home excercise program 11. Overall Rehab/Functional Prognosis: fair and poor  RECOMMENDATIONS: This patient's condition is appropriate for continued rehabilitative care in the following setting: Pt doing well on day of eval.  She will likely not require CIR once medical  issues resolved.  Will cont to follow after medically stable and final plans per Rad/Onc to determine if deficits persist. Patient has agreed to participate in recommended program. Potentially Note that insurance prior authorization may be required for reimbursement for recommended care.  Comment: Rehab Admissions Coordinator to follow up.  Delice Lesch, MD, ABPMR 03/15/2017 Lavon Paganini Angiulli, PA-C 03/14/2017

## 2017-03-14 NOTE — Progress Notes (Signed)
Physical Therapy Treatment Patient Details Name: Denise Morton MRN: [id number] DOB: 07-Feb-1950 Today's Date: 03/14/2017    History of Present Illness  Denise Morton is a 67 y.o. female admitted with weakness and gastritis found to have cerebellar and occipital massess s/p resection 2/25 of right cerebellar mass, pt also with mets to liver, lung and bone. PMHx of Breast CA, DM, central retinal vein occlusion right eye    PT Comments    Pt moving well for given surgery but continues to have balance deficits with noted cognitive deficits. Pt with decreased problem solving, awareness, orientation and functional mobility. Pt continues to benefit from acute therapy with need for increased functional mobility, balance and awareness. Pt was living alone and would benefit from CIR to maximize independence.    Follow Up Recommendations  CIR;Supervision/Assistance - 24 hour     Equipment Recommendations  Rolling walker with 5" wheels    Recommendations for Other Services       Precautions / Restrictions Precautions Precautions: Fall Restrictions Weight Bearing Restrictions: No    Mobility  Bed Mobility Overal bed mobility: Needs Assistance Bed Mobility: Supine to Sit     Supine to sit: Min guard     General bed mobility comments: cues, increased time, cues to attend to bring both legs off of bed  Transfers Overall transfer level: Needs assistance   Transfers: Sit to/from Stand Sit to Stand: Min guard         General transfer comment: cues for hand placement, safety. Cues for positioning in midline at chair  Ambulation/Gait Ambulation/Gait assistance: Min assist Ambulation Distance (Feet): 150 Feet Assistive device: Rolling walker (2 wheeled) Gait Pattern/deviations: Step-through pattern   Gait velocity interpretation: Below normal speed for age/gender General Gait Details: assist for direction and balance with increased safety with RW   Stairs             Wheelchair Mobility    Modified Rankin (Stroke Patients Only)       Balance Overall balance assessment: Needs assistance   Sitting balance-Leahy Scale: Fair       Standing balance-Leahy Scale: Poor                              Cognition Arousal/Alertness: Awake/alert Behavior During Therapy: WFL for tasks assessed/performed Overall Cognitive Status: Impaired/Different from baseline Area of Impairment: Memory;Problem solving;Safety/judgement;Following commands;Orientation                 Orientation Level: Time   Memory: Decreased short-term memory Following Commands: Follows one step commands inconsistently;Follows one step commands with increased time Safety/Judgement: Decreased awareness of safety;Decreased awareness of deficits   Problem Solving: Difficulty sequencing;Requires verbal cues General Comments: pt with difficulty with problem solving and discussing how she sees with 3 eyes, decreased vision, difficulty following commands to use call button, pt aware she was to have surgery but not sure where or when it was      Exercises      General Comments        Pertinent Vitals/Pain Pain Assessment: No/denies pain    Home Living                      Prior Function            PT Goals (current goals can now be found in the care plan section) Acute Rehab PT Goals Patient Stated Goal: return home PT Goal Formulation: With patient  Time For Goal Achievement: 03/28/17 Potential to Achieve Goals: Fair Progress towards PT goals: Progressing toward goals    Frequency    Min 3X/week      PT Plan Current plan remains appropriate    Co-evaluation PT/OT/SLP Co-Evaluation/Treatment: Yes Reason for Co-Treatment: Complexity of the patient's impairments (multi-system involvement);Necessary to address cognition/behavior during functional activity          AM-PAC PT "6 Clicks" Daily Activity  Outcome Measure  Difficulty  turning over in bed (including adjusting bedclothes, sheets and blankets)?: A Little Difficulty moving from lying on back to sitting on the side of the bed? : A Little Difficulty sitting down on and standing up from a chair with arms (e.g., wheelchair, bedside commode, etc,.)?: A Little Help needed moving to and from a bed to chair (including a wheelchair)?: A Little Help needed walking in hospital room?: A Little Help needed climbing 3-5 steps with a railing? : A Lot 6 Click Score: 17    End of Session Equipment Utilized During Treatment: Gait belt Activity Tolerance: Patient tolerated treatment well Patient left: in chair;with call bell/phone within reach;with chair alarm set Nurse Communication: Mobility status;Precautions PT Visit Diagnosis: Unsteadiness on feet (R26.81);Difficulty in walking, not elsewhere classified (R26.2)     Time: 7121-9758 PT Time Calculation (min) (ACUTE ONLY): 33 min  Charges:                       G Codes:       Elwyn Reach, PT [phone]    [place] B Verlon Pischke 03/14/2017, 12:13 PM

## 2017-03-14 NOTE — Progress Notes (Signed)
Late entry:  Attempted to see patient x2, however, pt down for MRI. Will follow up tomorrow with CIR consult.  Delice Lesch, MD, ABPMR

## 2017-03-14 NOTE — Progress Notes (Signed)
Nutrition Follow-up  DOCUMENTATION CODES:   Obesity unspecified  INTERVENTION:   Continue Ensure Enlive po BID, each supplement provides 350 kcal and 20 grams of protein  NUTRITION DIAGNOSIS:   Increased nutrient needs related to chronic illness, cancer and cancer related treatments as evidenced by estimated needs. Ongoing.   GOAL:   Patient will meet greater than or equal to 90% of their needs Progressing.   MONITOR:   PO intake, Labs, Supplement acceptance, Weight trends, I & O's  ASSESSMENT:   Pt with PMH of metastatic breast cancer, gastritis, and DM presents with weakness s/p syncope  Pt discussed during ICU rounds and with RN.   2/25 s/p suboccipital craniotomy and resection of the right cerebellar mass  Pt started on lantus due to continued steriods Pt confused this am per RN. She is unable to remember during my visit if she had consumed breakfast. She does report loving the ensure supplements.  Per chart review pt was consuming 100% of her meals until 2 days ago at which time she has had decreased intake.   Medications reviewed and include: decadron, colace, SSI, novolog 4 units TID with meals, lantus 10 units at bedtime, remeron Labs reviewed: K+ 5.2 (H) CBG's: [phone]  Diet Order:  Diet Carb Modified Fluid consistency: Thin; Room service appropriate? Yes  EDUCATION NEEDS:   Education needs have been addressed  Skin:  Skin Assessment: Reviewed RN Assessment  Last BM:  2/22  Height:   Ht Readings from Last 1 Encounters:  03/07/17 5\' 7"  (1.702 m)    Weight:   Wt Readings from Last 1 Encounters:  03/14/17 216 lb 4.3 oz (98.1 kg)    Ideal Body Weight:  61.4 kg  BMI:  Body mass index is 33.87 kg/m.  Estimated Nutritional Needs:   Kcal:  2020-2263  Protein:  110-120 grams  Fluid:  >/= 2 L/d  Maylon Peppers RD, LDN, CNSC [phone] Pager [phone] After Hours Pager

## 2017-03-14 NOTE — Progress Notes (Signed)
Patient on precedex gtt throughout night.  When turned off, she repeatedly yells "she wants to go home and doesn't want to be tortured anymore." She tries to climb out of bed and pulls everything off so restraints remained on.  MAE strong and purposefully, FC, and remains confused.

## 2017-03-15 DIAGNOSIS — G8918 Other acute postprocedural pain: Secondary | ICD-10-CM

## 2017-03-15 DIAGNOSIS — R739 Hyperglycemia, unspecified: Secondary | ICD-10-CM

## 2017-03-15 DIAGNOSIS — E669 Obesity, unspecified: Secondary | ICD-10-CM

## 2017-03-15 DIAGNOSIS — E875 Hyperkalemia: Secondary | ICD-10-CM

## 2017-03-15 DIAGNOSIS — D72829 Elevated white blood cell count, unspecified: Secondary | ICD-10-CM

## 2017-03-15 DIAGNOSIS — E1169 Type 2 diabetes mellitus with other specified complication: Secondary | ICD-10-CM

## 2017-03-15 DIAGNOSIS — G936 Cerebral edema: Secondary | ICD-10-CM

## 2017-03-15 DIAGNOSIS — D62 Acute posthemorrhagic anemia: Secondary | ICD-10-CM

## 2017-03-15 DIAGNOSIS — C799 Secondary malignant neoplasm of unspecified site: Secondary | ICD-10-CM

## 2017-03-15 DIAGNOSIS — T380X5A Adverse effect of glucocorticoids and synthetic analogues, initial encounter: Secondary | ICD-10-CM

## 2017-03-15 DIAGNOSIS — C7931 Secondary malignant neoplasm of brain: Principal | ICD-10-CM

## 2017-03-15 LAB — GLUCOSE, CAPILLARY
Glucose-Capillary: 209 mg/dL — ABNORMAL HIGH (ref 65–99)
Glucose-Capillary: 230 mg/dL — ABNORMAL HIGH (ref 65–99)
Glucose-Capillary: 175 mg/dL — ABNORMAL HIGH (ref 65–99)
Glucose-Capillary: 290 mg/dL — ABNORMAL HIGH (ref 65–99)

## 2017-03-15 MED ORDER — SODIUM CHLORIDE 0.9% FLUSH
10.0000 mL | INTRAVENOUS | Status: DC | PRN
  Administered 2017-04-14: 10 mL
  Filled 2017-03-15: qty 40

## 2017-03-15 MED ORDER — SODIUM CHLORIDE 0.9% FLUSH
10.0000 mL | Freq: Two times a day (BID) | INTRAVENOUS | Status: DC
  Administered 2017-03-17 – 2017-03-28 (×15): 10 mL
  Administered 2017-03-28: 20 mL
  Administered 2017-03-29 – 2017-04-01 (×5): 10 mL
  Administered 2017-04-01: 20 mL
  Administered 2017-04-02 – 2017-04-03 (×3): 10 mL
  Administered 2017-04-04 – 2017-04-05 (×2): 20 mL
  Administered 2017-04-06: 10 mL
  Administered 2017-04-07: 20 mL
  Administered 2017-04-07 – 2017-04-10 (×4): 10 mL
  Administered 2017-04-11: 20 mL
  Administered 2017-04-11 – 2017-04-14 (×4): 10 mL

## 2017-03-15 MED ORDER — LEVETIRACETAM 500 MG PO TABS
500.0000 mg | ORAL_TABLET | Freq: Two times a day (BID) | ORAL | Status: DC
  Administered 2017-03-15 – 2017-03-22 (×14): 500 mg via ORAL
  Filled 2017-03-15 (×14): qty 1

## 2017-03-15 MED ORDER — DEXAMETHASONE SODIUM PHOSPHATE 4 MG/ML IJ SOLN
10.0000 mg | Freq: Four times a day (QID) | INTRAMUSCULAR | Status: DC
  Administered 2017-03-15 – 2017-03-17 (×7): 10 mg via INTRAVENOUS
  Filled 2017-03-15 (×8): qty 3

## 2017-03-15 NOTE — Progress Notes (Signed)
Patient ID: Denise Morton, female   DOB: 14-May-1950, 67 y.o.   MRN: [id number] Patient with some serous drainage of the inferior aspect of her incision is possible this could just be seromatous fluid however is also possible it could be spinal fluid. I put some Dermabond on the incision reinforced with the patient back to bed. We'll inspect in the morning if continue drainage will try reinforced with suture if that doesn't work patient need may need to go back to the OR for reexploration and primary repair.

## 2017-03-15 NOTE — Progress Notes (Signed)
NEUROSURGERY PROGRESS NOTE  Doing well. Complains of appropriate headaches Ambulating with PT Good strength and sensation Incision CDI  Temp:  [97.6 F (36.4 C)-98 F (36.7 C)] 97.7 F (36.5 C) (02/27 0400) Pulse Rate:  [60-99] 71 (02/27 0600) Resp:  [8-23] 13 (02/27 0600) BP: (121-166)/(59-92) 145/86 (02/27 0600) SpO2:  [94 %-100 %] 99 % (02/27 0600) Weight:  [100.3 kg (221 lb 1.9 oz)] 100.3 kg (221 lb 1.9 oz) (02/27 0500)  Plan: Doing well, alert and oriented at this time. Minimal drainage from site. Will transfer to [place], NP 03/15/2017 7:54 AM

## 2017-03-15 NOTE — Progress Notes (Signed)
I left a message for the patient today on her cell phone to let her know we discussed her case in our conference. We are leaning toward treatment to her post surgical resection with either conventional radiotherapy to the posterior fossa versus postoperative SRS. We will need to see her back about 2 weeks after her surgery to discuss this further.     Carola Rhine, PAC

## 2017-03-15 NOTE — Progress Notes (Signed)
PROGRESS NOTE    Denise Morton  EHM:[id number] DOB: 1950/02/17 DOA: 03/07/2017 PCP: Seward Carol, MD   Brief Narrative:67 y.o.femaleWith pmhx of BRCA, DM, admitted on 03/07/17 with weakness, dizziness, anorexia speech concerns and near syncope. 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  She was initially admitted to [facility], neurosurgery was consulted and patient was transferred to [facility] on 2/23 for plan for surgical removal by neurosurgery.  Patient underwent suboccipital craniectomy for resection of right cerebellar met on 2/25 by Dr. Saintclair Halsted    Assessment & Plan:   Active Problems:   Syncope   Brain metastases (HCC) Brain metastasis in the setting of breast cancer -Neurosurgery consulted, she is now status post suboccipital craniotomy and resection of the right cerebellar mass -Radiation oncology consulted, oncology consulted -Continue Decadron per neurosurgery  Type 2 diabetes mellitus -Given Decadron, patient persistently hyperglycemic, will add Lantus starting today at 10 units -Continue scheduled for units with meals plus sliding scale  Hypertension -Blood pressure this morning 137/59, continue current regimen with Norvasc, Coreg  Acute encephalopathy-resolving  DVT prophylaxis:scd Code Status:full Family Communication:none Disposition Plan to cir  Consultants: Oncology, radiation oncology, neurosurgery  Procedures: Right cerebellar met resection on 225 Antimicrobials: None  Subjective: She is awake alert talking appropriately.   Objective: Vitals:   03/15/17 0900 03/15/17 0915 03/15/17 1026 03/15/17 1150  BP: 93/66 118/83 126/80 (!) 143/69  Pulse: 79 76 68 63  Resp: _0 Temp:   97.7 F (36.5 C) 98 F (36.7 C)  TempSrc:   Oral Oral  SpO2: 99% 99% 95% 100%  Weight:      Height:        Intake/Output Summary (Last 24 hours) at 03/15/2017 1211 Last data filed at 03/15/2017 0900 Gross per  24 hour  Intake 1920 ml  Output 2030 ml  Net -110 ml   Filed Weights   03/13/17 0500 03/14/17 0500 03/15/17 0500  Weight: 97.5 kg (214 lb 15.2 oz) 98.1 kg (216 lb 4.3 oz) 100.3 kg (221 lb 1.9 oz)    Examination:  General exam: Appears calm and comfortable  Respiratory system: Clear to auscultation. Respiratory effort normal. Cardiovascular system: S1 & S2 heard, RRR. No JVD, murmurs, rubs, gallops or clicks. No pedal edema. Gastrointestinal system: Abdomen is nondistended, soft and nontender. No organomegaly or masses felt. Normal bowel sounds heard. Central nervous system: Alert and oriented. No focal neurological deficits. Extremities: Symmetric 5 x 5 power. Skin: No rashes, lesions or ulcers.     Data Reviewed: I have personally reviewed following labs and imaging studies  CBC: Recent Labs  Lab 03/11/17 0620 03/13/17 0406 03/13/17 0837 03/13/17 1207 03/13/17 1426 03/13/17 1848  WBC 7.2 7.3  --   --   --  12.8*  HGB 14.6 14.0 13.3 11.2* 9.9* 10.6*  HCT 43.7 43.0 39.0 33.0* 29.0* 33.7*  MCV 92.2 94.7  --   --   --  96.3  PLT 182 187  --   --   --  366   Basic Metabolic Panel: Recent Labs  Lab 03/10/17 1718 03/13/17 0406 03/13/17 0837 03/13/17 1207 03/13/17 1426 03/13/17 1848 03/14/17 0421  NA 135 136 139 141 140 142 143  K 5.2* 4.8 3.9 4.2 4.5 4.8 5.2*  CL 100* 102  --   --   --  112* 114*  CO2 23 22  --   --   --  17* 21*  GLUCOSE 224* 286*  230*  --   --  223* 208*  BUN 22* 32*  --   --   --  30* 34*  CREATININE 0.79 0.90  --   --   --  1.01* 0.97  CALCIUM 9.4 9.3  --   --   --  8.1* 8.3*   GFR: Estimated Creatinine Clearance: 69.4 mL/min (by C-G formula based on SCr of 0.97 mg/dL). Liver Function Tests: Recent Labs  Lab 03/10/17 1718  AST 34  ALT 35  ALKPHOS 62  BILITOT 1.2  PROT 6.6  ALBUMIN 3.3*   No results for input(s): LIPASE, AMYLASE in the last 168 hours. No results for input(s): AMMONIA in the last 168 hours. Coagulation Profile: No  results for input(s): INR, PROTIME in the last 168 hours. Cardiac Enzymes: No results for input(s): CKTOTAL, CKMB, CKMBINDEX, TROPONINI in the last 168 hours. BNP (last 3 results) No results for input(s): PROBNP in the last 8760 hours. HbA1C: No results for input(s): HGBA1C in the last 72 hours. CBG: Recent Labs  Lab 03/14/17 1123 03/14/17 1327 03/14/17 1822 03/14/17 2116 03/15/17 0750  GLUCAP 258* 248* 246* 308* 209*   Lipid Profile: No results for input(s): CHOL, HDL, LDLCALC, TRIG, CHOLHDL, LDLDIRECT in the last 72 hours. Thyroid Function Tests: No results for input(s): TSH, T4TOTAL, FREET4, T3FREE, THYROIDAB in the last 72 hours. Anemia Panel: No results for input(s): VITAMINB12, FOLATE, FERRITIN, TIBC, IRON, RETICCTPCT in the last 72 hours. Sepsis Labs: No results for input(s): PROCALCITON, LATICACIDVEN in the last 168 hours.  Recent Results (from the past 240 hour(s))  Surgical PCR screen     Status: Abnormal   Collection Time: 03/11/17 11:03 AM  Result Value Ref Range Status   MRSA, PCR NEGATIVE NEGATIVE Final   Staphylococcus aureus POSITIVE (A) NEGATIVE Final    Comment: (NOTE) The Xpert SA Assay (FDA approved for NASAL specimens in patients 20 years of age and older), is one component of a comprehensive surveillance program. It is not intended to diagnose infection nor to guide or monitor treatment. Performed at [facility], [place] [address] [place], [place] [address]   MRSA PCR Screening     Status: None   Collection Time: 03/11/17  5:10 PM  Result Value Ref Range Status   MRSA by PCR NEGATIVE NEGATIVE Final    Comment:        The GeneXpert MRSA Assay (FDA approved for NASAL specimens only), is one component of a comprehensive MRSA colonization surveillance program. It is not intended to diagnose MRSA infection nor to guide or monitor treatment for MRSA infections. Performed at [facility], [place] [address] [place], [place]  [address]          Radiology Studies: Mr Jeri Cos YF Contrast  Result Date: 03/14/2017 CLINICAL DATA:  Metastatic brain cancer. Breast cancer. Craniotomy for cerebellar metastasis yesterday. EXAM: MRI HEAD WITHOUT AND WITH CONTRAST TECHNIQUE: Multiplanar, multiecho pulse sequences of the brain and surrounding structures were obtained without and with intravenous contrast. CONTRAST:  20 mL MultiHance COMPARISON:  MRI brain 03/07/2017 FINDINGS: Brain: Interval craniotomy is noted. There is a subtotal resection tumor in the cerebellum. Significant debulking is present. Residual enhancing margin extends up to 10 mm ventral to the resection cavity. Residual enhancement is also extends medially to involve the inferior cerebellar vermis. Previously noted mass effect is markedly improved. Fourth ventricle is restored. Lateral and third ventricular size have decreased. Thickening of the right tentorium likely reflects tumor infiltration. A  2 mm focus of enhancement in the right occipital lobe is again suggested. Today's study is mildly degraded by patient motion. Periventricular T2 changes in mild atrophy are otherwise stable. No acute infarct is present following surgery. Vascular: Flow is present in the major intracranial arteries. Skull and upper cervical spine: The skull base is otherwise normal. The craniocervical junction is intact. Cerebellar tonsils no longer extend into the foramen magnum. Sinuses/Orbits: The paranasal sinuses are clear. Fluid is present in the inferior mastoid air cells bilaterally. Globes and orbits are intact. IMPRESSION: 1. Interval craniotomy significant debulking of cerebellar tumor reduction in mass effect. 2. Stable punctate 2 mm enhancing lesion in the right occipital pole suggesting additional metastasis. 3. Decreased mass effect on the fourth ventricle and decreased size of the lateral and third ventricles. 4. Stable atrophy and white matter disease. Electronically Signed   By:  San Morelle M.D.   On: 03/14/2017 18:28        Scheduled Meds: . amLODipine  10 mg Oral Daily  . carvedilol  12.5 mg Oral BID WC  . chlorhexidine  15 mL Mouth Rinse BID  . Chlorhexidine Gluconate Cloth  6 each Topical q morning - 10a  . dexamethasone  4 mg Intravenous Q8H  . docusate sodium  100 mg Oral BID  . feeding supplement (ENSURE ENLIVE)  237 mL Oral BID BM  . insulin aspart  0-20 Units Subcutaneous TID WC  . insulin aspart  4 Units Subcutaneous TID WC  . insulin glargine  10 Units Subcutaneous QHS  . mouth rinse  15 mL Mouth Rinse q12n4p  . mirtazapine  15 mg Oral QHS  . mupirocin ointment  1 application Nasal BID  . nystatin-triamcinolone   Topical BID  . pantoprazole  40 mg Oral Daily   Continuous Infusions: . sodium chloride 75 mL/hr at 03/15/17 0700  . dexmedetomidine (PRECEDEX) IV infusion Stopped (03/14/17 0645)  . levETIRAcetam Stopped (03/15/17 0603)     LOS: 8 days      Georgette Shell, MD If 7PM-7AM, please contact night-coverage [email or web address] Password [facility] 03/15/2017, 12:11 PM

## 2017-03-16 ENCOUNTER — Encounter: Payer: Self-pay | Admitting: Radiation Oncology

## 2017-03-16 LAB — TYPE AND SCREEN
ABO/RH(D): A POS
Antibody Screen: NEGATIVE
UNIT DIVISION: 0
UNIT DIVISION: 0

## 2017-03-16 LAB — BPAM RBC
Blood Product Expiration Date: 201903092359
Blood Product Expiration Date: 201903092359
ISSUE DATE / TIME: 201902250756
ISSUE DATE / TIME: 201902250756
Unit Type and Rh: 6200
Unit Type and Rh: 6200

## 2017-03-16 LAB — GLUCOSE, CAPILLARY
GLUCOSE-CAPILLARY: 232 mg/dL — AB (ref 65–99)
GLUCOSE-CAPILLARY: 263 mg/dL — AB (ref 65–99)
GLUCOSE-CAPILLARY: 302 mg/dL — AB (ref 65–99)
Glucose-Capillary: 328 mg/dL — ABNORMAL HIGH (ref 65–99)

## 2017-03-16 MED ORDER — INSULIN GLARGINE 100 UNIT/ML ~~LOC~~ SOLN
15.0000 [IU] | Freq: Every day | SUBCUTANEOUS | Status: DC
  Administered 2017-03-16 – 2017-03-18 (×3): 15 [IU] via SUBCUTANEOUS
  Filled 2017-03-16 (×3): qty 0.15

## 2017-03-16 NOTE — NC FL2 (Signed)
[place] MEDICAID FL2 LEVEL OF CARE SCREENING TOOL     IDENTIFICATION  Patient Name: Denise Morton Birthdate: 1950-02-20 Sex: female Admission Date (Current Location): 03/07/2017  [facility] and [place] Number:  Herbalist and Address:  [facility], [place] [address], [place], [place] [address]      Provider Number: [id number]  Attending Physician Name and Address:  Kary Kos, MD  Relative Name and Phone Number:  Nathen May [phone]    Current Level of Care: Hospital Recommended Level of Care: [place] Prior Approval Number:    Date Approved/Denied: 03/16/17 PASRR Number: [id number]  Discharge Plan: SNF    Current Diagnoses: Patient Active Problem List   Diagnosis Date Noted  . Acute blood loss anemia   . Diabetes mellitus type 2 in obese ([place])   . Hyperkalemia   . Leukocytosis   . Post-operative pain   . Steroid-induced hyperglycemia   . Vasogenic edema ([place])   . Brain metastases ([place]) 03/13/2017  . Syncope 03/07/2017  . Chemotherapy-induced peripheral neuropathy ([place]) 04/22/2015  . Essential hypertension 04/03/2015  . Liver metastases ([place]) 03/11/2015  . Metastatic cancer ([place]) 01/29/2015  . Lung metastases ([place]) 10/20/2014  . Breast cancer of upper-outer quadrant of right female breast ([place]) 08/15/2014    Orientation RESPIRATION BLADDER Height & Weight     Self, Time, Situation, Place  O2(nasal canula) Continent Weight: 226 lb 13.7 oz (102.9 kg) Height:  5\' 7"  (170.2 cm)  BEHAVIORAL SYMPTOMS/MOOD NEUROLOGICAL BOWEL NUTRITION STATUS      Continent Diet(see discharge summary)  AMBULATORY STATUS COMMUNICATION OF NEEDS Skin   Limited Assist Verbally Surgical wounds(neck incision; head incision)                       Personal Care Assistance Level of Assistance  Bathing, Feeding, Dressing Bathing Assistance: Limited assistance Feeding assistance: Independent Dressing Assistance: Limited  assistance     Functional Limitations Info  Sight, Hearing, Speech Sight Info: Adequate Hearing Info: Adequate Speech Info: Adequate    SPECIAL CARE FACTORS FREQUENCY  PT (By licensed PT), OT (By licensed OT)     PT Frequency: 3x week OT Frequency: 3x week            Contractures Contractures Info: Not present    Additional Factors Info  Code Status, Allergies Code Status Info: Full Code             Current Medications (03/16/2017):  This is the current hospital active medication list Current Facility-Administered Medications  Medication Dose Route Frequency Provider Last Rate Last Dose  . 0.9 %  sodium chloride infusion   Intravenous Continuous Kary Kos, MD 10 mL/hr at 03/15/17 1745    . acetaminophen (TYLENOL) tablet 650 mg  650 mg Oral Q6H PRN Elwin Mocha, MD       Or  . acetaminophen (TYLENOL) suppository 650 mg  650 mg Rectal Q6H PRN Elwin Mocha, MD      . amLODipine (NORVASC) tablet 10 mg  10 mg Oral Daily Emokpae, Courage, MD   10 mg at 03/16/17 1026  . bacitracin ointment   Topical PRN Kary Kos, MD      . carvedilol (COREG) tablet 12.5 mg  12.5 mg Oral BID WC Emokpae, Courage, MD   12.5 mg at 03/16/17 0258  . chlorhexidine (PERIDEX) 0.12 % solution 15 mL  15 mL Mouth Rinse BID Elwin Mocha, MD   15 mL at 03/16/17  1026  . dexamethasone (DECADRON) injection 10 mg  10 mg Intravenous Q6H Kary Kos, MD   10 mg at 03/16/17 1300  . docusate sodium (COLACE) capsule 100 mg  100 mg Oral BID Kary Kos, MD   100 mg at 03/15/17 2129  . feeding supplement (ENSURE ENLIVE) (ENSURE ENLIVE) liquid 237 mL  237 mL Oral BID BM Elwin Mocha, MD   237 mL at 03/16/17 1025  . hydrALAZINE (APRESOLINE) injection 10 mg  10 mg Intravenous Q6H PRN Roxan Hockey, MD   10 mg at 03/12/17 0901  . HYDROmorphone (DILAUDID) injection 0.5 mg  0.5 mg Intravenous Q2H PRN Kary Kos, MD   0.5 mg at 03/15/17 0333  . insulin aspart (novoLOG) injection 0-20 Units  0-20 Units  Subcutaneous TID WC Kary Kos, MD   15 Units at 03/16/17 1300  . insulin aspart (novoLOG) injection 4 Units  4 Units Subcutaneous TID WC Kary Kos, MD   4 Units at 03/16/17 1300  . insulin glargine (LANTUS) injection 15 Units  15 Units Subcutaneous QHS Georgette Shell, MD      . labetalol (NORMODYNE,TRANDATE) injection 10 mg  10 mg Intravenous Q4H PRN Roxan Hockey, MD   10 mg at 03/16/17 0842  . labetalol (NORMODYNE,TRANDATE) injection 10-40 mg  10-40 mg Intravenous Q10 min PRN Kary Kos, MD   10 mg at 03/13/17 1715  . levETIRAcetam (KEPPRA) tablet 500 mg  500 mg Oral BID Kary Kos, MD   500 mg at 03/16/17 0506  . LORazepam (ATIVAN) injection 1 mg  1 mg Intravenous Daily PRN Elwin Mocha, MD   1 mg at 03/13/17 1821  . MEDLINE mouth rinse  15 mL Mouth Rinse q12n4p Elwin Mocha, MD   15 mL at 03/16/17 1324  . mirtazapine (REMERON SOL-TAB) disintegrating tablet 15 mg  15 mg Oral QHS Emokpae, Courage, MD   15 mg at 03/15/17 2130  . mupirocin ointment (BACTROBAN) 2 % 1 application  1 application Nasal BID Caren Griffins, MD   1 application at 21/30/86 1030  . nystatin-triamcinolone (MYCOLOG II) cream   Topical BID Emokpae, Courage, MD      . ondansetron (ZOFRAN) tablet 4 mg  4 mg Oral Q4H PRN Kary Kos, MD       Or  . ondansetron [facility]) injection 4 mg  4 mg Intravenous Q4H PRN Kary Kos, MD      . oxyCODONE (Oxy IR/ROXICODONE) immediate release tablet 5 mg  5 mg Oral Q4H PRN Roxan Hockey, MD   5 mg at 03/14/17 1337  . pantoprazole (PROTONIX) EC tablet 40 mg  40 mg Oral Daily Hayden Pedro, PA-C   40 mg at 03/16/17 1026  . prochlorperazine (COMPAZINE) tablet 10 mg  10 mg Oral Q6H PRN Elwin Mocha, MD   10 mg at 03/11/17 1434  . promethazine (PHENERGAN) tablet 12.5-25 mg  12.5-25 mg Oral Q4H PRN Kary Kos, MD      . sodium chloride flush (NS) 0.9 % injection 10-40 mL  10-40 mL Intracatheter Q12H Kary Kos, MD      . sodium chloride flush (NS) 0.9 % injection  10-40 mL  10-40 mL Intracatheter PRN Kary Kos, MD       Facility-Administered Medications Ordered in Other Encounters  Medication Dose Route Frequency Provider Last Rate Last Dose  . sodium chloride 0.9 % injection 10 mL  10 mL Intracatheter PRN Nicholas Lose, MD   10 mL at 02/18/15 1111  Discharge Medications: Please see discharge summary for a list of discharge medications.  Relevant Imaging Results:  Relevant Lab Results:   Additional Information SS# Wright Herr[place], [place]

## 2017-03-16 NOTE — Progress Notes (Signed)
Subjective: Patient reports Patient doing well no headache no nausea  Objective: Vital signs in last 24 hours: Temp:  [97.3 F (36.3 C)-98.9 F (37.2 C)] 98.7 F (37.1 C) (02/28 0400) Pulse Rate:  [56-79] 65 (02/28 0400) Resp:  [9-18] 13 (02/28 0400) BP: (93-150)/(60-99) 150/76 (02/28 0400) SpO2:  [94 %-100 %] 94 % (02/28 0400) Weight:  [102.9 kg (226 lb 13.7 oz)] 102.9 kg (226 lb 13.7 oz) (02/28 0500)  Intake/Output from previous day: 02/27 0701 - 02/28 0700 In: 1628.8 [P.O.:720; I.V.:908.8] Out: -  Intake/Output this shift: No intake/output data recorded.  Awake alert incision dry no drainage  Lab Results: Recent Labs    03/13/17 1426 03/13/17 1848  WBC  --  12.8*  HGB 9.9* 10.6*  HCT 29.0* 33.7*  PLT  --  175   BMET Recent Labs    03/13/17 1848 03/14/17 0421  NA 142 143  K 4.8 5.2*  CL 112* 114*  CO2 17* 21*  GLUCOSE 223* 208*  BUN 30* 34*  CREATININE 1.01* 0.97  CALCIUM 8.1* 8.3*    Studies/Results: Mr Jeri Cos Wo Contrast  Result Date: 03/14/2017 CLINICAL DATA:  Metastatic brain cancer. Breast cancer. Craniotomy for cerebellar metastasis yesterday. EXAM: MRI HEAD WITHOUT AND WITH CONTRAST TECHNIQUE: Multiplanar, multiecho pulse sequences of the brain and surrounding structures were obtained without and with intravenous contrast. CONTRAST:  20 mL MultiHance COMPARISON:  MRI brain 03/07/2017 FINDINGS: Brain: Interval craniotomy is noted. There is a subtotal resection tumor in the cerebellum. Significant debulking is present. Residual enhancing margin extends up to 10 mm ventral to the resection cavity. Residual enhancement is also extends medially to involve the inferior cerebellar vermis. Previously noted mass effect is markedly improved. Fourth ventricle is restored. Lateral and third ventricular size have decreased. Thickening of the right tentorium likely reflects tumor infiltration. A 2 mm focus of enhancement in the right occipital lobe is again suggested.  Today's study is mildly degraded by patient motion. Periventricular T2 changes in mild atrophy are otherwise stable. No acute infarct is present following surgery. Vascular: Flow is present in the major intracranial arteries. Skull and upper cervical spine: The skull base is otherwise normal. The craniocervical junction is intact. Cerebellar tonsils no longer extend into the foramen magnum. Sinuses/Orbits: The paranasal sinuses are clear. Fluid is present in the inferior mastoid air cells bilaterally. Globes and orbits are intact. IMPRESSION: 1. Interval craniotomy significant debulking of cerebellar tumor reduction in mass effect. 2. Stable punctate 2 mm enhancing lesion in the right occipital pole suggesting additional metastasis. 3. Decreased mass effect on the fourth ventricle and decreased size of the lateral and third ventricles. 4. Stable atrophy and white matter disease. Electronically Signed   By: San Morelle M.D.   On: 03/14/2017 18:28    Assessment/Plan: Postop day 3 subsequent craniectomy for resection of cerebellar mass patient doing very well no evidence of drainage from incision will mobilized today with physical therapy continue observing patient should be ready for transfer to rehabilitation tomorrow.  LOS: 9 days     Kaylany Tesoriero P 03/16/2017, 7:32 AM

## 2017-03-16 NOTE — Social Work (Signed)
CSW submitted pt clinicals to [facility], awaiting authorization. Pt will be given offers in the morning, aware pt is medically appropriate for discharge.   Alexander Mt, [place] Work [phone]

## 2017-03-16 NOTE — Progress Notes (Signed)
I spoke with the patient's son and we've scheduled an appt for outpt follow up on 03/23/17 and we will coordinate with rehab for transportation.

## 2017-03-16 NOTE — Progress Notes (Signed)
I met with patient at bedside to discuss her rehab venue options. She has no local assistance. Radiation follow up in two weeks after her surgery to discuss with Radiation Oncology. I discussed with PT. I await her therapy progress today and then will follow up with pt for our recommendations. [phone]

## 2017-03-16 NOTE — Progress Notes (Signed)
I met with pt at bedside to discuss her progress with therapy today. She is mobilizing better, but has limited assistance at home which she and I are concerned with. She therefore is in agreement to SNF rehab for longer recuperation. I have notified SW, Isabell. We will sign off at this time. [phone]

## 2017-03-16 NOTE — Care Management Note (Signed)
Case Management Note  Patient Details  Name: Denise Morton MRN: [id number] Date of Birth: Apr 07, 1950  Subjective/Objective:  Pt is a 67 y.o. female admitted with weakness and gastritis found to have cerebellar and occipital massess s/p resection 2/25 of right cerebellar mass, pt also with mets to liver, lung and bone.  PTA, pt independent lives alone.                 Action/Plan: PT/OT recommending CIR, but will need SNF due to limited assistance at home.  CSW to follow to facilitate dc to SNF upon medical stability.     Expected Discharge Date:  (unknown)               Expected Discharge Plan:  Skilled Nursing Facility  In-House Referral:  Clinical Social Work  Discharge planning Services  CM Consult  Post Acute Care Choice:    Choice offered to:     DME Arranged:    DME Agency:     HH Arranged:    [place] Agency:     Status of Service:  In process, will continue to follow  If discussed at Long Length of Stay Meetings, dates discussed:    Additional Comments:  Reinaldo Raddle, RN, BSN  Trauma/Neuro ICU Case Manager [phone]
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
## 2017-03-16 NOTE — Progress Notes (Signed)
PROGRESS NOTE    Denise Morton  WUX:[id number] DOB: [birth year] DOA: 03/07/2017 PCP: Seward Carol, MD  Brief Narrative: 67 y.o.femaleWith pmhx of BRCA, DM, admitted on 03/07/17 with weakness, dizziness, anorexia speech concerns and near syncope. 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 She was initially admitted to [facility], neurosurgery was consulted and patient was transferred to [facility] on 2/23 for plan for surgical removal by neurosurgery.Patient underwent suboccipital craniectomy for resection of right cerebellar meton 2/25 by Dr. Saintclair Halsted  03/16/2017-patient doing well denies any specific complaints today other than headache.  She was sitting up in the bedside commode when I saw her.  She had a bowel movement yesterday.  Assessment & Plan:   Active Problems:   Syncope   Brain metastases (HCC)   Acute blood loss anemia   Diabetes mellitus type 2 in obese (HCC)   Hyperkalemia   Leukocytosis   Post-operative pain   Steroid-induced hyperglycemia   Vasogenic edema (HCC) Brain metastasis in the setting of breast cancer -Neurosurgery consulted,she is now status post suboccipital craniotomy and resection of the right cerebellar mass.  Patient with serous drainage from the inferior aspect of her incision status post Dermabond.  Neurosurgery following.  Continue Keppra. -Radiation oncology consulted, oncology consulted -Continue Decadron per neurosurgery  Type 2 diabetes mellitus -Given Decadron, patient persistently hyperglycemic, will increase Lantus to 15 units. -Continue scheduled for units with meals plus sliding scale  Hypertension -Blood pressure this morning is elevated.  Patient is on labetalol to 10 minutes  As needed.  Continue Norvasc, Coreg.  Acute encephalopathy-resolving  DVT prophylaxis:scd Code Status: Full code Family Communication: No family available Disposition Plan: Awaiting decision on CIR    Consultants:  Neurosurgery, CIR  Procedures: Right cerebellar metastatic resection on 03/13/2017. Antimicrobials: None Subjective: She is up in a bedside commode awake alert and talking appropriately.   Objective: Vitals:   03/16/17 0000 03/16/17 0400 03/16/17 0500 03/16/17 0838  BP: 134/77 (!) 150/76  (!) 181/116  Pulse: 67 65  79  Resp: 14 13  (!) 23  Temp:  98.7 F (37.1 C)  99.4 F (37.4 C)  TempSrc:    Oral  SpO2: 96% 94%  96%  Weight:   102.9 kg (226 lb 13.7 oz)   Height:        Intake/Output Summary (Last 24 hours) at 03/16/2017 0855 Last data filed at 03/16/2017 0400 Gross per 24 hour  Intake 1553.75 ml  Output -  Net 1553.75 ml   Filed Weights   03/14/17 0500 03/15/17 0500 03/16/17 0500  Weight: 98.1 kg (216 lb 4.3 oz) 100.3 kg (221 lb 1.9 oz) 102.9 kg (226 lb 13.7 oz)    Examination:  General exam: Appears calm and comfortable .  Dressings on the posterior aspect of her scalp intact no evidence of drainage seeping through the dressing noted. Respiratory system: Clear to auscultation. Respiratory effort normal. Cardiovascular system: S1 & S2 heard, RRR. No JVD, murmurs, rubs, gallops or clicks. No pedal edema. Gastrointestinal system: Abdomen is nondistended, soft and nontender. No organomegaly or masses felt. Normal bowel sounds heard. Central nervous system: Alert and oriented. No focal neurological deficits. Extremities: Symmetric 5 x 5 power. Skin: No rashes, lesions or ulcers Psychiatry: Judgement and insight appear normal. Mood & affect appropriate.     Data Reviewed: I have personally reviewed following labs and imaging studies  CBC: Recent Labs  Lab 03/11/17 0620 03/13/17 0406 03/13/17 2536 03/13/17 1207 03/13/17 1426  03/13/17 1848  WBC 7.2 7.3  --   --   --  12.8*  HGB 14.6 14.0 13.3 11.2* 9.9* 10.6*  HCT 43.7 43.0 39.0 33.0* 29.0* 33.7*  MCV 92.2 94.7  --   --   --  96.3  PLT 182 187  --   --   --  809   Basic Metabolic  Panel: Recent Labs  Lab 03/10/17 1718 03/13/17 0406 03/13/17 0837 03/13/17 1207 03/13/17 1426 03/13/17 1848 03/14/17 0421  NA 135 136 139 141 140 142 143  K 5.2* 4.8 3.9 4.2 4.5 4.8 5.2*  CL 100* 102  --   --   --  112* 114*  CO2 23 22  --   --   --  17* 21*  GLUCOSE 224* 286* 230*  --   --  223* 208*  BUN 22* 32*  --   --   --  30* 34*  CREATININE 0.79 0.90  --   --   --  1.01* 0.97  CALCIUM 9.4 9.3  --   --   --  8.1* 8.3*   GFR: Estimated Creatinine Clearance: 70.3 mL/min (by C-G formula based on SCr of 0.97 mg/dL). Liver Function Tests: Recent Labs  Lab 03/10/17 1718  AST 34  ALT 35  ALKPHOS 62  BILITOT 1.2  PROT 6.6  ALBUMIN 3.3*   No results for input(s): LIPASE, AMYLASE in the last 168 hours. No results for input(s): AMMONIA in the last 168 hours. Coagulation Profile: No results for input(s): INR, PROTIME in the last 168 hours. Cardiac Enzymes: No results for input(s): CKTOTAL, CKMB, CKMBINDEX, TROPONINI in the last 168 hours. BNP (last 3 results) No results for input(s): PROBNP in the last 8760 hours. HbA1C: No results for input(s): HGBA1C in the last 72 hours. CBG: Recent Labs  Lab 03/15/17 0750 03/15/17 1152 03/15/17 1727 03/15/17 2134 03/16/17 0829  GLUCAP 209* 230* 175* 290* 232*   Lipid Profile: No results for input(s): CHOL, HDL, LDLCALC, TRIG, CHOLHDL, LDLDIRECT in the last 72 hours. Thyroid Function Tests: No results for input(s): TSH, T4TOTAL, FREET4, T3FREE, THYROIDAB in the last 72 hours. Anemia Panel: No results for input(s): VITAMINB12, FOLATE, FERRITIN, TIBC, IRON, RETICCTPCT in the last 72 hours. Sepsis Labs: No results for input(s): PROCALCITON, LATICACIDVEN in the last 168 hours.  Recent Results (from the past 240 hour(s))  Surgical PCR screen     Status: Abnormal   Collection Time: 03/11/17 11:03 AM  Result Value Ref Range Status   MRSA, PCR NEGATIVE NEGATIVE Final   Staphylococcus aureus POSITIVE (A) NEGATIVE Final     Comment: (NOTE) The Xpert SA Assay (FDA approved for NASAL specimens in patients 62 years of age and older), is one component of a comprehensive surveillance program. It is not intended to diagnose infection nor to guide or monitor treatment. Performed at [facility], [place] [address] [place], [place] [address]   MRSA PCR Screening     Status: None   Collection Time: 03/11/17  5:10 PM  Result Value Ref Range Status   MRSA by PCR NEGATIVE NEGATIVE Final    Comment:        The GeneXpert MRSA Assay (FDA approved for NASAL specimens only), is one component of a comprehensive MRSA colonization surveillance program. It is not intended to diagnose MRSA infection nor to guide or monitor treatment for MRSA infections. Performed at [facility], [place] [address], [place], [place] [address]  Radiology Studies: Mr Jeri Cos KZ Contrast  Result Date: 03/14/2017 CLINICAL DATA:  Metastatic brain cancer. Breast cancer. Craniotomy for cerebellar metastasis yesterday. EXAM: MRI HEAD WITHOUT AND WITH CONTRAST TECHNIQUE: Multiplanar, multiecho pulse sequences of the brain and surrounding structures were obtained without and with intravenous contrast. CONTRAST:  20 mL MultiHance COMPARISON:  MRI brain 03/07/2017 FINDINGS: Brain: Interval craniotomy is noted. There is a subtotal resection tumor in the cerebellum. Significant debulking is present. Residual enhancing margin extends up to 10 mm ventral to the resection cavity. Residual enhancement is also extends medially to involve the inferior cerebellar vermis. Previously noted mass effect is markedly improved. Fourth ventricle is restored. Lateral and third ventricular size have decreased. Thickening of the right tentorium likely reflects tumor infiltration. A 2 mm focus of enhancement in the right occipital lobe is again suggested. Today's study is mildly degraded by patient motion. Periventricular T2 changes in mild atrophy  are otherwise stable. No acute infarct is present following surgery. Vascular: Flow is present in the major intracranial arteries. Skull and upper cervical spine: The skull base is otherwise normal. The craniocervical junction is intact. Cerebellar tonsils no longer extend into the foramen magnum. Sinuses/Orbits: The paranasal sinuses are clear. Fluid is present in the inferior mastoid air cells bilaterally. Globes and orbits are intact. IMPRESSION: 1. Interval craniotomy significant debulking of cerebellar tumor reduction in mass effect. 2. Stable punctate 2 mm enhancing lesion in the right occipital pole suggesting additional metastasis. 3. Decreased mass effect on the fourth ventricle and decreased size of the lateral and third ventricles. 4. Stable atrophy and white matter disease. Electronically Signed   By: San Morelle M.D.   On: 03/14/2017 18:28        Scheduled Meds: . amLODipine  10 mg Oral Daily  . carvedilol  12.5 mg Oral BID WC  . chlorhexidine  15 mL Mouth Rinse BID  . dexamethasone  10 mg Intravenous Q6H  . docusate sodium  100 mg Oral BID  . feeding supplement (ENSURE ENLIVE)  237 mL Oral BID BM  . insulin aspart  0-20 Units Subcutaneous TID WC  . insulin aspart  4 Units Subcutaneous TID WC  . insulin glargine  10 Units Subcutaneous QHS  . levETIRAcetam  500 mg Oral BID  . mouth rinse  15 mL Mouth Rinse q12n4p  . mirtazapine  15 mg Oral QHS  . mupirocin ointment  1 application Nasal BID  . nystatin-triamcinolone   Topical BID  . pantoprazole  40 mg Oral Daily  . sodium chloride flush  10-40 mL Intracatheter Q12H   Continuous Infusions: . sodium chloride 10 mL/hr at 03/15/17 1745     LOS: 9 days     Georgette Shell, MD [facility]  If 7PM-7AM, please contact night-coverage [email or web address] Password [facility] 03/16/2017, 8:55 AM

## 2017-03-16 NOTE — Clinical Social Work Note (Signed)
Clinical Social Work Assessment  Patient Details  Name: Denise Morton MRN: [id number] Date of Birth: 1950/09/01  Date of referral:  03/16/17               Reason for consult:  Facility Placement                Permission sought to share information with:  Facility Sport and exercise psychologist, Family Supports Permission granted to share information::  Yes, Verbal Permission Granted  Name::     Darla Lesches::     Relationship::  son  Contact Information:     Housing/Transportation Living arrangements for the past 2 months:  Single Family Home Source of Information:  Patient Patient Interpreter Needed:  None Criminal Activity/Legal Involvement Pertinent to Current Situation/Hospitalization:  No - Comment as needed Significant Relationships:  Adult Children, Other Family Members Lives with:  Self Do you feel safe going back to the place where you live?  Yes Need for family participation in patient care:  Yes (Comment)  Care giving concerns:  Pt lives alone and requires some supervision for mobility which pt family is unable to provide due to work schedules.    Social Worker assessment / plan:  CSW met with pt at bedside, pt is friendly and was aware of CSW role and purpose of assessment as pt had spoken with CIR admissions and did not qualify. Pt states she lives by herself and doesn't have full supervision at home so she would feel safer getting PT in a SNF at the current time. CSW to fax out FL2 and present pt with offers.    Employment status:  Retired Surveyor, [employer] Care PT Recommendations:  [place] / Referral to community resources:  [place]  Patient/Family's Response to care:  Pt pleasant, amenable to SNF placement and expressed thanks for CSW visit several times.   Patient/Family's Understanding of and Emotional Response to Diagnosis, Current Treatment, and Prognosis:  Pt understands diagnosis, current treatment, and  prognosis and expresses appropriate concern for living alone and emotionally was very positive in outlook when speaking with CSW.   Emotional Assessment Appearance:  Appears stated age Attitude/Demeanor/Rapport:  Engaged, Gracious Affect (typically observed):  Accepting, Appropriate, Pleasant Orientation:  Oriented to Self, Oriented to Place, Oriented to  Time, Oriented to Situation Alcohol / Substance use:  Not Applicable Psych involvement (Current and /or in the community):  No (Comment)  Discharge Needs  Concerns to be addressed:  Care Coordination, Discharge Planning Concerns Readmission within the last 30 days:  No Current discharge risk:  Lives alone Barriers to Discharge:  Continued Medical Work up, [employer], [place] 03/16/2017, 3:49 PM

## 2017-03-17 LAB — CBC WITH DIFFERENTIAL/PLATELET
BASOS PCT: 0 %
Basophils Absolute: 0 10*3/uL (ref 0.0–0.1)
Eosinophils Absolute: 0 10*3/uL (ref 0.0–0.7)
Eosinophils Relative: 0 %
HEMATOCRIT: 27.3 % — AB (ref 36.0–46.0)
Hemoglobin: 8.5 g/dL — ABNORMAL LOW (ref 12.0–15.0)
Lymphocytes Relative: 8 %
Lymphs Abs: 0.8 10*3/uL (ref 0.7–4.0)
MCH: 29.7 pg (ref 26.0–34.0)
MCHC: 31.1 g/dL (ref 30.0–36.0)
MCV: 95.5 fL (ref 78.0–100.0)
MONO ABS: 0.5 10*3/uL (ref 0.1–1.0)
MONOS PCT: 5 %
NEUTROS ABS: 8.7 10*3/uL — AB (ref 1.7–7.7)
Neutrophils Relative %: 87 %
Platelets: 159 10*3/uL (ref 150–400)
RBC: 2.86 MIL/uL — ABNORMAL LOW (ref 3.87–5.11)
RDW: 16.2 % — AB (ref 11.5–15.5)
WBC: 10.1 10*3/uL (ref 4.0–10.5)

## 2017-03-17 LAB — GLUCOSE, CAPILLARY
Glucose-Capillary: 244 mg/dL — ABNORMAL HIGH (ref 65–99)
Glucose-Capillary: 276 mg/dL — ABNORMAL HIGH (ref 65–99)
Glucose-Capillary: 242 mg/dL — ABNORMAL HIGH (ref 65–99)
Glucose-Capillary: 188 mg/dL — ABNORMAL HIGH (ref 65–99)

## 2017-03-17 LAB — BASIC METABOLIC PANEL
Anion gap: 11 (ref 5–15)
BUN: 30 mg/dL — AB (ref 6–20)
CALCIUM: 9 mg/dL (ref 8.9–10.3)
CO2: 24 mmol/L (ref 22–32)
CREATININE: 0.9 mg/dL (ref 0.44–1.00)
Chloride: 107 mmol/L (ref 101–111)
GFR calc non Af Amer: >60 mL/min (ref >60)
GLUCOSE: 271 mg/dL — AB (ref 65–99)
Potassium: 4 mmol/L (ref 3.5–5.1)
Sodium: 142 mmol/L (ref 135–145)

## 2017-03-17 MED ORDER — AMLODIPINE BESYLATE 10 MG PO TABS: 10.0000 mg | ORAL_TABLET | Freq: Every day | ORAL | 0 refills | Status: AC

## 2017-03-17 MED ORDER — DEXAMETHASONE 4 MG PO TABS
4.0000 mg | ORAL_TABLET | Freq: Three times a day (TID) | ORAL | Status: DC
  Administered 2017-03-17 – 2017-03-22 (×15): 4 mg via ORAL
  Filled 2017-03-17 (×15): qty 1

## 2017-03-17 MED ORDER — INSULIN ASPART 100 UNIT/ML ~~LOC~~ SOLN: 4.0000 [IU] | Freq: Three times a day (TID) | SUBCUTANEOUS | 11 refills | Status: AC

## 2017-03-17 MED ORDER — BACITRACIN ZINC 500 UNIT/GM EX OINT: TOPICAL_OINTMENT | CUTANEOUS | 0 refills | Status: AC | PRN

## 2017-03-17 MED ORDER — NYSTATIN-TRIAMCINOLONE 100000-0.1 UNIT/GM-% EX CREA: TOPICAL_CREAM | Freq: Two times a day (BID) | CUTANEOUS | 0 refills | Status: AC

## 2017-03-17 MED ORDER — DEXAMETHASONE 4 MG PO TABS: 4.0000 mg | ORAL_TABLET | Freq: Three times a day (TID) | ORAL | 0 refills | Status: AC

## 2017-03-17 MED ORDER — CARVEDILOL 12.5 MG PO TABS: 25.0000 mg | ORAL_TABLET | Freq: Two times a day (BID) | ORAL | 0 refills | Status: DC

## 2017-03-17 MED ORDER — OXYCODONE HCL 5 MG PO TABS: 5.0000 mg | ORAL_TABLET | ORAL | 0 refills | Status: DC | PRN

## 2017-03-17 MED ORDER — CHLORHEXIDINE GLUCONATE CLOTH 2 % EX PADS
6.0000 | MEDICATED_PAD | Freq: Every day | CUTANEOUS | Status: DC
  Administered 2017-03-18 – 2017-03-22 (×5): 6 via TOPICAL

## 2017-03-17 MED ORDER — INSULIN ASPART 100 UNIT/ML ~~LOC~~ SOLN: 4.0000 [IU] | Freq: Three times a day (TID) | SUBCUTANEOUS | 11 refills | Status: DC

## 2017-03-17 MED ORDER — AMLODIPINE BESYLATE 10 MG PO TABS: 10.0000 mg | ORAL_TABLET | Freq: Every day | ORAL | 0 refills | Status: DC

## 2017-03-17 MED ORDER — LEVETIRACETAM 500 MG PO TABS: 500.0000 mg | ORAL_TABLET | Freq: Two times a day (BID) | ORAL | 0 refills | Status: DC

## 2017-03-17 MED ORDER — ONDANSETRON HCL 8 MG PO TABS: 8.0000 mg | ORAL_TABLET | Freq: Three times a day (TID) | ORAL | 3 refills | Status: AC | PRN

## 2017-03-17 MED ORDER — MIRTAZAPINE 15 MG PO TBDP: 15.0000 mg | ORAL_TABLET | Freq: Every day | ORAL | 0 refills | Status: DC

## 2017-03-17 MED ORDER — MIRTAZAPINE 15 MG PO TBDP: 15.0000 mg | ORAL_TABLET | Freq: Every day | ORAL | 0 refills | Status: AC

## 2017-03-17 MED ORDER — INSULIN GLARGINE 100 UNIT/ML ~~LOC~~ SOLN: 15.0000 [IU] | Freq: Every day | SUBCUTANEOUS | 11 refills | Status: DC

## 2017-03-17 MED ORDER — ENSURE ENLIVE PO LIQD: 237.0000 mL | Freq: Two times a day (BID) | ORAL | 12 refills | Status: DC

## 2017-03-17 MED ORDER — DEXAMETHASONE 0.5 MG PO TABS: 10.0000 mg | ORAL_TABLET | Freq: Four times a day (QID) | ORAL | 0 refills | Status: DC

## 2017-03-17 MED ORDER — NYSTATIN-TRIAMCINOLONE 100000-0.1 UNIT/GM-% EX CREA: TOPICAL_CREAM | Freq: Two times a day (BID) | CUTANEOUS | 0 refills | Status: DC

## 2017-03-17 MED ORDER — DOCUSATE SODIUM 100 MG PO CAPS: 100.0000 mg | ORAL_CAPSULE | Freq: Two times a day (BID) | ORAL | 0 refills | Status: DC

## 2017-03-17 MED ORDER — DOCUSATE SODIUM 100 MG PO CAPS: 100.0000 mg | ORAL_CAPSULE | Freq: Two times a day (BID) | ORAL | 0 refills | Status: AC

## 2017-03-17 MED ORDER — GABAPENTIN 300 MG PO CAPS: 300.0000 mg | ORAL_CAPSULE | Freq: Three times a day (TID) | ORAL | 6 refills | Status: AC

## 2017-03-17 NOTE — Progress Notes (Signed)
Physical Therapy Treatment Patient Details Name: Denise Morton MRN: [id number] DOB: [birth year] Today's Date: 03/17/2017    History of Present Illness  Denise Morton is a 67 y.o. female admitted with weakness and gastritis found to have cerebellar and occipital massess s/p resection 2/25 of right cerebellar mass, pt also with mets to liver, lung and bone. PMHx of Breast CA, DM, central retinal vein occlusion right eye    PT Comments    Pt with improved cognition, ambulation and activity tolerance today. Pt continues to request RW use for gait and not ready to attempt additional balance challenges. Pt with increase problem solving and memory today and continue to demonstrate cognitive progression. Pt educated for HEp and encouraged to continue mobility with staff.     Follow Up Recommendations  Supervision/Assistance - 24 hour;SNF(CIR declined)     Equipment Recommendations  Rolling walker with 5" wheels    Recommendations for Other Services       Precautions / Restrictions Precautions Precautions: Fall Restrictions Weight Bearing Restrictions: No    Mobility  Bed Mobility Overal bed mobility: Modified Independent                Transfers Overall transfer level: Modified independent                  Ambulation/Gait Ambulation/Gait assistance: Supervision Ambulation Distance (Feet): 450 Feet Assistive device: Rolling walker (2 wheeled) Gait Pattern/deviations: Step-through pattern;Decreased stride length   Gait velocity interpretation: Below normal speed for age/gender General Gait Details: supervision for safety, pt able to find her way to her room, and converse throughout gait with use of Rw. Pt required stopping to be able to perform head turns with gait   Stairs            Wheelchair Mobility    Modified Rankin (Stroke Patients Only)       Balance Overall balance assessment: Needs assistance   Sitting balance-Leahy Scale: Fair        Standing balance-Leahy Scale: Poor                              Cognition Arousal/Alertness: Awake/alert Behavior During Therapy: WFL for tasks assessed/performed Overall Cognitive Status: Impaired/Different from baseline Area of Impairment: Problem solving;Safety/judgement                       Following Commands: Follows one step commands consistently Safety/Judgement: Decreased awareness of deficits     General Comments: pt able to count to 51 by 3s, name 3 animals that start with C and B, recall room number, oriented x 4 and able to recall entire session yesterday.       Exercises General Exercises - Lower Extremity Long Arc Quad: AROM;Seated;Both;15 reps Hip Flexion/Marching: AROM;Both;Seated;15 reps    General Comments        Pertinent Vitals/Pain Pain Assessment: No/denies pain    Home Living                      Prior Function            PT Goals (current goals can now be found in the care plan section) Progress towards PT goals: Progressing toward goals    Frequency           PT Plan Current plan remains appropriate    Co-evaluation  AM-PAC PT "6 Clicks" Daily Activity  Outcome Measure  Difficulty turning over in bed (including adjusting bedclothes, sheets and blankets)?: None Difficulty moving from lying on back to sitting on the side of the bed? : None Difficulty sitting down on and standing up from a chair with arms (e.g., wheelchair, bedside commode, etc,.)?: A Little Help needed moving to and from a bed to chair (including a wheelchair)?: A Little Help needed walking in hospital room?: A Little Help needed climbing 3-5 steps with a railing? : A Little 6 Click Score: 20    End of Session Equipment Utilized During Treatment: Gait belt Activity Tolerance: Patient tolerated treatment well Patient left: in chair;with call bell/phone within reach;with chair alarm set Nurse Communication:  Mobility status;Precautions PT Visit Diagnosis: Difficulty in walking, not elsewhere classified (R26.2);Other symptoms and signs involving the nervous system (R29.898)     Time: 6203-5597 PT Time Calculation (min) (ACUTE ONLY): 23 min  Charges:  $Gait Training: 8-22 mins $Therapeutic Activity: 8-22 mins                    G Codes:       Elwyn Reach, PT [phone]    [place] 03/17/2017, 11:39 AM

## 2017-03-17 NOTE — Discharge Summary (Addendum)
Physician Discharge Summary  Denise Morton SUP:[id number] DOB: 1950/08/13 DOA: 03/07/2017  PCP: Seward Carol, MD  Admit date: 03/07/2017 Discharge date:03/20/2017 Admitted From: Home Disposition: Skilled nursing facility Recommendations for Outpatient Follow-up:  1. Follow up with PCP in 1-2 weeks 2. Please obtain BMP/CBC in one week  Home Health none Equipment/Devices: None Discharge Condition stable CODE STATUS full code Diet recommendation: Cardiac diet Brief/Interim Summary:67 y.o.femaleWith pmhx of BRCA, DM, admitted on 03/07/17 with weakness, dizziness, anorexia speech concerns and near syncope. 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 She was initially admitted to [facility], neurosurgery was consulted and patient was transferred to [facility] on 2/23 for plan for surgical removal by neurosurgery.Patient underwent suboccipital craniectomy for resection of right cerebellar meton 2/25 by Dr. Saintclair Halsted  03/16/2017-patient doing well denies any specific complaints today other than headache.  She was sitting up in the bedside commode when I saw her.  She had a bowel movement yesterday.    Discharge Diagnoses:  Active Problems:   Syncope   Brain metastases (HCC)   Acute blood loss anemia   Diabetes mellitus type 2 in obese (HCC)   Hyperkalemia   Leukocytosis   Post-operative pain   Steroid-induced hyperglycemia   Vasogenic edema (HCC) Brain metastasis in the setting of breast cancer -Neurosurgery consulted,she is now status post suboccipital craniotomy and resection of the right cerebellar mass.  Patient with serous drainage from the inferior aspect of her incision status post Dermabond.  Continue Keppra.  Follow-up with neurosurgery. -Radiation oncology consulted.  Patient has follow-up appointment with radiation oncology. -Continue Decadron per neurosurgery-started on doxy by NS.  Type 2 diabetes mellitus -Given Decadron,  patient persistently hyperglycemic, will increase Lantus to 15 units. -Continue scheduled for units with meals plus sliding scale  Hypertension -Blood pressure this morning is elevated.  Patient is on labetalol to 10 minutes  As needed.  Continue Norvasc, Coreg.  Increase Coreg to 25 mg twice a day.  Acute encephalopathy-resolved.      Discharge Instructions   Allergies as of 03/17/2017      Reactions   Penicillin G Rash   Has patient had a PCN reaction causing immediate rash, facial/tongue/throat swelling, SOB or lightheadedness with hypotension: Unknown Has patient had a PCN reaction causing severe rash involving mucus membranes or skin necrosis: Unknown Has patient had a PCN reaction that required hospitalization: Unknown Has patient had a PCN reaction occurring within the last 10 years: Unknown If all of the above answers are "NO", then may proceed with Cephalosporin use.   Sulfa Antibiotics Rash      Medication List    TAKE these medications   amLODipine 10 MG tablet Commonly known as:  NORVASC Take 1 tablet (10 mg total) by mouth daily.   bacitracin ointment Apply topically as needed for wound care.   carvedilol 12.5 MG tablet Commonly known as:  COREG Take 2 tablets (25 mg total) by mouth 2 (two) times daily with a meal. What changed:  how much to take   dexamethasone 0.5 MG tablet Commonly known as:  DECADRON Take 20 tablets (10 mg total) by mouth every 6 (six) hours.   docusate sodium 100 MG capsule Commonly known as:  COLACE Take 1 capsule (100 mg total) by mouth 2 (two) times daily.   feeding supplement (ENSURE ENLIVE) Liqd Take 237 mLs by mouth 2 (two) times daily between meals.   gabapentin 300 MG capsule Commonly known as:  NEURONTIN TAKE ONE  CAPSULE BY MOUTH THREE TIMES DAILY   insulin aspart 100 UNIT/ML injection Commonly known as:  novoLOG Inject 4 Units into the skin 3 (three) times daily with meals.   insulin glargine 100 UNIT/ML  injection Commonly known as:  LANTUS Inject 0.15 mLs (15 Units total) into the skin at bedtime.   levETIRAcetam 500 MG tablet Commonly known as:  KEPPRA Take 1 tablet (500 mg total) by mouth 2 (two) times daily.   lidocaine-prilocaine cream Commonly known as:  EMLA APPLY TO THE AFFECTED AREA ONCE AS DIRECTED   loperamide 2 MG capsule Commonly known as:  IMODIUM Take 2 mg by mouth as needed for diarrhea or loose stools.   mirtazapine 15 MG disintegrating tablet Commonly known as:  REMERON SOL-TAB Take 1 tablet (15 mg total) by mouth at bedtime.   multivitamin with minerals Tabs tablet Take 1 tablet by mouth daily.   nystatin-triamcinolone cream Commonly known as:  MYCOLOG II Apply topically 2 (two) times daily.   ondansetron 8 MG tablet Commonly known as:  ZOFRAN Take 1 tablet (8 mg total) by mouth every 8 (eight) hours as needed for nausea.   oxyCODONE 5 MG immediate release tablet Commonly known as:  Oxy IR/ROXICODONE Take 1 tablet (5 mg total) by mouth every 4 (four) hours as needed for moderate pain.   prochlorperazine 10 MG tablet Commonly known as:  COMPAZINE TAKE 1 TABLET(10 MG) BY MOUTH EVERY 6 HOURS AS NEEDED FOR NAUSEA OR VOMITING      Follow-up Information    Seward Carol, MD Follow up.   Specialty:  Internal Medicine Contact information: [address] [place] [address] [phone]        Kary Kos, MD Follow up.   Specialty:  Neurosurgery Contact information: [address] [place] [address] [phone]          Allergies  Allergen Reactions  . Penicillin G Rash    Has patient had a PCN reaction causing immediate rash, facial/tongue/throat swelling, SOB or lightheadedness with hypotension: Unknown Has patient had a PCN reaction causing severe rash involving mucus membranes or skin necrosis: Unknown Has patient had a PCN reaction that required hospitalization: Unknown Has patient had a PCN  reaction occurring within the last 10 years: Unknown If all of the above answers are "NO", then may proceed with Cephalosporin use.   . Sulfa Antibiotics Rash    Consultations:  Neuro surgery   Procedures/Studies: Ct Head Wo Contrast  Addendum Date: 03/07/2017   ADDENDUM REPORT: 03/07/2017 15:42 ADDENDUM: These results were called by telephone at the time of interpretation on 03/07/2017 at 3:42 pm to Dr. Deno Etienne , who verbally acknowledged these results. Electronically Signed   By: Abelardo Diesel M.D.   On: 03/07/2017 15:42   Result Date: 03/07/2017 CLINICAL DATA:  Slurred speech today at doctor's office. History of breast cancer. EXAM: CT HEAD WITHOUT CONTRAST TECHNIQUE: Contiguous axial images were obtained from the base of the skull through the vertex without intravenous contrast. COMPARISON:  None. FINDINGS: Brain: There are multiple calcifications of the right cerebellum with low-density and changes of edema involving the bilateral cerebellum. There is bilateral periventricular white matter small vessel ischemic change. There is no midline shift or hydrocephalus. No acute hemorrhage is identified. Vascular: No hyperdense vessel or unexpected calcification. Skull: Normal. Negative for fracture or focal lesion. Sinuses/Orbits: No acute finding. Other: None. IMPRESSION: Multiple calcifications of right cerebellum with low-density in changes of edema involving the bilateral  cerebellum. Metastatic disease is suspected. Further evaluation with MRI of the brain is recommended. Electronically Signed: By: Abelardo Diesel M.D. On: 03/07/2017 15:32   Ct Chest W Contrast  Result Date: 03/09/2017 CLINICAL DATA:  Restaging workup of metastatic breast cancer with intracranial metastatic disease. EXAM: CT CHEST, ABDOMEN, AND PELVIS WITH CONTRAST TECHNIQUE: Multidetector CT imaging of the chest, abdomen and pelvis was performed following the standard protocol during bolus administration of intravenous  contrast. CONTRAST:  32m ISOVUE-300 IOPAMIDOL (ISOVUE-300) INJECTION 61%, 1060mISOVUE-300 IOPAMIDOL (ISOVUE-300) INJECTION 61% COMPARISON:  Multiple exams, including 10/18/2016 FINDINGS: CT CHEST FINDINGS Cardiovascular: Left Port-A-Cath tip: Lower SVC. Coronary, aortic arch, and branch vessel atherosclerotic vascular disease. Mediastinum/Nodes: Indistinctly marginated right axial nodularity with short axis diameter of 0.8 cm, stable. This is likely a small axillary lymph node. Lungs/Pleura: Trace bilateral pleural effusions. Centrilobular emphysema. Mild scarring in the right middle lobe and lingula. Musculoskeletal: Chronic mild anterior wedging of the T11 vertebral body. Cutaneous thickening and subcutaneous stranding in the right breast similar to the prior exam. CT ABDOMEN PELVIS FINDINGS Hepatobiliary: Mild fatty infiltration of the liver with some sparing along the gallbladder fossa. No focal hepatic mass identified. Gallbladder unremarkable. Pancreas: Unremarkable Spleen: Unremarkable Adrenals/Urinary Tract: Stable fullness of both adrenal glands without a well-defined mass. Stable exophytic homogeneous hypodense 2.4 cm fluid density lesion from the right mid kidney favoring cyst. Nonspecific hypodense 8 mm lesion of the left kidney lower pole is technically nonspecific due to small size. Similarly there is a nonspecific 6 mm lesion of the right kidney lower pole. Bilateral nonobstructive nephrolithiasis. The largest of the 6 right renal calculi is 5 mm in long axis on image 115/5. The largest of the 7 left renal calculi is 6 mm in long axis on image 84/5. No hydronephrosis, hydroureter, or ureteral/bladder calculus. Stomach/Bowel: Sigmoid colon diverticulosis without active diverticulitis. Low position of the anorectal junction compatible with pelvic floor laxity. Vascular/Lymphatic: Aortoiliac atherosclerotic vascular disease. No pathologic adenopathy identified. Reproductive: Enhancing lesion along the  posterior margin of the uterus measuring 2.1 by 1.3 cm today, formerly the same by my measurement. Looking back further this measured 1.6 by 1.3 cm on 03/10/2015. This lesion was mildly hypermetabolic on 1131/49/7026Adnexa unremarkable. Other: No supplemental non-categorized findings. Musculoskeletal: Grade 1 degenerative anterolisthesis at L4-5 with associated degenerative disc disease. No compelling findings of osseous metastatic disease. Umbilical hernia contains adipose tissue. IMPRESSION: 1. No convincing findings for metastatic disease in the chest, abdomen, or pelvis. There is some continued cutaneous thickening and subcutaneous stranding in the right breast much of which may be due to prior therapy. 2. A small posterior uterine mass has mildly enlarged over the last 3 years, and was previously mildly hypermetabolic (although metabolic activity does not differentiate benign from malignant myometrial masses). Given the very slow degree of enlargement is most likely that this is indeed a benign fibroid with some postmenopausal enlargement. Attention directed at this lesion on follow up studies is suggested to ensure lack of accelerated growth. 3. Other imaging findings of potential clinical significance: Aortic Atherosclerosis (ICD10-I70.0) and Emphysema (ICD10-J43.9). Coronary atherosclerosis. Trace bilateral pleural effusions. Hepatic steatosis. Stable fullness of both adrenal glands. Bilateral nonobstructive nephrolithiasis. Sigmoid diverticulosis. Pelvic floor laxity. Lumbar degenerative disc disease. Umbilical hernia containing adipose tissue. Electronically Signed   By: WaVan Clines.D.   On: 03/09/2017 07:08   Mr BrJeri CosoVZontrast  Result Date: 03/14/2017 CLINICAL DATA:  Metastatic brain cancer. Breast cancer. Craniotomy for cerebellar metastasis yesterday. EXAM: MRI HEAD  WITHOUT AND WITH CONTRAST TECHNIQUE: Multiplanar, multiecho pulse sequences of the brain and surrounding structures were  obtained without and with intravenous contrast. CONTRAST:  20 mL MultiHance COMPARISON:  MRI brain 03/07/2017 FINDINGS: Brain: Interval craniotomy is noted. There is a subtotal resection tumor in the cerebellum. Significant debulking is present. Residual enhancing margin extends up to 10 mm ventral to the resection cavity. Residual enhancement is also extends medially to involve the inferior cerebellar vermis. Previously noted mass effect is markedly improved. Fourth ventricle is restored. Lateral and third ventricular size have decreased. Thickening of the right tentorium likely reflects tumor infiltration. A 2 mm focus of enhancement in the right occipital lobe is again suggested. Today's study is mildly degraded by patient motion. Periventricular T2 changes in mild atrophy are otherwise stable. No acute infarct is present following surgery. Vascular: Flow is present in the major intracranial arteries. Skull and upper cervical spine: The skull base is otherwise normal. The craniocervical junction is intact. Cerebellar tonsils no longer extend into the foramen magnum. Sinuses/Orbits: The paranasal sinuses are clear. Fluid is present in the inferior mastoid air cells bilaterally. Globes and orbits are intact. IMPRESSION: 1. Interval craniotomy significant debulking of cerebellar tumor reduction in mass effect. 2. Stable punctate 2 mm enhancing lesion in the right occipital pole suggesting additional metastasis. 3. Decreased mass effect on the fourth ventricle and decreased size of the lateral and third ventricles. 4. Stable atrophy and white matter disease. Electronically Signed   By: San Morelle M.D.   On: 03/14/2017 18:28   Mr Jeri Cos And Wo Contrast  Result Date: 03/07/2017 CLINICAL DATA:  67 y/o F; right cerebellar lesion suspected metastatic disease, initial workup. History of metastatic breast cancer. EXAM: MRI HEAD WITHOUT AND WITH CONTRAST TECHNIQUE: Multiplanar, multiecho pulse sequences of the  brain and surrounding structures were obtained without and with intravenous contrast. CONTRAST:  44m MULTIHANCE GADOBENATE DIMEGLUMINE 529 MG/ML IV SOLN COMPARISON:  03/07/2017 CT head and 12/09/2014 PET-CT FINDINGS: Brain: Heterogeneous irregular marginated mass within the right superior cerebellum measuring 4.8 x 6.4 x 3.2 cm (AP x ML x CC series 11, image 17 and series 13, image 7). Mass demonstrates speckled T2 hypointense signal with susceptibility blooming corresponding to mineralization on CT. There are numerous tortuous blood vessel surrounding the mass. Surrounding edema extends into the right pons and left cerebellar hemisphere. Mass effect partially effaces the fourth ventricle and right aspect of quadrigeminal plate cistern. Mild brain parenchymal volume loss. Ventriculomegaly is borderline for the degree of volume loss. There is a background of moderate chronic microvascular ischemic changes of the brain and multiple foci of susceptibility hypointensity compatible with microhemorrhage. 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 2 mm focus of enhancement in right occipital lobe (series 11, image 30). Vascular: Patent central flow voids. Skull and upper cervical spine: Normal marrow signal. Sinuses/Orbits: Negative. Other: None. IMPRESSION: 1. Heterogeneous mass measuring up to 6.4 cm in the right superior cerebellum with surrounding abnormal vascularity. No appreciable lesion was present on the 2016 PET-CT noncontrast portion. 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 Associated edema and mass effect partially effaces fourth ventricle. 2. Lateral and third ventriculomegaly is borderline for degree of volume loss and early hydrocephalus is possible. 3. Additional 3 mm enhancing focus in right occipital lobe may represent metastasis. Electronically Signed   By: LKristine GarbeM.D.   On: 03/07/2017 18:57   UKoreaAbdomen  Complete  Result Date: 02/15/2017 CLINICAL DATA:  Nausea.  EXAM: ABDOMEN ULTRASOUND COMPLETE COMPARISON:  CT scan of October 18, 2016. FINDINGS: Gallbladder: No gallstones or wall thickening visualized. No sonographic Murphy sign noted by sonographer. Common bile duct: Diameter: 3.8 mm which is within normal limits. Liver: No focal lesion identified. Increased echogenicity of hepatic parenchyma is noted suggesting fatty infiltration. Portal vein is patent on color Doppler imaging with normal direction of blood flow towards the liver. IVC: No abnormality visualized. Pancreas: Visualized portion unremarkable. Spleen: Size and appearance within normal limits. Right Kidney: Length: 10.9 cm. 2.6 cm cyst is noted in midpole. 8 mm nonobstructive calculus is noted. Echogenicity within normal limits. No mass or hydronephrosis visualized. Left Kidney: Length: 10.8 cm. 6 mm calculus is noted. Echogenicity within normal limits. No mass or hydronephrosis visualized. Abdominal aorta: No aneurysm visualized. Other findings: None. IMPRESSION: Bilateral nonobstructive nephrolithiasis. Increased echogenicity of hepatic parenchyma is noted suggesting fatty infiltration. Electronically Signed   By: Marijo Conception, M.D.   On: 02/15/2017 15:22   Ct Abdomen Pelvis W Contrast  Result Date: 03/09/2017 CLINICAL DATA:  Restaging workup of metastatic breast cancer with intracranial metastatic disease. EXAM: CT CHEST, ABDOMEN, AND PELVIS WITH CONTRAST TECHNIQUE: Multidetector CT imaging of the chest, abdomen and pelvis was performed following the standard protocol during bolus administration of intravenous contrast. CONTRAST:  13m ISOVUE-300 IOPAMIDOL (ISOVUE-300) INJECTION 61%, 1040mISOVUE-300 IOPAMIDOL (ISOVUE-300) INJECTION 61% COMPARISON:  Multiple exams, including 10/18/2016 FINDINGS: CT CHEST FINDINGS Cardiovascular: Left Port-A-Cath tip: Lower SVC. Coronary, aortic arch, and branch vessel atherosclerotic vascular disease.  Mediastinum/Nodes: Indistinctly marginated right axial nodularity with short axis diameter of 0.8 cm, stable. This is likely a small axillary lymph node. Lungs/Pleura: Trace bilateral pleural effusions. Centrilobular emphysema. Mild scarring in the right middle lobe and lingula. Musculoskeletal: Chronic mild anterior wedging of the T11 vertebral body. Cutaneous thickening and subcutaneous stranding in the right breast similar to the prior exam. CT ABDOMEN PELVIS FINDINGS Hepatobiliary: Mild fatty infiltration of the liver with some sparing along the gallbladder fossa. No focal hepatic mass identified. Gallbladder unremarkable. Pancreas: Unremarkable Spleen: Unremarkable Adrenals/Urinary Tract: Stable fullness of both adrenal glands without a well-defined mass. Stable exophytic homogeneous hypodense 2.4 cm fluid density lesion from the right mid kidney favoring cyst. Nonspecific hypodense 8 mm lesion of the left kidney lower pole is technically nonspecific due to small size. Similarly there is a nonspecific 6 mm lesion of the right kidney lower pole. Bilateral nonobstructive nephrolithiasis. The largest of the 6 right renal calculi is 5 mm in long axis on image 115/5. The largest of the 7 left renal calculi is 6 mm in long axis on image 84/5. No hydronephrosis, hydroureter, or ureteral/bladder calculus. Stomach/Bowel: Sigmoid colon diverticulosis without active diverticulitis. Low position of the anorectal junction compatible with pelvic floor laxity. Vascular/Lymphatic: Aortoiliac atherosclerotic vascular disease. No pathologic adenopathy identified. Reproductive: Enhancing lesion along the posterior margin of the uterus measuring 2.1 by 1.3 cm today, formerly the same by my measurement. Looking back further this measured 1.6 by 1.3 cm on 03/10/2015. This lesion was mildly hypermetabolic on 1145/80/9983Adnexa unremarkable. Other: No supplemental non-categorized findings. Musculoskeletal: Grade 1 degenerative  anterolisthesis at L4-5 with associated degenerative disc disease. No compelling findings of osseous metastatic disease. Umbilical hernia contains adipose tissue. IMPRESSION: 1. No convincing findings for metastatic disease in the chest, abdomen, or pelvis. There is some continued cutaneous thickening and subcutaneous stranding in the right breast much of which may be due to prior therapy. 2. A small posterior uterine mass has mildly enlarged  over the last 3 years, and was previously mildly hypermetabolic (although metabolic activity does not differentiate benign from malignant myometrial masses). Given the very slow degree of enlargement is most likely that this is indeed a benign fibroid with some postmenopausal enlargement. Attention directed at this lesion on follow up studies is suggested to ensure lack of accelerated growth. 3. Other imaging findings of potential clinical significance: Aortic Atherosclerosis (ICD10-I70.0) and Emphysema (ICD10-J43.9). Coronary atherosclerosis. Trace bilateral pleural effusions. Hepatic steatosis. Stable fullness of both adrenal glands. Bilateral nonobstructive nephrolithiasis. Sigmoid diverticulosis. Pelvic floor laxity. Lumbar degenerative disc disease. Umbilical hernia containing adipose tissue. Electronically Signed   By: Van Clines M.D.   On: 03/09/2017 07:08    (Echo, Carotid, EGD, Colonoscopy, ERCP)    Subjective:   Discharge Exam: Vitals:   03/17/17 0800 03/17/17 0847  BP: (!) 185/101 (!) 178/111  Pulse: 71 77  Resp: (!) 41   Temp: 98.7 F (37.1 C)   SpO2: 93%    Vitals:   03/17/17 0400 03/17/17 0600 03/17/17 0800 03/17/17 0847  BP: (!) 141/81  (!) 185/101 (!) 178/111  Pulse: 70  71 77  Resp: 14  (!) 41   Temp: 98.2 F (36.8 C)  98.7 F (37.1 C)   TempSrc: Oral  Oral   SpO2: 92%  93%   Weight:  104.2 kg (229 lb 11.5 oz)    Height:        General: Pt is alert, awake, not in acute distress Cardiovascular: RRR, S1/S2 +, no rubs, no  gallops Respiratory: CTA bilaterally, no wheezing, no rhonchi Abdominal: Soft, NT, ND, bowel sounds + Extremities: no edema, no cyanosis    The results of significant diagnostics from this hospitalization (including imaging, microbiology, ancillary and laboratory) are listed below for reference.     Microbiology: Recent Results (from the past 240 hour(s))  Surgical PCR screen     Status: Abnormal   Collection Time: 03/11/17 11:03 AM  Result Value Ref Range Status   MRSA, PCR NEGATIVE NEGATIVE Final   Staphylococcus aureus POSITIVE (A) NEGATIVE Final    Comment: (NOTE) The Xpert SA Assay (FDA approved for NASAL specimens in patients 86 years of age and older), is one component of a comprehensive surveillance program. It is not intended to diagnose infection nor to guide or monitor treatment. Performed at [facility], [place] [address], [place], [place] [address]   MRSA PCR Screening     Status: None   Collection Time: 03/11/17  5:10 PM  Result Value Ref Range Status   MRSA by PCR NEGATIVE NEGATIVE Final    Comment:        The GeneXpert MRSA Assay (FDA approved for NASAL specimens only), is one component of a comprehensive MRSA colonization surveillance program. It is not intended to diagnose MRSA infection nor to guide or monitor treatment for MRSA infections. Performed at [facility], [place] [address] [place], [place] [address]      Labs: BNP (last 3 results) No results for input(s): BNP in the last 8760 hours. Basic Metabolic Panel: Recent Labs  Lab 03/10/17 1718 03/13/17 0406 03/13/17 0837 03/13/17 1207 03/13/17 1426 03/13/17 1848 03/14/17 0421 03/17/17 0554  NA 135 136 139 141 140 142 143 142  K 5.2* 4.8 3.9 4.2 4.5 4.8 5.2* 4.0  CL 100* 102  --   --   --  112* 114* 107  CO2 23 22  --   --   --  17* 21* 24  GLUCOSE 224* 286* 230*  --   --  223* 208* 271*  BUN 22* 32*  --   --   --  30* 34* 30*  CREATININE 0.79 0.90  --   --   --   1.01* 0.97 0.90  CALCIUM 9.4 9.3  --   --   --  8.1* 8.3* 9.0   Liver Function Tests: Recent Labs  Lab 03/10/17 1718  AST 34  ALT 35  ALKPHOS 62  BILITOT 1.2  PROT 6.6  ALBUMIN 3.3*   No results for input(s): LIPASE, AMYLASE in the last 168 hours. No results for input(s): AMMONIA in the last 168 hours. CBC: Recent Labs  Lab 03/11/17 0620 03/13/17 0406 03/13/17 0837 03/13/17 1207 03/13/17 1426 03/13/17 1848 03/17/17 0554  WBC 7.2 7.3  --   --   --  12.8* 10.1  NEUTROABS  --   --   --   --   --   --  8.7*  HGB 14.6 14.0 13.3 11.2* 9.9* 10.6* 8.5*  HCT 43.7 43.0 39.0 33.0* 29.0* 33.7* 27.3*  MCV 92.2 94.7  --   --   --  96.3 95.5  PLT 182 187  --   --   --  175 159   Cardiac Enzymes: No results for input(s): CKTOTAL, CKMB, CKMBINDEX, TROPONINI in the last 168 hours. BNP: Invalid input(s): POCBNP CBG: Recent Labs  Lab 03/16/17 0829 03/16/17 1147 03/16/17 1652 03/16/17 2144 03/17/17 0838  GLUCAP 232* 328* 263* 302* 244*   D-Dimer No results for input(s): DDIMER in the last 72 hours. Hgb A1c No results for input(s): HGBA1C in the last 72 hours. Lipid Profile No results for input(s): CHOL, HDL, LDLCALC, TRIG, CHOLHDL, LDLDIRECT in the last 72 hours. Thyroid function studies No results for input(s): TSH, T4TOTAL, T3FREE, THYROIDAB in the last 72 hours.  Invalid input(s): FREET3 Anemia work up No results for input(s): VITAMINB12, FOLATE, FERRITIN, TIBC, IRON, RETICCTPCT in the last 72 hours. Urinalysis No results found for: COLORURINE, APPEARANCEUR, [place], [place], [place], [place], [place], [place], PROTEINUR, UROBILINOGEN, NITRITE, LEUKOCYTESUR Sepsis Labs Invalid input(s): PROCALCITONIN,  WBC,  LACTICIDVEN Microbiology Recent Results (from the past 240 hour(s))  Surgical PCR screen     Status: Abnormal   Collection Time: 03/11/17 11:03 AM  Result Value Ref Range Status   MRSA, PCR NEGATIVE NEGATIVE Final   Staphylococcus aureus POSITIVE (A)  NEGATIVE Final    Comment: (NOTE) The Xpert SA Assay (FDA approved for NASAL specimens in patients 6 years of age and older), is one component of a comprehensive surveillance program. It is not intended to diagnose infection nor to guide or monitor treatment. Performed at [facility], [place] [address], [place], [place] [address]   MRSA PCR Screening     Status: None   Collection Time: 03/11/17  5:10 PM  Result Value Ref Range Status   MRSA by PCR NEGATIVE NEGATIVE Final    Comment:        The GeneXpert MRSA Assay (FDA approved for NASAL specimens only), is one component of a comprehensive MRSA colonization surveillance program. It is not intended to diagnose MRSA infection nor to guide or monitor treatment for MRSA infections. Performed at [facility], [place] [address], [place], [place] [address]      Time coordinating discharge: Over 30 minutes  SIGNED:   Georgette Shell, MD  [facility] 03/17/2017, 9:59 AM Pager   If 7PM-7AM, please contact night-coverage [email or web address] Password [email or web address]

## 2017-03-17 NOTE — Progress Notes (Signed)
NEUROSURGERY PROGRESS NOTE  Doing well. Complains of appropriate headache No numbness, tingling or weakness Ambulating and voiding well Good strength and sensation Incision CDI, no drainage  Temp:  [98 F (36.7 C)-98.7 F (37.1 C)] 98 F (36.7 C) (03/01 1215) Pulse Rate:  [68-79] 77 (03/01 0847) Resp:  [13-41] 41 (03/01 0800) BP: (112-185)/(74-111) 112/74 (03/01 1100) SpO2:  [92 %-95 %] 93 % (03/01 0800) Weight:  [104.2 kg (229 lb 11.5 oz)] 104.2 kg (229 lb 11.5 oz) (03/01 0600)  Plan: Continue pain management, PT Denise Chiquito, NP 03/17/2017 4:15 PM

## 2017-03-17 NOTE — Care Management Important Message (Signed)
Important Message  Patient Details  Name: Denise Morton MRN: [id number] Date of Birth: [birth year]   Medicare Important Message Given:  Yes    Ella Bodo, RN 03/17/2017, 2:12 PM

## 2017-03-17 NOTE — Social Work (Addendum)
CSW spoke with pt insurance, still processing pt request for SNF placement.  CSW to f/u with pt regarding pt choice.   3:03pm- CSW called pt son to speak with him about discharge planning per pt request. CSW awaiting call back.  4:00pm- Pt family would like to accept placement at [facility] aware and able to take pt, still waiting on insurance authorization from [employer].   CSW continuing to follow.   Alexander Mt, [place] Work [phone]

## 2017-03-18 LAB — GLUCOSE, CAPILLARY
GLUCOSE-CAPILLARY: 300 mg/dL — AB (ref 65–99)
Glucose-Capillary: 184 mg/dL — ABNORMAL HIGH (ref 65–99)
Glucose-Capillary: 213 mg/dL — ABNORMAL HIGH (ref 65–99)
Glucose-Capillary: 236 mg/dL — ABNORMAL HIGH (ref 65–99)

## 2017-03-18 MED ORDER — BISACODYL 10 MG RE SUPP
10.0000 mg | Freq: Once | RECTAL | Status: DC
  Filled 2017-03-18: qty 1

## 2017-03-18 MED ORDER — BISACODYL 5 MG PO TBEC
10.0000 mg | DELAYED_RELEASE_TABLET | ORAL | Status: AC
  Administered 2017-03-18: 10 mg via ORAL
  Filled 2017-03-18: qty 2

## 2017-03-18 MED ORDER — BISACODYL 5 MG PO TBEC: 10.0000 mg | DELAYED_RELEASE_TABLET | Freq: Every day | ORAL | 0 refills | Status: AC | PRN

## 2017-03-18 MED ORDER — BISACODYL 5 MG PO TBEC: 5.0000 mg | DELAYED_RELEASE_TABLET | Freq: Every day | ORAL | 1 refills | Status: AC | PRN

## 2017-03-18 MED ORDER — LACTULOSE 10 GM/15ML PO SOLN
30.0000 g | ORAL | Status: AC
  Administered 2017-03-18 (×2): 30 g via ORAL
  Filled 2017-03-18: qty 45

## 2017-03-18 NOTE — Progress Notes (Signed)
Large amt drainage noted from posterior occipital incision.  Neuro will re-assess prior to any potential discharge.

## 2017-03-18 NOTE — Progress Notes (Signed)
Subjective: Patient reports leaking along incision  Objective: Vital signs in last 24 hours: Temp:  [97.6 F (36.4 C)-98.2 F (36.8 C)] 98 F (36.7 C) (03/02 1227) Pulse Rate:  [63-80] 67 (03/02 1131) Resp:  [10-16] 14 (03/02 1131) BP: (121-169)/(64-95) 156/89 (03/02 1227) SpO2:  [90 %-95 %] 95 % (03/02 1131) Weight:  [103.3 kg (227 lb 11.8 oz)] 103.3 kg (227 lb 11.8 oz) (03/02 0500)  Intake/Output from previous day: 03/01 0701 - 03/02 0700 In: 1200 [P.O.:1200] Out: 650 [Urine:650] Intake/Output this shift: No intake/output data recorded.  Physical Exam: Patient was laying flatter and noticed drainage from neck.  Currently sitting up and incision is dry.  No other current complaints.  Denies headache.  Lab Results: Recent Labs    03/17/17 0554  WBC 10.1  HGB 8.5*  HCT 27.3*  PLT 159   BMET Recent Labs    03/17/17 0554  NA 142  K 4.0  CL 107  CO2 24  GLUCOSE 271*  BUN 30*  CREATININE 0.90  CALCIUM 9.0    Studies/Results: No results found.  Assessment/Plan: Patient had drainage from incision earlier today.  Currently dry.  Will have patient sit upright today and sleep with HOB 45 + degrees and see if drainage stops.  If not, patient may require wound revision.    LOS: 11 days    Peggyann Shoals, MD 03/18/2017, 1:06 PM

## 2017-03-18 NOTE — Progress Notes (Addendum)
9:24am- CSW was informed that pt is starting to leak fluid from neck and has to be seen again by neuro and then cleared. CSW will continue to follow for needs at this time.  CSW informed that authorization was pending as of yesterday. CSW spoke with Jackelyn Poling and Vinnie Level with [employer] and was informed that MD has approved pt for 7 days at [facility]. Authorization umber is [id number] and [employer] is to send clinicals on 03/20/17. CSW has spoken with RN, pt at bedside, as well as updated Santiago Glad at [facility]. PTAR is set up for 11am. There are no further CSW needs, CSW signing goff.     Virgie Dad Mickenzie Stolar, MSW, [place] [facility] Clinical Social Worker [phone]

## 2017-03-18 NOTE — Progress Notes (Signed)
PROGRESS NOTE    Denise Morton  GGY:[id number] DOB: 10-10-1950 DOA: 03/07/2017 PCP: Seward Carol, MD   Brief Narrative: 67 y.o.femaleWith pmhx of BRCA, DM, admitted on 03/07/17 with weakness, dizziness, anorexia speech concerns and near syncope. 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 She was initially admitted to [facility], neurosurgery was consulted and patient was transferred to [facility] on 2/23 for plan for surgical removal by neurosurgery.Patient underwent suboccipital craniectomy for resection of right cerebellar meton 2/25 by Dr. Saintclair Halsted  03/16/2017-patient doing well denies any specific complaints today other than headache.  She was sitting up in the bedside commode when I saw her.  She had a bowel movement yesterday.    Assessment & Plan:   Active Problems:   Syncope   Brain metastases (HCC)   Acute blood loss anemia   Diabetes mellitus type 2 in obese (HCC)   Hyperkalemia   Leukocytosis   Post-operative pain   Steroid-induced hyperglycemia   Vasogenic edema (HCC) Brain metastasis in the setting of breast cancer -Neurosurgery consulted,she is now status post suboccipital craniotomy and resection of the right cerebellar mass.  Patient with serous drainage from the inferior aspect of her incision status post Dermabond.  Neurosurgery following.  Continue Keppra. -Radiation oncology consulted, oncology consulted -Continue Decadron per neurosurgery  Type 2 diabetes mellitus -Given Decadron, patient persistently hyperglycemic, will increase Lantus to 15 units. -Continue scheduled for units with meals plus sliding scale  Hypertension -Blood pressure this morning is elevated.  Patient is on labetalol to 10 minutes  As needed.  Continue Norvasc, Coreg.  Acute encephalopathy-resolving      DVT prophylaxis: SCD Code Status full Family Communication: No family available Disposition Plan: TBD  Consultants:  Neurosurgery    Procedures: Resection of right cerebellar metastatic lesion on 03/13/2017.  Antimicrobials: None Subjective: Resting in bed in no acute distress..  Objective: Vitals:   03/17/17 2300 03/18/17 0400 03/18/17 0500 03/18/17 0800  BP: (!) 141/79 (!) 169/95    Pulse: 68 63    Resp: 12 13    Temp: 97.7 F (36.5 C)  98 F (36.7 C) 97.6 F (36.4 C)  TempSrc: Axillary  Oral Oral  SpO2: 94% 92%    Weight:   103.3 kg (227 lb 11.8 oz)   Height:        Intake/Output Summary (Last 24 hours) at 03/18/2017 1108 Last data filed at 03/18/2017 0350 Gross per 24 hour  Intake 840 ml  Output 650 ml  Net 190 ml   Filed Weights   03/16/17 0500 03/17/17 0600 03/18/17 0500  Weight: 102.9 kg (226 lb 13.7 oz) 104.2 kg (229 lb 11.5 oz) 103.3 kg (227 lb 11.8 oz)    Examination:  General exam: Appears calm and comfortable  Respiratory system: Clear to auscultation. Respiratory effort normal. Cardiovascular system: S1 & S2 heard, RRR. No JVD, murmurs, rubs, gallops or clicks. No pedal edema. Gastrointestinal system: Abdomen is nondistended, soft and nontender. No organomegaly or masses felt. Normal bowel sounds heard. Central nervous system: Alert and oriented. No focal neurological deficits. Extremities: Symmetric 5 x 5 power. Skin: No rashes, lesions or ulcers Psychiatry: Judgement and insight appear normal. Mood & affect appropriate.     Data Reviewed: I have personally reviewed following labs and imaging studies  CBC: Recent Labs  Lab 03/13/17 0406 03/13/17 0837 03/13/17 1207 03/13/17 1426 03/13/17 1848 03/17/17 0554  WBC 7.3  --   --   --  12.8* 10.1  NEUTROABS  --   --   --   --   --  8.7*  HGB 14.0 13.3 11.2* 9.9* 10.6* 8.5*  HCT 43.0 39.0 33.0* 29.0* 33.7* 27.3*  MCV 94.7  --   --   --  96.3 95.5  PLT 187  --   --   --  175 092   Basic Metabolic Panel: Recent Labs  Lab 03/13/17 0406 03/13/17 0837 03/13/17 1207 03/13/17 1426 03/13/17 1848 03/14/17 0421  03/17/17 0554  NA 136 139 141 140 142 143 142  K 4.8 3.9 4.2 4.5 4.8 5.2* 4.0  CL 102  --   --   --  112* 114* 107  CO2 22  --   --   --  17* 21* 24  GLUCOSE 286* 230*  --   --  223* 208* 271*  BUN 32*  --   --   --  30* 34* 30*  CREATININE 0.90  --   --   --  1.01* 0.97 0.90  CALCIUM 9.3  --   --   --  8.1* 8.3* 9.0   GFR: Estimated Creatinine Clearance: 76 mL/min (by C-G formula based on SCr of 0.9 mg/dL). Liver Function Tests: No results for input(s): AST, ALT, ALKPHOS, BILITOT, PROT, ALBUMIN in the last 168 hours. No results for input(s): LIPASE, AMYLASE in the last 168 hours. No results for input(s): AMMONIA in the last 168 hours. Coagulation Profile: No results for input(s): INR, PROTIME in the last 168 hours. Cardiac Enzymes: No results for input(s): CKTOTAL, CKMB, CKMBINDEX, TROPONINI in the last 168 hours. BNP (last 3 results) No results for input(s): PROBNP in the last 8760 hours. HbA1C: No results for input(s): HGBA1C in the last 72 hours. CBG: Recent Labs  Lab 03/17/17 0838 03/17/17 1214 03/17/17 1635 03/17/17 2118 03/18/17 0738  GLUCAP 244* 276* 242* 188* 184*   Lipid Profile: No results for input(s): CHOL, HDL, LDLCALC, TRIG, CHOLHDL, LDLDIRECT in the last 72 hours. Thyroid Function Tests: No results for input(s): TSH, T4TOTAL, FREET4, T3FREE, THYROIDAB in the last 72 hours. Anemia Panel: No results for input(s): VITAMINB12, FOLATE, FERRITIN, TIBC, IRON, RETICCTPCT in the last 72 hours. Sepsis Labs: No results for input(s): PROCALCITON, LATICACIDVEN in the last 168 hours.  Recent Results (from the past 240 hour(s))  Surgical PCR screen     Status: Abnormal   Collection Time: 03/11/17 11:03 AM  Result Value Ref Range Status   MRSA, PCR NEGATIVE NEGATIVE Final   Staphylococcus aureus POSITIVE (A) NEGATIVE Final    Comment: (NOTE) The Xpert SA Assay (FDA approved for NASAL specimens in patients 90 years of age and older), is one component of a  comprehensive surveillance program. It is not intended to diagnose infection nor to guide or monitor treatment. Performed at [facility], [place] [address], [place], [place] [address]   MRSA PCR Screening     Status: None   Collection Time: 03/11/17  5:10 PM  Result Value Ref Range Status   MRSA by PCR NEGATIVE NEGATIVE Final    Comment:        The GeneXpert MRSA Assay (FDA approved for NASAL specimens only), is one component of a comprehensive MRSA colonization surveillance program. It is not intended to diagnose MRSA infection nor to guide or monitor treatment for MRSA infections. Performed at [facility], [place] [address] [place], [place] [address]          Radiology Studies: No results found.  Scheduled Meds: . amLODipine  10 mg Oral Daily  . carvedilol  12.5 mg Oral BID WC  . chlorhexidine  15 mL Mouth Rinse BID  . Chlorhexidine Gluconate Cloth  6 each Topical Q0600  . dexamethasone  4 mg Oral Q8H  . docusate sodium  100 mg Oral BID  . feeding supplement (ENSURE ENLIVE)  237 mL Oral BID BM  . insulin aspart  0-20 Units Subcutaneous TID WC  . insulin aspart  4 Units Subcutaneous TID WC  . insulin glargine  15 Units Subcutaneous QHS  . levETIRAcetam  500 mg Oral BID  . mouth rinse  15 mL Mouth Rinse q12n4p  . mirtazapine  15 mg Oral QHS  . nystatin-triamcinolone   Topical BID  . pantoprazole  40 mg Oral Daily  . sodium chloride flush  10-40 mL Intracatheter Q12H   Continuous Infusions: . sodium chloride 10 mL/hr at 03/15/17 1745     LOS: 11 days     Georgette Shell, MD  If 7PM-7AM, please contact night-coverage [email or web address] Password [email or web address] 03/18/2017, 11:08 AM

## 2017-03-18 NOTE — Progress Notes (Addendum)
CSW was informed by RN that RN was informed that pt would not be discharge today per MD due to drainage at incision site. Pt needing to be assessed again my neuro and then could potentially go to [facility]. CSW will continue to follow further needs at this time.      Virgie Dad Ashvin Adelson, MSW, [place] [facility] Clinical Social Worker [phone]

## 2017-03-18 NOTE — Plan of Care (Signed)
  Progressing Education: Knowledge of General Education information will improve 03/18/2017 0152 - Progressing by Randal Buba, RN Health Behavior/Discharge Planning: Ability to manage health-related needs will improve 03/18/2017 0152 - Progressing by Randal Buba, RN Clinical Measurements: Ability to maintain clinical measurements within normal limits will improve 03/18/2017 0152 - Progressing by Randal Buba, RN Will remain free from infection 03/18/2017 0152 - Progressing by Randal Buba, RN Diagnostic test results will improve 03/18/2017 0152 - Progressing by Randal Buba, RN Respiratory complications will improve 03/18/2017 0152 - Progressing by Randal Buba, RN Cardiovascular complication will be avoided 03/18/2017 0152 - Progressing by Randal Buba, RN Activity: Risk for activity intolerance will decrease 03/18/2017 0152 - Progressing by Randal Buba, RN Nutrition: Adequate nutrition will be maintained 03/18/2017 0152 - Progressing by Randal Buba, RN Coping: Level of anxiety will decrease 03/18/2017 0152 - Progressing by Randal Buba, RN Elimination: Will not experience complications related to bowel motility 03/18/2017 0152 - Progressing by Randal Buba, RN Will not experience complications related to urinary retention 03/18/2017 0152 - Progressing by Randal Buba, RN Pain Managment: General experience of comfort will improve 03/18/2017 0152 - Progressing by Randal Buba, RN Safety: Ability to remain free from injury will improve 03/18/2017 0152 - Progressing by Randal Buba, RN Skin Integrity: Risk for impaired skin integrity will decrease 03/18/2017 0152 - Progressing by Randal Buba, RN

## 2017-03-19 LAB — GLUCOSE, CAPILLARY
GLUCOSE-CAPILLARY: 220 mg/dL — AB (ref 65–99)
GLUCOSE-CAPILLARY: 325 mg/dL — AB (ref 65–99)
GLUCOSE-CAPILLARY: 306 mg/dL — AB (ref 65–99)
GLUCOSE-CAPILLARY: 240 mg/dL — AB (ref 65–99)

## 2017-03-19 MED ORDER — INSULIN ASPART 100 UNIT/ML ~~LOC~~ SOLN
6.0000 [IU] | Freq: Three times a day (TID) | SUBCUTANEOUS | Status: DC
  Administered 2017-03-19 – 2017-03-21 (×7): 6 [IU] via SUBCUTANEOUS

## 2017-03-19 MED ORDER — INSULIN GLARGINE 100 UNIT/ML ~~LOC~~ SOLN
20.0000 [IU] | Freq: Every day | SUBCUTANEOUS | Status: DC
  Administered 2017-03-19 – 2017-03-21 (×3): 20 [IU] via SUBCUTANEOUS
  Filled 2017-03-19 (×3): qty 0.2

## 2017-03-19 MED ORDER — FERROUS SULFATE 325 (65 FE) MG PO TABS
325.0000 mg | ORAL_TABLET | Freq: Two times a day (BID) | ORAL | Status: DC
  Administered 2017-03-19 – 2017-04-14 (×50): 325 mg via ORAL
  Filled 2017-03-19 (×50): qty 1

## 2017-03-19 NOTE — Progress Notes (Signed)
Subjective: Patient reports dressing is dry  Objective: Vital signs in last 24 hours: Temp:  [97.7 F (36.5 C)-98 F (36.7 C)] 97.7 F (36.5 C) (03/03 0846) Pulse Rate:  [63-85] 63 (03/03 1200) Resp:  [13-17] 17 (03/03 1200) BP: (135-167)/(75-110) 135/75 (03/03 1200) SpO2:  [96 %-98 %] 97 % (03/03 1200) Weight:  [101.7 kg (224 lb 3.3 oz)] 101.7 kg (224 lb 3.3 oz) (03/03 0500)  Intake/Output from previous day: 03/02 0701 - 03/03 0700 In: 717 [P.O.:717] Out: -  Intake/Output this shift: No intake/output data recorded.  Physical Exam: Dressing on back of head is dry.  Lab Results: Recent Labs    03/17/17 0554  WBC 10.1  HGB 8.5*  HCT 27.3*  PLT 159   BMET Recent Labs    03/17/17 0554  NA 142  K 4.0  CL 107  CO2 24  GLUCOSE 271*  BUN 30*  CREATININE 0.90  CALCIUM 9.0    Studies/Results: No results found.  Assessment/Plan: Patient had some wound drainage yesterday, but none today.  I have advised patient to remain upright as much as possible today.  If drainage reocurrs, she may require wound revision.    LOS: 12 days    Peggyann Shoals, MD 03/19/2017, 12:49 PM

## 2017-03-19 NOTE — Progress Notes (Signed)
PROGRESS NOTE    Denise Morton  BHA:[id number] DOB: [birth year] DOA: 03/07/2017 PCP: Seward Carol, MD   Brief Narrative: 67 y.o.femaleWith pmhx of BRCA, DM, admitted on 03/07/17 with weakness, dizziness, anorexia speech concerns and near syncope. 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 She was initially admitted to [facility], neurosurgery was consulted and patient was transferred to [facility] on 2/23 for plan for surgical removal by neurosurgery.Patient underwent suboccipital craniectomy for resection of right cerebellar meton 2/25 by Dr. Saintclair Halsted  03/16/2017-patient doing well denies any specific complaints today other than headache. She was sitting up in the bedside commode when I saw her. She had a bowel movement yesterday.  03/19/2017 patient is still having large amounts of serous drainage from the back incision.  Here she reports her headache is better.  She slept all night sitting up.  Patient ambulated yesterday.  She had good bowel movements yesterday.   Assessment & Plan:   Active Problems:   Syncope   Brain metastases (HCC)   Acute blood loss anemia   Diabetes mellitus type 2 in obese (HCC)   Hyperkalemia   Leukocytosis   Post-operative pain   Steroid-induced hyperglycemia   Vasogenic edema (HCC)  1] metastatic breast cancer with metastases to the brain status post suboccipital craniotomy and resection of the right cerebellar mass.  She has staples to the back of her head.  Patient has been draining serous drainage from the inferior aspect of her incision.  Neurosurgery following.  Her discharge will be canceled today.  Continue Keppra and Decadron.?  May require wound revision.  Patient has been sitting up all night and sleeping with actually increased drainage.  2] type 2 diabetes blood sugar stable. increase 15 units and sliding scale insulin.  Patient is on Decadron.  3] hypertension blood pressure better on  Norvasc and Coreg continue as needed labetalol.  4] anemia noted drop in hemoglobin check guaiac stools.  Monitor H&H start iron tablets.  DVT prophylaxis: SCD Code Status: Full code Family Communication: No family available Disposition Plan: Once neurosurgery approves of discharge the patient.  Cancel discharge for today.  Consultants: Neurosurgery  Procedures: Resection of right cerebellar mass 03/13/2017 Antimicrobials: None Subjective: Patient is actually feeling better other than increased drainage from the back of her head.  She had huge bowel movement.  Objective: Vitals:   03/19/17 0400 03/19/17 0500 03/19/17 0844 03/19/17 0846  BP: (!) 156/93  (!) 167/87   Pulse: 66  85   Resp: 14     Temp:    97.7 F (36.5 C)  TempSrc:      SpO2: 96%     Weight:  101.7 kg (224 lb 3.3 oz)    Height:        Intake/Output Summary (Last 24 hours) at 03/19/2017 1150 Last data filed at 03/19/2017 0400 Gross per 24 hour  Intake 717 ml  Output -  Net 717 ml   Filed Weights   03/17/17 0600 03/18/17 0500 03/19/17 0500  Weight: 104.2 kg (229 lb 11.5 oz) 103.3 kg (227 lb 11.8 oz) 101.7 kg (224 lb 3.3 oz)    Examination: Drainage noted on the dressings in the back of her neck.  General exam: Appears calm and comfortable  Respiratory system: Clear to auscultation. Respiratory effort normal. Cardiovascular system: S1 & S2 heard, RRR. No JVD, murmurs, rubs, gallops or clicks. No pedal edema. Gastrointestinal system: Abdomen is nondistended, soft and nontender. No organomegaly or masses  felt. Normal bowel sounds heard. Central nervous system: Alert and oriented. No focal neurological deficits. Extremities: Symmetric 5 x 5 power. Skin: No rashes, lesions or ulcers Psychiatry: Judgement and insight appear normal. Mood & affect appropriate.     Data Reviewed: I have personally reviewed following labs and imaging studies  CBC: Recent Labs  Lab 03/13/17 0406 03/13/17 0837 03/13/17 1207  03/13/17 1426 03/13/17 1848 03/17/17 0554  WBC 7.3  --   --   --  12.8* 10.1  NEUTROABS  --   --   --   --   --  8.7*  HGB 14.0 13.3 11.2* 9.9* 10.6* 8.5*  HCT 43.0 39.0 33.0* 29.0* 33.7* 27.3*  MCV 94.7  --   --   --  96.3 95.5  PLT 187  --   --   --  175 458   Basic Metabolic Panel: Recent Labs  Lab 03/13/17 0406 03/13/17 0837 03/13/17 1207 03/13/17 1426 03/13/17 1848 03/14/17 0421 03/17/17 0554  NA 136 139 141 140 142 143 142  K 4.8 3.9 4.2 4.5 4.8 5.2* 4.0  CL 102  --   --   --  112* 114* 107  CO2 22  --   --   --  17* 21* 24  GLUCOSE 286* 230*  --   --  223* 208* 271*  BUN 32*  --   --   --  30* 34* 30*  CREATININE 0.90  --   --   --  1.01* 0.97 0.90  CALCIUM 9.3  --   --   --  8.1* 8.3* 9.0   GFR: Estimated Creatinine Clearance: 75.3 mL/min (by C-G formula based on SCr of 0.9 mg/dL). Liver Function Tests: No results for input(s): AST, ALT, ALKPHOS, BILITOT, PROT, ALBUMIN in the last 168 hours. No results for input(s): LIPASE, AMYLASE in the last 168 hours. No results for input(s): AMMONIA in the last 168 hours. Coagulation Profile: No results for input(s): INR, PROTIME in the last 168 hours. Cardiac Enzymes: No results for input(s): CKTOTAL, CKMB, CKMBINDEX, TROPONINI in the last 168 hours. BNP (last 3 results) No results for input(s): PROBNP in the last 8760 hours. HbA1C: No results for input(s): HGBA1C in the last 72 hours. CBG: Recent Labs  Lab 03/18/17 0738 03/18/17 1128 03/18/17 1647 03/18/17 2144 03/19/17 0740  GLUCAP 184* 300* 213* 236* 220*   Lipid Profile: No results for input(s): CHOL, HDL, LDLCALC, TRIG, CHOLHDL, LDLDIRECT in the last 72 hours. Thyroid Function Tests: No results for input(s): TSH, T4TOTAL, FREET4, T3FREE, THYROIDAB in the last 72 hours. Anemia Panel: No results for input(s): VITAMINB12, FOLATE, FERRITIN, TIBC, IRON, RETICCTPCT in the last 72 hours. Sepsis Labs: No results for input(s): PROCALCITON, LATICACIDVEN in the last  168 hours.  Recent Results (from the past 240 hour(s))  Surgical PCR screen     Status: Abnormal   Collection Time: 03/11/17 11:03 AM  Result Value Ref Range Status   MRSA, PCR NEGATIVE NEGATIVE Final   Staphylococcus aureus POSITIVE (A) NEGATIVE Final    Comment: (NOTE) The Xpert SA Assay (FDA approved for NASAL specimens in patients 22 years of age and older), is one component of a comprehensive surveillance program. It is not intended to diagnose infection nor to guide or monitor treatment. Performed at [facility], [place] [address], [place], [place] [address]   MRSA PCR Screening     Status: None   Collection Time: 03/11/17  5:10 PM  Result Value Ref Range  Status   MRSA by PCR NEGATIVE NEGATIVE Final    Comment:        The GeneXpert MRSA Assay (FDA approved for NASAL specimens only), is one component of a comprehensive MRSA colonization surveillance program. It is not intended to diagnose MRSA infection nor to guide or monitor treatment for MRSA infections. Performed at [facility], [place] [address] [place], [place] [address]          Radiology Studies: No results found.      Scheduled Meds: . amLODipine  10 mg Oral Daily  . bisacodyl  10 mg Rectal Once  . carvedilol  12.5 mg Oral BID WC  . chlorhexidine  15 mL Mouth Rinse BID  . Chlorhexidine Gluconate Cloth  6 each Topical Q0600  . dexamethasone  4 mg Oral Q8H  . docusate sodium  100 mg Oral BID  . feeding supplement (ENSURE ENLIVE)  237 mL Oral BID BM  . insulin aspart  0-20 Units Subcutaneous TID WC  . insulin aspart  4 Units Subcutaneous TID WC  . insulin glargine  15 Units Subcutaneous QHS  . levETIRAcetam  500 mg Oral BID  . mouth rinse  15 mL Mouth Rinse q12n4p  . mirtazapine  15 mg Oral QHS  . nystatin-triamcinolone   Topical BID  . pantoprazole  40 mg Oral Daily  . sodium chloride flush  10-40 mL Intracatheter Q12H   Continuous Infusions: . sodium chloride Stopped  (03/18/17 1900)     LOS: 12 days      Georgette Shell, MD [facility]  If 7PM-7AM, please contact night-coverage [email or web address] Password [email or web address] 03/19/2017, 11:50 AM

## 2017-03-19 NOTE — Progress Notes (Signed)
Patient's neck incision is still draining copious amounts of serous drainage. RN changed dressing at [place], 2215, [place] and 0645. Patient not complaining of any pain at incision site and does not report any headaches. Patient ambulating to bathroom and around room very well with front wheel walker with just stand by assist.

## 2017-03-20 LAB — GLUCOSE, CAPILLARY
Glucose-Capillary: 203 mg/dL — ABNORMAL HIGH (ref 65–99)
Glucose-Capillary: 244 mg/dL — ABNORMAL HIGH (ref 65–99)
Glucose-Capillary: 215 mg/dL — ABNORMAL HIGH (ref 65–99)
Glucose-Capillary: 276 mg/dL — ABNORMAL HIGH (ref 65–99)

## 2017-03-20 MED ORDER — FERROUS SULFATE 325 (65 FE) MG PO TABS: 325.0000 mg | ORAL_TABLET | Freq: Two times a day (BID) | ORAL | 3 refills | Status: AC

## 2017-03-20 MED ORDER — INSULIN ASPART 100 UNIT/ML ~~LOC~~ SOLN: 6.0000 [IU] | Freq: Three times a day (TID) | SUBCUTANEOUS | 11 refills | Status: AC

## 2017-03-20 MED ORDER — DOXYCYCLINE HYCLATE 100 MG PO TABS
100.0000 mg | ORAL_TABLET | Freq: Two times a day (BID) | ORAL | Status: DC
  Administered 2017-03-20 – 2017-03-22 (×6): 100 mg via ORAL
  Filled 2017-03-20 (×7): qty 1

## 2017-03-20 MED ORDER — INSULIN GLARGINE 100 UNIT/ML ~~LOC~~ SOLN: 20.0000 [IU] | Freq: Every day | SUBCUTANEOUS | 11 refills | Status: AC

## 2017-03-20 MED ORDER — CARVEDILOL 25 MG PO TABS
25.0000 mg | ORAL_TABLET | Freq: Two times a day (BID) | ORAL | Status: DC
  Administered 2017-03-20 – 2017-03-27 (×13): 25 mg via ORAL
  Filled 2017-03-20 (×2): qty 1
  Filled 2017-03-20: qty 2
  Filled 2017-03-20: qty 1
  Filled 2017-03-20: qty 2
  Filled 2017-03-20 (×4): qty 1
  Filled 2017-03-20: qty 2
  Filled 2017-03-20 (×3): qty 1

## 2017-03-20 MED ORDER — DOXYCYCLINE HYCLATE 100 MG PO TABS: 100.0000 mg | ORAL_TABLET | Freq: Two times a day (BID) | ORAL | 0 refills | Status: AC

## 2017-03-20 MED ORDER — CARVEDILOL 25 MG PO TABS: 25.0000 mg | ORAL_TABLET | Freq: Two times a day (BID) | ORAL | 0 refills | Status: AC

## 2017-03-20 NOTE — Progress Notes (Signed)
PROGRESS NOTE    Denise Morton  BWG:[id number] DOB: 1950/11/29 DOA: 03/07/2017 PCP: Seward Carol, MD  Brief Narrative: 67 y.o.femaleWith pmhx of BRCA, DM, admitted on 03/07/17 with weakness, dizziness, anorexia speech concerns and near syncope. 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 She was initially admitted to [facility], neurosurgery was consulted and patient was transferred to [facility] on 2/23 for plan for surgical removal by neurosurgery.Patient underwent suboccipital craniectomy for resection of right cerebellar meton 2/25 by Dr. Saintclair Halsted  03/16/2017-patient doing well denies any specific complaints today other than headache. She was sitting up in the bedside commode when I saw her. She had a bowel movement yesterday.  03/19/2017 patient is still having large amounts of serous drainage from the back incision.  Here she reports her headache is better.  She slept all night sitting up.  Patient ambulated yesterday.  She had good bowel movements yesterday. 03/20/2017 night nurse reported that she change the dressing twice.  Patient had a BM yesterday.    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  She has staples to the back of her head.  Patient has been draining serous drainage from the inferior aspect of her incision.  Neurosurgery following.  Continue Keppra and Decadron.?  May require wound revision.  Patient has been sitting up all night and sleeping.  It appears that the drainage has been decreased today compared to yesterday.  Will wait for final decision making by neurosurgery.  2]  type 2 diabetes blood sugar stable. increase 15 units and sliding scale insulin.  Patient is on Decadron.  3] hypertension blood pressure better on Norvasc and Coreg continue as needed labetalol.  BP remains elevated.  Increase the dose of Coreg to 25 mg twice a day.  4] anemia noted drop in hemoglobin check guaiac stools.  Monitor H&H start iron tablets.      DVT prophylaxis:scd Code Status:full  Family Communication: None Disposition Plan: Pending neurosurgery plans Consultants:  Neurosurgery Procedures: Resection of right cerebellar mass 03/13/2017. Antimicrobials: None Subjective: Complaining of a headache and drainage from the back of her head.  Objective: Vitals:   03/20/17 0300 03/20/17 0812 03/20/17 0820 03/20/17 0914  BP:  (!) 182/90  (!) 188/117  Pulse:  80    Resp:      Temp: 98.4 F (36.9 C)  97.9 F (36.6 C)   TempSrc: Oral     SpO2:      Weight:      Height:        Intake/Output Summary (Last 24 hours) at 03/20/2017 1031 Last data filed at 03/19/2017 1914 Gross per 24 hour  Intake 720 ml  Output 0 ml  Net 720 ml   Filed Weights   03/17/17 0600 03/18/17 0500 03/19/17 0500  Weight: 104.2 kg (229 lb 11.5 oz) 103.3 kg (227 lb 11.8 oz) 101.7 kg (224 lb 3.3 oz)    Examination:  General exam: Appears calm and comfortable  Respiratory system: Clear to auscultation. Respiratory effort normal. Cardiovascular system: S1 & S2 heard, RRR. No JVD, murmurs, rubs, gallops or clicks. No pedal edema. Gastrointestinal  system: Abdomen is nondistended, soft and nontender. No organomegaly or masses felt. Normal bowel sounds heard. Central nervous system: Alert and oriented. No focal neurological deficits. Extremities: Symmetric 5 x 5 power. Skin: No rashes, lesions or ulcers Psychiatry: Judgement and insight appear normal. Mood & affect appropriate.     Data Reviewed: I have personally reviewed following labs and imaging studies  CBC: Recent Labs  Lab  03/13/17 1207 03/13/17 1426 03/13/17 1848 03/17/17 0554  WBC  --   --  12.8* 10.1  NEUTROABS  --   --   --  8.7*  HGB 11.2* 9.9* 10.6* 8.5*  HCT 33.0* 29.0* 33.7* 27.3*  MCV  --   --  96.3 95.5  PLT  --   --  175 408   Basic Metabolic Panel: Recent Labs  Lab 03/13/17 1207 03/13/17 1426 03/13/17 1848 03/14/17 0421 03/17/17 0554  NA 141 140 142 143 142  K 4.2 4.5 4.8 5.2* 4.0  CL  --   --  112* 114* 107  CO2  --   --  17* 21* 24  GLUCOSE  --   --  223* 208* 271*  BUN  --   --  30* 34* 30*  CREATININE  --   --  1.01* 0.97 0.90  CALCIUM  --   --  8.1* 8.3* 9.0   GFR: Estimated Creatinine Clearance: 75.3 mL/min (by C-G formula based on SCr of 0.9 mg/dL). Liver Function Tests: No results for input(s): AST, ALT, ALKPHOS, BILITOT, PROT, ALBUMIN in the last 168 hours. No results for input(s): LIPASE, AMYLASE in the last 168 hours. No results for input(s): AMMONIA in the last 168 hours. Coagulation Profile: No results for input(s): INR, PROTIME in the last 168 hours. Cardiac Enzymes: No results for input(s): CKTOTAL, CKMB, CKMBINDEX, TROPONINI in the last 168 hours. BNP (last 3 results) No results for input(s): PROBNP in the last 8760 hours. HbA1C: No results for input(s): HGBA1C in the last 72 hours. CBG: Recent Labs  Lab 03/19/17 0740 03/19/17 1148 03/19/17 1801 03/19/17 2155 03/20/17 0801  GLUCAP 220* 325* 306* 240* 203*   Lipid Profile: No results for input(s): CHOL, HDL, LDLCALC, TRIG, CHOLHDL, LDLDIRECT in the last 72 hours. Thyroid Function Tests: No results for input(s): TSH, T4TOTAL, FREET4, T3FREE, THYROIDAB in the last 72 hours. Anemia Panel: No results for input(s): VITAMINB12, FOLATE, FERRITIN, TIBC, IRON, RETICCTPCT in the last 72 hours. Sepsis Labs: No results for input(s): PROCALCITON, LATICACIDVEN in the last 168 hours.  Recent Results (from the past 240 hour(s))  Surgical PCR screen     Status: Abnormal   Collection Time: 03/11/17 11:03 AM   Result Value Ref Range Status   MRSA, PCR NEGATIVE NEGATIVE Final   Staphylococcus aureus POSITIVE (A) NEGATIVE Final    Comment: (NOTE) The Xpert SA Assay (FDA approved for NASAL specimens in patients 74 years of age and older), is one component of a comprehensive surveillance program. It is not intended to diagnose infection nor to guide or monitor treatment. Performed at [facility], [place] [address], [place], [place] [address]   MRSA PCR Screening     Status: None   Collection Time: 03/11/17  5:10 PM  Result Value Ref Range Status   MRSA by PCR NEGATIVE NEGATIVE Final    Comment:        The GeneXpert MRSA Assay (FDA approved for NASAL specimens only), is one component of a comprehensive MRSA colonization surveillance program. It is not intended to  diagnose MRSA infection nor to guide or monitor treatment for MRSA infections. Performed at [facility], [place] [address], [place], [place] [address]          Radiology Studies: No results found.      Scheduled Meds: . amLODipine  10 mg Oral Daily  . bisacodyl  10 mg Rectal Once  . carvedilol  12.5 mg Oral BID WC  . chlorhexidine  15 mL Mouth Rinse BID  . Chlorhexidine Gluconate Cloth  6 each Topical Q0600  . dexamethasone  4 mg Oral Q8H  . docusate sodium  100 mg Oral BID  . doxycycline  100 mg Oral Q12H  . feeding supplement (ENSURE ENLIVE)  237 mL Oral BID BM  . ferrous sulfate  325 mg Oral BID WC  . insulin aspart  0-20 Units Subcutaneous TID WC  . insulin aspart  6 Units Subcutaneous TID WC  . insulin glargine  20 Units Subcutaneous QHS  . levETIRAcetam  500 mg Oral BID  . mouth rinse  15 mL Mouth Rinse q12n4p  . mirtazapine  15 mg Oral QHS  . nystatin-triamcinolone   Topical BID  . pantoprazole  40 mg Oral Daily  . sodium chloride flush  10-40 mL Intracatheter Q12H   Continuous Infusions: . sodium chloride Stopped (03/18/17 1900)     LOS: 13 days     Georgette Shell,  MD [facility]  If 7PM-7AM, please contact night-coverage [email or web address] Password [email or web address] 03/20/2017, 10:31 AM

## 2017-03-20 NOTE — Progress Notes (Signed)
Subjective: Patient reports mild headache last night. Mild drainage from site. Dressing changed once last night  Objective: Vital signs in last 24 hours: Temp:  [97.7 F (36.5 C)-98.4 F (36.9 C)] 98.4 F (36.9 C) (03/04 0300) Pulse Rate:  [63-85] 63 (03/03 1200) Resp:  [17] 17 (03/03 1200) BP: (135-167)/(75-87) 135/75 (03/03 1200) SpO2:  [97 %] 97 % (03/03 1200)  Intake/Output from previous day: 03/03 0701 - 03/04 0700 In: 720 [P.O.:720] Out: 0  Intake/Output this shift: No intake/output data recorded.  Neurologic: Grossly normal  Lab Results: Lab Results  Component Value Date   WBC 10.1 03/17/2017   HGB 8.5 (L) 03/17/2017   HCT 27.3 (L) 03/17/2017   MCV 95.5 03/17/2017   PLT 159 03/17/2017   Lab Results  Component Value Date   INR 1.15 08/25/2014   BMET Lab Results  Component Value Date   NA 142 03/17/2017   K 4.0 03/17/2017   CL 107 03/17/2017   CO2 24 03/17/2017   GLUCOSE 271 (H) 03/17/2017   BUN 30 (H) 03/17/2017   CREATININE 0.90 03/17/2017   CALCIUM 9.0 03/17/2017    Studies/Results: No results found.  Assessment/Plan: Sitting 45 degrees. Mild amount of drainage from site over night. Will discuss with Dr. Saintclair Halsted if wound revision is needed.   LOS: 13 days    Denise Morton 03/20/2017, 8:01 AM

## 2017-03-20 NOTE — Progress Notes (Signed)
Occupational Therapy Treatment Patient Details Name: Denise Morton MRN: [id number] DOB: 1950/06/25 Today's Date: 03/20/2017    History of present illness  Denise Morton is a 67 y.o. female admitted with weakness and gastritis found to have cerebellar and occipital massess s/p resection 2/25 of right cerebellar mass, pt also with mets to liver, lung and bone. PMHx of Breast CA, DM, central retinal vein occlusion right eye   OT comments  Pt demonstrates excellent progress.  Cognition is significantly improved.  She currently requires min A - min guard assist for ADLs, and min guard assist for functional mobility.  She fatigues with activity.   Continue to feel she will need 24 hour assist at discharge.  Recommend SNF level rehab to allow her to maximize safety and independence, as well as reduce risk of falls and readmissions.  Will continue to follow.   Follow Up Recommendations  SNF;Supervision/Assistance - 24 hour    Equipment Recommendations  3 in 1 bedside commode;Tub/shower seat    Recommendations for Other Services      Precautions / Restrictions Precautions Precautions: Fall       Mobility Bed Mobility Overal bed mobility: Needs Assistance Bed Mobility: Supine to Sit     Supine to sit: Min guard        Transfers Overall transfer level: Modified independent Equipment used: Rolling walker (2 wheeled) Transfers: Sit to/from [facility] Sit to Stand: Min guard Stand pivot transfers: Min guard       General transfer comment: verbal cues for sequencing, safety and hand placement     Balance Overall balance assessment: Needs assistance   Sitting balance-Leahy Scale: Fair     Standing balance support: During functional activity;Single extremity supported Standing balance-Leahy Scale: Poor Standing balance comment: requires UE support                            ADL either performed or assessed with clinical judgement   ADL Overall  ADL's : Needs assistance/impaired Eating/Feeding: Independent   Grooming: Wash/dry hands;Wash/dry face;Oral care;Applying deodorant;Min guard;Standing       Lower Body Bathing: Minimal assistance;Sit to/from stand       Lower Body Dressing: Minimal assistance;Sit to/from stand   Toilet Transfer: Min guard;Ambulation;Comfort height toilet;RW;Grab bars   Toileting- Clothing Manipulation and Hygiene: Min guard;Sit to/from stand       Functional mobility during ADLs: Min guard;Rolling walker       Vision   Additional Comments: Pt able to scan environment to locate needed ADL items and to locate room    Perception     Praxis      Cognition Arousal/Alertness: Awake/alert Behavior During Therapy: [facility] for tasks assessed/performed Overall Cognitive Status: Impaired/Different from baseline Area of Impairment: Attention;Problem solving;Safety/judgement                   Current Attention Level: Alternating Memory: Decreased short-term memory Following Commands: Follows multi-step commands inconsistently;Follows multi-step commands with increased time     Problem Solving: Difficulty sequencing General Comments: Pt significantly improved compared to eval.  Pt asking realistic questions about her potential ability to live independently in the future         Exercises     Shoulder Instructions       General Comments JR to 133     Pertinent Vitals/ Pain       Pain Assessment: No/denies pain  Home Living  Prior Functioning/Environment              Frequency  Min 3X/week        Progress Toward Goals  OT Goals(current goals can now be found in the care plan section)  Progress towards OT goals: Progressing toward goals     Plan Discharge plan needs to be updated    Co-evaluation                 AM-PAC PT "6 Clicks" Daily Activity     Outcome Measure   Help from another person  eating meals?: None Help from another person taking care of personal grooming?: A Little Help from another person toileting, which includes using toliet, bedpan, or urinal?: A Little Help from another person bathing (including washing, rinsing, drying)?: A Little Help from another person to put on and taking off regular upper body clothing?: A Little Help from another person to put on and taking off regular lower body clothing?: A Little 6 Click Score: 19    End of Session Equipment Utilized During Treatment: Rolling walker  OT Visit Diagnosis: Unsteadiness on feet (R26.81)   Activity Tolerance Patient tolerated treatment well   Patient Left in chair;with call bell/phone within reach;with chair alarm set   Nurse Communication Mobility status        Time: [id number] OT Time Calculation (min): 55 min  Charges: OT General Charges $OT Visit: 1 Visit OT Treatments $Self Care/Home Management : 23-37 mins $Therapeutic Activity: 8-22 mins  [facility], OTR/L [phone]    Lucille Passy M 03/20/2017, 5:06 PM

## 2017-03-20 NOTE — Social Work (Addendum)
CSW aware that pt is still awaiting care plan decisions by neuro team.   CSW continuing to follow to support pt with transfer to SNF when medically appropriate.  Pt will need insurance re-authorization/CSW to f/u with [facility] regarding placement.   Alexander Mt, [place] Work [phone]

## 2017-03-21 LAB — CREATININE, SERUM: Creatinine, Ser: 0.78 mg/dL (ref 0.44–1.00)

## 2017-03-21 LAB — GLUCOSE, CAPILLARY
Glucose-Capillary: 157 mg/dL — ABNORMAL HIGH (ref 65–99)
Glucose-Capillary: 150 mg/dL — ABNORMAL HIGH (ref 65–99)
Glucose-Capillary: 278 mg/dL — ABNORMAL HIGH (ref 65–99)
Glucose-Capillary: 250 mg/dL — ABNORMAL HIGH (ref 65–99)

## 2017-03-21 MED ORDER — GLUCERNA SHAKE PO LIQD
237.0000 mL | Freq: Two times a day (BID) | ORAL | Status: DC
  Administered 2017-03-23 – 2017-04-14 (×43): 237 mL via ORAL
  Filled 2017-03-21 (×5): qty 237

## 2017-03-21 NOTE — Progress Notes (Signed)
Inpatient Diabetes Program Recommendations  AACE/ADA: New Consensus Statement on Inpatient Glycemic Control (2015)  Target Ranges:  Prepandial:   less than 140 mg/dL      Peak postprandial:   less than 180 mg/dL (1-2 hours)      Critically ill patients:  140 - 180 mg/dL   Results for Denise Morton, Denise Morton (MRN [id number]) as of 03/21/2017 11:04  Ref. Range 03/20/2017 08:01 03/20/2017 12:16 03/20/2017 16:15 03/20/2017 21:31  Glucose-Capillary Latest Ref Range: 65 - 99 mg/dL 203 (H)  13 units Novolog 244 (H)  13 units Novolog 215 (H)  13 units Novolog 276 (H)  20 units Lantus    Results for Denise Morton, Denise Morton (MRN [id number]) as of 03/21/2017 11:04  Ref. Range 03/21/2017 07:58  Glucose-Capillary Latest Ref Range: 65 - 99 mg/dL 157 (H)    Home DM Meds: None  Current Insulin Orders: Lantus 20 units QHS      Novolog Resistant Correction Scale/ SSI (0-20 units) TID AC      Novolog 6 units TID     Note patient getting Decadron 4 mg Q8 hours.  MD- Please consider the following in-hospital insulin adjustments while patient getting Decadron:  1. Increase Lantus slightly to 22 units QHS  2. Increase Novolog Meal Coverage to: Novolog 8 units TID with meals  3. Will likely need downward adjustment of insulins once steroids are tapered down      --Will follow patient during hospitalization--  Wyn Quaker RN, MSN, CDE Diabetes Coordinator Inpatient Glycemic Control Team Team Pager: [phone] (8a-5p)  CBGs consistently elevated yesterday.

## 2017-03-21 NOTE — Progress Notes (Signed)
Physical Therapy Treatment Patient Details Name: Denise Morton MRN: [id number] DOB: 1950-05-08 Today's Date: 03/21/2017    History of Present Illness  Denise Morton is a 67 y.o. female admitted with weakness and gastritis found to have cerebellar and occipital massess s/p resection 2/25 of right cerebellar mass, pt also with mets to liver, lung and bone. PMHx of Breast CA, DM, central retinal vein occlusion right eye    PT Comments    Pt progressing with activity tolerance, gait and cognition. Pt asking about goals for returning home, aware of deficits and answering all questions appropriately today. Will continue to follow with pt reporting that she is to have additional sx tomorrow.    Follow Up Recommendations  Supervision/Assistance - 24 hour;SNF     Equipment Recommendations  Rolling walker with 5" wheels    Recommendations for Other Services       Precautions / Restrictions Precautions Precautions: Fall    Mobility  Bed Mobility Overal bed mobility: Modified Independent                Transfers Overall transfer level: Modified independent                  Ambulation/Gait Ambulation/Gait assistance: Supervision Ambulation Distance (Feet): 600 Feet Assistive device: Rolling walker (2 wheeled) Gait Pattern/deviations: Step-through pattern;Decreased stride length   Gait velocity interpretation: Below normal speed for age/gender General Gait Details: supervision for safety with pt able to navigate and control RW. Pt denied use of cane or decreased AD today but states she will do so next session   Stairs            Wheelchair Mobility    Modified Rankin (Stroke Patients Only)       Balance     Sitting balance-Leahy Scale: Good       Standing balance-Leahy Scale: Fair                              Cognition Arousal/Alertness: Awake/alert Behavior During Therapy: WFL for tasks assessed/performed                       Current Attention Level: Alternating   Following Commands: Follows multi-step commands with increased time       General Comments: pt oriented x 4, able to navigate on unit, able to spell world backward, solve simple money mangement task, greatly improved cognition      Exercises General Exercises - Lower Extremity Long Arc Quad: AROM;Seated;Both;15 reps Hip Flexion/Marching: AROM;Both;Seated;15 reps    General Comments        Pertinent Vitals/Pain Pain Score: 3  Pain Location: head Pain Descriptors / Indicators: Aching Pain Intervention(s): Limited activity within patient's tolerance;Repositioned    Home Living                      Prior Function            PT Goals (current goals can now be found in the care plan section) Progress towards PT goals: Progressing toward goals    Frequency           PT Plan Current plan remains appropriate    Co-evaluation              AM-PAC PT "6 Clicks" Daily Activity  Outcome Measure  Difficulty turning over in bed (including adjusting bedclothes, sheets and blankets)?: None Difficulty moving from lying on  back to sitting on the side of the bed? : None Difficulty sitting down on and standing up from a chair with arms (e.g., wheelchair, bedside commode, etc,.)?: None Help needed moving to and from a bed to chair (including a wheelchair)?: A Little Help needed walking in hospital room?: A Little Help needed climbing 3-5 steps with a railing? : A Little 6 Click Score: 21    End of Session   Activity Tolerance: Patient tolerated treatment well Patient left: in chair;with call bell/phone within reach;with chair alarm set Nurse Communication: Mobility status;Precautions PT Visit Diagnosis: Other symptoms and signs involving the nervous system (P82.518)     Time: 9842-1031 PT Time Calculation (min) (ACUTE ONLY): 17 min  Charges:  $Gait Training: 8-22 mins                    G Codes:       Elwyn Reach, Solomon 03/21/2017, 10:00 AM

## 2017-03-21 NOTE — Progress Notes (Signed)
Nutrition Follow-up  DOCUMENTATION CODES:   Obesity unspecified  INTERVENTION:  Discontinue Ensure.  Provide Glucerna Shake po BID, each supplement provides 220 kcal and 10 grams of protein.  Encourage adequate PO intake.   NUTRITION DIAGNOSIS:   Increased nutrient needs related to chronic illness, cancer and cancer related treatments as evidenced by estimated needs; ongoing  GOAL:   Patient will meet greater than or equal to 90% of their needs; met  MONITOR:   PO intake, Labs, Supplement acceptance, Weight trends, I & O's  REASON FOR ASSESSMENT:   Malnutrition Screening Tool    ASSESSMENT:   Pt with PMH of metastatic breast cancer, gastritis, and DM presents with weakness s/p syncope   2/25 s/p suboccipital craniotomy and resection of the right cerebellar mass   Pt unavailable during time of visit. Meal completion has been 100%. Pt currently has Ensure ordered and has been consuming them. Noted CBG's have been 150-325 mg/dL. As intake has improved and CBG's have been elevated. RD to modify orders and order Glucerna shake instead to aid in blood glucose control as well as in still providing caloric and protein needs. Will continue to monitor.   Diet Order:  Diet Carb Modified Fluid consistency: Thin; Room service appropriate? Yes Diet - low sodium heart healthy  EDUCATION NEEDS:   Education needs have been addressed  Skin:  Skin Assessment: Reviewed RN Assessment  Last BM:  3/3  Height:   Ht Readings from Last 1 Encounters:  03/07/17 _0  (1.702 m)    Weight:   Wt Readings from Last 1 Encounters:  03/21/17 232 lb 12.9 oz (105.6 kg)    Ideal Body Weight:  61.4 kg  BMI:  Body mass index is 36.46 kg/m.  Estimated Nutritional Needs:   Kcal:  2020-2263  Protein:  110-120 grams  Fluid:  >/= 2 L/d    Corrin Parker, MS, RD, LDN Pager # [phone] After hours/ weekend pager # [phone]

## 2017-03-21 NOTE — Care Management Note (Signed)
Case Management Note  Patient Details  Name: Denise Morton MRN: [id number] Date of Birth: 09-13-1950  Subjective/Objective:  Pt is a 67 y.o. female admitted with weakness and gastritis found to have cerebellar and occipital massess s/p resection 2/25 of right cerebellar mass, pt also with mets to liver, lung and bone.  PTA, pt independent lives alone.                 Action/Plan: PT/OT recommending CIR, but will need SNF due to limited assistance at home.  CSW to follow to facilitate dc to SNF upon medical stability.     Expected Discharge Date:   Expected Discharge Plan:  Skilled Nursing Facility  In-House Referral:  Clinical Social Work  Discharge planning Services  CM Consult  Post Acute Care Choice:    Choice offered to:     DME Arranged:    DME Agency:     HH Arranged:    [place] Agency:     Status of Service:  In process, will continue to follow  If discussed at Long Length of Stay Meetings, dates discussed:    Additional Comments:  03/21/17 J. Cailin Gebel, RN, BSN Pt with continued drainage from surgical site.  MD plans to take pt back to OR 3/6 for surgical revision.     Reinaldo Raddle, RN, BSN  Trauma/Neuro ICU Case Manager [phone]

## 2017-03-21 NOTE — Progress Notes (Signed)
PROGRESS NOTE    Denise Morton  VZD:[id number] DOB: 1950/04/18 DOA: 03/07/2017 PCP: Seward Carol, MD  Brief Narrative:67 y.o.femaleWith pmhx of BRCA, DM, admitted on 03/07/17 with weakness, dizziness, anorexia speech concerns and near syncope. 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 She was initially admitted to [facility], neurosurgery was consulted and patient was transferred to [facility] on 2/23 for plan for surgical removal by neurosurgery.Patient underwent suboccipital craniectomy for resection of right cerebellar meton 2/25 by Dr. Saintclair Halsted  03/16/2017-patient doing well denies any specific complaints today other than headache. She was sitting up in the bedside commode when I saw her. She had a bowel movement yesterday.  03/19/2017 patient is still having large amounts of serous drainage from the back incision. Here she reports her headache is better. She slept all night sitting up. Patient ambulated yesterday. She had good bowel movements yesterday. 03/20/2017 night nurse reported that she change the dressing twice.  Patient had a BM yesterday. 03/21/2017 patient continues to have drainage from the back of her head incisions.     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 She has staples to the back of her head. Patient has been draining serous drainage from the inferior aspect of her incision. Neurosurgery following. Continue Keppra and Decadron.?May require wound revision. Patient has been sitting up all night and sleeping.  It appears that the drainage has been decreased today  compared to yesterday.  Will wait for final decision making by neurosurgery.  Patient tells me that the neurosurgeon saw her this morning and is planning to take her back into surgery tomorrow.  Continue doxycycline.  2]type 2 diabetes blood sugar stable.increase 15 units and sliding scale insulin. Patient is on Decadron.  3]hypertension blood pressure better on Norvasc and Coreg continue as needed labetalol.  BP remains elevated.  Increase the dose of Coreg to 25 mg twice a day.  4]anemia noted drop in hemoglobin check guaiac stools. Monitor H&H start iron tablets.      DVT prophylaxis: SCD Code Status: Full code Family Communication: None  Disposition Plan TBD  Consultants: Neurosurgery  Procedures: Resection of right cerebellar mass 03/13/2017  Antimicrobials: Doxycycline Subjective: She feels better today.  Her headache is better.  Drainage is decreased.  Objective: Vitals:   03/20/17 2300 03/21/17 0300 03/21/17 0500 03/21/17 0759  BP:    (!) 189/91  Pulse:    66  Resp:    13  Temp: (!) 97.5 F (36.4 C) 97.8 F (36.6 C)  97.7 F (36.5 C)  TempSrc: Oral Oral  Oral  SpO2:    95%  Weight:   105.6 kg (232 lb 12.9 oz)   Height:        Intake/Output Summary (Last 24 hours) at 03/21/2017 1138 Last data filed at 03/21/2017 0926 Gross per 24 hour  Intake 840 ml  Output 301 ml  Net 539 ml   Filed Weights   03/18/17 0500 03/19/17 0500 03/21/17 0500  Weight: 103.3 kg (227 lb 11.8 oz) 101.7 kg (224 lb 3.3 oz) 105.6 kg (232 lb 12.9 oz)    Examination:  General exam:  Appears calm and comfortable  Respiratory system: Clear to auscultation. Respiratory effort normal. Cardiovascular system: S1 & S2 heard, RRR. No JVD, murmurs, rubs, gallops or clicks. No pedal edema. Gastrointestinal system: Abdomen is nondistended, soft and nontender. No organomegaly or masses felt. Normal bowel sounds heard. Central nervous system: Alert and oriented. No focal neurological  deficits. Extremities: Symmetric 5 x 5 power. Skin: No rashes, lesions or ulcers Psychiatry: Judgement and insight appear normal. Mood & affect appropriate.     Data Reviewed: I have personally reviewed following labs and imaging studies  CBC: Recent Labs  Lab 03/17/17 0554  WBC 10.1  NEUTROABS 8.7*  HGB 8.5*  HCT 27.3*  MCV 95.5  PLT 622   Basic Metabolic Panel: Recent Labs  Lab 03/17/17 0554 03/21/17 0427  NA 142  --   K 4.0  --   CL 107  --   CO2 24  --   GLUCOSE 271*  --   BUN 30*  --   CREATININE 0.90 0.78  CALCIUM 9.0  --    GFR: Estimated Creatinine Clearance: 86.5 mL/min (by C-G formula based on SCr of 0.78 mg/dL). Liver Function Tests: No results for input(s): AST, ALT, ALKPHOS, BILITOT, PROT, ALBUMIN in the last 168 hours. No results for input(s): LIPASE, AMYLASE in the last 168 hours. No results for input(s): AMMONIA in the last 168 hours. Coagulation Profile: No results for input(s): INR, PROTIME in the last 168 hours. Cardiac Enzymes: No results for input(s): CKTOTAL, CKMB, CKMBINDEX, TROPONINI in the last 168 hours. BNP (last 3 results) No results for input(s): PROBNP in the last 8760 hours. HbA1C: No results for input(s): HGBA1C in the last 72 hours. CBG: Recent Labs  Lab 03/20/17 0801 03/20/17 1216 03/20/17 1615 03/20/17 2131 03/21/17 0758  GLUCAP 203* 244* 215* 276* 157*   Lipid Profile: No results for input(s): CHOL, HDL, LDLCALC, TRIG, CHOLHDL, LDLDIRECT in the last 72 hours. Thyroid Function Tests: No results for input(s): TSH, T4TOTAL, FREET4, T3FREE, THYROIDAB in the last 72 hours. Anemia Panel: No results for input(s): VITAMINB12, FOLATE, FERRITIN, TIBC, IRON, RETICCTPCT in the last 72 hours. Sepsis Labs: No results for input(s): PROCALCITON, LATICACIDVEN in the last 168 hours.  Recent Results (from the past 240 hour(s))  MRSA PCR Screening     Status: None   Collection Time: 03/11/17  5:10 PM  Result Value Ref Range Status    MRSA by PCR NEGATIVE NEGATIVE Final    Comment:        The GeneXpert MRSA Assay (FDA approved for NASAL specimens only), is one component of a comprehensive MRSA colonization surveillance program. It is not intended to diagnose MRSA infection nor to guide or monitor treatment for MRSA infections. Performed at [facility], [place] [address] [place], [place] [address]          Radiology Studies: No results found.      Scheduled Meds: . amLODipine  10 mg Oral Daily  . bisacodyl  10 mg Rectal Once  . carvedilol  25 mg Oral BID WC  . chlorhexidine  15 mL Mouth Rinse BID  . Chlorhexidine Gluconate Cloth  6 each Topical Q0600  . dexamethasone  4 mg Oral Q8H  . docusate sodium  100 mg Oral BID  . doxycycline  100 mg Oral Q12H  . feeding supplement (ENSURE ENLIVE)  237 mL Oral BID BM  . ferrous sulfate  325 mg Oral BID WC  . insulin aspart  0-20 Units Subcutaneous TID WC  .  insulin aspart  6 Units Subcutaneous TID WC  . insulin glargine  20 Units Subcutaneous QHS  . levETIRAcetam  500 mg Oral BID  . mouth rinse  15 mL Mouth Rinse q12n4p  . mirtazapine  15 mg Oral QHS  . nystatin-triamcinolone   Topical BID  . pantoprazole  40 mg Oral Daily  . sodium chloride flush  10-40 mL Intracatheter Q12H   Continuous Infusions: . sodium chloride Stopped (03/18/17 1900)     LOS: 14 days     Georgette Shell, MD [facility]  If 7PM-7AM, please contact night-coverage [email or web address] Password [facility] 03/21/2017, 11:38 AM

## 2017-03-21 NOTE — Progress Notes (Signed)
error 

## 2017-03-22 ENCOUNTER — Encounter (HOSPITAL_COMMUNITY): Payer: Self-pay | Admitting: Certified Registered Nurse Anesthetist

## 2017-03-22 ENCOUNTER — Inpatient Hospital Stay (HOSPITAL_COMMUNITY)
Admission: EM | Disposition: A | Discharge status: Skilled Nursing Facility | Payer: Self-pay | Source: Home / Self Care | Attending: Neurosurgery

## 2017-03-22 ENCOUNTER — Inpatient Hospital Stay (HOSPITAL_COMMUNITY): Payer: PPO | Admitting: Certified Registered Nurse Anesthetist

## 2017-03-22 DIAGNOSIS — G9600 Cerebrospinal fluid leak, unspecified: Secondary | ICD-10-CM | POA: Diagnosis present

## 2017-03-22 DIAGNOSIS — I10 Essential (primary) hypertension: Secondary | ICD-10-CM

## 2017-03-22 HISTORY — PX: SUBOCCIPITAL CRANIECTOMY CERVICAL LAMINECTOMY: SHX5404

## 2017-03-22 LAB — GLUCOSE, CAPILLARY
GLUCOSE-CAPILLARY: 207 mg/dL — AB (ref 65–99)
GLUCOSE-CAPILLARY: 106 mg/dL — AB (ref 65–99)
GLUCOSE-CAPILLARY: 162 mg/dL — AB (ref 65–99)
Glucose-Capillary: 189 mg/dL — ABNORMAL HIGH (ref 65–99)
Glucose-Capillary: 148 mg/dL — ABNORMAL HIGH (ref 65–99)
Glucose-Capillary: 244 mg/dL — ABNORMAL HIGH (ref 65–99)

## 2017-03-22 LAB — CBC WITH DIFFERENTIAL/PLATELET
BASOS ABS: 0 10*3/uL (ref 0.0–0.1)
BASOS PCT: 0 %
EOS ABS: 0 10*3/uL (ref 0.0–0.7)
Eosinophils Relative: 0 %
HEMATOCRIT: 29.9 % — AB (ref 36.0–46.0)
Hemoglobin: 9.4 g/dL — ABNORMAL LOW (ref 12.0–15.0)
Lymphocytes Relative: 7 %
Lymphs Abs: 0.9 10*3/uL (ref 0.7–4.0)
MCH: 30.6 pg (ref 26.0–34.0)
MCHC: 31.4 g/dL (ref 30.0–36.0)
MCV: 97.4 fL (ref 78.0–100.0)
MONO ABS: 0.4 10*3/uL (ref 0.1–1.0)
Monocytes Relative: 3 %
NEUTROS ABS: 13 10*3/uL — AB (ref 1.7–7.7)
Neutrophils Relative %: 90 %
Platelets: 205 10*3/uL (ref 150–400)
RBC: 3.07 MIL/uL — ABNORMAL LOW (ref 3.87–5.11)
RDW: 17.7 % — AB (ref 11.5–15.5)
WBC: 14.3 10*3/uL — ABNORMAL HIGH (ref 4.0–10.5)

## 2017-03-22 LAB — BASIC METABOLIC PANEL
ANION GAP: 11 (ref 5–15)
BUN: 26 mg/dL — ABNORMAL HIGH (ref 6–20)
CALCIUM: 8.9 mg/dL (ref 8.9–10.3)
CO2: 27 mmol/L (ref 22–32)
Chloride: 100 mmol/L — ABNORMAL LOW (ref 101–111)
Creatinine, Ser: 0.75 mg/dL (ref 0.44–1.00)
Glucose, Bld: 225 mg/dL — ABNORMAL HIGH (ref 65–99)
Potassium: 4.7 mmol/L (ref 3.5–5.1)
SODIUM: 138 mmol/L (ref 135–145)

## 2017-03-22 LAB — TYPE AND SCREEN
ABO/RH(D): A POS
ANTIBODY SCREEN: NEGATIVE

## 2017-03-22 SURGERY — SUBOCCIPITAL CRANIECTOMY CERVICAL LAMINECTOMY/DURAPLASTY
Anesthesia: General | Site: Head

## 2017-03-22 MED ORDER — PROMETHAZINE HCL 25 MG PO TABS: 12.5000 mg | ORAL_TABLET | ORAL | Status: DC | PRN

## 2017-03-22 MED ORDER — BUPIVACAINE HCL (PF) 0.25 % IJ SOLN
INTRAMUSCULAR | Status: AC
  Filled 2017-03-22: qty 30

## 2017-03-22 MED ORDER — ONDANSETRON HCL 4 MG/2ML IJ SOLN
4.0000 mg | INTRAMUSCULAR | Status: DC | PRN
  Administered 2017-03-31 – 2017-04-02 (×3): 4 mg via INTRAVENOUS
  Filled 2017-03-22 (×3): qty 2

## 2017-03-22 MED ORDER — ONDANSETRON HCL 4 MG/2ML IJ SOLN
INTRAMUSCULAR | Status: AC
  Filled 2017-03-22: qty 2

## 2017-03-22 MED ORDER — CEFAZOLIN SODIUM-DEXTROSE 2-3 GM-%(50ML) IV SOLR
INTRAVENOUS | Status: DC | PRN
  Administered 2017-03-22: 2 g via INTRAVENOUS

## 2017-03-22 MED ORDER — BISACODYL 10 MG RE SUPP
10.0000 mg | Freq: Every day | RECTAL | Status: DC | PRN
  Administered 2017-04-02: 10 mg via RECTAL
  Filled 2017-03-22 (×2): qty 1

## 2017-03-22 MED ORDER — NITROGLYCERIN 0.2 MG/ML ON CALL CATH LAB
INTRAVENOUS | Status: DC | PRN
  Administered 2017-03-22: 40 ug via INTRAVENOUS
  Administered 2017-03-22: 80 ug via INTRAVENOUS
  Administered 2017-03-22: 40 ug via INTRAVENOUS
  Administered 2017-03-22 (×3): 80 ug via INTRAVENOUS

## 2017-03-22 MED ORDER — SODIUM CHLORIDE 0.9 % IV SOLN
INTRAVENOUS | Status: DC
  Administered 2017-03-22 – 2017-03-26 (×4): via INTRAVENOUS

## 2017-03-22 MED ORDER — LACTATED RINGERS IV SOLN
INTRAVENOUS | Status: DC | PRN
  Administered 2017-03-22: 13:00:00 via INTRAVENOUS

## 2017-03-22 MED ORDER — PHENYLEPHRINE 40 MCG/ML (10ML) SYRINGE FOR IV PUSH (FOR BLOOD PRESSURE SUPPORT)
PREFILLED_SYRINGE | INTRAVENOUS | Status: DC | PRN
  Administered 2017-03-22: 360 ug via INTRAVENOUS

## 2017-03-22 MED ORDER — OXYCODONE HCL 5 MG PO TABS: 5.0000 mg | ORAL_TABLET | Freq: Once | ORAL | Status: DC | PRN

## 2017-03-22 MED ORDER — LABETALOL HCL 5 MG/ML IV SOLN
10.0000 mg | INTRAVENOUS | Status: DC | PRN
  Administered 2017-03-23: 10 mg via INTRAVENOUS
  Administered 2017-03-26: 20 mg via INTRAVENOUS
  Administered 2017-03-28 (×2): 10 mg via INTRAVENOUS
  Administered 2017-04-02: 20 mg via INTRAVENOUS
  Filled 2017-03-22 (×4): qty 4

## 2017-03-22 MED ORDER — PANTOPRAZOLE SODIUM 40 MG IV SOLR
40.0000 mg | Freq: Every day | INTRAVENOUS | Status: DC
  Administered 2017-03-22 – 2017-03-24 (×3): 40 mg via INTRAVENOUS
  Filled 2017-03-22 (×3): qty 40

## 2017-03-22 MED ORDER — ONDANSETRON HCL 4 MG/2ML IJ SOLN: 4.0000 mg | Freq: Once | INTRAMUSCULAR | Status: DC | PRN

## 2017-03-22 MED ORDER — SUGAMMADEX SODIUM 200 MG/2ML IV SOLN
INTRAVENOUS | Status: DC | PRN
  Administered 2017-03-22: 300 mg via INTRAVENOUS

## 2017-03-22 MED ORDER — LIDOCAINE HCL (CARDIAC) 20 MG/ML IV SOLN
INTRAVENOUS | Status: DC | PRN
  Administered 2017-03-22: 40 mg via INTRAVENOUS

## 2017-03-22 MED ORDER — LABETALOL HCL 5 MG/ML IV SOLN
INTRAVENOUS | Status: DC | PRN
  Administered 2017-03-22 (×2): 10 mg via INTRAVENOUS

## 2017-03-22 MED ORDER — PHENYLEPHRINE HCL 10 MG/ML IJ SOLN
INTRAVENOUS | Status: DC | PRN
  Administered 2017-03-22: 20 ug/min via INTRAVENOUS

## 2017-03-22 MED ORDER — SODIUM CHLORIDE 0.9 % IV SOLN: INTRAVENOUS | Status: DC

## 2017-03-22 MED ORDER — 0.9 % SODIUM CHLORIDE (POUR BTL) OPTIME
TOPICAL | Status: DC | PRN
  Administered 2017-03-22 (×2): 1000 mL

## 2017-03-22 MED ORDER — FENTANYL CITRATE (PF) 250 MCG/5ML IJ SOLN
INTRAMUSCULAR | Status: AC
  Filled 2017-03-22: qty 5

## 2017-03-22 MED ORDER — LIDOCAINE-EPINEPHRINE 1 %-1:100000 IJ SOLN
INTRAMUSCULAR | Status: AC
  Filled 2017-03-22: qty 1

## 2017-03-22 MED ORDER — INSULIN GLARGINE 100 UNIT/ML ~~LOC~~ SOLN
22.0000 [IU] | Freq: Every day | SUBCUTANEOUS | Status: DC
  Administered 2017-03-22: 22 [IU] via SUBCUTANEOUS
  Filled 2017-03-22: qty 0.22

## 2017-03-22 MED ORDER — ONDANSETRON HCL 4 MG PO TABS: 4.0000 mg | ORAL_TABLET | ORAL | Status: DC | PRN

## 2017-03-22 MED ORDER — SENNA 8.6 MG PO TABS
1.0000 | ORAL_TABLET | Freq: Two times a day (BID) | ORAL | Status: DC
  Administered 2017-03-22 – 2017-04-14 (×43): 8.6 mg via ORAL
  Filled 2017-03-22 (×44): qty 1

## 2017-03-22 MED ORDER — SUCCINYLCHOLINE CHLORIDE 20 MG/ML IJ SOLN
INTRAMUSCULAR | Status: AC
  Filled 2017-03-22: qty 1

## 2017-03-22 MED ORDER — HEMOSTATIC AGENTS (NO CHARGE) OPTIME
TOPICAL | Status: DC | PRN
  Administered 2017-03-22 (×3): 1 via TOPICAL

## 2017-03-22 MED ORDER — FENTANYL CITRATE (PF) 100 MCG/2ML IJ SOLN
25.0000 ug | INTRAMUSCULAR | Status: DC | PRN
  Administered 2017-03-22: 25 ug via INTRAVENOUS
  Administered 2017-03-22: 50 ug via INTRAVENOUS
  Administered 2017-03-22: 25 ug via INTRAVENOUS

## 2017-03-22 MED ORDER — OXYCODONE HCL 5 MG/5ML PO SOLN: 5.0000 mg | Freq: Once | ORAL | Status: DC | PRN

## 2017-03-22 MED ORDER — THROMBIN 5000 UNITS EX SOLR
CUTANEOUS | Status: AC
  Filled 2017-03-22: qty 10000

## 2017-03-22 MED ORDER — FENTANYL CITRATE (PF) 100 MCG/2ML IJ SOLN
INTRAMUSCULAR | Status: AC
  Administered 2017-03-22: 25 ug via INTRAVENOUS
  Filled 2017-03-22: qty 2

## 2017-03-22 MED ORDER — NALOXONE HCL 0.4 MG/ML IJ SOLN: 0.0800 mg | INTRAMUSCULAR | Status: DC | PRN

## 2017-03-22 MED ORDER — SODIUM CHLORIDE 0.9 % IR SOLN
Status: DC | PRN
  Administered 2017-03-22: 14:00:00

## 2017-03-22 MED ORDER — HYDRALAZINE HCL 20 MG/ML IJ SOLN
5.0000 mg | INTRAMUSCULAR | Status: DC | PRN
  Administered 2017-03-22 (×4): 5 mg via INTRAVENOUS

## 2017-03-22 MED ORDER — HYDROCODONE-ACETAMINOPHEN 5-325 MG PO TABS
ORAL_TABLET | ORAL | Status: AC
  Filled 2017-03-22: qty 1

## 2017-03-22 MED ORDER — LEVETIRACETAM IN NACL 500 MG/100ML IV SOLN
500.0000 mg | Freq: Two times a day (BID) | INTRAVENOUS | Status: DC
  Administered 2017-03-22 – 2017-03-31 (×17): 500 mg via INTRAVENOUS
  Filled 2017-03-22 (×21): qty 100

## 2017-03-22 MED ORDER — DEXAMETHASONE SODIUM PHOSPHATE 10 MG/ML IJ SOLN
INTRAMUSCULAR | Status: DC | PRN
  Administered 2017-03-22: 10 mg via INTRAVENOUS

## 2017-03-22 MED ORDER — DOCUSATE SODIUM 100 MG PO CAPS
100.0000 mg | ORAL_CAPSULE | Freq: Two times a day (BID) | ORAL | Status: DC
  Administered 2017-03-22 – 2017-04-14 (×42): 100 mg via ORAL
  Filled 2017-03-22 (×44): qty 1

## 2017-03-22 MED ORDER — SUGAMMADEX SODIUM 200 MG/2ML IV SOLN
INTRAVENOUS | Status: AC
  Filled 2017-03-22: qty 4

## 2017-03-22 MED ORDER — DEXAMETHASONE SODIUM PHOSPHATE 10 MG/ML IJ SOLN
10.0000 mg | Freq: Four times a day (QID) | INTRAMUSCULAR | Status: AC
  Administered 2017-03-23 (×3): 10 mg via INTRAVENOUS
  Filled 2017-03-22 (×3): qty 1

## 2017-03-22 MED ORDER — EPHEDRINE SULFATE-NACL 50-0.9 MG/10ML-% IV SOSY
PREFILLED_SYRINGE | INTRAVENOUS | Status: DC | PRN
  Administered 2017-03-22: 25 mg via INTRAVENOUS
  Administered 2017-03-22: 5 mg via INTRAVENOUS
  Administered 2017-03-22: 10 mg via INTRAVENOUS

## 2017-03-22 MED ORDER — POLYETHYLENE GLYCOL 3350 17 G PO PACK: 17.0000 g | PACK | Freq: Every day | ORAL | Status: DC | PRN

## 2017-03-22 MED ORDER — THROMBIN (RECOMBINANT) 5000 UNITS EX SOLR
CUTANEOUS | Status: DC | PRN
  Administered 2017-03-22 (×2): 5000 [IU] via TOPICAL

## 2017-03-22 MED ORDER — ACETAMINOPHEN 325 MG PO TABS
650.0000 mg | ORAL_TABLET | ORAL | Status: DC | PRN
  Administered 2017-03-28: 650 mg via ORAL
  Filled 2017-03-22: qty 2

## 2017-03-22 MED ORDER — HYDROCODONE-ACETAMINOPHEN 5-325 MG PO TABS
1.0000 | ORAL_TABLET | ORAL | Status: DC | PRN
  Administered 2017-03-22 – 2017-04-01 (×5): 1 via ORAL
  Filled 2017-03-22 (×4): qty 1

## 2017-03-22 MED ORDER — MORPHINE SULFATE (PF) 4 MG/ML IV SOLN: 1.0000 mg | INTRAVENOUS | Status: DC | PRN

## 2017-03-22 MED ORDER — PHENYLEPHRINE 40 MCG/ML (10ML) SYRINGE FOR IV PUSH (FOR BLOOD PRESSURE SUPPORT)
PREFILLED_SYRINGE | INTRAVENOUS | Status: AC
  Filled 2017-03-22: qty 10

## 2017-03-22 MED ORDER — INSULIN ASPART 100 UNIT/ML ~~LOC~~ SOLN
8.0000 [IU] | Freq: Three times a day (TID) | SUBCUTANEOUS | Status: DC
  Administered 2017-03-23: 8 [IU] via SUBCUTANEOUS

## 2017-03-22 MED ORDER — BACITRACIN ZINC 500 UNIT/GM EX OINT
TOPICAL_OINTMENT | CUTANEOUS | Status: AC
  Filled 2017-03-22: qty 28.35

## 2017-03-22 MED ORDER — ACETAMINOPHEN 650 MG RE SUPP: 650.0000 mg | RECTAL | Status: DC | PRN

## 2017-03-22 MED ORDER — VANCOMYCIN HCL IN DEXTROSE 1-5 GM/200ML-% IV SOLN
1000.0000 mg | Freq: Two times a day (BID) | INTRAVENOUS | Status: AC
  Administered 2017-03-22 – 2017-03-23 (×2): 1000 mg via INTRAVENOUS
  Filled 2017-03-22 (×2): qty 200

## 2017-03-22 MED ORDER — GLYCOPYRROLATE 0.2 MG/ML IJ SOLN
INTRAMUSCULAR | Status: DC | PRN
  Administered 2017-03-22: 0.2 mg via INTRAVENOUS

## 2017-03-22 MED ORDER — ONDANSETRON HCL 4 MG/2ML IJ SOLN
INTRAMUSCULAR | Status: DC | PRN
  Administered 2017-03-22: 4 mg via INTRAVENOUS

## 2017-03-22 MED ORDER — HYDRALAZINE HCL 20 MG/ML IJ SOLN
INTRAMUSCULAR | Status: AC
  Administered 2017-03-22: 20 mg
  Filled 2017-03-22: qty 1

## 2017-03-22 MED ORDER — MIDAZOLAM HCL 2 MG/2ML IJ SOLN
INTRAMUSCULAR | Status: AC
  Filled 2017-03-22: qty 2

## 2017-03-22 MED ORDER — PROPOFOL 10 MG/ML IV BOLUS
INTRAVENOUS | Status: DC | PRN
  Administered 2017-03-22: 50 mg via INTRAVENOUS
  Administered 2017-03-22: 150 mg via INTRAVENOUS

## 2017-03-22 MED ORDER — FENTANYL CITRATE (PF) 100 MCG/2ML IJ SOLN
INTRAMUSCULAR | Status: DC | PRN
  Administered 2017-03-22 (×5): 50 ug via INTRAVENOUS

## 2017-03-22 MED ORDER — BACITRACIN ZINC 500 UNIT/GM EX OINT
TOPICAL_OINTMENT | CUTANEOUS | Status: DC | PRN
  Administered 2017-03-22: 1 via TOPICAL

## 2017-03-22 MED ORDER — MICROFIBRILLAR COLL HEMOSTAT EX PADS
MEDICATED_PAD | CUTANEOUS | Status: DC | PRN
  Administered 2017-03-22: 1 via TOPICAL

## 2017-03-22 MED ORDER — PROPOFOL 10 MG/ML IV BOLUS
INTRAVENOUS | Status: AC
  Filled 2017-03-22: qty 20

## 2017-03-22 MED ORDER — ROCURONIUM BROMIDE 100 MG/10ML IV SOLN
INTRAVENOUS | Status: DC | PRN
  Administered 2017-03-22: 10 mg via INTRAVENOUS
  Administered 2017-03-22: 70 mg via INTRAVENOUS
  Administered 2017-03-22: 20 mg via INTRAVENOUS

## 2017-03-22 SURGICAL SUPPLY — 84 items
BAG DECANTER FOR FLEXI CONT (MISCELLANEOUS) ×3 IMPLANT
BLADE CLIPPER SURG (BLADE) ×6 IMPLANT
BLADE SURG 11 STRL SS (BLADE) ×3 IMPLANT
BLADE ULTRA TIP 2M (BLADE) IMPLANT
BUR ACORN 9.0 PRECISION (BURR) ×2 IMPLANT
BUR ACORN 9.0MM PRECISION (BURR) ×1
CANISTER SUCT 3000ML PPV (MISCELLANEOUS) ×3 IMPLANT
CARTRIDGE OIL MAESTRO DRILL (MISCELLANEOUS) ×1 IMPLANT
DECANTER SPIKE VIAL GLASS SM (MISCELLANEOUS) ×3 IMPLANT
DERMABOND ADVANCED (GAUZE/BANDAGES/DRESSINGS) ×2
DERMABOND ADVANCED .7 DNX12 (GAUZE/BANDAGES/DRESSINGS) ×1 IMPLANT
DIFFUSER DRILL AIR PNEUMATIC (MISCELLANEOUS) ×3 IMPLANT
DRAPE LAPAROTOMY 100X72 PEDS (DRAPES) ×3 IMPLANT
DRAPE MICROSCOPE LEICA (MISCELLANEOUS) IMPLANT
DRAPE SURG 17X23 STRL (DRAPES) ×6 IMPLANT
DRAPE WARM FLUID 44X44 (DRAPE) ×3 IMPLANT
DRSG OPSITE POSTOP 4X8 (GAUZE/BANDAGES/DRESSINGS) ×3 IMPLANT
DURAMATRIX ONLAY 2X2 (Neuro Prosthesis/Implant) ×3 IMPLANT
ELECT REM PT RETURN 9FT ADLT (ELECTROSURGICAL) ×3
ELECTRODE REM PT RTRN 9FT ADLT (ELECTROSURGICAL) ×1 IMPLANT
EVACUATOR 1/8 PVC DRAIN (DRAIN) IMPLANT
EVACUATOR SILICONE 100CC (DRAIN) IMPLANT
GAUZE SPONGE 4X4 12PLY STRL (GAUZE/BANDAGES/DRESSINGS) IMPLANT
GAUZE SPONGE 4X4 16PLY XRAY LF (GAUZE/BANDAGES/DRESSINGS) IMPLANT
GLOVE BIO SURGEON STRL SZ 6.5 (GLOVE) IMPLANT
GLOVE BIO SURGEON STRL SZ7 (GLOVE) IMPLANT
GLOVE BIO SURGEON STRL SZ7.5 (GLOVE) IMPLANT
GLOVE BIO SURGEON STRL SZ8 (GLOVE) ×3 IMPLANT
GLOVE BIO SURGEON STRL SZ8.5 (GLOVE) IMPLANT
GLOVE BIO SURGEONS STRL SZ 6.5 (GLOVE)
GLOVE BIOGEL M 8.0 STRL (GLOVE) IMPLANT
GLOVE BIOGEL PI IND STRL 6.5 (GLOVE) ×2 IMPLANT
GLOVE BIOGEL PI IND STRL 7.0 (GLOVE) ×2 IMPLANT
GLOVE BIOGEL PI IND STRL 7.5 (GLOVE) ×2 IMPLANT
GLOVE BIOGEL PI INDICATOR 6.5 (GLOVE) ×4
GLOVE BIOGEL PI INDICATOR 7.0 (GLOVE) ×4
GLOVE BIOGEL PI INDICATOR 7.5 (GLOVE) ×4
GLOVE ECLIPSE 6.5 STRL STRAW (GLOVE) IMPLANT
GLOVE ECLIPSE 7.0 STRL STRAW (GLOVE) IMPLANT
GLOVE ECLIPSE 7.5 STRL STRAW (GLOVE) IMPLANT
GLOVE ECLIPSE 8.0 STRL XLNG CF (GLOVE) IMPLANT
GLOVE ECLIPSE 8.5 STRL (GLOVE) IMPLANT
GLOVE EXAM NITRILE LRG STRL (GLOVE) IMPLANT
GLOVE EXAM NITRILE XL STR (GLOVE) IMPLANT
GLOVE EXAM NITRILE XS STR PU (GLOVE) IMPLANT
GLOVE INDICATOR 6.5 STRL GRN (GLOVE) IMPLANT
GLOVE INDICATOR 7.0 STRL GRN (GLOVE) IMPLANT
GLOVE INDICATOR 7.5 STRL GRN (GLOVE) IMPLANT
GLOVE INDICATOR 8.0 STRL GRN (GLOVE) IMPLANT
GLOVE INDICATOR 8.5 STRL (GLOVE) ×3 IMPLANT
GLOVE SURG SS PI 6.5 STRL IVOR (GLOVE) ×3 IMPLANT
GOWN STRL REUS W/ TWL LRG LVL3 (GOWN DISPOSABLE) ×3 IMPLANT
GOWN STRL REUS W/ TWL XL LVL3 (GOWN DISPOSABLE) ×2 IMPLANT
GOWN STRL REUS W/TWL 2XL LVL3 (GOWN DISPOSABLE) IMPLANT
GOWN STRL REUS W/TWL LRG LVL3 (GOWN DISPOSABLE) ×6
GOWN STRL REUS W/TWL XL LVL3 (GOWN DISPOSABLE) ×4
HEMOSTAT SURGICEL 2X14 (HEMOSTASIS) ×3 IMPLANT
KIT BASIN OR (CUSTOM PROCEDURE TRAY) ×3 IMPLANT
KIT ROOM TURNOVER OR (KITS) ×3 IMPLANT
NEEDLE HYPO 22GX1.5 SAFETY (NEEDLE) ×3 IMPLANT
NS IRRIG 1000ML POUR BTL (IV SOLUTION) ×6 IMPLANT
OIL CARTRIDGE MAESTRO DRILL (MISCELLANEOUS) ×3
PACK LAMINECTOMY NEURO (CUSTOM PROCEDURE TRAY) ×3 IMPLANT
PAD ARMBOARD 7.5X6 YLW CONV (MISCELLANEOUS) ×9 IMPLANT
PATTIES SURGICAL 1/4 X 3 (GAUZE/BANDAGES/DRESSINGS) IMPLANT
RUBBERBAND STERILE (MISCELLANEOUS) IMPLANT
SEALANT ADHERUS EXTEND TIP (MISCELLANEOUS) ×3 IMPLANT
SPONGE LAP 4X18 X RAY DECT (DISPOSABLE) IMPLANT
SPONGE SURGIFOAM ABS GEL SZ50 (HEMOSTASIS) ×3 IMPLANT
STAPLER SKIN PROX WIDE 3.9 (STAPLE) ×3 IMPLANT
SUT ETHILON 2 0 FS 18 (SUTURE) IMPLANT
SUT ETHILON 3 0 FSL (SUTURE) ×6 IMPLANT
SUT NURALON 4 0 TR CR/8 (SUTURE) ×6 IMPLANT
SUT PROLENE 6 0 BV (SUTURE) IMPLANT
SUT VIC AB 1 CT1 18XBRD ANBCTR (SUTURE) ×1 IMPLANT
SUT VIC AB 1 CT1 8-18 (SUTURE) ×2
SUT VIC AB 2-0 CT1 18 (SUTURE) ×3 IMPLANT
SUT VIC AB 3-0 SH 8-18 (SUTURE) ×3 IMPLANT
SYR CONTROL 10ML LL (SYRINGE) ×3 IMPLANT
TOWEL GREEN STERILE (TOWEL DISPOSABLE) ×3 IMPLANT
TOWEL GREEN STERILE FF (TOWEL DISPOSABLE) ×3 IMPLANT
TRAY FOLEY W/METER SILVER 16FR (SET/KITS/TRAYS/PACK) ×3 IMPLANT
UNDERPAD 30X30 (UNDERPADS AND DIAPERS) ×3 IMPLANT
WATER STERILE IRR 1000ML POUR (IV SOLUTION) ×3 IMPLANT

## 2017-03-22 NOTE — Op Note (Signed)
Preoperative diagnosis: CSF leak following suboccipital craniectomy for resection of cerebellar mass  Postoperative diagnosis: Same  Procedure: Reexploration of suboccipital incision for repair of primary CSF leak utilizing DuraMatrix patch with fascial patch grafts  Surgeon: Dominica Severin Mykira Hofmeister  Asst.: Dayton Bailiff  Anesthesia: Gen.  EBL: Minimal  History of present illness: 66 year old female who underwent a subacromial craniectomy for resection of cerebellar mass last week and over the last few days as he developed clear fluid leaking out of the inferior aspect of her incision. Attempts were made to seal this off with a Dermabond on the floor unsuccessfully. So we recommended primary reexploration for primary repair. Extensively went to the over the risks and benefits of the procedure the patient as well as perioperative course expectations of outcome and alternatives surgery and she understood and agreed to proceed forward.  Operative procedure: Patient brought into the or was induced on general anesthesia positioned prone in pins back side of her head was shaved again prepped and draped in routine sterile fashion her old nylon suture was removed and the incision was opened up clear CSF medially came out under pressure and this was all aspirated dissect down the subacromial craniectomy site then utilizing a 2 x 2 dural matrix patch graft fashioned to the dural defect I sewed this around the reattaching to the native dura to the best we could no watertight fashion. There were 3 areas where the cruciate opening of the dura did not have any dural edges to capture so I sewed 3 fascial patch grafts over those edges. The initial Valsalva showed leaking of one area we sodium of the patch over this. Then after we achieved this possible closure recovered utilizing the residual dura and patch and we had I scored the fibrin glue product around the edges of the matrix overlaid Gelfoam on top of the dural matrix and  then sprayed some more of the fibrin glue. Then closed the wound in layers with interrupted Vicryl and a running nylon the skin. Wound was dressed patient recovered in stable condition. At the end the case all needle counts sponge counts were correct.

## 2017-03-22 NOTE — Progress Notes (Signed)
Pt will not be coming back to [facility] but instead will be going to [facility].

## 2017-03-22 NOTE — Transfer of Care (Signed)
Immediate Anesthesia Transfer of Care Note  Patient: Denise Morton  Procedure(s) Performed: SUBOCCIPITAL CRANIECTOMY FOR CSF LEAK REPAIR (N/A Head)  Patient Location: PACU  Anesthesia Type:General  Level of Consciousness: awake, alert  and oriented  Airway & Oxygen Therapy: Patient Spontanous Breathing and Patient connected to nasal cannula oxygen  Post-op Assessment: Report given to RN, Post -op Vital signs reviewed and stable and Patient moving all extremities X 4  Post vital signs: Reviewed and stable  Last Vitals:  Vitals:   03/22/17 0800 03/22/17 1140  BP: (!) 154/70 (!) 141/80  Pulse:  63  Resp:  16  Temp: 36.6 C 36.6 C  SpO2:  97%    Last Pain:  Vitals:   03/22/17 1140  TempSrc: Oral  PainSc:       Patients Stated Pain Goal: 1 (33/00/76 2263)  Complications: No apparent anesthesia complications

## 2017-03-22 NOTE — Progress Notes (Signed)
PROGRESS NOTE  Denise Morton  GPQ:[id number] DOB: [birth year] DOA: 03/07/2017 PCP: Seward Carol, MD  Brief Narrative:  67 y.o.femaleWith pmhx of BRCA, DM, admitted on 03/07/17 with weakness, dizziness, anorexia, speech difficulties and near syncope. MRI brain demonstrated metastatic lesions to the cerebellum and right occipital areas. She was initially admitted to [facility].  Neurosurgery was consulted and patient was transferred to [facility] on 2/23. She underwent suboccipital craniectomy for resection of right cerebellar meton 2/25 by Dr. Saintclair Halsted.  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  Symptomatically she is feeling much better with improved strength, speech, appetite.  Plan to return to the OR on 3/6 for correction of her CSF leak.  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  -  Continue Keppra and Decadron -  Wound revision today for CSF leak -  Continue doxycycline -  PT recommending skilled nursing facility  2]type 2 diabetes, blood sugar elevated -Increase Lantus to 22 units -Increase aspart with meals to 8 units 3 times daily -Continue sliding scale insulin  3]hypertension, blood pressure mildly elevated -  Continue Norvasc and Coreg -  Taper steroids per neurosurgery  4]anemia of chronic disease -   Occult stool -  Continue iron supplementation (do not see that testing was performed)  Leukocytosis, unclear etiology   DVT prophylaxis:  SCDs  Code Status: Full code Family Communication: No family at bedside, patient alone Disposition Plan: To OR today for revision of posterior scalp incision.  Anticipate discharge to skilled nursing facility in 1-2 days   Consultants:   Neurosurgery  XRT oncology  PM&R  Procedures:  03/13/2017 suboccipital  craniectomy for resection of right cerebellar met  03/22/2017:  Wound revision due to ongoing CSF leak   Antimicrobials:  Anti-infectives (From admission, onward)   Start     Dose/Rate Route Frequency Ordered Stop   03/20/17 1100  [MAR Hold]  doxycycline (VIBRA-TABS) tablet 100 mg     (MAR Hold since 03/22/17 1249)   100 mg Oral Every 12 hours 03/20/17 1011     03/20/17 0000  doxycycline (VIBRA-TABS) 100 MG tablet     100 mg Oral Every 12 hours 03/20/17 1245     03/13/17 1930  vancomycin (VANCOCIN) IVPB 1000 mg/200 mL premix     1,000 mg 200 mL/hr over 60 Minutes Intravenous Every 12 hours 03/13/17 1836 03/14/17 0756   03/13/17 0858  bacitracin 50,000 Units in sodium chloride irrigation 0.9 % 500 mL irrigation  Status:  Discontinued       As needed 03/13/17 0858 03/13/17 1604   03/13/17 0730  vancomycin (VANCOCIN) IVPB 1000 mg/200 mL premix  Status:  Discontinued     1,000 mg 200 mL/hr over 60 Minutes Intravenous To Surgery 03/13/17 0729 03/13/17 1810       Subjective:  Still has mild intermittent headache.  Denies any change to her chronic right eye blurry vision.  Overall weak but denies any focal numbness or weakness.  Feels that her speech is okay.  Denies chest pains or shortness of breath  Objective: Vitals:   03/22/17 0635 03/22/17 0800 03/22/17 1140 03/22/17 1253  BP:  (!) 154/70 (!) 141/80   Pulse:   63   Resp:   16   Temp:  97.9 F (36.6 C) 97.8 F (36.6 C)   TempSrc:  Oral  Oral   SpO2:   97%   Weight: 105.2 kg (231 lb 14.8 oz)   105.2 kg (231 lb 14.8 oz)  Height:    5' 7.01" (1.702 m)    Intake/Output Summary (Last 24 hours) at 03/22/2017 1311 Last data filed at 03/22/2017 0900 Gross per 24 hour  Intake 240 ml  Output -  Net 240 ml   Filed Weights   03/21/17 0500 03/22/17 0635 03/22/17 1253  Weight: 105.6 kg (232 lb 12.9 oz) 105.2 kg (231 lb 14.8 oz) 105.2 kg (231 lb 14.8 oz)    Examination:  General exam:  Adult female.  No acute distress.  HEENT:  MMM,  craniocaudal midline scalp incision.  The upper two thirds of her incision appear to be healing well, edges well approximated with no induration or discharge.  The lower third has serous drainage and there is some puffiness but no erythema along the suture line Respiratory system: Clear to auscultation bilaterally Cardiovascular system: Regular rate and rhythm, normal S1/S2. No murmurs, rubs, gallops or clicks.  Warm extremities.  Port accessed in the left chest Gastrointestinal system: Normal active bowel sounds, soft, nondistended, nontender. MSK:  Normal tone and bulk, no lower extremity edema Neuro:  Grossly intact    Data Reviewed: I have personally reviewed following labs and imaging studies  CBC: Recent Labs  Lab 03/17/17 0554 03/22/17 0929  WBC 10.1 14.3*  NEUTROABS 8.7* 13.0*  HGB 8.5* 9.4*  HCT 27.3* 29.9*  MCV 95.5 97.4  PLT 159 703   Basic Metabolic Panel: Recent Labs  Lab 03/17/17 0554 03/21/17 0427 03/22/17 0929  NA 142  --  138  K 4.0  --  4.7  CL 107  --  100*  CO2 24  --  27  GLUCOSE 271*  --  225*  BUN 30*  --  26*  CREATININE 0.90 0.78 0.75  CALCIUM 9.0  --  8.9   GFR: Estimated Creatinine Clearance: 86.3 mL/min (by C-G formula based on SCr of 0.75 mg/dL). Liver Function Tests: No results for input(s): AST, ALT, ALKPHOS, BILITOT, PROT, ALBUMIN in the last 168 hours. No results for input(s): LIPASE, AMYLASE in the last 168 hours. No results for input(s): AMMONIA in the last 168 hours. Coagulation Profile: No results for input(s): INR, PROTIME in the last 168 hours. Cardiac Enzymes: No results for input(s): CKTOTAL, CKMB, CKMBINDEX, TROPONINI in the last 168 hours. BNP (last 3 results) No results for input(s): PROBNP in the last 8760 hours. HbA1C: No results for input(s): HGBA1C in the last 72 hours. CBG: Recent Labs  Lab 03/21/17 1722 03/21/17 2129 03/22/17 0825 03/22/17 1122 03/22/17 1254  GLUCAP 278* 250* 207* 189* 148*   Lipid  Profile: No results for input(s): CHOL, HDL, LDLCALC, TRIG, CHOLHDL, LDLDIRECT in the last 72 hours. Thyroid Function Tests: No results for input(s): TSH, T4TOTAL, FREET4, T3FREE, THYROIDAB in the last 72 hours. Anemia Panel: No results for input(s): VITAMINB12, FOLATE, FERRITIN, TIBC, IRON, RETICCTPCT in the last 72 hours. Urine analysis: No results found for: COLORURINE, APPEARANCEUR, LABSPEC, PHURINE, GLUCOSEU, HGBUR, BILIRUBINUR, KETONESUR, PROTEINUR, UROBILINOGEN, NITRITE, LEUKOCYTESUR Sepsis Labs: @LABRCNTIP (procalcitonin:4,lacticidven:4)  )No results found for this or any previous visit (from the past 240 hour(s)).    Radiology Studies: No results found.   Scheduled Meds: . [MAR Hold] amLODipine  10 mg Oral Daily  . [MAR Hold] bisacodyl  10 mg Rectal Once  . [MAR Hold] carvedilol  25 mg Oral BID WC  . [MAR Hold] chlorhexidine  15  mL Mouth Rinse BID  . [MAR Hold] Chlorhexidine Gluconate Cloth  6 each Topical Q0600  . [MAR Hold] dexamethasone  4 mg Oral Q8H  . [MAR Hold] docusate sodium  100 mg Oral BID  . [MAR Hold] doxycycline  100 mg Oral Q12H  . [MAR Hold] feeding supplement (GLUCERNA SHAKE)  237 mL Oral BID BM  . [MAR Hold] ferrous sulfate  325 mg Oral BID WC  . [MAR Hold] insulin aspart  0-20 Units Subcutaneous TID WC  . [MAR Hold] insulin aspart  8 Units Subcutaneous TID WC  . [MAR Hold] insulin glargine  22 Units Subcutaneous QHS  . [MAR Hold] levETIRAcetam  500 mg Oral BID  . [MAR Hold] mouth rinse  15 mL Mouth Rinse q12n4p  . [MAR Hold] mirtazapine  15 mg Oral QHS  . [MAR Hold] nystatin-triamcinolone   Topical BID  . [MAR Hold] pantoprazole  40 mg Oral Daily  . [MAR Hold] sodium chloride flush  10-40 mL Intracatheter Q12H   Continuous Infusions: . sodium chloride Stopped (03/18/17 1900)     LOS: 15 days    Time spent: 30 min    Janece Canterbury, MD [facility] Pager [phone]  If 7PM-7AM, please contact  night-coverage [email or web address] Password [email or web address] 03/22/2017, 1:11 PM

## 2017-03-22 NOTE — Anesthesia Preprocedure Evaluation (Addendum)
Anesthesia Evaluation  Patient identified by MRN, date of birth, ID band Patient awake    Reviewed: Allergy & Precautions, NPO status , Patient's Chart, lab work & pertinent test results  Airway Mallampati: II  TM Distance: >3 FB Neck ROM: Full    Dental  (+) Teeth Intact, Dental Advisory Given   Pulmonary former smoker,    breath sounds clear to auscultation       Cardiovascular  Rhythm:Regular Rate:Normal     Neuro/Psych    GI/Hepatic   Endo/Other  diabetes  Renal/GU      Musculoskeletal   Abdominal   Peds  Hematology   Anesthesia Other Findings   Reproductive/Obstetrics                            Anesthesia Physical Anesthesia Plan  ASA: III  Anesthesia Plan: General   Post-op Pain Management:    Induction: Intravenous  PONV Risk Score and Plan:   Airway Management Planned: Oral ETT  Additional Equipment: Arterial line  Intra-op Plan:   Post-operative Plan: Extubation in OR  Informed Consent:   Plan Discussed with: CRNA and Anesthesiologist  Anesthesia Plan Comments:         Anesthesia Quick Evaluation

## 2017-03-22 NOTE — Anesthesia Procedure Notes (Signed)
Procedure Name: Intubation Date/Time: 03/22/2017 1:37 PM Performed by: Inda Coke, CRNA Pre-anesthesia Checklist: Patient identified, Emergency Drugs available, Suction available and Patient being monitored Patient Re-evaluated:Patient Re-evaluated prior to induction Oxygen Delivery Method: Circle System Utilized Preoxygenation: Pre-oxygenation with 100% oxygen Induction Type: IV induction Ventilation: Mask ventilation without difficulty and Oral airway inserted - appropriate to patient size Laryngoscope Size: Mac and 3 Grade View: Grade I Tube type: Oral Number of attempts: 1 Airway Equipment and Method: Stylet and Oral airway Placement Confirmation: ETT inserted through vocal cords under direct vision,  positive ETCO2 and breath sounds checked- equal and bilateral Secured at: 21 cm Tube secured with: Tape Dental Injury: Teeth and Oropharynx as per pre-operative assessment

## 2017-03-22 NOTE — Progress Notes (Signed)
Patient ID: Denise Morton, female   DOB: [birth year], 67 y.o.   MRN: [id number] Patient doing well without complaints slight headache but has persistent CSF leak of the inferior aspect of her suboccipital incision. This is been refractory to attempt a closure with Dermabond. Neurologic exam remained stable shows no signs of meningitis or neurological deficit. With persistent CSF leak refractory to conservative treatment at the bedside I recommended reexploration of her posterior cervical and suboccipital incision for repair and closure of CSF leak. I've extensively gone over the risks and benefits of the procedure with her as well as perioperative course expectations of outcome and alternatives surgery and she understands and agrees to proceed forward.

## 2017-03-22 NOTE — Progress Notes (Signed)
Pt off the unit to OR.  

## 2017-03-22 NOTE — Anesthesia Postprocedure Evaluation (Signed)
Anesthesia Post Note  Patient: Denise Morton  Procedure(s) Performed: SUBOCCIPITAL CRANIECTOMY FOR CSF LEAK REPAIR (N/A Head)     Patient location during evaluation: PACU Anesthesia Type: General Level of consciousness: awake and alert Pain management: pain level controlled Vital Signs Assessment: post-procedure vital signs reviewed and stable Respiratory status: spontaneous breathing, nonlabored ventilation, respiratory function stable and patient connected to nasal cannula oxygen Cardiovascular status: blood pressure returned to baseline and stable Postop Assessment: no apparent nausea or vomiting Anesthetic complications: no    Last Vitals:  Vitals:   03/22/17 1826 03/22/17 1830  BP:  (!) 185/114  Pulse:    Resp:  18  Temp: 36.6 C 36.5 C  SpO2:      Last Pain:  Vitals:   03/22/17 1830  TempSrc: Oral  PainSc:                  Hillary Struss COKER

## 2017-03-22 NOTE — Progress Notes (Signed)
Pharmacy Antibiotic Note  Denise Morton is a 67 y.o. female admitted on 03/07/2017 with surgical prophylaxis.  Pharmacy has been consulted for vancomycin dosing.  Second course of post-op surgical prophylaxis of vancomycin during this stay. Received Ancef as pre-op prophylaxis antibiotic today (2g given at 1414). Continues on doxycycline PO.   Plan: Vancomycin 1g IV q12h x2 doses (next due at 2030 today) Pharmacy to sign off as no dose adjustments anticipated  Height: 5\' 6"  (167.6 cm) Weight: 229 lb 4.5 oz (104 kg) IBW/kg (Calculated) : 59.3  Temp (24hrs), Avg:97.9 F (36.6 C), Min:97.7 F (36.5 C), Max:98.4 F (36.9 C)  Recent Labs  Lab 03/17/17 0554 03/21/17 0427 03/22/17 0929  WBC 10.1  --  14.3*  CREATININE 0.90 0.78 0.75    Estimated Creatinine Clearance: 84.3 mL/min (by C-G formula based on SCr of 0.75 mg/dL).    Allergies  Allergen Reactions  . Penicillin G Rash    Has patient had a PCN reaction causing immediate rash, facial/tongue/throat swelling, SOB or lightheadedness with hypotension: Unknown Has patient had a PCN reaction causing severe rash involving mucus membranes or skin necrosis: Unknown Has patient had a PCN reaction that required hospitalization: Unknown Has patient had a PCN reaction occurring within the last 10 years: Unknown If all of the above answers are "NO", then may proceed with Cephalosporin use.   . Sulfa Antibiotics Rash      Thank you for allowing pharmacy to be a part of this patient's care.  Laurence Crofford 03/22/2017 7:09 PM

## 2017-03-23 ENCOUNTER — Ambulatory Visit: Admit: 2017-03-23 | Payer: PPO

## 2017-03-23 ENCOUNTER — Ambulatory Visit
Admit: 2017-03-23 | Discharge: 2017-03-23 | Disposition: A | Discharge status: Home / Self Care | Payer: PPO | Attending: Radiation Oncology | Admitting: Radiation Oncology

## 2017-03-23 ENCOUNTER — Encounter (HOSPITAL_COMMUNITY): Payer: Self-pay | Admitting: Neurosurgery

## 2017-03-23 LAB — CBC
HCT: 30.3 % — ABNORMAL LOW (ref 36.0–46.0)
HEMOGLOBIN: 9.8 g/dL — AB (ref 12.0–15.0)
MCH: 31.8 pg (ref 26.0–34.0)
MCHC: 32.3 g/dL (ref 30.0–36.0)
MCV: 98.4 fL (ref 78.0–100.0)
PLATELETS: 188 10*3/uL (ref 150–400)
RBC: 3.08 MIL/uL — AB (ref 3.87–5.11)
RDW: 18.9 % — ABNORMAL HIGH (ref 11.5–15.5)
WBC: 13.5 10*3/uL — AB (ref 4.0–10.5)

## 2017-03-23 LAB — BASIC METABOLIC PANEL
ANION GAP: 10 (ref 5–15)
BUN: 23 mg/dL — ABNORMAL HIGH (ref 6–20)
CALCIUM: 8.7 mg/dL — AB (ref 8.9–10.3)
CO2: 24 mmol/L (ref 22–32)
Chloride: 102 mmol/L (ref 101–111)
Creatinine, Ser: 0.78 mg/dL (ref 0.44–1.00)
GFR calc Af Amer: >60 mL/min (ref >60)
Glucose, Bld: 223 mg/dL — ABNORMAL HIGH (ref 65–99)
Potassium: 4.6 mmol/L (ref 3.5–5.1)
SODIUM: 136 mmol/L (ref 135–145)

## 2017-03-23 LAB — GLUCOSE, CAPILLARY
GLUCOSE-CAPILLARY: 221 mg/dL — AB (ref 65–99)
GLUCOSE-CAPILLARY: 205 mg/dL — AB (ref 65–99)
GLUCOSE-CAPILLARY: 188 mg/dL — AB (ref 65–99)
GLUCOSE-CAPILLARY: 138 mg/dL — AB (ref 65–99)
Glucose-Capillary: 246 mg/dL — ABNORMAL HIGH (ref 65–99)

## 2017-03-23 MED ORDER — INSULIN ASPART 100 UNIT/ML ~~LOC~~ SOLN
0.0000 [IU] | Freq: Every day | SUBCUTANEOUS | Status: DC
  Administered 2017-03-25 – 2017-03-27 (×3): 2 [IU] via SUBCUTANEOUS
  Administered 2017-03-31: 0 [IU] via SUBCUTANEOUS
  Administered 2017-04-04 – 2017-04-09 (×2): 2 [IU] via SUBCUTANEOUS

## 2017-03-23 MED ORDER — INSULIN GLARGINE 100 UNIT/ML ~~LOC~~ SOLN
25.0000 [IU] | Freq: Every day | SUBCUTANEOUS | Status: DC
  Administered 2017-03-23: 25 [IU] via SUBCUTANEOUS
  Filled 2017-03-23: qty 0.25

## 2017-03-23 MED ORDER — DOXYCYCLINE HYCLATE 100 MG PO TABS
100.0000 mg | ORAL_TABLET | Freq: Two times a day (BID) | ORAL | Status: DC
  Administered 2017-03-23 – 2017-04-14 (×45): 100 mg via ORAL
  Filled 2017-03-23 (×47): qty 1

## 2017-03-23 MED ORDER — INSULIN ASPART 100 UNIT/ML ~~LOC~~ SOLN
11.0000 [IU] | Freq: Three times a day (TID) | SUBCUTANEOUS | Status: DC
  Administered 2017-03-23 – 2017-03-24 (×3): 11 [IU] via SUBCUTANEOUS

## 2017-03-23 MED FILL — Thrombin For Soln 5000 Unit: CUTANEOUS | Qty: 2 | Status: AC

## 2017-03-23 NOTE — Progress Notes (Signed)
PT Cancellation Note  Patient Details Name: Denise Morton MRN: [id number] DOB: [birth year]   Cancelled Treatment:    Reason Eval/Treat Not Completed: Active bedrest order   Norvil Martensen B Tyson Parkison 03/23/2017, 7:09 AM  Elwyn Reach, [place]

## 2017-03-23 NOTE — Progress Notes (Signed)
Subjective: Patient reports Patient doing very well minimal headache  Objective: Vital signs in last 24 hours: Temp:  [97.6 F (36.4 C)-98.4 F (36.9 C)] 97.6 F (36.4 C) (03/07 1200) Pulse Rate:  [49-130] 84 (03/07 1500) Resp:  [9-20] 19 (03/07 1300) BP: (116-185)/(61-114) 138/88 (03/07 1500) SpO2:  [89 %-99 %] 95 % (03/07 1500) Arterial Line BP: (139-184)/(52-99) 182/73 (03/07 0800) Weight:  [104 kg (229 lb 4.5 oz)] 104 kg (229 lb 4.5 oz) (03/07 0500)  Intake/Output from previous day: 03/06 0701 - 03/07 0700 In: 1408.8 [I.V.:1408.8] Out: 1700 [Urine:1650; Blood:50] Intake/Output this shift: Total I/O In: 508.6 [P.O.:100; I.V.:208.6; IV Piggyback:200] Out: -   Awake alert slightly dysarthric but improved from preop incision clean dry and intact  Lab Results: Recent Labs    03/22/17 0929 03/23/17 0606  WBC 14.3* 13.5*  HGB 9.4* 9.8*  HCT 29.9* 30.3*  PLT 205 188   BMET Recent Labs    03/22/17 0929 03/23/17 0606  NA 138 136  K 4.7 4.6  CL 100* 102  CO2 27 24  GLUCOSE 225* 223*  BUN 26* 23*  CREATININE 0.75 0.78  CALCIUM 8.9 8.7*    Studies/Results: No results found.  Assessment/Plan:   Mobilize today with physical and occupational therapy continue to observe in the ICU  LOS: 16 days     Nysa Sarin P 03/23/2017, 3:57 PM

## 2017-03-23 NOTE — Progress Notes (Signed)
Physical Therapy Treatment Patient Details Name: Denise Morton MRN: [id number] DOB: 12/25/1950 Today's Date: 03/23/2017    History of Present Illness  Denise Morton is a 68 y.o. female admitted with weakness and gastritis found to have cerebellar and occipital massess s/p resection 2/25 of right cerebellar mass, pt also with mets to liver, lung and bone, 3/6 with repeat sx for CSF leak repair.  PMHx of Breast CA, DM, central retinal vein occlusion right eye    PT Comments    Pt very pleasant, moving well and continues to demonstrate increased problem solving. Pt easily answering questions for things that fly and live in the sea although one day off with orientation. Pt with difficulty problem solving how to don sock today and was able to perform but required increased time. Pt with decreased activity tolerance with education for self-regulation and decreased speed. Pt encouraged to continue mobility with nursing and HEP. Will continue to follow.    Follow Up Recommendations  Supervision/Assistance - 24 hour;SNF     Equipment Recommendations  Rolling walker with 5" wheels    Recommendations for Other Services       Precautions / Restrictions Precautions Precautions: Fall Restrictions Weight Bearing Restrictions: No    Mobility  Bed Mobility Overal bed mobility: Needs Assistance Bed Mobility: Supine to Sit     Supine to sit: Supervision     General bed mobility comments: supervision for lines  Transfers Overall transfer level: Needs assistance   Transfers: Sit to/from Stand Sit to Stand: Supervision         General transfer comment: supervision for safety and lines  Ambulation/Gait Ambulation/Gait assistance: Supervision Ambulation Distance (Feet): 450 Feet Assistive device: Rolling walker (2 wheeled) Gait Pattern/deviations: Step-through pattern;Decreased stride length;Trunk flexed Gait velocity: good speed but somewhat too quick for her tolerance   General  Gait Details: cues for posture and activity regulation as pt getting SOB and not wanting to slow down. SpO2 88-94% on RA with HR 115-130 with gait, reliant on RW today   Stairs            Wheelchair Mobility    Modified Rankin (Stroke Patients Only)       Balance Overall balance assessment: Needs assistance   Sitting balance-Leahy Scale: Good       Standing balance-Leahy Scale: Fair                              Cognition Arousal/Alertness: Awake/alert Behavior During Therapy: WFL for tasks assessed/performed Overall Cognitive Status: Impaired/Different from baseline Area of Impairment: Safety/judgement                 Orientation Level: Time Current Attention Level: Alternating Memory: Decreased short-term memory Following Commands: Follows multi-step commands with increased time Safety/Judgement: Decreased awareness of deficits     General Comments: pt disoriented to time, decreased STM today trying to stand before belt is on with instructions prior to mobility for belt first. pt navigating unit well, decreased awareness of activity tolerance      Exercises General Exercises - Lower Extremity Long Arc Quad: AROM;Seated;Both;15 reps Hip Flexion/Marching: AROM;Both;Seated;10 reps    General Comments        Pertinent Vitals/Pain Pain Assessment: No/denies pain    Home Living                      Prior Function  PT Goals (current goals can now be found in the care plan section) Progress towards PT goals: Progressing toward goals    Frequency           PT Plan Current plan remains appropriate    Co-evaluation              AM-PAC PT "6 Clicks" Daily Activity  Outcome Measure  Difficulty turning over in bed (including adjusting bedclothes, sheets and blankets)?: None Difficulty moving from lying on back to sitting on the side of the bed? : None Difficulty sitting down on and standing up from a chair  with arms (e.g., wheelchair, bedside commode, etc,.)?: A Little Help needed moving to and from a bed to chair (including a wheelchair)?: A Little Help needed walking in hospital room?: A Little Help needed climbing 3-5 steps with a railing? : A Little 6 Click Score: 20    End of Session Equipment Utilized During Treatment: Gait belt Activity Tolerance: Patient tolerated treatment well Patient left: in chair;with call bell/phone within reach;with chair alarm set Nurse Communication: Mobility status;Precautions PT Visit Diagnosis: Other symptoms and signs involving the nervous system (R29.898)     Time: 6979-4801 PT Time Calculation (min) (ACUTE ONLY): 23 min  Charges:  $Gait Training: 8-22 mins $Therapeutic Exercise: 8-22 mins                    G Codes:       Elwyn Reach, PT [phone]    [place] 03/23/2017, 10:51 AM

## 2017-03-23 NOTE — Progress Notes (Signed)
PROGRESS NOTE  Denise Morton  JTT:[id number] DOB: May 12, 1950 DOA: 03/07/2017 PCP: Seward Carol, MD  Brief Narrative:  67 y.o.femaleWith pmhx of BRCA, DM, admitted on 03/07/17 with weakness, dizziness, anorexia, speech difficulties and near syncope. MRI brain demonstrated metastatic lesions to the cerebellum and right occipital areas. She was initially admitted to [facility].  Neurosurgery was consulted and patient was transferred to [facility] on 2/23. She underwent suboccipital craniectomy for resection of right cerebellar meton 2/25 by Dr. Saintclair Halsted.  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  Symptomatically she is feeling much better with improved strength, speech, appetite.  Returned to the OR on 3/6 for correction of her CSF leak underwent reexploration of the suboccipital incision for repair of primary CSF leak utilizing DuraMatrix patch with fascial patch grafts.  She was restarted on high-dose dexamethasone postoperatively and admitted to the ICU.  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  -  Continue Keppra  -  Decadron increased to 31m IV q6h post-op -  Continue doxycycline -  PT recommending skilled nursing facility  2]type 2 diabetes, blood sugar elevated secondary to steroids -Increase Lantus to 25 units -Increase aspart with meals to 11 units 3 times daily -Continue high dose sliding scale insulin - change back to diabetic diet (currently on regular)  3]hypertension, blood pressure mildly elevated -  Continue Norvasc and Coreg -  Taper steroids per neurosurgery  4]anemia of chronic disease -   Occult stool -  Continue iron supplementation (do not see that testing was performed)  Leukocytosis, unclear etiology   DVT prophylaxis:  SCDs  Code Status: Full  code Family Communication: No family at bedside, patient alone Disposition Plan: Anticipate discharge to skilled nursing facility in a few days   Consultants:   Neurosurgery  XRT oncology  PM&R  Procedures:  03/13/2017 suboccipital craniectomy for resection of right cerebellar met  03/22/2017:  Wound revision due to CSF leak   Antimicrobials:  Anti-infectives (From admission, onward)   Start     Dose/Rate Route Frequency Ordered Stop   03/23/17 1200  doxycycline (VIBRA-TABS) tablet 100 mg     100 mg Oral Every 12 hours 03/23/17 0954     03/22/17 2030  vancomycin (VANCOCIN) IVPB 1000 mg/200 mL premix     1,000 mg 200 mL/hr over 60 Minutes Intravenous Every 12 hours 03/22/17 1912 03/23/17 0939   03/22/17 1428  bacitracin 50,000 Units in sodium chloride irrigation 0.9 % 500 mL irrigation  Status:  Discontinued       As needed 03/22/17 1428 03/22/17 1550   03/20/17 1100  doxycycline (VIBRA-TABS) tablet 100 mg  Status:  Discontinued     100 mg Oral Every 12 hours 03/20/17 1011 03/23/17 0954   03/20/17 0000  doxycycline (VIBRA-TABS) 100 MG tablet     100 mg Oral Every 12 hours 03/20/17 1245     03/13/17 1930  vancomycin (VANCOCIN) IVPB 1000 mg/200 mL premix     1,000 mg 200 mL/hr over 60 Minutes Intravenous Every 12 hours 03/13/17 1836 03/14/17 0756   03/13/17 0858  bacitracin 50,000 Units in sodium chloride irrigation 0.9 % 500 mL irrigation  Status:  Discontinued       As needed 03/13/17 0858 03/13/17 1604   03/13/17 0730  vancomycin (VANCOCIN) IVPB 1000 mg/200 mL premix  Status:  Discontinued  1,000 mg 200 mL/hr over 60 Minutes Intravenous To Surgery 03/13/17 0729 03/13/17 1810       Subjective:  States that her headache is very mild 1 out of 10.  Her vision is his usual level of blurriness.  She denies any focal numbness, weakness but was generally weak when she tried to get up to work with physical therapy.   Objective: Vitals:   03/23/17 1100 03/23/17 1200  03/23/17 1300 03/23/17 1500  BP: 140/75 139/69 (!) 149/104 138/88  Pulse: 75 72 86 84  Resp: 20 19 19    Temp:  97.6 F (36.4 C)    TempSrc:  Oral    SpO2: 91% 93% 97% 95%  Weight:      Height:        Intake/Output Summary (Last 24 hours) at 03/23/2017 1604 Last data filed at 03/23/2017 1200 Gross per 24 hour  Intake 1917.33 ml  Output 1250 ml  Net 667.33 ml   Filed Weights   03/22/17 1253 03/22/17 1830 03/23/17 0500  Weight: 105.2 kg (231 lb 14.8 oz) 104 kg (229 lb 4.5 oz) 104 kg (229 lb 4.5 oz)    Examination:  General exam:  Adult female.  No acute distress.  HEENT:   MMM, midline craniocaudal scalp incision has honeycomb dressing with scant amount of serosanguineous material underneath but is not obviously leaking CSF. Respiratory system: Clear to auscultation bilaterally Cardiovascular system: Regular rate and rhythm, normal S1/S2. No murmurs, rubs, gallops or clicks.  Warm extremities.  Port accessed in the left chest Gastrointestinal system: Normal active bowel sounds, soft, nondistended, nontender. MSK:  Normal tone and bulk, no lower extremity edema Neuro:  Grossly intact, minimally slurred speech.  SITLT hands and feet.  Grossly moves all extremities   Data Reviewed: I have personally reviewed following labs and imaging studies  CBC: Recent Labs  Lab 03/17/17 0554 03/22/17 0929 03/23/17 0606  WBC 10.1 14.3* 13.5*  NEUTROABS 8.7* 13.0*  --   HGB 8.5* 9.4* 9.8*  HCT 27.3* 29.9* 30.3*  MCV 95.5 97.4 98.4  PLT 159 205 027   Basic Metabolic Panel: Recent Labs  Lab 03/17/17 0554 03/21/17 0427 03/22/17 0929 03/23/17 0606  NA 142  --  138 136  K 4.0  --  4.7 4.6  CL 107  --  100* 102  CO2 24  --  27 24  GLUCOSE 271*  --  225* 223*  BUN 30*  --  26* 23*  CREATININE 0.90 0.78 0.75 0.78  CALCIUM 9.0  --  8.9 8.7*   GFR: Estimated Creatinine Clearance: 84.3 mL/min (by C-G formula based on SCr of 0.78 mg/dL). Liver Function Tests: No results for input(s):  AST, ALT, ALKPHOS, BILITOT, PROT, ALBUMIN in the last 168 hours. No results for input(s): LIPASE, AMYLASE in the last 168 hours. No results for input(s): AMMONIA in the last 168 hours. Coagulation Profile: No results for input(s): INR, PROTIME in the last 168 hours. Cardiac Enzymes: No results for input(s): CKTOTAL, CKMB, CKMBINDEX, TROPONINI in the last 168 hours. BNP (last 3 results) No results for input(s): PROBNP in the last 8760 hours. HbA1C: No results for input(s): HGBA1C in the last 72 hours. CBG: Recent Labs  Lab 03/22/17 1923 03/22/17 2324 03/23/17 0332 03/23/17 0744 03/23/17 1144  GLUCAP 162* 244* 221* 205* 246*   Lipid Profile: No results for input(s): CHOL, HDL, LDLCALC, TRIG, CHOLHDL, LDLDIRECT in the last 72 hours. Thyroid Function Tests: No results for input(s): TSH, T4TOTAL, FREET4, T3FREE, THYROIDAB  in the last 72 hours. Anemia Panel: No results for input(s): VITAMINB12, FOLATE, FERRITIN, TIBC, IRON, RETICCTPCT in the last 72 hours. Urine analysis: No results found for: COLORURINE, APPEARANCEUR, LABSPEC, PHURINE, GLUCOSEU, HGBUR, BILIRUBINUR, KETONESUR, PROTEINUR, UROBILINOGEN, NITRITE, LEUKOCYTESUR Sepsis Labs: @LABRCNTIP (procalcitonin:4,lacticidven:4)  )No results found for this or any previous visit (from the past 240 hour(s)).    Radiology Studies: No results found.   Scheduled Meds: . amLODipine  10 mg Oral Daily  . bisacodyl  10 mg Rectal Once  . carvedilol  25 mg Oral BID WC  . chlorhexidine  15 mL Mouth Rinse BID  . Chlorhexidine Gluconate Cloth  6 each Topical Q0600  . dexamethasone  10 mg Intravenous Q6H  . docusate sodium  100 mg Oral BID  . doxycycline  100 mg Oral Q12H  . feeding supplement (GLUCERNA SHAKE)  237 mL Oral BID BM  . ferrous sulfate  325 mg Oral BID WC  . insulin aspart  0-20 Units Subcutaneous TID WC  . insulin aspart  11 Units Subcutaneous TID WC  . insulin glargine  25 Units Subcutaneous QHS  . mirtazapine  15 mg Oral  QHS  . nystatin-triamcinolone   Topical BID  . pantoprazole (PROTONIX) IV  40 mg Intravenous QHS  . senna  1 tablet Oral BID  . sodium chloride flush  10-40 mL Intracatheter Q12H   Continuous Infusions: . sodium chloride Stopped (03/23/17 1500)  . sodium chloride 10 mL/hr at 03/23/17 1500  . sodium chloride    . levETIRAcetam Stopped (03/23/17 0650)     LOS: 16 days    Time spent: 30 min    Janece Canterbury, MD [facility] Pager [phone]  If 7PM-7AM, please contact night-coverage [email or web address] Password [facility] 03/23/2017, 4:04 PM

## 2017-03-23 NOTE — Social Work (Addendum)
CSW received a call from [place] at [facility], [phone], indicating that Insurance Auth for SNF placement is good until 03/24/17. CSW discussed patient recent procedure on 3/6. CSW will need to f/u and re-initiate auth for placement for SNF per HTA.  CSW agreed to same and will continue to follow for SNF placement.  Elissa Hefty, LCSW Clinical Social Worker [phone]

## 2017-03-24 LAB — CBC
HCT: 28.7 % — ABNORMAL LOW (ref 36.0–46.0)
HEMOGLOBIN: 9.2 g/dL — AB (ref 12.0–15.0)
MCH: 32.2 pg (ref 26.0–34.0)
MCHC: 32.1 g/dL (ref 30.0–36.0)
MCV: 100.3 fL — ABNORMAL HIGH (ref 78.0–100.0)
Platelets: 161 10*3/uL (ref 150–400)
RBC: 2.86 MIL/uL — ABNORMAL LOW (ref 3.87–5.11)
RDW: 18.9 % — AB (ref 11.5–15.5)
WBC: 9.3 10*3/uL (ref 4.0–10.5)

## 2017-03-24 LAB — BASIC METABOLIC PANEL
ANION GAP: 8 (ref 5–15)
BUN: 32 mg/dL — AB (ref 6–20)
CALCIUM: 8.6 mg/dL — AB (ref 8.9–10.3)
CO2: 25 mmol/L (ref 22–32)
Chloride: 104 mmol/L (ref 101–111)
Creatinine, Ser: 0.86 mg/dL (ref 0.44–1.00)
GFR calc Af Amer: >60 mL/min (ref >60)
GFR calc non Af Amer: >60 mL/min (ref >60)
GLUCOSE: 144 mg/dL — AB (ref 65–99)
Potassium: 4.2 mmol/L (ref 3.5–5.1)
SODIUM: 137 mmol/L (ref 135–145)

## 2017-03-24 LAB — GLUCOSE, CAPILLARY
GLUCOSE-CAPILLARY: 82 mg/dL (ref 65–99)
GLUCOSE-CAPILLARY: 197 mg/dL — AB (ref 65–99)
Glucose-Capillary: 123 mg/dL — ABNORMAL HIGH (ref 65–99)

## 2017-03-24 MED ORDER — INSULIN GLARGINE 100 UNIT/ML ~~LOC~~ SOLN
20.0000 [IU] | Freq: Every day | SUBCUTANEOUS | Status: DC
  Administered 2017-03-24 – 2017-03-25 (×2): 20 [IU] via SUBCUTANEOUS
  Filled 2017-03-24 (×2): qty 0.2

## 2017-03-24 MED ORDER — INSULIN ASPART 100 UNIT/ML ~~LOC~~ SOLN
7.0000 [IU] | Freq: Three times a day (TID) | SUBCUTANEOUS | Status: DC
  Administered 2017-03-24 – 2017-03-25 (×5): 7 [IU] via SUBCUTANEOUS

## 2017-03-24 MED ORDER — DEXAMETHASONE SODIUM PHOSPHATE 10 MG/ML IJ SOLN
6.0000 mg | Freq: Four times a day (QID) | INTRAMUSCULAR | Status: DC
  Administered 2017-03-24 – 2017-03-27 (×13): 6 mg via INTRAVENOUS
  Filled 2017-03-24 (×14): qty 1

## 2017-03-24 NOTE — Social Work (Addendum)
CSW continuing to follow. [employer] case closed, CSW to call when aware pt will discharge in 24-48 hrs.   3:42pm- CSW has restarted authorization for pt through [place].   [place], [place] Work [phone]

## 2017-03-24 NOTE — Progress Notes (Signed)
Occupational Therapy Treatment Patient Details Name: Denise Morton MRN: [id number] DOB: Dec 09, 1950 Today's Date: 03/24/2017    History of present illness  Denise Morton is a 67 y.o. female admitted with weakness and gastritis found to have cerebellar and occipital massess s/p resection 2/25 of right cerebellar mass, pt also with mets to liver, lung and bone, 3/6 with repeat sx for CSF leak repair.  PMHx of Breast CA, DM, central retinal vein occlusion right eye   OT comments  Pt sitting EOB on entry to room. Apparently pt had ambulated to bathroom with nsg and noted bandage on head was saturated. Nsg aware. Session limited to bed level at this time. Pt given theraband ex to work on @ bedlevel over the weekend. Will follow up next week.   Follow Up Recommendations  SNF;Supervision/Assistance - 24 hour    Equipment Recommendations  3 in 1 bedside commode;Tub/shower seat    Recommendations for Other Services      Precautions / Restrictions Precautions Precautions: Fall       Mobility Bed Mobility Overal bed mobility: Needs Assistance Bed Mobility: Sit to Supine     Supine to sit: Supervision;HOB elevated Sit to supine: Min assist(A to lift legs back up onto bed)   General bed mobility comments: supervision for lines, HOB 30   Transfers Overall transfer level: Needs assistance   Transfers: Sit to/from Stand Sit to Stand: Supervision;Mod assist         General transfer comment: deferred due to ? new csf leak    Balance Overall balance assessment: Needs assistance   Sitting balance-Leahy Scale: Good       Standing balance-Leahy Scale: Fair                             ADL either performed or assessed with clinical judgement   ADL                                               Vision   Additional Comments: Pt does not appears to demonstrate visual deficits but will further assess; question L peripheral field deficit   Perception      Praxis      Cognition Arousal/Alertness: Awake/alert Behavior During Therapy: WFL for tasks assessed/performed Overall Cognitive Status: Impaired/Different from baseline Area of Impairment: Safety/judgement;Problem solving                       Following Commands: Follows multi-step commands with increased time Safety/Judgement: Decreased awareness of deficits     General Comments: Will further assess; Pt appears to be making progress cognitively. Will further assess; appears to have workd finding problems at times        Exercises  Other Exercises Other Exercises: BUE general theraband strengthening ex @ bed level.   Shoulder Instructions       General Comments      Pertinent Vitals/ Pain       Pain Assessment: Faces Faces Pain Scale: Hurts a little bit Pain Location: head Pain Descriptors / Indicators: Aching Pain Intervention(s): Limited activity within patient's tolerance  Home Living  Prior Functioning/Environment              Frequency  Min 3X/week        Progress Toward Goals  OT Goals(current goals can now be found in the care plan section)  Progress towards OT goals: Progressing toward goals  Acute Rehab OT Goals Patient Stated Goal: return home OT Goal Formulation: With patient Time For Goal Achievement: 03/28/17 Potential to Achieve Goals: Good ADL Goals Pt Will Perform Grooming: with supervision;standing Pt Will Perform Upper Body Bathing: with set-up;sitting Pt Will Perform Lower Body Bathing: with supervision;sit to/from stand Pt Will Perform Upper Body Dressing: with supervision;sitting Pt Will Perform Lower Body Dressing: with supervision;sit to/from stand Pt Will Transfer to Toilet: with supervision;ambulating;regular height toilet;bedside commode Pt Will Perform Toileting - Clothing Manipulation and hygiene: with supervision;sit to/from stand Additional ADL  Goal #1: Pt will be oriented x 4  Plan Discharge plan remains appropriate    Co-evaluation                 AM-PAC PT "6 Clicks" Daily Activity     Outcome Measure   Help from another person eating meals?: None Help from another person taking care of personal grooming?: A Little Help from another person toileting, which includes using toliet, bedpan, or urinal?: A Little Help from another person bathing (including washing, rinsing, drying)?: A Little Help from another person to put on and taking off regular upper body clothing?: A Little Help from another person to put on and taking off regular lower body clothing?: A Little 6 Click Score: 19    End of Session        Activity Tolerance Treatment limited secondary to medical complications (Comment)   Patient Left in bed;with call bell/phone within reach;with bed alarm set   Nurse Communication Mobility status        Time: 4098-1191 OT Time Calculation (min): 21 min  Charges: OT General Charges $OT Visit: 1 Visit OT Treatments $Therapeutic Activity: 8-22 mins  [facility], OT/L  [phone] 03/24/2017   Denise Morton,HILLARY 03/24/2017, 3:42 PM

## 2017-03-24 NOTE — Progress Notes (Signed)
PROGRESS NOTE  Denise Morton  WUG:[id number] DOB: 1950-05-04 DOA: 03/07/2017 PCP: Seward Carol, MD  Brief Narrative:  67 y.o.femaleWith pmhx of BRCA, DM, admitted on 03/07/17 with weakness, dizziness, anorexia, speech difficulties and near syncope. MRI brain demonstrated metastatic lesions to the cerebellum and right occipital areas. She was initially admitted to [facility].  Neurosurgery was consulted and patient was transferred to [facility] on 2/23. She underwent suboccipital craniectomy for resection of right cerebellar meton 2/25 by Dr. Saintclair Halsted.  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  Symptomatically she is feeling much better with improved strength, speech, appetite.  Returned to the OR on 3/6 for correction of her CSF leak underwent reexploration of the suboccipital incision for repair of primary CSF leak utilizing DuraMatrix patch with fascial patch grafts.  She was restarted on high-dose dexamethasone postoperatively and admitted to the ICU.  CBGs trending down but steroids also being tapered.  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  -  Continue Keppra  -  Decadron decreased today -  Continue doxycycline per surgery -  PT recommending skilled nursing facility  2]type 2 diabetes, blood sugar trending down but steroids tapered today -decrease Lantus to 20 units -Increase aspart with meals to 7 units 3 times daily -Continue high dose sliding scale insulin  3]hypertension, blood pressure trending down -  Continue Norvasc and Coreg -  Taper steroids per neurosurgery  4]anemia of chronic disease -   Occult stool -  Continue iron supplementation (do not see that testing was performed)  Leukocytosis, unclear etiology   DVT prophylaxis:  SCDs  Code Status: Full  code Family Communication: No family at bedside, patient alone Disposition Plan: Anticipate discharge to skilled nursing facility in a few days   Consultants:   Neurosurgery  XRT oncology  PM&R  Procedures:  03/13/2017 suboccipital craniectomy for resection of right cerebellar met  03/22/2017:  Wound revision due to CSF leak   Antimicrobials:  Anti-infectives (From admission, onward)   Start     Dose/Rate Route Frequency Ordered Stop   03/23/17 1200  doxycycline (VIBRA-TABS) tablet 100 mg     100 mg Oral Every 12 hours 03/23/17 0954     03/22/17 2030  vancomycin (VANCOCIN) IVPB 1000 mg/200 mL premix     1,000 mg 200 mL/hr over 60 Minutes Intravenous Every 12 hours 03/22/17 1912 03/23/17 0939   03/22/17 1428  bacitracin 50,000 Units in sodium chloride irrigation 0.9 % 500 mL irrigation  Status:  Discontinued       As needed 03/22/17 1428 03/22/17 1550   03/20/17 1100  doxycycline (VIBRA-TABS) tablet 100 mg  Status:  Discontinued     100 mg Oral Every 12 hours 03/20/17 1011 03/23/17 0954   03/20/17 0000  doxycycline (VIBRA-TABS) 100 MG tablet     100 mg Oral Every 12 hours 03/20/17 1245     03/13/17 1930  vancomycin (VANCOCIN) IVPB 1000 mg/200 mL premix     1,000 mg 200 mL/hr over 60 Minutes Intravenous Every 12 hours 03/13/17 1836 03/14/17 0756   03/13/17 0858  bacitracin 50,000 Units in sodium chloride irrigation 0.9 % 500 mL irrigation  Status:  Discontinued       As needed 03/13/17 0858 03/13/17 1604   03/13/17 0730  vancomycin (VANCOCIN) IVPB 1000 mg/200 mL premix  Status:  Discontinued  1,000 mg 200 mL/hr over 60 Minutes Intravenous To Surgery 03/13/17 6237 03/13/17 1810       Subjective:  Patient states that she feels impatient to get out of the hospital but otherwise she feels well.  She only has minimal headache/scalp discomfort.  Vision is stable, no new focal weakness that she is noticed.  She is just generally weak and is anxious to start rehabilitation.  She  is pleased her blood sugars have come down over the last 24 hours   Objective: Vitals:   03/24/17 0805 03/24/17 0900 03/24/17 0950 03/24/17 1204  BP:  114/77 97/64 (!) 113/53  Pulse: 72 88 73 70  Resp:  17 (!) 23 11  Temp:   (!) 97.5 F (36.4 C) (!) 97.5 F (36.4 C)  TempSrc:   Oral Oral  SpO2:  96% 96% 95%  Weight:      Height:        Intake/Output Summary (Last 24 hours) at 03/24/2017 1536 Last data filed at 03/24/2017 0800 Gross per 24 hour  Intake 400 ml  Output -  Net 400 ml   Filed Weights   03/22/17 1830 03/23/17 0500 03/24/17 0430  Weight: 104 kg (229 lb 4.5 oz) 104 kg (229 lb 4.5 oz) 104 kg (229 lb 4.5 oz)    Examination:  General exam:  Adult female.  No acute distress.  HEENT: Some serosanguineous drainage from the inferior portion of her midline craniocaudal scalp incision on her posterior scalp, MMM Respiratory system: Clear to auscultation bilaterally Cardiovascular system: Regular rate and rhythm, normal S1/S2. No murmurs, rubs, gallops or clicks.  Warm extremities Gastrointestinal system: Normal active bowel sounds, soft, nondistended, nontender. MSK:  Normal tone and bulk, no lower extremity edema Neuro:  Grossly intact.    Data Reviewed: I have personally reviewed following labs and imaging studies  CBC: Recent Labs  Lab 03/22/17 0929 03/23/17 0606 03/24/17 0426  WBC 14.3* 13.5* 9.3  NEUTROABS 13.0*  --   --   HGB 9.4* 9.8* 9.2*  HCT 29.9* 30.3* 28.7*  MCV 97.4 98.4 100.3*  PLT 205 188 628   Basic Metabolic Panel: Recent Labs  Lab 03/21/17 0427 03/22/17 0929 03/23/17 0606 03/24/17 0426  NA  --  138 136 137  K  --  4.7 4.6 4.2  CL  --  100* 102 104  CO2  --  27 24 25   GLUCOSE  --  225* 223* 144*  BUN  --  26* 23* 32*  CREATININE 0.78 0.75 0.78 0.86  CALCIUM  --  8.9 8.7* 8.6*   GFR: Estimated Creatinine Clearance: 78.4 mL/min (by C-G formula based on SCr of 0.86 mg/dL). Liver Function Tests: No results for input(s): AST, ALT,  ALKPHOS, BILITOT, PROT, ALBUMIN in the last 168 hours. No results for input(s): LIPASE, AMYLASE in the last 168 hours. No results for input(s): AMMONIA in the last 168 hours. Coagulation Profile: No results for input(s): INR, PROTIME in the last 168 hours. Cardiac Enzymes: No results for input(s): CKTOTAL, CKMB, CKMBINDEX, TROPONINI in the last 168 hours. BNP (last 3 results) No results for input(s): PROBNP in the last 8760 hours. HbA1C: No results for input(s): HGBA1C in the last 72 hours. CBG: Recent Labs  Lab 03/23/17 1144 03/23/17 1712 03/23/17 2021 03/24/17 0734 03/24/17 1208  GLUCAP 246* 188* 138* 123* 82   Lipid Profile: No results for input(s): CHOL, HDL, LDLCALC, TRIG, CHOLHDL, LDLDIRECT in the last 72 hours. Thyroid Function Tests: No results for input(s): TSH,  T4TOTAL, FREET4, T3FREE, THYROIDAB in the last 72 hours. Anemia Panel: No results for input(s): VITAMINB12, FOLATE, FERRITIN, TIBC, IRON, RETICCTPCT in the last 72 hours. Urine analysis: No results found for: COLORURINE, APPEARANCEUR, LABSPEC, PHURINE, GLUCOSEU, HGBUR, BILIRUBINUR, KETONESUR, PROTEINUR, UROBILINOGEN, NITRITE, LEUKOCYTESUR Sepsis Labs: @LABRCNTIP (procalcitonin:4,lacticidven:4)  )No results found for this or any previous visit (from the past 240 hour(s)).    Radiology Studies: No results found.   Scheduled Meds: . amLODipine  10 mg Oral Daily  . bisacodyl  10 mg Rectal Once  . carvedilol  25 mg Oral BID WC  . chlorhexidine  15 mL Mouth Rinse BID  . dexamethasone  6 mg Intravenous Q6H  . docusate sodium  100 mg Oral BID  . doxycycline  100 mg Oral Q12H  . feeding supplement (GLUCERNA SHAKE)  237 mL Oral BID BM  . ferrous sulfate  325 mg Oral BID WC  . insulin aspart  0-20 Units Subcutaneous TID WC  . insulin aspart  0-5 Units Subcutaneous QHS  . insulin aspart  7 Units Subcutaneous TID WC  . insulin glargine  20 Units Subcutaneous QHS  . mirtazapine  15 mg Oral QHS  .  nystatin-triamcinolone   Topical BID  . pantoprazole (PROTONIX) IV  40 mg Intravenous QHS  . senna  1 tablet Oral BID  . sodium chloride flush  10-40 mL Intracatheter Q12H   Continuous Infusions: . sodium chloride Stopped (03/23/17 1500)  . sodium chloride 10 mL/hr at 03/23/17 1500  . sodium chloride    . levETIRAcetam Stopped (03/24/17 0759)     LOS: 17 days    Time spent: 30 min    Janece Canterbury, MD [facility] Pager [phone]  If 7PM-7AM, please contact night-coverage [email or web address] Password [facility] 03/24/2017, 3:36 PM

## 2017-03-24 NOTE — Progress Notes (Signed)
Physical Therapy Treatment Patient Details Name: Denise Morton MRN: [id number] DOB: Oct 26, 1950 Today's Date: 03/24/2017    History of Present Illness  Denise Morton is a 67 y.o. female admitted with weakness and gastritis found to have cerebellar and occipital massess s/p resection 2/25 of right cerebellar mass, pt also with mets to liver, lung and bone, 3/6 with repeat sx for CSF leak repair.  PMHx of Breast CA, DM, central retinal vein occlusion right eye    PT Comments    Pt pleasant and able to recall conversation and instructions from this therapist yesterday. Pt with increased difficulty with left hip flexion and motor planning and control to rise from lower seated positions. Pt educated for sequence, positioning and HEP. Will continue to follow    Follow Up Recommendations  Supervision/Assistance - 24 hour;SNF     Equipment Recommendations  Rolling walker with 5" wheels    Recommendations for Other Services       Precautions / Restrictions Precautions Precautions: Fall    Mobility  Bed Mobility   Bed Mobility: Supine to Sit     Supine to sit: Supervision;HOB elevated     General bed mobility comments: supervision for lines, HOB 30   Transfers Overall transfer level: Needs assistance   Transfers: Sit to/from Stand Sit to Stand: Supervision;Mod assist         General transfer comment: supervision to stand from bed. Standing from toilet with use of rail required cues for sequence and mod assist. Pt with right trunk rotation and decreased motor planning with standing. Performed 3 trials from chair with min-mod assist with cues for sequence and difficulty initiating rise from low surface  Ambulation/Gait Ambulation/Gait assistance: Supervision Ambulation Distance (Feet): 300 Feet Assistive device: Rolling walker (2 wheeled) Gait Pattern/deviations: Step-through pattern;Decreased stride length;Trunk flexed   Gait velocity interpretation: Below normal speed for  age/gender General Gait Details: pt with slow and controlled gait today and able to recall need for self-regulation with activity with SpO2 >90% throughout on rA   Stairs            Wheelchair Mobility    Modified Rankin (Stroke Patients Only)       Balance Overall balance assessment: Needs assistance   Sitting balance-Leahy Scale: Good       Standing balance-Leahy Scale: Fair                              Cognition Arousal/Alertness: Awake/alert Behavior During Therapy: WFL for tasks assessed/performed Overall Cognitive Status: Impaired/Different from baseline Area of Impairment: Safety/judgement;Problem solving                       Following Commands: Follows multi-step commands with increased time Safety/Judgement: Decreased awareness of deficits     General Comments: on arrival pt working to open a soy milk container with foil pull seal and straw, pt stabbing with fork stating she was working at it for 10 min and couldn't figure it out      Training and development officer - Lower Extremity Long Arc Quad: AROM;Seated;Both;15 reps Hip Flexion/Marching: AROM;Both;Seated;15 reps    General Comments        Pertinent Vitals/Pain Pain Assessment: No/denies pain    Home Living                      Prior Function            PT  Goals (current goals can now be found in the care plan section) Progress towards PT goals: Progressing toward goals    Frequency           PT Plan Current plan remains appropriate    Co-evaluation              AM-PAC PT "6 Clicks" Daily Activity  Outcome Measure  Difficulty turning over in bed (including adjusting bedclothes, sheets and blankets)?: None Difficulty moving from lying on back to sitting on the side of the bed? : None Difficulty sitting down on and standing up from a chair with arms (e.g., wheelchair, bedside commode, etc,.)?: Unable Help needed moving to and from a bed to  chair (including a wheelchair)?: A Little Help needed walking in hospital room?: A Little Help needed climbing 3-5 steps with a railing? : A Little 6 Click Score: 18    End of Session Equipment Utilized During Treatment: Gait belt Activity Tolerance: Patient tolerated treatment well Patient left: in chair;with call bell/phone within reach;with chair alarm set Nurse Communication: Mobility status;Precautions PT Visit Diagnosis: Other symptoms and signs involving the nervous system (R29.898)     Time: 2330-0762 PT Time Calculation (min) (ACUTE ONLY): 26 min  Charges:  $Gait Training: 8-22 mins $Therapeutic Activity: 8-22 mins                    G Codes:       Elwyn Reach, [place]    [place] 03/24/2017, 12:04 PM

## 2017-03-24 NOTE — Progress Notes (Signed)
Subjective: Patient reports Awake and alert minimal headache  Objective: Vital signs in last 24 hours: Temp:  [97.6 F (36.4 C)-98.8 F (37.1 C)] 98.7 F (37.1 C) (03/08 0400) Pulse Rate:  [58-130] 64 (03/08 0600) Resp:  [12-24] 14 (03/08 0600) BP: (118-173)/(60-104) 145/93 (03/08 0600) SpO2:  [91 %-99 %] 98 % (03/08 0600) Arterial Line BP: (182)/(73) 182/73 (03/07 0800) Weight:  [104 kg (229 lb 4.5 oz)] 104 kg (229 lb 4.5 oz) (03/08 0430)  Intake/Output from previous day: 03/07 0701 - 03/08 0700 In: 788.6 [P.O.:100; I.V.:388.6; IV Piggyback:300] Out: -  Intake/Output this shift: No intake/output data recorded.  Slightly dysarthric otherwise neurologically intact dressing with very mild serosanguineous saturation.  Lab Results: Recent Labs    03/23/17 0606 03/24/17 0426  WBC 13.5* 9.3  HGB 9.8* 9.2*  HCT 30.3* 28.7*  PLT 188 161   BMET Recent Labs    03/23/17 0606 03/24/17 0426  NA 136 137  K 4.6 4.2  CL 102 104  CO2 24 25  GLUCOSE 223* 144*  BUN 23* 32*  CREATININE 0.78 0.86  CALCIUM 8.7* 8.6*    Studies/Results: No results found.  Assessment/Plan: Mobilized today with physical and occupational therapy transfer to the stepdown unit wean her Decadron  LOS: 17 days     Jerilyn Gillaspie P 03/24/2017, 7:05 AM

## 2017-03-25 LAB — CBC
HCT: 29.4 % — ABNORMAL LOW (ref 36.0–46.0)
Hemoglobin: 9.7 g/dL — ABNORMAL LOW (ref 12.0–15.0)
MCH: 32.6 pg (ref 26.0–34.0)
MCHC: 33 g/dL (ref 30.0–36.0)
MCV: 98.7 fL (ref 78.0–100.0)
PLATELETS: 165 10*3/uL (ref 150–400)
RBC: 2.98 MIL/uL — AB (ref 3.87–5.11)
RDW: 18.7 % — AB (ref 11.5–15.5)
WBC: 7.6 10*3/uL (ref 4.0–10.5)

## 2017-03-25 LAB — BASIC METABOLIC PANEL
Anion gap: 12 (ref 5–15)
BUN: 31 mg/dL — AB (ref 6–20)
CALCIUM: 8.9 mg/dL (ref 8.9–10.3)
CO2: 24 mmol/L (ref 22–32)
Chloride: 102 mmol/L (ref 101–111)
Creatinine, Ser: 0.71 mg/dL (ref 0.44–1.00)
GFR calc non Af Amer: >60 mL/min (ref >60)
Glucose, Bld: 221 mg/dL — ABNORMAL HIGH (ref 65–99)
Potassium: 4.6 mmol/L (ref 3.5–5.1)
Sodium: 138 mmol/L (ref 135–145)

## 2017-03-25 LAB — GLUCOSE, CAPILLARY
GLUCOSE-CAPILLARY: 212 mg/dL — AB (ref 65–99)
GLUCOSE-CAPILLARY: 263 mg/dL — AB (ref 65–99)
Glucose-Capillary: 203 mg/dL — ABNORMAL HIGH (ref 65–99)
Glucose-Capillary: 240 mg/dL — ABNORMAL HIGH (ref 65–99)

## 2017-03-25 MED ORDER — PANTOPRAZOLE SODIUM 40 MG PO TBEC
40.0000 mg | DELAYED_RELEASE_TABLET | Freq: Every day | ORAL | Status: DC
  Administered 2017-03-25 – 2017-04-13 (×20): 40 mg via ORAL
  Filled 2017-03-25 (×20): qty 1

## 2017-03-25 MED ORDER — CHLORHEXIDINE GLUCONATE CLOTH 2 % EX PADS
6.0000 | MEDICATED_PAD | Freq: Every day | CUTANEOUS | Status: DC
  Administered 2017-03-25 – 2017-03-30 (×5): 6 via TOPICAL

## 2017-03-25 NOTE — Progress Notes (Signed)
PROGRESS NOTE  Denise Morton  JSH:[id number] DOB: 21-Sep-1950 DOA: 03/07/2017 PCP: Seward Carol, MD  Brief Narrative:  67 y.o.femaleWith pmhx of BRCA, DM, admitted on 03/07/17 with weakness, dizziness, anorexia, speech difficulties and near syncope. MRI brain demonstrated metastatic lesions to the cerebellum and right occipital areas. She was initially admitted to [facility].  Neurosurgery was consulted and patient was transferred to [facility] on 2/23. She underwent suboccipital craniectomy for resection of right cerebellar meton 2/25 by Dr. Saintclair Halsted.  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  Symptomatically she is feeling much better with improved strength, speech, appetite.  Returned to the OR on 3/6 for correction of her CSF leak underwent reexploration of the suboccipital incision for repair of primary CSF leak utilizing DuraMatrix patch with fascial patch grafts.  She was restarted on high-dose dexamethasone postoperatively and admitted to the ICU.  CBGs trending down and steroids are gradually being tapered.    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  -  Continue Keppra  -  Decadron continued at same dose today -  Continue doxycycline per surgery -  PT recommending skilled nursing facility -  Patient anticipating discharge to skilled nursing facility and then starting some sort of chemotherapy and radiation as an outpatient. -Palliative care consultation to discuss goals of care  2]type 2 diabetes, blood sugars slightly elevated - continue Lantus to 20 units - Continue aspart with meals to 7 units 3 times daily -Continue high dose sliding scale insulin  3]hypertension, blood pressure trending down -  Continue Norvasc and Coreg -  Taper steroids per  neurosurgery  4]anemia of chronic disease -   Occult stool -  Continue iron supplementation (do not see that testing was performed)  Leukocytosis, unclear etiology   DVT prophylaxis:  SCDs  Code Status: Full code Family Communication: No family at bedside, patient alone Disposition Plan: Anticipate discharge to skilled nursing facility.  Even if she is ambulatory, she will likely need 24 hour supervision.     Consultants:   Neurosurgery  XRT oncology  PM&R  Procedures:  03/13/2017 suboccipital craniectomy for resection of right cerebellar met  03/22/2017:  Wound revision due to CSF leak   Antimicrobials:  Anti-infectives (From admission, onward)   Start     Dose/Rate Route Frequency Ordered Stop   03/23/17 1200  doxycycline (VIBRA-TABS) tablet 100 mg     100 mg Oral Every 12 hours 03/23/17 0954     03/22/17 2030  vancomycin (VANCOCIN) IVPB 1000 mg/200 mL premix     1,000 mg 200 mL/hr over 60 Minutes Intravenous Every 12 hours 03/22/17 1912 03/23/17 0939   03/22/17 1428  bacitracin 50,000 Units in sodium chloride irrigation 0.9 % 500 mL irrigation  Status:  Discontinued       As needed 03/22/17 1428 03/22/17 1550   03/20/17 1100  doxycycline (VIBRA-TABS) tablet 100 mg  Status:  Discontinued     100 mg Oral Every 12 hours 03/20/17 1011 03/23/17 0954   03/20/17 0000  doxycycline (VIBRA-TABS) 100 MG tablet     100 mg Oral Every 12 hours 03/20/17 1245     03/13/17 1930  vancomycin (VANCOCIN) IVPB 1000 mg/200 mL premix     1,000 mg 200 mL/hr over 60 Minutes Intravenous Every 12 hours 03/13/17 1836 03/14/17 0756   03/13/17 8502  bacitracin 50,000 Units in sodium chloride irrigation 0.9 % 500 mL irrigation  Status:  Discontinued       As needed 03/13/17 0858 03/13/17 1604   03/13/17 0730  vancomycin (VANCOCIN) IVPB 1000 mg/200 mL premix  Status:  Discontinued     1,000 mg 200 mL/hr over 60 Minutes Intravenous To Surgery 03/13/17 0729 03/13/17 1810        Subjective:  Having some CSF leak and has been placed on bedrest by neurosurgery.  They mentioned the possibility of placing a shunt and she is nervous about the possibility.  She is anxious to get out of the hospital and is frustrated by the duration of her hospitalization.  She denies blurry vision different than usual, headaches, focal weakness or numbness.   Objective: Vitals:   03/25/17 0400 03/25/17 0500 03/25/17 0800 03/25/17 1200  BP: (!) 144/81  (!) 162/88 (!) 147/79  Pulse: 65  81 75  Resp: _0 Temp:   98.4 F (36.9 C) 98 F (36.7 C)  TempSrc:   Oral Oral  SpO2: 99%  96% 98%  Weight:  104.1 kg (229 lb 9.6 oz)    Height:        Intake/Output Summary (Last 24 hours) at 03/25/2017 1551 Last data filed at 03/25/2017 1500 Gross per 24 hour  Intake 1237 ml  Output 2750 ml  Net -1513 ml   Filed Weights   03/23/17 0500 03/24/17 0430 03/25/17 0500  Weight: 104 kg (229 lb 4.5 oz) 104 kg (229 lb 4.5 oz) 104.1 kg (229 lb 9.6 oz)    Examination:  General exam:  Adult female.  No acute distress.  HEENT: Posterior scalp not observed today as the patient is on bedrest. MMM. Respiratory system: Clear to auscultation bilaterally Cardiovascular system: Regular rate and rhythm, normal S1/S2. No murmurs, rubs, gallops or clicks.  Warm extremities Gastrointestinal system: Normal active bowel sounds, soft, nondistended, nontender. MSK:  Normal tone and bulk, no lower extremity edema Neuro:  Grossly intact  Data Reviewed: I have personally reviewed following labs and imaging studies  CBC: Recent Labs  Lab 03/22/17 0929 03/23/17 0606 03/24/17 0426 03/25/17 0637  WBC 14.3* 13.5* 9.3 7.6  NEUTROABS 13.0*  --   --   --   HGB 9.4* 9.8* 9.2* 9.7*  HCT 29.9* 30.3* 28.7* 29.4*  MCV 97.4 98.4 100.3* 98.7  PLT 205 188 161 798   Basic Metabolic Panel: Recent Labs  Lab 03/21/17 0427 03/22/17 0929 03/23/17 0606 03/24/17 0426 03/25/17 0637  NA  --  138 136 137 138  K   --  4.7 4.6 4.2 4.6  CL  --  100* 102 104 102  CO2  --  _1 GLUCOSE  --  225* 223* 144* 221*  BUN  --  26* 23* 32* 31*  CREATININE 0.78 0.75 0.78 0.86 0.71  CALCIUM  --  8.9 8.7* 8.6* 8.9   GFR: Estimated Creatinine Clearance: 84.3 mL/min (by C-G formula based on SCr of 0.71 mg/dL). Liver Function Tests: No results for input(s): AST, ALT, ALKPHOS, BILITOT, PROT, ALBUMIN in the last 168 hours. No results for input(s): LIPASE, AMYLASE in the last 168 hours. No results for input(s): AMMONIA in the last 168 hours. Coagulation Profile: No results for input(s): INR, PROTIME in the last 168 hours. Cardiac Enzymes: No results for input(s): CKTOTAL, CKMB, CKMBINDEX, TROPONINI in the last 168 hours. BNP (last 3 results) No results for input(s): PROBNP in the last  8760 hours. HbA1C: No results for input(s): HGBA1C in the last 72 hours. CBG: Recent Labs  Lab 03/24/17 0734 03/24/17 1208 03/24/17 1651 03/25/17 0832 03/25/17 1204  GLUCAP 123* 82 197* 212* 203*   Lipid Profile: No results for input(s): CHOL, HDL, LDLCALC, TRIG, CHOLHDL, LDLDIRECT in the last 72 hours. Thyroid Function Tests: No results for input(s): TSH, T4TOTAL, FREET4, T3FREE, THYROIDAB in the last 72 hours. Anemia Panel: No results for input(s): VITAMINB12, FOLATE, FERRITIN, TIBC, IRON, RETICCTPCT in the last 72 hours. Urine analysis: No results found for: COLORURINE, APPEARANCEUR, LABSPEC, PHURINE, GLUCOSEU, HGBUR, BILIRUBINUR, KETONESUR, PROTEINUR, UROBILINOGEN, NITRITE, LEUKOCYTESUR Sepsis Labs: _0 (procalcitonin:4,lacticidven:4)  )No results found for this or any previous visit (from the past 240 hour(s)).    Radiology Studies: No results found.   Scheduled Meds: . amLODipine  10 mg Oral Daily  . carvedilol  25 mg Oral BID WC  . chlorhexidine  15 mL Mouth Rinse BID  . Chlorhexidine Gluconate Cloth  6 each Topical Q0600  . dexamethasone  6 mg Intravenous Q6H  . docusate sodium  100 mg  Oral BID  . doxycycline  100 mg Oral Q12H  . feeding supplement (GLUCERNA SHAKE)  237 mL Oral BID BM  . ferrous sulfate  325 mg Oral BID WC  . insulin aspart  0-20 Units Subcutaneous TID WC  . insulin aspart  0-5 Units Subcutaneous QHS  . insulin aspart  7 Units Subcutaneous TID WC  . insulin glargine  20 Units Subcutaneous QHS  . mirtazapine  15 mg Oral QHS  . nystatin-triamcinolone   Topical BID  . pantoprazole  40 mg Oral QHS  . senna  1 tablet Oral BID  . sodium chloride flush  10-40 mL Intracatheter Q12H   Continuous Infusions: . sodium chloride 10 mL/hr at 03/25/17 1500  . levETIRAcetam Stopped (03/25/17 6725)     LOS: 18 days    Time spent: 30 min    Janece Canterbury, MD [facility] Pager [phone]  If 7PM-7AM, please contact night-coverage [email or web address] Password [facility] 03/25/2017, 3:51 PM

## 2017-03-25 NOTE — Progress Notes (Signed)
NEUROSURGERY PROGRESS NOTE  Doing well.  No pain, Good strength  Incision still draining mild amount of csf.  Temp:  [97.5 F (36.4 C)-98.4 F (36.9 C)] 98.4 F (36.9 C) (03/09 0800) Pulse Rate:  [65-88] 81 (03/09 0800) Resp:  [10-23] 14 (03/09 0800) BP: (97-162)/(53-90) 162/88 (03/09 0800) SpO2:  [95 %-99 %] 96 % (03/09 0800) Weight:  [104.1 kg (229 lb 9.6 oz)] 104.1 kg (229 lb 9.6 oz) (03/09 0500)  Plan: Will continue to monitor incision site. No new recom.  Eleonore Chiquito, NP 03/25/2017 8:51 AM

## 2017-03-25 NOTE — Progress Notes (Signed)
CSW received call from insurance stating that they are concerned about approving this patient for snf. They requested palliative care see the patient for end of life goals and would like to know what the care plan is for the incision drainage (aren't sure that snf can manage well).  Percell Locus Delicia Berens LCSW [phone]

## 2017-03-26 DIAGNOSIS — Z515 Encounter for palliative care: Secondary | ICD-10-CM

## 2017-03-26 DIAGNOSIS — Z7189 Other specified counseling: Secondary | ICD-10-CM

## 2017-03-26 DIAGNOSIS — G960 Cerebrospinal fluid leak: Secondary | ICD-10-CM

## 2017-03-26 LAB — BASIC METABOLIC PANEL
ANION GAP: 8 (ref 5–15)
BUN: 30 mg/dL — ABNORMAL HIGH (ref 6–20)
CALCIUM: 8.8 mg/dL — AB (ref 8.9–10.3)
CO2: 25 mmol/L (ref 22–32)
CREATININE: 0.75 mg/dL (ref 0.44–1.00)
Chloride: 105 mmol/L (ref 101–111)
GFR calc Af Amer: >60 mL/min (ref >60)
GLUCOSE: 220 mg/dL — AB (ref 65–99)
Potassium: 4.4 mmol/L (ref 3.5–5.1)
Sodium: 138 mmol/L (ref 135–145)

## 2017-03-26 LAB — GLUCOSE, CAPILLARY
GLUCOSE-CAPILLARY: 188 mg/dL — AB (ref 65–99)
GLUCOSE-CAPILLARY: 271 mg/dL — AB (ref 65–99)
GLUCOSE-CAPILLARY: 251 mg/dL — AB (ref 65–99)
GLUCOSE-CAPILLARY: 201 mg/dL — AB (ref 65–99)

## 2017-03-26 LAB — CBC
HCT: 30.6 % — ABNORMAL LOW (ref 36.0–46.0)
HEMOGLOBIN: 9.9 g/dL — AB (ref 12.0–15.0)
MCH: 32.2 pg (ref 26.0–34.0)
MCHC: 32.4 g/dL (ref 30.0–36.0)
MCV: 99.7 fL (ref 78.0–100.0)
Platelets: 165 10*3/uL (ref 150–400)
RBC: 3.07 MIL/uL — ABNORMAL LOW (ref 3.87–5.11)
RDW: 19 % — AB (ref 11.5–15.5)
WBC: 7.4 10*3/uL (ref 4.0–10.5)

## 2017-03-26 MED ORDER — INSULIN ASPART 100 UNIT/ML ~~LOC~~ SOLN
9.0000 [IU] | Freq: Three times a day (TID) | SUBCUTANEOUS | Status: DC
  Administered 2017-03-26 – 2017-03-28 (×7): 9 [IU] via SUBCUTANEOUS

## 2017-03-26 MED ORDER — INSULIN GLARGINE 100 UNIT/ML ~~LOC~~ SOLN
22.0000 [IU] | Freq: Every day | SUBCUTANEOUS | Status: DC
  Administered 2017-03-26 – 2017-03-27 (×2): 22 [IU] via SUBCUTANEOUS
  Filled 2017-03-26 (×3): qty 0.22

## 2017-03-26 MED ORDER — POLYETHYLENE GLYCOL 3350 17 G PO PACK
17.0000 g | PACK | Freq: Every day | ORAL | Status: DC
  Administered 2017-03-26 – 2017-04-13 (×13): 17 g via ORAL
  Filled 2017-03-26 (×14): qty 1

## 2017-03-26 NOTE — Progress Notes (Signed)
CSW stopped by to discuss insurance verification and SNF placement for patient. Left a form to give to Provider for a peer to peer review. Contacted Dr. Sheran Fava with the information.

## 2017-03-26 NOTE — Progress Notes (Addendum)
CSW received a call from [place] at [facility] who stated pt's insurance authorization (for a SNF stay)from [employer] is technically denied at this time.    However, this denial is due to insufficient information about the pt's medical condition and if the pt's provider would like to conduct a peer-to-peer review with the [facility] Medical Director Dr. Coralie Carpen at ph: [phone] then the pt's request for an insurance auth for SNF stay will be reviewed again by Dr. Amalia Hailey.  Per Tammy, there was a question about "leaking": from pt's incision and about the possible need for a Palliative Care Consult (possibly other questions also).  CSW will seek the provider-on-call for [facility] and will forward this information and request for more info from Dr. Amalia Hailey.  CSW will continue to follow for D/C needs.  12:33 PM CSW provided the above information to pt's RN who provided this information to the provider.  Per RN, provider is contacted and aware. This Probation officer provided contact information for this Probation officer to [employer] RN, should any new social work needs arise.  CSW signing off, as social work intervention is no longer needed. Handoff is on chart for [place] [facility].  Alphonse Guild. Raeanna Soberanes, LCSW, LCAS, CSI Clinical Social Worker Ph: [phone]         Alphonse Guild. Rosaly Labarbera, LCSW, LCAS, CSI Clinical Social Worker Ph: [phone]

## 2017-03-26 NOTE — Progress Notes (Signed)
Dr. Sheran Fava alerted me that she is not the attending and cannot complete the peer to peer review. [place] neurosurgery and left a message for Dr. Saintclair Halsted to call back for the information regarding a peer to peer review with a Dr. Coralie Carpen from [place] @ [phone].

## 2017-03-26 NOTE — Progress Notes (Addendum)
NEUROSURGERY PROGRESS NOTE  Doing well. Good strength and sensation Incision has small amount of drainage at the top of her incision. Placed staples to help with oozing  Temp:  [97.9 F (36.6 C)-98.7 F (37.1 C)] 97.9 F (36.6 C) (03/10 0800) Pulse Rate:  [66-75] 70 (03/10 0400) Resp:  [7-20] 7 (03/10 0400) BP: (120-178)/(59-105) 178/105 (03/10 0400) SpO2:  [94 %-100 %] 100 % (03/10 0400) Weight:  [104 kg (229 lb 4.5 oz)] 104 kg (229 lb 4.5 oz) (03/10 0500)  Plan: Continue to monitor incision for drainage  Eleonore Chiquito, NP 03/26/2017 9:20 AM   Staples placed to stop csf leakage, await Dr Saintclair Halsted for final disposition

## 2017-03-26 NOTE — Progress Notes (Signed)
PROGRESS NOTE  Denise Morton  GUR:[id number] DOB: 02-17-1950 DOA: 03/07/2017 PCP: Seward Carol, MD  Brief Narrative:  67 y.o.femaleWith pmhx of BRCA, DM, admitted on 03/07/17 with weakness, dizziness, anorexia, speech difficulties and near syncope. MRI brain demonstrated metastatic lesions to the cerebellum and right occipital areas. She was initially admitted to [facility].  Neurosurgery was consulted and patient was transferred to [facility] on 2/23. She underwent suboccipital craniectomy for resection of right cerebellar meton 2/25 by Dr. Saintclair Halsted.  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  Symptomatically she is feeling much better with improved strength, speech, appetite.  Returned to the OR on 3/6 for correction of her CSF leak underwent reexploration of the suboccipital incision for repair of primary CSF leak utilizing DuraMatrix patch with fascial patch grafts.  She was restarted on high-dose dexamethasone postoperatively and admitted to the ICU.  CBGs trending down and steroids are gradually being tapered.  Had some hypertension this morning the may have been related to some nausea and vomiting.  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  -  Continue Keppra  -  Decadron continued at same dose today -  Continue doxycycline per surgery -  PT recommending skilled nursing facility:  Needs peer to peer -  Appreciate Palliative care consultation  2]type 2 diabetes, blood sugars slightly elevated - increase to Lantus to 22 units - Continue aspart with meals to 9 units 3 times daily - Continue high dose sliding scale insulin  3]hypertension, blood pressure had been trending down, then acutely elevated this morning -  Continue Norvasc and Coreg -  Continue labetalol and hydralazine  prn -  If BP persistently elevated, will start esmolol gtt -  Taper steroids per neurosurgery  4]anemia of chronic disease -   Occult stool -  Continue iron supplementation (do not see that testing was performed)  Leukocytosis, unclear etiology   DVT prophylaxis:  SCDs  Code Status: Full code Family Communication: No family at bedside, patient alone Disposition Plan: Anticipate discharge to skilled nursing facility.  Even if she is ambulatory, she will likely need 24 hour supervision.  Insurance is not approving SNF and requesting peer to peer.  May end up going home with home health services if denied, although she may be a candidate for CIR again.  Awaiting decision regarding need for additional treatment of her CSF leak by neurosurgery.     Consultants:   Neurosurgery  XRT oncology  PM&R  Procedures:  03/13/2017 suboccipital craniectomy for resection of right cerebellar met  03/22/2017:  Wound revision due to CSF leak   Antimicrobials:  Anti-infectives (From admission, onward)   Start     Dose/Rate Route Frequency Ordered Stop   03/23/17 1200  doxycycline (VIBRA-TABS) tablet 100 mg     100 mg Oral Every 12 hours 03/23/17 0954     03/22/17 2030  vancomycin (VANCOCIN) IVPB 1000 mg/200 mL premix     1,000 mg 200 mL/hr over 60 Minutes Intravenous Every 12 hours 03/22/17 1912 03/23/17 0939   03/22/17 1428  bacitracin 50,000 Units in sodium chloride irrigation 0.9 % 500 mL irrigation  Status:  Discontinued       As needed 03/22/17 1428 03/22/17 1550   03/20/17 1100  doxycycline (VIBRA-TABS) tablet 100 mg  Status:  Discontinued     100 mg  Oral Every 12 hours 03/20/17 1011 03/23/17 0954   03/20/17 0000  doxycycline (VIBRA-TABS) 100 MG tablet     100 mg Oral Every 12 hours 03/20/17 1245     03/13/17 1930  vancomycin (VANCOCIN) IVPB 1000 mg/200 mL premix     1,000 mg 200 mL/hr over 60 Minutes Intravenous Every 12 hours 03/13/17 1836 03/14/17 0756   03/13/17 0858  bacitracin  50,000 Units in sodium chloride irrigation 0.9 % 500 mL irrigation  Status:  Discontinued       As needed 03/13/17 0858 03/13/17 1604   03/13/17 0730  vancomycin (VANCOCIN) IVPB 1000 mg/200 mL premix  Status:  Discontinued     1,000 mg 200 mL/hr over 60 Minutes Intravenous To Surgery 03/13/17 0729 03/13/17 1810       Subjective:  Persistent small CSF leak.  Patient denies blurry vision, headache, focal weakness or numbness.  She denies chest pains or shortness of breath.  She had a large bowel movement overnight.  Her blood pressure has been very elevated this morning and that is making her anxious.  She vomited after breakfast today.   Objective: Vitals:   03/26/17 0300 03/26/17 0400 03/26/17 0500 03/26/17 0800  BP:  (!) 178/105    Pulse:  70    Resp:  (!) 7    Temp: 98.4 F (36.9 C)   97.9 F (36.6 C)  TempSrc: Oral   Oral  SpO2:  100%    Weight:   104 kg (229 lb 4.5 oz)   Height:        Intake/Output Summary (Last 24 hours) at 03/26/2017 1255 Last data filed at 03/25/2017 1811 Gross per 24 hour  Intake 941.83 ml  Output 600 ml  Net 341.83 ml   Filed Weights   03/24/17 0430 03/25/17 0500 03/26/17 0500  Weight: 104 kg (229 lb 4.5 oz) 104.1 kg (229 lb 9.6 oz) 104 kg (229 lb 4.5 oz)    Examination:  General exam:  Adult female, sitting up in bed.  No acute distress.  HEENT:  MMM.  Dry dressing just replaced and c/d/i.  Not removed for examination today since just placed.   Respiratory system: Clear to auscultation bilaterally Cardiovascular system: Regular rate and rhythm, normal S1/S2. No murmurs, rubs, gallops or clicks.  Warm extremities Gastrointestinal system: Normal active bowel sounds, soft, nondistended, nontender. MSK:  Normal tone and bulk, no lower extremity edema Neuro:  Grossly intact   Data Reviewed: I have personally reviewed following labs and imaging studies  CBC: Recent Labs  Lab 03/22/17 0929 03/23/17 0606 03/24/17 0426 03/25/17 0637  03/26/17 0515  WBC 14.3* 13.5* 9.3 7.6 7.4  NEUTROABS 13.0*  --   --   --   --   HGB 9.4* 9.8* 9.2* 9.7* 9.9*  HCT 29.9* 30.3* 28.7* 29.4* 30.6*  MCV 97.4 98.4 100.3* 98.7 99.7  PLT 205 188 161 165 300   Basic Metabolic Panel: Recent Labs  Lab 03/22/17 0929 03/23/17 0606 03/24/17 0426 03/25/17 0637 03/26/17 0515  NA 138 136 137 138 138  K 4.7 4.6 4.2 4.6 4.4  CL 100* 102 104 102 105  CO2 27 24 25 24 25   GLUCOSE 225* 223* 144* 221* 220*  BUN 26* 23* 32* 31* 30*  CREATININE 0.75 0.78 0.86 0.71 0.75  CALCIUM 8.9 8.7* 8.6* 8.9 8.8*   GFR: Estimated Creatinine Clearance: 84.3 mL/min (by C-G formula based on SCr of 0.75 mg/dL). Liver Function Tests: No results for input(s): AST, ALT, ALKPHOS,  BILITOT, PROT, ALBUMIN in the last 168 hours. No results for input(s): LIPASE, AMYLASE in the last 168 hours. No results for input(s): AMMONIA in the last 168 hours. Coagulation Profile: No results for input(s): INR, PROTIME in the last 168 hours. Cardiac Enzymes: No results for input(s): CKTOTAL, CKMB, CKMBINDEX, TROPONINI in the last 168 hours. BNP (last 3 results) No results for input(s): PROBNP in the last 8760 hours. HbA1C: No results for input(s): HGBA1C in the last 72 hours. CBG: Recent Labs  Lab 03/25/17 1204 03/25/17 1752 03/25/17 2205 03/26/17 0748 03/26/17 1142  GLUCAP 203* 240* 263* 188* 271*   Lipid Profile: No results for input(s): CHOL, HDL, LDLCALC, TRIG, CHOLHDL, LDLDIRECT in the last 72 hours. Thyroid Function Tests: No results for input(s): TSH, T4TOTAL, FREET4, T3FREE, THYROIDAB in the last 72 hours. Anemia Panel: No results for input(s): VITAMINB12, FOLATE, FERRITIN, TIBC, IRON, RETICCTPCT in the last 72 hours. Urine analysis: No results found for: COLORURINE, APPEARANCEUR, LABSPEC, PHURINE, GLUCOSEU, HGBUR, BILIRUBINUR, KETONESUR, PROTEINUR, UROBILINOGEN, NITRITE, LEUKOCYTESUR Sepsis Labs: @LABRCNTIP (procalcitonin:4,lacticidven:4)  )No results found  for this or any previous visit (from the past 240 hour(s)).    Radiology Studies: No results found.   Scheduled Meds: . amLODipine  10 mg Oral Daily  . carvedilol  25 mg Oral BID WC  . chlorhexidine  15 mL Mouth Rinse BID  . Chlorhexidine Gluconate Cloth  6 each Topical Q0600  . dexamethasone  6 mg Intravenous Q6H  . docusate sodium  100 mg Oral BID  . doxycycline  100 mg Oral Q12H  . feeding supplement (GLUCERNA SHAKE)  237 mL Oral BID BM  . ferrous sulfate  325 mg Oral BID WC  . insulin aspart  0-20 Units Subcutaneous TID WC  . insulin aspart  0-5 Units Subcutaneous QHS  . insulin aspart  9 Units Subcutaneous TID WC  . insulin glargine  22 Units Subcutaneous QHS  . mirtazapine  15 mg Oral QHS  . nystatin-triamcinolone   Topical BID  . pantoprazole  40 mg Oral QHS  . polyethylene glycol  17 g Oral Daily  . senna  1 tablet Oral BID  . sodium chloride flush  10-40 mL Intracatheter Q12H   Continuous Infusions: . sodium chloride 10 mL/hr at 03/25/17 1500  . levETIRAcetam Stopped (03/26/17 0535)     LOS: 19 days    Time spent: 30 min    Janece Canterbury, MD [facility] Pager [phone]  If 7PM-7AM, please contact night-coverage [email or web address] Password [facility] 03/26/2017, 12:55 PM

## 2017-03-26 NOTE — Consult Note (Signed)
Consultation Note Date: 03/26/2017   Patient Name: Denise Morton  DOB: 19-Jan-1950  MRN: [id number]  Age / Sex: 67 y.o., female  PCP: Seward Carol, MD Referring Physician: Kary Kos, MD  Reason for Consultation: Establishing goals of care and Psychosocial/spiritual support  HPI/Patient Profile: 67 y.o. 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  Per CT scan patient was found to have now a large cerebellar of CSF leak.  On 03/22/2017 she returned to the OR for repair of this leak.  Consult ordered for goals of care.  She is a patient of Dr. Geralyn Flash.   Clinical Assessment and Goals of Care: Met with patient, chart reviewed.  Patient's brother and sister-in-law are in the room.  Patient's affect is bright and attitude is optimistic she does have a good understanding of her health condition in terms of metastatic breast cancer and now that it is an important now that it is in her brain.  We discussed various things in terms of advanced directives specifically CODE STATUS, defined those Patient is a full code and that is her desire to remain as such.  She has filled out an advanced directive and has named her son, Nathen May, as her healthcare POA if she were in a circumstance that she could no longer speak for herself  Patient at this point is the primary decision maker.  If she were unable to speak for herself, her son Nathen May, would be her healthcare proxy    SUMMARY OF RECOMMENDATIONS   Confirmed full code. Full scope of treatment Her goals are to get strong enough once the CSF leak resolves to go  inpatient rehab if she is accepted, if not outpatient SNF for rehab then resume chemo and radiation through Dr. Sonny Dandy Code  Status/Advance Care Planning:  Full code    Symptom Management:   Pain: Would discontinueVicodin 1 tab every 4 hours as needed for moderate pain, and continue oxycdone 61m q4 PRN; Dilaudid IV 0.5 mg every 2 hours as needed for severe pain; DC MS04 IV  Palliative Prophylaxis:   Aspiration, Bowel Regimen, Delirium Protocol, Eye Care, Frequent Pain Assessment, Oral Care and Turn Reposition  Additional Recommendations (Limitations, Scope, Preferences):  Full Scope Treatment  Psycho-social/Spiritual:   Desire for further Chaplaincy support:no  Additional Recommendations: Referral to Community Resources   Prognosis:   Unable to determine  Discharge Planning: To Be Determined      Primary Diagnoses: Present on Admission: . Syncope . Brain metastases ([place] . CSF leak   I have reviewed the medical record, interviewed the patient and family, and examined the patient. The following aspects are pertinent.  Past Medical History:  Diagnosis Date  . CRVO (central retinal vein occlusion)   . Diabetes mellitus without complication ([place]   . Metastatic cancer ([place] dx'd 07/2014   liver, lung and bone  . right breast ca dx'd 07/2014  Social History   Socioeconomic History  . Marital status: Single    Spouse name: None  . Number of children: None  . Years of education: None  . Highest education level: None  Social Needs  . Financial resource strain: None  . Food insecurity - worry: None  . Food insecurity - inability: None  . Transportation needs - medical: None  . Transportation needs - non-medical: None  Occupational History  . None  Tobacco Use  . Smoking status: Former Smoker    Packs/day: 0.50    Years: 30.00    Pack years: 15.00  . Smokeless tobacco: Never Used  Substance and Sexual Activity  . Alcohol use: No  . Drug use: No  . Sexual activity: No  Other Topics Concern  . None  Social History Narrative  . None   Family History  Problem Relation Age  of Onset  . Heart failure Father    Scheduled Meds: . amLODipine  10 mg Oral Daily  . carvedilol  25 mg Oral BID WC  . chlorhexidine  15 mL Mouth Rinse BID  . Chlorhexidine Gluconate Cloth  6 each Topical Q0600  . dexamethasone  6 mg Intravenous Q6H  . docusate sodium  100 mg Oral BID  . doxycycline  100 mg Oral Q12H  . feeding supplement (GLUCERNA SHAKE)  237 mL Oral BID BM  . ferrous sulfate  325 mg Oral BID WC  . insulin aspart  0-20 Units Subcutaneous TID WC  . insulin aspart  0-5 Units Subcutaneous QHS  . insulin aspart  9 Units Subcutaneous TID WC  . insulin glargine  22 Units Subcutaneous QHS  . mirtazapine  15 mg Oral QHS  . nystatin-triamcinolone   Topical BID  . pantoprazole  40 mg Oral QHS  . polyethylene glycol  17 g Oral Daily  . senna  1 tablet Oral BID  . sodium chloride flush  10-40 mL Intracatheter Q12H   Continuous Infusions: . sodium chloride 10 mL/hr at 03/25/17 1500  . levETIRAcetam Stopped (03/26/17 0535)   PRN Meds:.acetaminophen **OR** acetaminophen, bacitracin, bisacodyl, hydrALAZINE, HYDROcodone-acetaminophen, HYDROmorphone (DILAUDID) injection, labetalol, LORazepam, morphine injection, naLOXone (NARCAN)  injection, ondansetron **OR** ondansetron (ZOFRAN) IV, oxyCODONE, prochlorperazine, promethazine, sodium chloride flush Medications Prior to Admission:  Prior to Admission medications   Medication Sig Start Date End Date Taking? Authorizing Provider  lidocaine-prilocaine (EMLA) cream APPLY TO THE AFFECTED AREA ONCE AS DIRECTED 03/28/16  Yes Nicholas Lose, MD  loperamide (IMODIUM) 2 MG capsule Take 2 mg by mouth as needed for diarrhea or loose stools.   Yes [provider]  Multiple Vitamin (MULTIVITAMIN WITH MINERALS) TABS tablet Take 1 tablet by mouth daily.   Yes [provider]  prochlorperazine (COMPAZINE) 10 MG tablet TAKE 1 TABLET(10 MG) BY MOUTH EVERY 6 HOURS AS NEEDED FOR NAUSEA OR VOMITING 01/12/17  Yes Nicholas Lose, MD    amLODipine (NORVASC) 10 MG tablet Take 1 tablet (10 mg total) by mouth daily. 03/17/17   Georgette Shell, MD  bacitracin ointment Apply topically as needed for wound care. 03/17/17   Georgette Shell, MD  bisacodyl (DULCOLAX) 5 MG EC tablet Take 2 tablets (10 mg total) by mouth daily as needed for moderate constipation. 03/18/17   Georgette Shell, MD  bisacodyl (DULCOLAX) 5 MG EC tablet Take 1 tablet (5 mg total) by mouth daily as needed for moderate constipation. 03/18/17 03/18/18  Georgette Shell, MD  carvedilol (COREG) 25 MG tablet Take 1 tablet (25  mg total) by mouth 2 (two) times daily with a meal. 03/20/17   Georgette Shell, MD  dexamethasone (DECADRON) 4 MG tablet Take 1 tablet (4 mg total) by mouth every 8 (eight) hours. 03/17/17   Georgette Shell, MD  docusate sodium (COLACE) 100 MG capsule Take 1 capsule (100 mg total) by mouth 2 (two) times daily. 03/17/17   Georgette Shell, MD  doxycycline (VIBRA-TABS) 100 MG tablet Take 1 tablet (100 mg total) by mouth every 12 (twelve) hours. 03/20/17   Georgette Shell, MD  ferrous sulfate 325 (65 FE) MG tablet Take 1 tablet (325 mg total) by mouth 2 (two) times daily with a meal. 03/20/17   Georgette Shell, MD  gabapentin (NEURONTIN) 300 MG capsule Take 1 capsule (300 mg total) by mouth 3 (three) times daily. 03/17/17   Georgette Shell, MD  insulin aspart (NOVOLOG) 100 UNIT/ML injection Inject 4 Units into the skin 3 (three) times daily with meals. 03/17/17   Georgette Shell, MD  insulin aspart (NOVOLOG) 100 UNIT/ML injection Inject 6 Units into the skin 3 (three) times daily with meals. 03/20/17   Georgette Shell, MD  insulin glargine (LANTUS) 100 UNIT/ML injection Inject 0.2 mLs (20 Units total) into the skin at bedtime. 03/20/17   Georgette Shell, MD  levETIRAcetam (KEPPRA) 500 MG tablet Take 1 tablet (500 mg total) by mouth 2 (two) times daily. 03/17/17   Georgette Shell, MD  mirtazapine (REMERON SOL-TAB) 15  MG disintegrating tablet Take 1 tablet (15 mg total) by mouth at bedtime. 03/17/17   Georgette Shell, MD  nystatin-triamcinolone [facility] II) cream Apply topically 2 (two) times daily. 03/17/17   Georgette Shell, MD  ondansetron (ZOFRAN) 8 MG tablet Take 1 tablet (8 mg total) by mouth every 8 (eight) hours as needed for nausea. 03/17/17   Georgette Shell, MD  oxyCODONE (OXY IR/ROXICODONE) 5 MG immediate release tablet Take 1 tablet (5 mg total) by mouth every 4 (four) hours as needed for moderate pain. 03/17/17   Georgette Shell, MD   Allergies  Allergen Reactions  . Penicillin G Rash    Has patient had a PCN reaction causing immediate rash, facial/tongue/throat swelling, SOB or lightheadedness with hypotension: Unknown Has patient had a PCN reaction causing severe rash involving mucus membranes or skin necrosis: Unknown Has patient had a PCN reaction that required hospitalization: Unknown Has patient had a PCN reaction occurring within the last 10 years: Unknown If all of the above answers are "NO", then may proceed with Cephalosporin use.   . Sulfa Antibiotics Rash   Review of Systems  Constitutional: Positive for activity change and fatigue.  HENT: Negative.   Respiratory: Negative.   Cardiovascular: Positive for palpitations.  Endocrine: Negative.   Genitourinary: Positive for difficulty urinating.  Musculoskeletal: Positive for myalgias.  Neurological: Positive for dizziness, speech difficulty, light-headedness and headaches.  Hematological: Bruises/bleeds easily.  Psychiatric/Behavioral: Positive for decreased concentration.    Physical Exam  Constitutional: She appears well-developed and well-nourished.  Older female seen in her hospital room.  She is in no acute distress, alert and oriented x3  HENT:  Head: Normocephalic and atraumatic.  Neck: Normal range of motion.  Cardiovascular: Normal rate.  Abdominal: Soft.  Musculoskeletal: Normal range of motion.   Neurological: She is alert.  Skin: Skin is warm and dry. There is pallor.  Psychiatric:  Mild anxiety  Nursing note and vitals reviewed.   Vital Signs: BP (!) 178/105  Pulse 70   Temp 97.9 F (36.6 C) (Oral)   Resp (!) 7   Ht _0  (1.676 m)   Wt 104 kg (229 lb 4.5 oz)   SpO2 100%   BMI 37.01 kg/m  Pain Assessment: No/denies pain POSS *See Group Information*: 1-Acceptable,Awake and alert Pain Score: 2    SpO2: SpO2: 100 % O2 Device:SpO2: 100 % O2 Flow Rate: .O2 Flow Rate (L/min): 2 L/min  IO: Intake/output summary:   Intake/Output Summary (Last 24 hours) at 03/26/2017 1141 Last data filed at 03/25/2017 1811 Gross per 24 hour  Intake 941.83 ml  Output 1350 ml  Net -408.17 ml    LBM: Last BM Date: 03/25/17 Baseline Weight: Weight: 89.8 kg (198 lb) Most recent weight: Weight: 104 kg (229 lb 4.5 oz)     Palliative Assessment/Data:   Flowsheet Rows     Most Recent Value  Intake Tab  Referral Department  Hospitalist  Unit at Time of Referral  ICU  Palliative Care Primary Diagnosis  Neurology  Date Notified  03/25/17  Reason for referral  Clarify Goals of Care, Psychosocial or Spiritual support  Date of Admission  04/04/17  Date first seen by Palliative Care  03/26/17  # of days Palliative referral response time  1 Day(s)  # of days IP prior to Palliative referral  -10  Clinical Assessment  Palliative Performance Scale Score  40%  Pain Min Last 24 hours  1  Dyspnea Max Last 24 Hours  2  Nausea Max Last 24 Hours  0  Nausea Min Last 24 Hours  0  Anxiety Max Last 24 Hours  3  Anxiety Min Last 24 Hours  2  Other Max Last 24 Hours  0  Psychosocial & Spiritual Assessment  Palliative Care Outcomes  Patient/Family meeting held?  Yes  Who was at the meeting?  pt, pt's brother and SIL  Palliative Care follow-up planned  Yes, Facility      Time In: 1937 Time Out: 1200 Time Total: 75 min Greater than 50%  of this time was spent counseling and coordinating care  related to the above assessment and plan.  Signed by: Dory Horn, NP   Please contact Palliative Medicine Team phone at [phone] for questions and concerns.  For individual provider: See Shea Evans

## 2017-03-27 ENCOUNTER — Inpatient Hospital Stay (HOSPITAL_COMMUNITY): Payer: PPO

## 2017-03-27 DIAGNOSIS — R531 Weakness: Secondary | ICD-10-CM

## 2017-03-27 LAB — GLUCOSE, CAPILLARY
GLUCOSE-CAPILLARY: 173 mg/dL — AB (ref 65–99)
GLUCOSE-CAPILLARY: 103 mg/dL — AB (ref 65–99)
Glucose-Capillary: 143 mg/dL — ABNORMAL HIGH (ref 65–99)
Glucose-Capillary: 249 mg/dL — ABNORMAL HIGH (ref 65–99)
Glucose-Capillary: 241 mg/dL — ABNORMAL HIGH (ref 65–99)

## 2017-03-27 LAB — CBC
HEMATOCRIT: 31.1 % — AB (ref 36.0–46.0)
HEMOGLOBIN: 10.3 g/dL — AB (ref 12.0–15.0)
MCH: 32.8 pg (ref 26.0–34.0)
MCHC: 33.1 g/dL (ref 30.0–36.0)
MCV: 99 fL (ref 78.0–100.0)
Platelets: 145 10*3/uL — ABNORMAL LOW (ref 150–400)
RBC: 3.14 MIL/uL — ABNORMAL LOW (ref 3.87–5.11)
RDW: 18.7 % — AB (ref 11.5–15.5)
WBC: 8.5 10*3/uL (ref 4.0–10.5)

## 2017-03-27 LAB — BASIC METABOLIC PANEL
ANION GAP: 9 (ref 5–15)
BUN: 28 mg/dL — ABNORMAL HIGH (ref 6–20)
CHLORIDE: 105 mmol/L (ref 101–111)
CO2: 25 mmol/L (ref 22–32)
Calcium: 8.9 mg/dL (ref 8.9–10.3)
Creatinine, Ser: 0.61 mg/dL (ref 0.44–1.00)
GFR calc Af Amer: >60 mL/min (ref >60)
GFR calc non Af Amer: >60 mL/min (ref >60)
Glucose, Bld: 146 mg/dL — ABNORMAL HIGH (ref 65–99)
POTASSIUM: 4.2 mmol/L (ref 3.5–5.1)
SODIUM: 139 mmol/L (ref 135–145)

## 2017-03-27 MED ORDER — LABETALOL HCL 200 MG PO TABS
200.0000 mg | ORAL_TABLET | Freq: Three times a day (TID) | ORAL | Status: DC
  Administered 2017-03-27 – 2017-04-14 (×46): 200 mg via ORAL
  Filled 2017-03-27: qty 1
  Filled 2017-03-27: qty 2
  Filled 2017-03-27 (×5): qty 1
  Filled 2017-03-27: qty 2
  Filled 2017-03-27 (×4): qty 1
  Filled 2017-03-27: qty 2
  Filled 2017-03-27 (×3): qty 1
  Filled 2017-03-27: qty 2
  Filled 2017-03-27 (×5): qty 1
  Filled 2017-03-27: qty 2
  Filled 2017-03-27 (×3): qty 1
  Filled 2017-03-27: qty 2
  Filled 2017-03-27 (×4): qty 1
  Filled 2017-03-27: qty 2
  Filled 2017-03-27 (×3): qty 1
  Filled 2017-03-27: qty 2
  Filled 2017-03-27 (×2): qty 1
  Filled 2017-03-27 (×4): qty 2
  Filled 2017-03-27 (×2): qty 1
  Filled 2017-03-27: qty 2
  Filled 2017-03-27 (×4): qty 1
  Filled 2017-03-27: qty 2
  Filled 2017-03-27 (×2): qty 1
  Filled 2017-03-27: qty 2

## 2017-03-27 MED ORDER — DEXAMETHASONE SODIUM PHOSPHATE 10 MG/ML IJ SOLN
4.0000 mg | Freq: Four times a day (QID) | INTRAMUSCULAR | Status: DC
  Administered 2017-03-27 – 2017-03-31 (×14): 4 mg via INTRAVENOUS
  Filled 2017-03-27 (×14): qty 1

## 2017-03-27 MED ORDER — LISINOPRIL 5 MG PO TABS
5.0000 mg | ORAL_TABLET | Freq: Every day | ORAL | Status: DC
  Administered 2017-03-27 – 2017-04-14 (×19): 5 mg via ORAL
  Filled 2017-03-27 (×19): qty 1

## 2017-03-27 NOTE — Progress Notes (Signed)
Passed on the information from CSW to Dr. Saintclair Halsted after his assessment of patient this morning. He stated that it is too early for placement to a SNF.

## 2017-03-27 NOTE — Progress Notes (Addendum)
PROGRESS NOTE  Denise Morton  QGB:[id number] DOB: [birth year] DOA: 03/07/2017 PCP: Seward Carol, MD  Brief Narrative:  67 y.o.femaleWith pmhx of BRCA, DM, admitted on 03/07/17 with weakness, dizziness, anorexia, speech difficulties and near syncope. MRI brain demonstrated metastatic lesions to the cerebellum and right occipital areas. She was initially admitted to [facility].  Neurosurgery was consulted and patient was transferred to [facility] on 2/23. She underwent suboccipital craniectomy for resection of right cerebellar meton 2/25 by Dr. Saintclair Halsted.  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  Symptomatically she is feeling much better with improved strength, speech, appetite.  Returned to the OR on 3/6 for correction of her CSF leak underwent reexploration of the suboccipital incision for repair of primary CSF leak utilizing DuraMatrix patch with fascial patch grafts.  She has continued to have a slow CSF leak that may stop on its own, but if not, she may need a shunt.  Dr. Saintclair Halsted planning to decide in the next 24-48 hours.  Starting to taper steroids more rapidly so will need some more rapid blood pressure and insulin adjustments over the next week.    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 Peris -  Continue Keppra  -  Decadron reduced today and planning to continue to reduce over the next week -  Doxycycline per surgery -  Appreciate Palliative care consultation  2]type 2 diabetes, blood sugars slightly elevated - Continue Lantus to 22 units - Continue aspart with meals to 9 units 3 times daily - Continue high dose sliding scale insulin  3]hypertension, blood pressure elevated last couple of days -  Continue Norvasc -  Change coreg to labetalol -  Add lisinopril  -  Continue  labetalol and hydralazine prn  4]anemia of chronic disease, hemoglobin improving -   Occult stool -  Continue iron supplementation (do not see that testing was performed)  Leukocytosis, resolved.   DVT prophylaxis:  SCDs  Code Status: Full code Family Communication:  Brother and mother at bedside today Disposition Plan:  Awaiting decision regarding need for additional treatment of her CSF leak by neurosurgery.     Consultants:   Neurosurgery  XRT oncology  PM&R  Procedures:  03/13/2017 suboccipital craniectomy for resection of right cerebellar met  03/22/2017:  Wound revision due to CSF leak   Antimicrobials:  Anti-infectives (From admission, onward)   Start     Dose/Rate Route Frequency Ordered Stop   03/23/17 1200  doxycycline (VIBRA-TABS) tablet 100 mg     100 mg Oral Every 12 hours 03/23/17 0954     03/22/17 2030  vancomycin (VANCOCIN) IVPB 1000 mg/200 mL premix     1,000 mg 200 mL/hr over 60 Minutes Intravenous Every 12 hours 03/22/17 1912 03/23/17 0939   03/22/17 1428  bacitracin 50,000 Units in sodium chloride irrigation 0.9 % 500 mL irrigation  Status:  Discontinued       As needed 03/22/17 1428 03/22/17 1550   03/20/17 1100  doxycycline (VIBRA-TABS) tablet 100 mg  Status:  Discontinued     100 mg Oral Every 12 hours 03/20/17 1011 03/23/17 0954   03/20/17 0000  doxycycline (VIBRA-TABS) 100 MG tablet     100 mg Oral Every 12 hours 03/20/17 1245     03/13/17 1930  vancomycin (VANCOCIN) IVPB 1000 mg/200 mL premix  1,000 mg 200 mL/hr over 60 Minutes Intravenous Every 12 hours 03/13/17 1836 03/14/17 0756   03/13/17 0858  bacitracin 50,000 Units in sodium chloride irrigation 0.9 % 500 mL irrigation  Status:  Discontinued       As needed 03/13/17 0858 03/13/17 1604   03/13/17 0730  vancomycin (VANCOCIN) IVPB 1000 mg/200 mL premix  Status:  Discontinued     1,000 mg 200 mL/hr over 60 Minutes Intravenous To Surgery 03/13/17 0729 03/13/17 1810        Subjective:  Nausea has improved.  She vomited only once yesterday morning and then was able to tolerate her diet for the rest of the day.  Her blood sugars have come down some and she is very pleased about that.  She denies any change to her blurry vision, no focal weakness.  She states the dressing on her scalp was replaced about 6 AM and it was wet at the time it was changed but she does not feel that there has been much more drainage since that time.  Objective: Vitals:   03/27/17 0635 03/27/17 0700 03/27/17 0955 03/27/17 1259  BP:  (!) 137/96  (!) 171/113  Pulse:  63 76   Resp:  10 (!) 25   Temp:  97.6 F (36.4 C)    TempSrc:  Oral    SpO2:  97% 97%   Weight: 102.7 kg (226 lb 6.6 oz)     Height:        Intake/Output Summary (Last 24 hours) at 03/27/2017 1448 Last data filed at 03/27/2017 1300 Gross per 24 hour  Intake 240 ml  Output 2000 ml  Net -1760 ml   Filed Weights   03/25/17 0500 03/26/17 0500 03/27/17 0635  Weight: 104.1 kg (229 lb 9.6 oz) 104 kg (229 lb 4.5 oz) 102.7 kg (226 lb 6.6 oz)    Examination:   General exam:  Adult female.  No acute distress.  HEENT:  MMM, dressing on posterior scalp removed today briefly for examination.  Her dressing was dry since 6 AM.  Her incision appears to be well approximated, no surrounding erythema, minimal induration at the bottom.  No purulence Respiratory system: Clear to auscultation bilaterally Cardiovascular system: Regular rate and rhythm, normal S1/S2. No murmurs, rubs, gallops or clicks.  Warm extremities Gastrointestinal system: Normal active bowel sounds, soft, nondistended, nontender. MSK:  Normal tone and bulk, no lower extremity edema Neuro: Diffusely weak  Data Reviewed: I have personally reviewed following labs and imaging studies  CBC: Recent Labs  Lab 03/22/17 0929 03/23/17 0606 03/24/17 0426 03/25/17 0637 03/26/17 0515 03/27/17 0500  WBC 14.3* 13.5* 9.3 7.6 7.4 8.5  NEUTROABS 13.0*  --   --    --   --   --   HGB 9.4* 9.8* 9.2* 9.7* 9.9* 10.3*  HCT 29.9* 30.3* 28.7* 29.4* 30.6* 31.1*  MCV 97.4 98.4 100.3* 98.7 99.7 99.0  PLT 205 188 161 165 165 756*   Basic Metabolic Panel: Recent Labs  Lab 03/23/17 0606 03/24/17 0426 03/25/17 0637 03/26/17 0515 03/27/17 0500  NA 136 137 138 138 139  K 4.6 4.2 4.6 4.4 4.2  CL 102 104 102 105 105  CO2 _0 GLUCOSE 223* 144* 221* 220* 146*  BUN 23* 32* 31* 30* 28*  CREATININE 0.78 0.86 0.71 0.75 0.61  CALCIUM 8.7* 8.6* 8.9 8.8* 8.9   GFR: Estimated Creatinine Clearance: 83.8 mL/min (by C-G formula based on SCr of 0.61 mg/dL). Liver Function  Tests: No results for input(s): AST, ALT, ALKPHOS, BILITOT, PROT, ALBUMIN in the last 168 hours. No results for input(s): LIPASE, AMYLASE in the last 168 hours. No results for input(s): AMMONIA in the last 168 hours. Coagulation Profile: No results for input(s): INR, PROTIME in the last 168 hours. Cardiac Enzymes: No results for input(s): CKTOTAL, CKMB, CKMBINDEX, TROPONINI in the last 168 hours. BNP (last 3 results) No results for input(s): PROBNP in the last 8760 hours. HbA1C: No results for input(s): HGBA1C in the last 72 hours. CBG: Recent Labs  Lab 03/26/17 1142 03/26/17 1612 03/26/17 2202 03/27/17 0728 03/27/17 1212  GLUCAP 271* 251* 201* 143* 173*   Lipid Profile: No results for input(s): CHOL, HDL, LDLCALC, TRIG, CHOLHDL, LDLDIRECT in the last 72 hours. Thyroid Function Tests: No results for input(s): TSH, T4TOTAL, FREET4, T3FREE, THYROIDAB in the last 72 hours. Anemia Panel: No results for input(s): VITAMINB12, FOLATE, FERRITIN, TIBC, IRON, RETICCTPCT in the last 72 hours. Urine analysis: No results found for: COLORURINE, APPEARANCEUR, LABSPEC, PHURINE, GLUCOSEU, HGBUR, BILIRUBINUR, KETONESUR, PROTEINUR, UROBILINOGEN, NITRITE, LEUKOCYTESUR Sepsis Labs: _0 (procalcitonin:4,lacticidven:4)  )No results found for this or any previous visit (from the past 240  hour(s)).    Radiology Studies: No results found.   Scheduled Meds: . amLODipine  10 mg Oral Daily  . chlorhexidine  15 mL Mouth Rinse BID  . Chlorhexidine Gluconate Cloth  6 each Topical Q0600  . dexamethasone  4 mg Intravenous Q6H  . docusate sodium  100 mg Oral BID  . doxycycline  100 mg Oral Q12H  . feeding supplement (GLUCERNA SHAKE)  237 mL Oral BID BM  . ferrous sulfate  325 mg Oral BID WC  . insulin aspart  0-20 Units Subcutaneous TID WC  . insulin aspart  0-5 Units Subcutaneous QHS  . insulin aspart  9 Units Subcutaneous TID WC  . insulin glargine  22 Units Subcutaneous QHS  . labetalol  200 mg Oral TID  . lisinopril  5 mg Oral Daily  . mirtazapine  15 mg Oral QHS  . nystatin-triamcinolone   Topical BID  . pantoprazole  40 mg Oral QHS  . polyethylene glycol  17 g Oral Daily  . senna  1 tablet Oral BID  . sodium chloride flush  10-40 mL Intracatheter Q12H   Continuous Infusions: . sodium chloride 10 mL/hr at 03/26/17 1738  . levETIRAcetam 500 mg (03/27/17 8119)     LOS: 20 days    Time spent: 30 min    Janece Canterbury, MD [facility] Pager [phone]  If 7PM-7AM, please contact night-coverage [email or web address] Password [email or web address] 03/27/2017, 2:48 PM

## 2017-03-27 NOTE — Progress Notes (Signed)
Patient ID: Denise Morton, female   DOB: 1951/01/15, 67 y.o.   MRN: [id number]  This NP visited patient at the bedside as a follow up to  yesterday's [employer] meeting by my colleague.   I will continue to follow this patient for palliative medicine  needs this week.  Introduced myself and we had conversation regarding her diagnosis, prognosis, GOCs, treatment options and anticipatory care needs.  Complicated hospitalization 2/2, metastatic breast cancer;   CT and MRI for large Right cerebellar met as well as a smaller right occipital met, s/p craniotomy for resection followed by CSF leak.  Patient is slowly improving.  She verbalizes to me this morning her desire to discharge asap and begin rehab.  Patient and her family face advanced directive decisions, treatment option decisions and anticipatory care needs.  Patient and staff unsure about plan for mobility, I spoke with Glenford Peers NP with neurology and Dr Saintclair Halsted to formalize plan for mobility later today.  Discussed with bedside RN.  Patient has o/c of headache, pain, dizziness, visual changes, weakness.  Discussed with patient the importance of continued conversation with family and their  medical providers regarding overall plan of care and treatment options,  ensuring decisions are within the context of the patients values and GOCs.  Questions and concerns addressed   Discussed with Dr  Time in  1020     Time out 1055   Total time spent on the unit was 35 minutes  Greater than 50% of the time was spent in counseling and coordination of care  Wadie Lessen NP  Palliative Medicine Team Team Phone # [phone] Pager [phone]
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
## 2017-03-27 NOTE — Progress Notes (Signed)
NEUROSURGERY PROGRESS NOTE  Doing well. Mild drainage overnight. Drainage at top of site where staples were placed has decreased.    Temp:  [97.6 F (36.4 C)-98.5 F (36.9 C)] 97.6 F (36.4 C) (03/11 0300) Pulse Rate:  [62-77] 67 (03/11 0459) Resp:  [13-18] 13 (03/11 0459) BP: (136-166)/(72-88) 166/78 (03/11 0459) SpO2:  [95 %-96 %] 95 % (03/11 0459) Weight:  [102.7 kg (226 lb 6.6 oz)] 102.7 kg (226 lb 6.6 oz) (03/11 0635)   Denise Chiquito, NP 03/27/2017 8:03 AM

## 2017-03-27 NOTE — Progress Notes (Signed)
0630- patient has been sleeping well all night, no complaints from patient. ABD pad on head/neck saturated with serous fluid, moderate amount on pillowcase. Dressing and pillow case changed. Continuing to monitor.

## 2017-03-27 NOTE — Social Work (Signed)
CSW continuing to follow to support disposition.   CSW aware per attending MD that pt is not medically ready for discharge. Called and formally closed insurance request for SNF with [employer]. Will call back whenever aware that pt is closer medically for discharge.   Alexander Mt, [place] Work [phone]

## 2017-03-28 ENCOUNTER — Encounter (HOSPITAL_COMMUNITY): Payer: Self-pay | Admitting: Certified Registered Nurse Anesthetist

## 2017-03-28 ENCOUNTER — Inpatient Hospital Stay (HOSPITAL_COMMUNITY): Payer: PPO

## 2017-03-28 ENCOUNTER — Encounter (HOSPITAL_COMMUNITY)
Admission: EM | Disposition: A | Discharge status: Skilled Nursing Facility | Payer: Self-pay | Source: Home / Self Care | Attending: Neurosurgery

## 2017-03-28 HISTORY — PX: VENTRICULOSTOMY: SHX5377

## 2017-03-28 HISTORY — PX: APPLICATION OF CRANIAL NAVIGATION: SHX6578

## 2017-03-28 HISTORY — PX: LAPAROSCOPIC REVISION VENTRICULAR-PERITONEAL (V-P) SHUNT: SHX5924

## 2017-03-28 LAB — CBC
HCT: 31 % — ABNORMAL LOW (ref 36.0–46.0)
Hemoglobin: 9.7 g/dL — ABNORMAL LOW (ref 12.0–15.0)
MCH: 31.1 pg (ref 26.0–34.0)
MCHC: 31.3 g/dL (ref 30.0–36.0)
MCV: 99.4 fL (ref 78.0–100.0)
PLATELETS: 141 10*3/uL — AB (ref 150–400)
RBC: 3.12 MIL/uL — AB (ref 3.87–5.11)
RDW: 18.9 % — AB (ref 11.5–15.5)
WBC: 9 10*3/uL (ref 4.0–10.5)

## 2017-03-28 LAB — GLUCOSE, CAPILLARY
GLUCOSE-CAPILLARY: 158 mg/dL — AB (ref 65–99)
GLUCOSE-CAPILLARY: 162 mg/dL — AB (ref 65–99)
Glucose-Capillary: 202 mg/dL — ABNORMAL HIGH (ref 65–99)
Glucose-Capillary: 94 mg/dL (ref 65–99)
Glucose-Capillary: 120 mg/dL — ABNORMAL HIGH (ref 65–99)

## 2017-03-28 LAB — BASIC METABOLIC PANEL
Anion gap: 7 (ref 5–15)
BUN: 27 mg/dL — AB (ref 6–20)
CHLORIDE: 103 mmol/L (ref 101–111)
CO2: 24 mmol/L (ref 22–32)
CREATININE: 0.69 mg/dL (ref 0.44–1.00)
Calcium: 8.6 mg/dL — ABNORMAL LOW (ref 8.9–10.3)
Glucose, Bld: 241 mg/dL — ABNORMAL HIGH (ref 65–99)
Potassium: 4.3 mmol/L (ref 3.5–5.1)
SODIUM: 134 mmol/L — AB (ref 135–145)

## 2017-03-28 SURGERY — VENTRICULOSTOMY
Anesthesia: General | Laterality: Right

## 2017-03-28 MED ORDER — ONDANSETRON HCL 4 MG/2ML IJ SOLN: 4.0000 mg | Freq: Once | INTRAMUSCULAR | Status: DC | PRN

## 2017-03-28 MED ORDER — SUGAMMADEX SODIUM 200 MG/2ML IV SOLN
INTRAVENOUS | Status: AC
  Filled 2017-03-28: qty 2

## 2017-03-28 MED ORDER — FENTANYL CITRATE (PF) 250 MCG/5ML IJ SOLN
INTRAMUSCULAR | Status: AC
  Filled 2017-03-28: qty 5

## 2017-03-28 MED ORDER — ONDANSETRON HCL 4 MG/2ML IJ SOLN
INTRAMUSCULAR | Status: AC
  Filled 2017-03-28: qty 2

## 2017-03-28 MED ORDER — SODIUM CHLORIDE 0.9 % IV SOLN
INTRAVENOUS | Status: DC
  Administered 2017-03-28 – 2017-04-06 (×4): via INTRAVENOUS

## 2017-03-28 MED ORDER — VANCOMYCIN HCL IN DEXTROSE 1-5 GM/200ML-% IV SOLN
1000.0000 mg | Freq: Once | INTRAVENOUS | Status: AC
  Administered 2017-03-28: 1000 mg via INTRAVENOUS

## 2017-03-28 MED ORDER — OXYCODONE HCL 5 MG PO TABS: 5.0000 mg | ORAL_TABLET | Freq: Once | ORAL | Status: DC | PRN

## 2017-03-28 MED ORDER — HEMOSTATIC AGENTS (NO CHARGE) OPTIME
TOPICAL | Status: DC | PRN
  Administered 2017-03-28 (×2): 1 via TOPICAL

## 2017-03-28 MED ORDER — FENTANYL CITRATE (PF) 100 MCG/2ML IJ SOLN: 25.0000 ug | INTRAMUSCULAR | Status: DC | PRN

## 2017-03-28 MED ORDER — THROMBIN 5000 UNITS EX SOLR
CUTANEOUS | Status: AC
  Filled 2017-03-28: qty 15000

## 2017-03-28 MED ORDER — THROMBIN 5000 UNITS EX SOLR
CUTANEOUS | Status: AC
  Filled 2017-03-28: qty 5000

## 2017-03-28 MED ORDER — LABETALOL HCL 5 MG/ML IV SOLN
INTRAVENOUS | Status: AC
  Filled 2017-03-28: qty 4

## 2017-03-28 MED ORDER — INSULIN GLARGINE 100 UNIT/ML ~~LOC~~ SOLN
24.0000 [IU] | Freq: Every day | SUBCUTANEOUS | Status: DC
  Administered 2017-03-28 – 2017-04-13 (×17): 24 [IU] via SUBCUTANEOUS
  Filled 2017-03-28 (×19): qty 0.24

## 2017-03-28 MED ORDER — INSULIN ASPART 100 UNIT/ML ~~LOC~~ SOLN
10.0000 [IU] | Freq: Three times a day (TID) | SUBCUTANEOUS | Status: DC
  Administered 2017-03-29 – 2017-04-14 (×44): 10 [IU] via SUBCUTANEOUS

## 2017-03-28 MED ORDER — MIDAZOLAM HCL 2 MG/2ML IJ SOLN
INTRAMUSCULAR | Status: AC
  Filled 2017-03-28: qty 2

## 2017-03-28 MED ORDER — BACITRACIN ZINC 500 UNIT/GM EX OINT
TOPICAL_OINTMENT | CUTANEOUS | Status: AC
  Filled 2017-03-28: qty 28.35

## 2017-03-28 MED ORDER — DEXAMETHASONE SODIUM PHOSPHATE 10 MG/ML IJ SOLN
INTRAMUSCULAR | Status: AC
  Filled 2017-03-28: qty 1

## 2017-03-28 MED ORDER — VANCOMYCIN HCL IN DEXTROSE 1-5 GM/200ML-% IV SOLN
INTRAVENOUS | Status: AC
  Filled 2017-03-28: qty 200

## 2017-03-28 MED ORDER — EPHEDRINE SULFATE-NACL 50-0.9 MG/10ML-% IV SOSY
PREFILLED_SYRINGE | INTRAVENOUS | Status: DC | PRN
  Administered 2017-03-28: 15 mg via INTRAVENOUS
  Administered 2017-03-28: 20 mg via INTRAVENOUS

## 2017-03-28 MED ORDER — PHENYLEPHRINE 40 MCG/ML (10ML) SYRINGE FOR IV PUSH (FOR BLOOD PRESSURE SUPPORT): PREFILLED_SYRINGE | INTRAVENOUS | Status: DC | PRN

## 2017-03-28 MED ORDER — OXYCODONE HCL 5 MG/5ML PO SOLN: 5.0000 mg | Freq: Once | ORAL | Status: DC | PRN

## 2017-03-28 MED ORDER — MIDAZOLAM HCL 2 MG/2ML IJ SOLN
INTRAMUSCULAR | Status: DC | PRN
  Administered 2017-03-28: 2 mg via INTRAVENOUS

## 2017-03-28 MED ORDER — ROCURONIUM BROMIDE 10 MG/ML (PF) SYRINGE
PREFILLED_SYRINGE | INTRAVENOUS | Status: AC
  Filled 2017-03-28: qty 5

## 2017-03-28 MED ORDER — DEXAMETHASONE SODIUM PHOSPHATE 10 MG/ML IJ SOLN
INTRAMUSCULAR | Status: DC | PRN
  Administered 2017-03-28: 10 mg via INTRAVENOUS

## 2017-03-28 MED ORDER — SUGAMMADEX SODIUM 200 MG/2ML IV SOLN
INTRAVENOUS | Status: DC | PRN
  Administered 2017-03-28: 200 mg via INTRAVENOUS

## 2017-03-28 MED ORDER — VANCOMYCIN HCL IN DEXTROSE 1-5 GM/200ML-% IV SOLN
1000.0000 mg | Freq: Once | INTRAVENOUS | Status: AC
  Administered 2017-03-28: 1000 mg via INTRAVENOUS
  Filled 2017-03-28: qty 200

## 2017-03-28 MED ORDER — ARTIFICIAL TEARS OPHTHALMIC OINT
TOPICAL_OINTMENT | OPHTHALMIC | Status: DC | PRN
  Administered 2017-03-28: 1 via OPHTHALMIC

## 2017-03-28 MED ORDER — DEXTROSE 5 % IV SOLN
INTRAVENOUS | Status: DC | PRN
  Administered 2017-03-28: 30 ug/min via INTRAVENOUS

## 2017-03-28 MED ORDER — LIDOCAINE HCL (CARDIAC) 20 MG/ML IV SOLN
INTRAVENOUS | Status: AC
  Filled 2017-03-28: qty 5

## 2017-03-28 MED ORDER — LIDOCAINE-EPINEPHRINE 1 %-1:100000 IJ SOLN
INTRAMUSCULAR | Status: AC
  Filled 2017-03-28: qty 1

## 2017-03-28 MED ORDER — SODIUM CHLORIDE 0.9 % IV SOLN
INTRAVENOUS | Status: DC | PRN
  Administered 2017-03-28: 16:00:00 via INTRAVENOUS

## 2017-03-28 MED ORDER — GENTAMICIN SULFATE 40 MG/ML IJ SOLN
500.0000 mg | INTRAVENOUS | Status: DC
  Administered 2017-03-28: 500 mg via INTRAVENOUS
  Filled 2017-03-28: qty 12.5

## 2017-03-28 MED ORDER — SODIUM CHLORIDE 0.9 % IR SOLN
Status: DC | PRN
  Administered 2017-03-28: 16:00:00

## 2017-03-28 MED ORDER — PROPOFOL 10 MG/ML IV BOLUS
INTRAVENOUS | Status: AC
  Filled 2017-03-28: qty 20

## 2017-03-28 MED ORDER — THROMBIN 5000 UNITS EX SOLR
CUTANEOUS | Status: DC | PRN
  Administered 2017-03-28 (×2): 5000 [IU] via TOPICAL

## 2017-03-28 MED ORDER — VANCOMYCIN HCL IN DEXTROSE 750-5 MG/150ML-% IV SOLN
750.0000 mg | Freq: Two times a day (BID) | INTRAVENOUS | Status: DC
  Administered 2017-03-29 – 2017-04-11 (×28): 750 mg via INTRAVENOUS
  Filled 2017-03-28 (×34): qty 150

## 2017-03-28 MED ORDER — ROCURONIUM BROMIDE 100 MG/10ML IV SOLN
INTRAVENOUS | Status: DC | PRN
  Administered 2017-03-28 (×2): 40 mg via INTRAVENOUS
  Administered 2017-03-28: 50 mg via INTRAVENOUS

## 2017-03-28 MED ORDER — BACITRACIN ZINC 500 UNIT/GM EX OINT
TOPICAL_OINTMENT | CUTANEOUS | Status: DC | PRN
  Administered 2017-03-28: 1 via TOPICAL

## 2017-03-28 MED ORDER — GLYCOPYRROLATE 0.2 MG/ML IJ SOLN
INTRAMUSCULAR | Status: DC | PRN
  Administered 2017-03-28 (×2): .2 mg via INTRAVENOUS

## 2017-03-28 MED ORDER — LIDOCAINE-EPINEPHRINE 1 %-1:100000 IJ SOLN
INTRAMUSCULAR | Status: DC | PRN
  Administered 2017-03-28: 1 mL

## 2017-03-28 MED ORDER — PROPOFOL 10 MG/ML IV BOLUS
INTRAVENOUS | Status: DC | PRN
  Administered 2017-03-28: 140 mg via INTRAVENOUS

## 2017-03-28 MED ORDER — ARTIFICIAL TEARS OPHTHALMIC OINT
TOPICAL_OINTMENT | OPHTHALMIC | Status: AC
  Filled 2017-03-28: qty 3.5

## 2017-03-28 MED ORDER — ONDANSETRON HCL 4 MG/2ML IJ SOLN
INTRAMUSCULAR | Status: DC | PRN
  Administered 2017-03-28: 4 mg via INTRAVENOUS

## 2017-03-28 MED ORDER — FENTANYL CITRATE (PF) 100 MCG/2ML IJ SOLN
INTRAMUSCULAR | Status: DC | PRN
  Administered 2017-03-28: 50 ug via INTRAVENOUS
  Administered 2017-03-28: 150 ug via INTRAVENOUS
  Administered 2017-03-28: 50 ug via INTRAVENOUS

## 2017-03-28 SURGICAL SUPPLY — 71 items
BAG DECANTER FOR FLEXI CONT (MISCELLANEOUS) ×5 IMPLANT
BENZOIN TINCTURE PRP APPL 2/3 (GAUZE/BANDAGES/DRESSINGS) ×5 IMPLANT
BLADE SURG 11 STRL SS (BLADE) ×5 IMPLANT
BUR ACORN 6.0 PRECISION (BURR) ×4 IMPLANT
BUR ACORN 6.0MM PRECISION (BURR) ×1
CABLE BIPOLOR RESECTION CORD (MISCELLANEOUS) IMPLANT
CANISTER SUCT 3000ML PPV (MISCELLANEOUS) ×5 IMPLANT
CARTRIDGE OIL MAESTRO DRILL (MISCELLANEOUS) ×3 IMPLANT
CATH VENTRIC 35X38 W/TROCAR LG (CATHETERS) ×5 IMPLANT
CLIP RANEY DISP (INSTRUMENTS) ×5 IMPLANT
CLOSURE WOUND 1/2 X4 (GAUZE/BANDAGES/DRESSINGS) ×2
DECANTER SPIKE VIAL GLASS SM (MISCELLANEOUS) ×5 IMPLANT
DERMABOND ADVANCED (GAUZE/BANDAGES/DRESSINGS) ×2
DERMABOND ADVANCED .7 DNX12 (GAUZE/BANDAGES/DRESSINGS) ×3 IMPLANT
DIFFUSER DRILL AIR PNEUMATIC (MISCELLANEOUS) ×5 IMPLANT
DRAPE INCISE IOBAN 85X60 (DRAPES) ×5 IMPLANT
DRAPE ORTHO SPLIT 77X108 STRL (DRAPES) ×4
DRAPE POUCH INSTRU U-SHP 10X18 (DRAPES) ×5 IMPLANT
DRAPE SURG 17X23 STRL (DRAPES) IMPLANT
DRAPE SURG ORHT 6 SPLT 77X108 (DRAPES) ×6 IMPLANT
DRSG OPSITE 4X5.5 SM (GAUZE/BANDAGES/DRESSINGS) ×5 IMPLANT
DRSG OPSITE POSTOP 4X10 (GAUZE/BANDAGES/DRESSINGS) ×5 IMPLANT
DURASEAL APPLICATOR TIP (TIP) ×5 IMPLANT
DURASEAL SPINE SEALANT 3ML (MISCELLANEOUS) ×5 IMPLANT
ELECT REM PT RETURN 9FT ADLT (ELECTROSURGICAL) ×5
ELECTRODE REM PT RTRN 9FT ADLT (ELECTROSURGICAL) ×3 IMPLANT
GAUZE SPONGE 4X4 12PLY STRL (GAUZE/BANDAGES/DRESSINGS) ×5 IMPLANT
GAUZE SPONGE 4X4 16PLY XRAY LF (GAUZE/BANDAGES/DRESSINGS) IMPLANT
GLOVE BIO SURGEON STRL SZ7 (GLOVE) ×5 IMPLANT
GLOVE BIO SURGEON STRL SZ8 (GLOVE) ×5 IMPLANT
GLOVE BIOGEL PI IND STRL 7.0 (GLOVE) ×3 IMPLANT
GLOVE BIOGEL PI INDICATOR 7.0 (GLOVE) ×2
GLOVE EXAM NITRILE LRG STRL (GLOVE) IMPLANT
GLOVE EXAM NITRILE XL STR (GLOVE) IMPLANT
GLOVE EXAM NITRILE XS STR PU (GLOVE) IMPLANT
GLOVE INDICATOR 8.5 STRL (GLOVE) ×5 IMPLANT
GOWN STRL REUS W/ TWL LRG LVL3 (GOWN DISPOSABLE) ×3 IMPLANT
GOWN STRL REUS W/ TWL XL LVL3 (GOWN DISPOSABLE) ×3 IMPLANT
GOWN STRL REUS W/TWL 2XL LVL3 (GOWN DISPOSABLE) ×5 IMPLANT
GOWN STRL REUS W/TWL LRG LVL3 (GOWN DISPOSABLE) ×2
GOWN STRL REUS W/TWL XL LVL3 (GOWN DISPOSABLE) ×2
GUIDEWIRE STRAIGHT .035 260CM (WIRE) ×5 IMPLANT
KIT BASIN OR (CUSTOM PROCEDURE TRAY) ×5 IMPLANT
KIT DRAIN CSF ACCUDRAIN (MISCELLANEOUS) ×5 IMPLANT
KIT ROOM TURNOVER OR (KITS) ×5 IMPLANT
MARKER SPHERE PSV REFLC 13MM (MARKER) ×10 IMPLANT
NEEDLE HYPO 25X1 1.5 SAFETY (NEEDLE) ×5 IMPLANT
NEEDLE SPNL 18GX3.5 QUINCKE PK (NEEDLE) ×5 IMPLANT
NS IRRIG 1000ML POUR BTL (IV SOLUTION) ×5 IMPLANT
OIL CARTRIDGE MAESTRO DRILL (MISCELLANEOUS) ×5
PACK LAMINECTOMY NEURO (CUSTOM PROCEDURE TRAY) ×5 IMPLANT
PAD ARMBOARD 7.5X6 YLW CONV (MISCELLANEOUS) ×10 IMPLANT
PATTIES SURGICAL .5 X3 (DISPOSABLE) IMPLANT
PATTIES SURGICAL .75X.75 (GAUZE/BANDAGES/DRESSINGS) IMPLANT
SHEATH PERITONEAL INTRO 61 (MISCELLANEOUS) IMPLANT
SPONGE LAP 4X18 X RAY DECT (DISPOSABLE) IMPLANT
SPONGE SURGIFOAM ABS GEL SZ50 (HEMOSTASIS) ×5 IMPLANT
STAPLER VISISTAT 35W (STAPLE) ×5 IMPLANT
STRIP CLOSURE SKIN 1/2X4 (GAUZE/BANDAGES/DRESSINGS) ×8 IMPLANT
SUT BONE WAX W31G (SUTURE) IMPLANT
SUT CHROMIC 3 0 PS 2 (SUTURE) IMPLANT
SUT ETHILON 3 0 PS 1 (SUTURE) ×10 IMPLANT
SUT NURALON 4 0 TR CR/8 (SUTURE) ×5 IMPLANT
SUT SILK 0 TIES 10X30 (SUTURE) ×5 IMPLANT
SUT VIC AB 2-0 CT1 18 (SUTURE) ×10 IMPLANT
SUT VICRYL 4-0 PS2 18IN ABS (SUTURE) ×5 IMPLANT
SYR 5ML LL (SYRINGE) IMPLANT
TOWEL GREEN STERILE (TOWEL DISPOSABLE) ×5 IMPLANT
TOWEL GREEN STERILE FF (TOWEL DISPOSABLE) ×5 IMPLANT
UNDERPAD 30X30 (UNDERPADS AND DIAPERS) ×5 IMPLANT
WATER STERILE IRR 1000ML POUR (IV SOLUTION) ×5 IMPLANT

## 2017-03-28 NOTE — Progress Notes (Signed)
Spoke with nursing. Pt will be heading back to OR due to drainage from wound.  Therapy asked to hold at this time.  Will check back after procedure.  Jinger Neighbors, [place] [phone]

## 2017-03-28 NOTE — Progress Notes (Signed)
Whip assessed in arterial line upon admission to the unit.  Will continue to monitor BP closely via cuff as the cuff and a-line are not correlating appropriately.  Guadalupe Maple, RN

## 2017-03-28 NOTE — Anesthesia Procedure Notes (Signed)

## 2017-03-28 NOTE — Progress Notes (Signed)
Patient ID: Denise Morton, female   DOB: 01-15-1951, 67 y.o.   MRN: [id number] Patient overall doing well clinically however continue to have drainage from the upper aspect of the incision. Neurologically remains intact with a normal neurologic exam.  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 Because the appearance on the CT scan I don't think that bedside ventriculostomy is very good idea. I feel that we need to take her to the operating room and do a stereotactic ventriculostomy placement and then do a primary reexploration of her incision and reclosure.

## 2017-03-28 NOTE — Anesthesia Preprocedure Evaluation (Addendum)
Anesthesia Evaluation  Patient identified by MRN, date of birth, ID band Patient awake    Reviewed: Allergy & Precautions, NPO status , Patient's Chart, lab work & pertinent test results  Airway Mallampati: II  TM Distance: >3 FB Neck ROM: Full    Dental  (+) Teeth Intact, Dental Advisory Given   Pulmonary former smoker,  Lung mets   breath sounds clear to auscultation       Cardiovascular hypertension, Pt. on medications + Peripheral Vascular Disease   Rhythm:Regular Rate:Normal     Neuro/Psych Peripheral neuropathy; Brain mets s/p suboccipital crani with persistent CSF leak negative psych ROS   GI/Hepatic negative GI ROS, Liver mets   Endo/Other  diabetes, Type 2, Insulin DependentObesity  Renal/GU negative Renal ROS  negative genitourinary   Musculoskeletal Bone mets   Abdominal   Peds  Hematology  (+) anemia , Mild thrombocytopenia   Anesthesia Other Findings Breast cancer with mets to bone/liver/lung/brain  Reproductive/Obstetrics                            Anesthesia Physical  Anesthesia Plan  ASA: III  Anesthesia Plan: General   Post-op Pain Management:    Induction: Intravenous  PONV Risk Score and Plan: 3 and Ondansetron, Treatment may vary due to age or medical condition and Dexamethasone  Airway Management Planned: Oral ETT  Additional Equipment: Arterial line  Intra-op Plan:   Post-operative Plan: Extubation in OR  Informed Consent: I have reviewed the patients History and Physical, chart, labs and discussed the procedure including the risks, benefits and alternatives for the proposed anesthesia with the patient or authorized representative who has indicated his/her understanding and acceptance.   Dental advisory given  Plan Discussed with: CRNA  Anesthesia Plan Comments:         Anesthesia Quick Evaluation

## 2017-03-28 NOTE — Op Note (Signed)
Preoperative diagnosis: Recurrent CSF leak following suboccipital craniectomy for resection of cerebellar metastases   Postoperative diagnosis: Same  Procedure: #1 stereotactic placement of right frontal ventricular catheter for external ventricular drainage utilizing the BrainLab stereotactic navigation system  #2 reexploration of suboccipital craniectomy incision. Primary repair of CSF leak with sewing a myofascial patch graft  Surgeon: Dominica Severin Montray Kliebert  Asst.: Glenford Peers  Anesthesia: Gen.  EBL: Minimal  History of present illness: 67 year old female who underwent a subacute subdural craniectomy for resection of cerebellar metastases 2 weeks ago had a postoperative CSF leak out of her incision that was refractory to oversewing was taken back last week. Primary repair with DuraMatrix patch graft. This worked only for a few days and she started leaking out her incision again. CT scan showed subdural hygromas CSF fluid collections as well as small ventricles. Due to her persistent CSF leak after secondary repair I recommended placement of a ventricular catheter and reexploration for repair of recurrent CSF leak. Due to her small radicular size I recommended stereotactic placement of the ventricular catheter. Extensively went over the risks and benefits of the procedures to the patient and her son including periodic course expectations of outcome and alternatives of surgery and she understood and agreed to proceed forward.  Operative procedure: Patient brought into the or was induced on general anesthesia positioned supine the head was locked in the [place] pins looking straight up and the neck slightly flexed adequate registration was achieved with the BrainLab stereotactic navigation system then cut Gerald Stabs point was shaved prepped and draped in routine sterile fashion a small bur hole was drilled with the [facility] Max drill bit the dura was pierced with a spinal needle and then utilizing the  stereotactic stylette a ventricular catheter was plastered into the right lateral ventricle on the first pass. Brisk CSF flow under pressure was achieved. This was then tunneled under the skin and tied off. Patient was then removed from the bed and repositioned prone for the expiration of the suboccipital incision. The old incision was opened up after prepping and draping . axial was identified and aspirated looking over the suture line of the previous patch graft there was an area where I sewn a small fascial patch that is eroded through and it was leaking briskly CSF out of this area. This was in the bottom right hand side of the suture line so I placed 4 Neurolons in a clockwise fashion and sewed a large piece of myofascial patch day harvested from the previous incision and anchored in place. Valsalva showed no active spinal fluid leak after this. I then utilized the blue fibrin glue all around the suture line and Gelfoam and reapproximated the incision with interrupted Vicryl's and a interrupted vertical mattress suture. Wounds and dressed patient recovered in stable condition. At the end of case all needle counts sponge counts were correct.

## 2017-03-28 NOTE — Transfer of Care (Signed)
Immediate Anesthesia Transfer of Care Note  Patient: Denise Morton  Procedure(s) Performed: Re-exploration of Suboccipital Wound and (Right ) APPLICATION OF CRANIAL NAVIGATION (N/A ) Placement of Ventriculostomy  Patient Location: PACU  Anesthesia Type:General  Level of Consciousness: awake, alert , oriented, drowsy, patient cooperative and responds to stimulation  Airway & Oxygen Therapy: Patient Spontanous Breathing and Patient connected to nasal cannula oxygen  Post-op Assessment: Report given to RN, Post -op Vital signs reviewed and stable and Patient moving all extremities X 4  Post vital signs: Reviewed and stable  Last Vitals:  Vitals:   03/28/17 1927 03/28/17 1930  BP:    Pulse: 82 80  Resp: 18 10  Temp: (!) (P) 36.2 C   SpO2: 100% 100%    Last Pain:  Vitals:   03/28/17 0800  TempSrc:   PainSc: 0-No pain      Patients Stated Pain Goal: 0 (16/10/96 0454)  Complications: No apparent anesthesia complications

## 2017-03-28 NOTE — Progress Notes (Signed)
Pharmacy Antibiotic Note  Denise Morton is a 67 y.o. female admitted on 03/07/2017 with surgical prophylaxis.  Pharmacy has been consulted for vancomycin/gentamicin dosing. PT is s/p ventriculostomy. Pt received vancomycin 1000 mg intra-op.  Plan: Vancomycin 1000 mg x 1 (to complete 2 gram load), followed by  Vancomycin 750 mg IV every 12 hours.  Goal trough 15-20 mcg/mL.  Gentamicin 7 mg/kg (500 mg) q 24 hours (based on adjusted body weight) 10 hour gentamicin level Monitor clinical progress, cultures/sensitivities, renal function, abx plan Vancomycin trough as indicated   Height: 5\' 6"  (167.6 cm) Weight: 227 lb 15.3 oz (103.4 kg) IBW/kg (Calculated) : 59.3  Temp (24hrs), Avg:97.6 F (36.4 C), Min:97.2 F (36.2 C), Max:97.8 F (36.6 C)  Recent Labs  Lab 03/24/17 0426 03/25/17 0637 03/26/17 0515 03/27/17 0500 03/28/17 0349  WBC 9.3 7.6 7.4 8.5 9.0  CREATININE 0.86 0.71 0.75 0.61 0.69    Estimated Creatinine Clearance: 84 mL/min (by C-G formula based on SCr of 0.69 mg/dL).    Allergies  Allergen Reactions  . Penicillin G Rash    Has patient had a PCN reaction causing immediate rash, facial/tongue/throat swelling, SOB or lightheadedness with hypotension: Unknown Has patient had a PCN reaction causing severe rash involving mucus membranes or skin necrosis: Unknown Has patient had a PCN reaction that required hospitalization: Unknown Has patient had a PCN reaction occurring within the last 10 years: Unknown If all of the above answers are "NO", then may proceed with Cephalosporin use.   . Sulfa Antibiotics Rash    Antimicrobials this admission: 3/12 vancomycin >>  3/12 gentamicin >>   Dose adjustments this admission:    Thank you for allowing [place] to participate in this patients care.  Jens Som, PharmD Clinical phone for 03/28/2017 from 3:30-10:30p: [phone] If after 10:30p, please call main pharmacy at: [phone] 03/28/2017 8:23 PM

## 2017-03-28 NOTE — Progress Notes (Signed)
PROGRESS NOTE  Denise Morton  YQI:[id number] DOB: 30-Jul-1950 DOA: 03/07/2017 PCP: Denise Carol, MD  Brief Narrative:  67 y.o.femaleWith pmhx of BRCA, DM, admitted on 03/07/17 with weakness, dizziness, anorexia, speech difficulties and near syncope. MRI brain demonstrated metastatic lesions to the cerebellum and right occipital areas. She was initially admitted to [facility].  Neurosurgery was consulted and patient was transferred to [facility] on 2/23. She underwent suboccipital craniectomy for resection of right cerebellar meton 2/25 by Dr. Saintclair Halsted.  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  Symptomatically she is feeling much better with improved strength, speech, appetite.  Returned to the OR on 3/6 for correction of her CSF leak underwent reexploration of the suboccipital incision for repair of primary CSF leak utilizing DuraMatrix patch with fascial patch grafts.  She has continued to have a slow CSF leak.  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  Dr. Saintclair Halsted planning to do a stereotactic ventriculostomy placement and primary reexploration of her incision and reclosure on 3/12.    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  -  Continue Keppra  -  Decadron per surgery -  Doxycycline per surgery -  Appreciate Palliative care consultation  2]Type 2 diabetes, blood sugars slightly elevated but NPO today for surgery - increase Lantus to 24 units - Continue aspart with meals to 10 units 3 times daily - Continue high dose sliding scale insulin  3]hypertension, blood pressure gradually trending down -  Continue Norvasc -  continue labetalol -   Continue lisinopril  -  Continue labetalol and hydralazine prn  4]anemia of chronic disease, hemoglobin improving -   Occult stool -  Continue iron supplementation (do not see that testing was performed)  Leukocytosis, resolved.   DVT prophylaxis:  SCDs  Code Status: Full code Family Communication:  No family at bedside today Disposition Plan:  Ongoing management of CSF leak by neurosurgery.     Consultants:   Neurosurgery  XRT oncology  PM&R  Procedures:  03/13/2017 suboccipital craniectomy for resection of right cerebellar met  03/22/2017:  Wound revision due to CSF leak 03/28/2017:  Stereotactic ventriculostomy placement and primary reexploration of her incision and reclosure on 3/12  Antimicrobials:  Anti-infectives (From admission, onward)   Start     Dose/Rate Route Frequency Ordered Stop   03/23/17 1200  doxycycline (VIBRA-TABS) tablet 100 mg     100 mg Oral Every 12 hours 03/23/17 0954     03/22/17 2030  vancomycin (VANCOCIN) IVPB 1000 mg/200 mL premix     1,000 mg 200 mL/hr over 60 Minutes Intravenous Every 12 hours 03/22/17 1912 03/23/17 0939   03/22/17 1428  bacitracin 50,000 Units in sodium chloride irrigation 0.9 % 500 mL irrigation  Status:  Discontinued       As needed 03/22/17 1428 03/22/17 1550   03/20/17 1100  doxycycline (VIBRA-TABS) tablet 100 mg  Status:  Discontinued     100 mg Oral Every 12 hours 03/20/17 1011 03/23/17 0954   03/20/17 0000  doxycycline (VIBRA-TABS) 100 MG tablet     100 mg Oral Every 12 hours 03/20/17 1245     03/13/17 1930  vancomycin (VANCOCIN) IVPB 1000 mg/200 mL premix     1,000 mg 200  mL/hr over 60 Minutes Intravenous Every 12 hours 03/13/17 1836 03/14/17 0756   03/13/17 0858  bacitracin 50,000 Units in sodium chloride irrigation 0.9 % 500 mL irrigation  Status:  Discontinued       As needed 03/13/17 0858 03/13/17 1604   03/13/17 0730  vancomycin (VANCOCIN) IVPB 1000 mg/200 mL premix  Status:  Discontinued     1,000 mg 200  mL/hr over 60 Minutes Intravenous To Surgery 03/13/17 0729 03/13/17 1810       Subjective:  Denies nausea, vomiting, headache.  She continues to have some leaking from her posterior scalp incision.  She is trying to feel hopeful now that there is a plan to try to correct the problem again.  Objective: Vitals:   03/28/17 0700 03/28/17 0800 03/28/17 1050 03/28/17 1200  BP:  (!) 157/96 113/78 118/83  Pulse:  73 67 75  Resp:  _0 Temp: 97.8 F (36.6 C)     TempSrc:      SpO2:  97% 97% 93%  Weight:      Height:        Intake/Output Summary (Last 24 hours) at 03/28/2017 1513 Last data filed at 03/28/2017 1210 Gross per 24 hour  Intake 461.7 ml  Output 800 ml  Net -338.3 ml   Filed Weights   03/26/17 0500 03/27/17 0635 03/28/17 0500  Weight: 104 kg (229 lb 4.5 oz) 102.7 kg (226 lb 6.6 oz) 103.4 kg (227 lb 15.3 oz)    Examination:   General exam:  Adult female.  No acute distress.  HEENT:  MMM, posterior scalp craniocaudal incision in the midline with staples.  Edges well approximated, no induration or erythema.  She continues to have serous fluid leaking from the inferior portion of her incision with a saturated dressing at the inferior portion. Respiratory system: Clear to auscultation bilaterally Cardiovascular system: Regular rate and rhythm, normal S1/S2. No murmurs, rubs, gallops or clicks.  Warm extremities Gastrointestinal system: Normal active bowel sounds, soft, nondistended, nontender. MSK:  Normal tone and bulk, no lower extremity edema Neuro:  Grossly intact   Data Reviewed: I have personally reviewed following labs and imaging studies  CBC: Recent Labs  Lab 03/22/17 0929  03/24/17 0426 03/25/17 0637 03/26/17 0515 03/27/17 0500 03/28/17 0349  WBC 14.3*   < > 9.3 7.6 7.4 8.5 9.0  NEUTROABS 13.0*  --   --   --   --   --   --   HGB 9.4*   < > 9.2* 9.7* 9.9* 10.3* 9.7*  HCT 29.9*   < > 28.7* 29.4* 30.6* 31.1* 31.0*  MCV 97.4   < > 100.3* 98.7 99.7  99.0 99.4  PLT 205   < > 161 165 165 145* 141*   < > = values in this interval not displayed.   Basic Metabolic Panel: Recent Labs  Lab 03/24/17 0426 03/25/17 0637 03/26/17 0515 03/27/17 0500 03/28/17 0349  NA 137 138 138 139 134*  K 4.2 4.6 4.4 4.2 4.3  CL 104 102 105 105 103  CO2 _1 GLUCOSE 144* 221* 220* 146* 241*  BUN 32* 31* 30* 28* 27*  CREATININE 0.86 0.71 0.75 0.61 0.69  CALCIUM 8.6* 8.9 8.8* 8.9 8.6*   GFR: Estimated Creatinine Clearance: 84 mL/min (by C-G formula based on SCr of 0.69 mg/dL). Liver Function Tests: No results for input(s): AST, ALT, ALKPHOS, BILITOT, PROT, ALBUMIN in the last 168 hours. No results for input(s): LIPASE, AMYLASE  in the last 168 hours. No results for input(s): AMMONIA in the last 168 hours. Coagulation Profile: No results for input(s): INR, PROTIME in the last 168 hours. Cardiac Enzymes: No results for input(s): CKTOTAL, CKMB, CKMBINDEX, TROPONINI in the last 168 hours. BNP (last 3 results) No results for input(s): PROBNP in the last 8760 hours. HbA1C: No results for input(s): HGBA1C in the last 72 hours. CBG: Recent Labs  Lab 03/27/17 1655 03/27/17 2110 03/27/17 2132 03/28/17 0720 03/28/17 1103  GLUCAP 103* 249* 241* 158* 202*   Lipid Profile: No results for input(s): CHOL, HDL, LDLCALC, TRIG, CHOLHDL, LDLDIRECT in the last 72 hours. Thyroid Function Tests: No results for input(s): TSH, T4TOTAL, FREET4, T3FREE, THYROIDAB in the last 72 hours. Anemia Panel: No results for input(s): VITAMINB12, FOLATE, FERRITIN, TIBC, IRON, RETICCTPCT in the last 72 hours. Urine analysis: No results found for: COLORURINE, APPEARANCEUR, LABSPEC, PHURINE, GLUCOSEU, HGBUR, BILIRUBINUR, KETONESUR, PROTEINUR, UROBILINOGEN, NITRITE, LEUKOCYTESUR Sepsis Labs: _0 (procalcitonin:4,lacticidven:4)  )No results found for this or any previous visit (from the past 240 hour(s)).    Radiology Studies: Ct Head Wo Contrast  Result  Date: 03/27/2017 CLINICAL DATA:  Headache, suspect CSF leak. Status post craniotomy for debulking of cerebellar metastasis. EXAM: CT HEAD WITHOUT CONTRAST TECHNIQUE: Contiguous axial images were obtained from the base of the skull through the vertex without intravenous contrast. COMPARISON:  CT HEAD March 07, 2017 and MRI of the head March 07, 2017 and March 14, 2017 FINDINGS: BRAIN: Status post debulking of partially calcified RIGHT cerebellar mass with small amount of residual calcifications, mild mass effect, improved from prior CT. Trace falcotentorial heterogeneous collection. Prominent frontoparietal extra-axial spaces measuring to 5 mm. No intraparenchymal hemorrhage or acute large vascular territory infarct. Resolution of hydrocephalus, slit-like third ventricle. Patchy supratentorial white matter hypodensities most compatible with small vessel ischemic disease. Basal cisterns maintained. New subcentimeter foci of fat density prepontine cistern, suprasellar cerebellar cistern. VASCULAR: Moderate calcific atherosclerosis of the carotid siphons. SKULL: Interval suboccipital craniectomy. Small amount of subcutaneous gas craniocervical thecal sac. 3.6 x 5.3 x 7 cm (caudal aspect incompletely imaged) suboccipital fluid collections with marginal densities, probable calcifications. Overlying skin staples. No significant scalp soft tissue swelling. SINUSES/ORBITS: The mastoid air-cells and included paranasal sinuses are well-aerated.The included ocular globes and orbital contents are non-suspicious. OTHER: None. IMPRESSION: 1. Status post suboccipital craniectomy for debulking of cerebellar metastasis with decreased mass effect, overall improved appearance and resolution of hydrocephalus. 2. Newly prominent frontoparietal and trans falcine fluid collections measuring to 4 mm. 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 Constellation of findings most compatible with intracranial  hypotension and CSF leak. Minimal thecal sac gas. Electronically Signed   By: Elon Alas M.D.   On: 03/27/2017 19:04     Scheduled Meds: . amLODipine  10 mg Oral Daily  . chlorhexidine  15 mL Mouth Rinse BID  . Chlorhexidine Gluconate Cloth  6 each Topical Q0600  . dexamethasone  4 mg Intravenous Q6H  . docusate sodium  100 mg Oral BID  . doxycycline  100 mg Oral Q12H  . feeding supplement (GLUCERNA SHAKE)  237 mL Oral BID BM  . ferrous sulfate  325 mg Oral BID WC  . insulin aspart  0-20 Units Subcutaneous TID WC  . insulin aspart  0-5 Units Subcutaneous QHS  . insulin aspart  9 Units Subcutaneous TID WC  . insulin glargine  22 Units Subcutaneous QHS  . labetalol  200 mg Oral TID  . lisinopril  5 mg Oral  Daily  . mirtazapine  15 mg Oral QHS  . nystatin-triamcinolone   Topical BID  . pantoprazole  40 mg Oral QHS  . polyethylene glycol  17 g Oral Daily  . senna  1 tablet Oral BID  . sodium chloride flush  10-40 mL Intracatheter Q12H   Continuous Infusions: . sodium chloride 10 mL/hr at 03/27/17 1800  . levETIRAcetam 500 mg (03/28/17 0620)     LOS: 21 days    Time spent: 30 min    Janece Canterbury, MD [facility] Pager [phone]  If 7PM-7AM, please contact night-coverage [email or web address] Password [facility] 03/28/2017, 3:13 PM

## 2017-03-28 NOTE — Plan of Care (Signed)
  Progressing Education: Knowledge of General Education information will improve 03/28/2017 1235 - Progressing by Valarie Farace, Vertell Limber, RN Health Behavior/Discharge Planning: Ability to manage health-related needs will improve 03/28/2017 1235 - Progressing by Jayziah Bankhead, Vertell Limber, RN Clinical Measurements: Ability to maintain clinical measurements within normal limits will improve 03/28/2017 1235 - Progressing by Genesis Novosad, Vertell Limber, RN Will remain free from infection 03/28/2017 1235 - Progressing by Evalee Jefferson, RN Diagnostic test results will improve 03/28/2017 1235 - Progressing by Evalee Jefferson, RN Respiratory complications will improve 03/28/2017 1235 - Progressing by Evalee Jefferson, RN Cardiovascular complication will be avoided 03/28/2017 1235 - Progressing by Evalee Jefferson, RN Activity: Risk for activity intolerance will decrease 03/28/2017 1235 - Progressing by Evalee Jefferson, RN Nutrition: Adequate nutrition will be maintained 03/28/2017 1235 - Progressing by Evalee Jefferson, RN Coping: Level of anxiety will decrease 03/28/2017 1235 - Progressing by Evalee Jefferson, RN Elimination: Will not experience complications related to bowel motility 03/28/2017 1235 - Progressing by Evalee Jefferson, RN Will not experience complications related to urinary retention 03/28/2017 1235 - Progressing by Odes Lolli, Vertell Limber, RN Pain Managment: General experience of comfort will improve 03/28/2017 1235 - Progressing by Evalee Jefferson, RN Safety: Ability to remain free from injury will improve 03/28/2017 1235 - Progressing by Evalee Jefferson, RN Skin Integrity: Risk for impaired skin integrity will decrease 03/28/2017 1235 - Progressing by Rehema Muffley, Vertell Limber, RN

## 2017-03-28 NOTE — Progress Notes (Signed)
Nutrition Follow-up  DOCUMENTATION CODES:   Obesity unspecified  INTERVENTION:  Once diet advances, continue Glucerna Shake po BID, each supplement provides 220 kcal and 10 grams of protein.  NUTRITION DIAGNOSIS:   Increased nutrient needs related to chronic illness, cancer and cancer related treatments as evidenced by estimated needs; ongoing  GOAL:   Patient will meet greater than or equal to 90% of their needs; met  MONITOR:   PO intake, Labs, Supplement acceptance, Weight trends, I & O's  REASON FOR ASSESSMENT:   Malnutrition Screening Tool    ASSESSMENT:   Pt with PMH of metastatic breast cancer, gastritis, and DM presents with weakness s/p syncope 2/25 s/p suboccipital craniotomy and resection of the right cerebellar mass   Pt undergoing stereotactic ventriculostomy placement and primary re-exploration of her incision and re-closure today. Meal completion prior to current NPO was mostly 50-100%. Pt additionally has Glucerna shake ordered and has been consuming them. Once diet advances, recommend continuation of Glucerna to aid in caloric and protein needs.   Labs and medications reviewed.    Diet Order:  Diet - low sodium heart healthy Diet NPO time specified  EDUCATION NEEDS:   Education needs have been addressed  Skin:  Skin Assessment: Reviewed RN Assessment  Last BM:  3/11  Height:   Ht Readings from Last 1 Encounters:  03/22/17 5' 6"  (1.676 m)    Weight:   Wt Readings from Last 1 Encounters:  03/28/17 227 lb 15.3 oz (103.4 kg)    Ideal Body Weight:  61.4 kg  BMI:  Body mass index is 36.79 kg/m.  Estimated Nutritional Needs:   Kcal:  2020-2263  Protein:  110-120 grams  Fluid:  >/= 2 L/d    Corrin Parker, MS, RD, LDN Pager # [phone] After hours/ weekend pager # [phone]

## 2017-03-28 NOTE — Anesthesia Postprocedure Evaluation (Signed)
Anesthesia Post Note  Patient: Desaray Duerksen  Procedure(s) Performed: Re-exploration of Suboccipital Wound and (Right ) APPLICATION OF CRANIAL NAVIGATION (N/A ) Placement of Ventriculostomy     Patient location during evaluation: PACU Anesthesia Type: General Level of consciousness: awake Pain management: pain level controlled Vital Signs Assessment: post-procedure vital signs reviewed and stable Respiratory status: spontaneous breathing Cardiovascular status: stable Anesthetic complications: no    Last Vitals:  Vitals:   03/28/17 1945 03/28/17 2000  BP: (!) 145/85 140/88  Pulse: 71 64  Resp: 12 14  Temp:    SpO2: 100% 100%    Last Pain:  Vitals:   03/28/17 0800  TempSrc:   PainSc: 0-No pain    LLE Motor Response: Purposeful movement (03/28/17 2000)   RLE Motor Response: Purposeful movement (03/28/17 2000)        Aimar Borghi

## 2017-03-28 NOTE — Anesthesia Procedure Notes (Signed)
Arterial Line Insertion Start/End3/12/2017 4:15 PM, 03/28/2017 4:30 PM Performed by: Myna Bright, CRNA, CRNA  Patient location: OR. Preanesthetic checklist: patient identified, IV checked, site marked, risks and benefits discussed, surgical consent, monitors and equipment checked, pre-op evaluation, timeout performed and anesthesia consent Patient sedated Right, radial was placed Catheter size: 20 G Hand hygiene performed  and maximum sterile barriers used  Allen's test indicative of satisfactory collateral circulation Attempts: 2 (first attempt unsuccessful right side) Procedure performed without using ultrasound guided technique. Following insertion, Biopatch and dressing applied. Post procedure assessment: normal

## 2017-03-29 ENCOUNTER — Encounter (HOSPITAL_COMMUNITY): Payer: Self-pay | Admitting: Neurosurgery

## 2017-03-29 LAB — GLUCOSE, CAPILLARY
GLUCOSE-CAPILLARY: 183 mg/dL — AB (ref 65–99)
GLUCOSE-CAPILLARY: 265 mg/dL — AB (ref 65–99)
GLUCOSE-CAPILLARY: 174 mg/dL — AB (ref 65–99)
Glucose-Capillary: 199 mg/dL — ABNORMAL HIGH (ref 65–99)

## 2017-03-29 LAB — CBC
HEMATOCRIT: 30.4 % — AB (ref 36.0–46.0)
Hemoglobin: 9.9 g/dL — ABNORMAL LOW (ref 12.0–15.0)
MCH: 32.6 pg (ref 26.0–34.0)
MCHC: 32.6 g/dL (ref 30.0–36.0)
MCV: 100 fL (ref 78.0–100.0)
PLATELETS: 127 10*3/uL — AB (ref 150–400)
RBC: 3.04 MIL/uL — ABNORMAL LOW (ref 3.87–5.11)
RDW: 19.4 % — AB (ref 11.5–15.5)
WBC: 9.3 10*3/uL (ref 4.0–10.5)

## 2017-03-29 LAB — BASIC METABOLIC PANEL
Anion gap: 9 (ref 5–15)
BUN: 21 mg/dL — ABNORMAL HIGH (ref 6–20)
CALCIUM: 8.4 mg/dL — AB (ref 8.9–10.3)
CO2: 23 mmol/L (ref 22–32)
CREATININE: 0.6 mg/dL (ref 0.44–1.00)
Chloride: 105 mmol/L (ref 101–111)
GFR calc Af Amer: >60 mL/min (ref >60)
Glucose, Bld: 178 mg/dL — ABNORMAL HIGH (ref 65–99)
POTASSIUM: 5 mmol/L (ref 3.5–5.1)
SODIUM: 137 mmol/L (ref 135–145)

## 2017-03-29 LAB — GENTAMICIN LEVEL, RANDOM: GENTAMICIN RM: 5.4 ug/mL

## 2017-03-29 MED ORDER — GENTAMICIN SULFATE 40 MG/ML IJ SOLN
500.0000 mg | INTRAVENOUS | Status: DC
  Administered 2017-03-30 – 2017-04-12 (×10): 500 mg via INTRAVENOUS
  Filled 2017-03-29 (×10): qty 12.5

## 2017-03-29 NOTE — Plan of Care (Signed)
  Skin Integrity: Risk for impaired skin integrity will decrease 03/29/2017 2338 - Not Progressing by Omelia Blackwater, Student-RN Patient previously not compliant with Q2 hour turns - Provided education on necessity of turning every 2 hours. Patient now agreeing to complete turns as required.

## 2017-03-29 NOTE — Progress Notes (Signed)
Neurosurgeon assessed pt at the bedside, informed RN that he wants ventriculostomy drain to have an output of about 5-10cc/hr as opposed to a set cm H2O, etc. Given orders to increase and decrease level of drain as needed to reach goal of 5-10cc/hr.  Will continue to monitor.  Guadalupe Maple, RN

## 2017-03-29 NOTE — Progress Notes (Signed)
Subjective: Patient reports Patient feels much better minimal headache  Objective: Vital signs in last 24 hours: Temp:  [97.2 F (36.2 C)-98.3 F (36.8 C)] 98.3 F (36.8 C) (03/13 0400) Pulse Rate:  [47-82] 47 (03/13 0400) Resp:  [6-22] 12 (03/13 0400) BP: (88-160)/(63-96) 101/67 (03/13 0400) SpO2:  [93 %-100 %] 98 % (03/13 0400) Arterial Line BP: (121-190)/(55-84) 121/55 (03/13 0400) Weight:  [102.4 kg (225 lb 12 oz)] 102.4 kg (225 lb 12 oz) (03/13 0500)  Intake/Output from previous day: 03/12 0701 - 03/13 0700 In: 2649.2 [P.O.:338.7; I.V.:1473; IV Piggyback:712.5] Out: 2150 [Urine:2075; Blood:75] Intake/Output this shift: Total I/O In: 1207.5 [I.V.:370; Other:125; IV Piggyback:712.5] Out: 8756 [Urine:1425]  Awake alert oriented dressing dry external ventricular drain seems to be functioning well.  Lab Results: Recent Labs    03/28/17 0349 03/29/17 0428  WBC 9.0 9.3  HGB 9.7* 9.9*  HCT 31.0* 30.4*  PLT 141* 127*   BMET Recent Labs    03/28/17 0349 03/29/17 0428  NA 134* 137  K 4.3 5.0  CL 103 105  CO2 24 23  GLUCOSE 241* 178*  BUN 27* 21*  CREATININE 0.69 0.60  CALCIUM 8.6* 8.4*    Studies/Results: Ct Head Wo Contrast  Result Date: 03/27/2017 CLINICAL DATA:  Headache, suspect CSF leak. Status post craniotomy for debulking of cerebellar metastasis. EXAM: CT HEAD WITHOUT CONTRAST TECHNIQUE: Contiguous axial images were obtained from the base of the skull through the vertex without intravenous contrast. COMPARISON:  CT HEAD March 07, 2017 and MRI of the head March 07, 2017 and March 14, 2017 FINDINGS: BRAIN: Status post debulking of partially calcified RIGHT cerebellar mass with small amount of residual calcifications, mild mass effect, improved from prior CT. Trace falcotentorial heterogeneous collection. Prominent frontoparietal extra-axial spaces measuring to 5 mm. No intraparenchymal hemorrhage or acute large vascular territory infarct. Resolution of  hydrocephalus, slit-like third ventricle. Patchy supratentorial white matter hypodensities most compatible with small vessel ischemic disease. Basal cisterns maintained. New subcentimeter foci of fat density prepontine cistern, suprasellar cerebellar cistern. VASCULAR: Moderate calcific atherosclerosis of the carotid siphons. SKULL: Interval suboccipital craniectomy. Small amount of subcutaneous gas craniocervical thecal sac. 3.6 x 5.3 x 7 cm (caudal aspect incompletely imaged) suboccipital fluid collections with marginal densities, probable calcifications. Overlying skin staples. No significant scalp soft tissue swelling. SINUSES/ORBITS: The mastoid air-cells and included paranasal sinuses are well-aerated.The included ocular globes and orbital contents are non-suspicious. OTHER: None. IMPRESSION: 1. Status post suboccipital craniectomy for debulking of cerebellar metastasis with decreased mass effect, overall improved appearance and resolution of hydrocephalus. 2. Newly prominent frontoparietal and trans falcine fluid collections measuring to 4 mm. 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 Constellation of findings most compatible with intracranial hypotension and CSF leak. Minimal thecal sac gas. Electronically Signed   By: Elon Alas M.D.   On: 03/27/2017 19:04    Assessment/Plan: Postop day 1 from reexploration and and re-repair of primary CSF leak with placement of a right frontal external ventricular drain. Unlike ventriculostomies placed to control and coronal pressure this one isn't and placed to help decrease pressure to allow a CSF leak to heal so therefore I feel like we need to control volume primarily and monitor pressure. So I would like to try to maintain 5-10 mL of drainage every hour which may result in lowering the level of the trial between 10 and 5 cm water to maintain adequate drainage. I do think the patient is can have a relatively low pressure system in  the  supratentorial space secondary to her posterior fossa CSF leak and surgery. We also need to watch for over drainage is a patient artery had evidence of subdural hygromas. Continue IV antibiotics as long as he external ventricular drain is in place. We'll plan on checking surveillance CSF cultures in the morning.  LOS: 22 days     Michalla Ringer P 03/29/2017, 6:22 AM

## 2017-03-29 NOTE — Progress Notes (Signed)
PROGRESS NOTE  Denise Morton  FXO:[id number] DOB: 05/17/1950 DOA: 03/07/2017 PCP: Seward Carol, MD  Brief Narrative:  67 y.o.femaleWith pmhx of BRCA, DM, admitted on 03/07/17 with weakness, dizziness, anorexia, speech difficulties and near syncope. MRI brain demonstrated metastatic lesions to the cerebellum and right occipital areas. She was initially admitted to [facility].  Neurosurgery was consulted and patient was transferred to [facility] on 2/23. She underwent suboccipital craniectomy for resection of right cerebellar meton 2/25 by Dr. Saintclair Halsted.  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  Symptomatically she is feeling much better with improved strength, speech, appetite.  Returned to the OR on 3/6 for correction of her CSF leak underwent reexploration of the suboccipital incision for repair of primary CSF leak utilizing DuraMatrix patch with fascial patch grafts.  She has continued to have a slow CSF leak.  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  Dr. Saintclair Halsted planning to do a stereotactic ventriculostomy placement and primary reexploration of her incision and reclosure on 3/12.    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  -  Appreciate Palliative care consultation - Management per primary  2]Type 2 diabetes, blood sugars slightly elevated but NPO today for surgery - Lantus increased to 24 units - Continue aspart with meals to 10 units 3 times daily - Continue high dose sliding scale insulin  3]hypertension, blood pressure gradually trending down -  Continue Norvasc -  continue labetalol -  Continue lisinopril  -  Continue labetalol and  hydralazine prn  4]anemia of chronic disease, hemoglobin improving -   Occult stool -  Continue iron supplementation (do not see that testing was performed)  Leukocytosis, resolved.   DVT prophylaxis:  SCDs  Code Status: Full code Family Communication:  No family at bedside today Disposition Plan:  Ongoing management of CSF leak by neurosurgery.     Consultants:   Neurosurgery  XRT oncology  PM&R  Procedures:  03/13/2017 suboccipital craniectomy for resection of right cerebellar met  03/22/2017:  Wound revision due to CSF leak 03/28/2017:  Stereotactic ventriculostomy placement and primary reexploration of her incision and reclosure on 3/12  Antimicrobials:  Anti-infectives (From admission, onward)   Start     Dose/Rate Route Frequency Ordered Stop   03/30/17 1000  gentamicin (GARAMYCIN) 500 mg in dextrose 5 % 100 mL IVPB     500 mg 112.5 mL/hr over 60 Minutes Intravenous Every 36 hours 03/29/17 1317     03/29/17 0700  vancomycin (VANCOCIN) IVPB 750 mg/150 ml premix     750 mg 150 mL/hr over 60 Minutes Intravenous Every 12 hours 03/28/17 2003     03/28/17 2030  gentamicin (GARAMYCIN) 500 mg in dextrose 5 % 100 mL IVPB  Status:  Discontinued     500 mg 112.5 mL/hr over 60 Minutes Intravenous Every 24 hours 03/28/17 1922 03/29/17 1317   03/28/17 1930  vancomycin (VANCOCIN) IVPB 1000 mg/200 mL premix     1,000 mg 200 mL/hr over 60 Minutes Intravenous  Once 03/28/17 1922 03/28/17 2108   03/28/17 1620  bacitracin 50,000 Units in sodium chloride irrigation 0.9 % 500 mL irrigation  Status:  Discontinued       As needed 03/28/17 1718 03/28/17 1921  03/28/17 1530  vancomycin (VANCOCIN) IVPB 1000 mg/200 mL premix     1,000 mg 200 mL/hr over 60 Minutes Intravenous  Once 03/28/17 1525 03/28/17 1702   03/28/17 1529  vancomycin (VANCOCIN) 1-5 GM/200ML-% IVPB    Comments:  [name]   : cabinet override      03/28/17 1529 03/28/17 1602   03/23/17 1200  doxycycline (VIBRA-TABS)  tablet 100 mg     100 mg Oral Every 12 hours 03/23/17 0954     03/22/17 2030  vancomycin (VANCOCIN) IVPB 1000 mg/200 mL premix     1,000 mg 200 mL/hr over 60 Minutes Intravenous Every 12 hours 03/22/17 1912 03/23/17 0939   03/22/17 1428  bacitracin 50,000 Units in sodium chloride irrigation 0.9 % 500 mL irrigation  Status:  Discontinued       As needed 03/22/17 1428 03/22/17 1550   03/20/17 1100  doxycycline (VIBRA-TABS) tablet 100 mg  Status:  Discontinued     100 mg Oral Every 12 hours 03/20/17 1011 03/23/17 0954   03/20/17 0000  doxycycline (VIBRA-TABS) 100 MG tablet     100 mg Oral Every 12 hours 03/20/17 1245     03/13/17 1930  vancomycin (VANCOCIN) IVPB 1000 mg/200 mL premix     1,000 mg 200 mL/hr over 60 Minutes Intravenous Every 12 hours 03/13/17 1836 03/14/17 0756   03/13/17 0858  bacitracin 50,000 Units in sodium chloride irrigation 0.9 % 500 mL irrigation  Status:  Discontinued       As needed 03/13/17 0858 03/13/17 1604   03/13/17 0730  vancomycin (VANCOCIN) IVPB 1000 mg/200 mL premix  Status:  Discontinued     1,000 mg 200 mL/hr over 60 Minutes Intravenous To Surgery 03/13/17 0729 03/13/17 1810       Subjective: Pt has no new complaints reported to me.   Objective: Vitals:   03/29/17 1300 03/29/17 1400 03/29/17 1500 03/29/17 1600  BP: 112/69 (!) 97/58 124/82 104/66  Pulse: 83 61 84 64  Resp: 17 (!) 21 (!) 21 14  Temp:      TempSrc:      SpO2: 98% 95% 97% 95%  Weight:      Height:        Intake/Output Summary (Last 24 hours) at 03/29/2017 1654 Last data filed at 03/29/2017 1600 Gross per 24 hour  Intake 3197.5 ml  Output 2613 ml  Net 584.5 ml   Filed Weights   03/27/17 0635 03/28/17 0500 03/29/17 0500  Weight: 102.7 kg (226 lb 6.6 oz) 103.4 kg (227 lb 15.3 oz) 102.4 kg (225 lb 12 oz)    Examination:   General exam:  Pt in nad, alert and awake HEENT:  MMM, posterior scalp craniocaudal incision in the midline with staples.  Edges well approximated, no  induration or erythema.  She continues to have serous fluid leaking from the inferior portion of her incision with a saturated dressing at the inferior portion. Respiratory system: Clear to auscultation bilaterally, equal chest rise. Cardiovascular system: Regular rate and rhythm, normal S1/S2. No murmurs, rubs, gallops or clicks.  Warm extremities Gastrointestinal system: Normal active bowel sounds, soft, nondistended, nontender. MSK:  Normal tone and bulk, no lower extremity edema Neuro:  Grossly intact   Data Reviewed: I have personally reviewed following labs and imaging studies  CBC: Recent Labs  Lab 03/25/17 0637 03/26/17 0515 03/27/17 0500 03/28/17 0349 03/29/17 0428  WBC 7.6 7.4 8.5 9.0 9.3  HGB 9.7* 9.9* 10.3* 9.7* 9.9*  HCT 29.4* 30.6* 31.1* 31.0*  30.4*  MCV 98.7 99.7 99.0 99.4 100.0  PLT 165 165 145* 141* 502*   Basic Metabolic Panel: Recent Labs  Lab 03/25/17 0637 03/26/17 0515 03/27/17 0500 03/28/17 0349 03/29/17 0428  NA 138 138 139 134* 137  K 4.6 4.4 4.2 4.3 5.0  CL 102 105 105 103 105  CO2 _0 GLUCOSE 221* 220* 146* 241* 178*  BUN 31* 30* 28* 27* 21*  CREATININE 0.71 0.75 0.61 0.69 0.60  CALCIUM 8.9 8.8* 8.9 8.6* 8.4*   GFR: Estimated Creatinine Clearance: 83.5 mL/min (by C-G formula based on SCr of 0.6 mg/dL). Liver Function Tests: No results for input(s): AST, ALT, ALKPHOS, BILITOT, PROT, ALBUMIN in the last 168 hours. No results for input(s): LIPASE, AMYLASE in the last 168 hours. No results for input(s): AMMONIA in the last 168 hours. Coagulation Profile: No results for input(s): INR, PROTIME in the last 168 hours. Cardiac Enzymes: No results for input(s): CKTOTAL, CKMB, CKMBINDEX, TROPONINI in the last 168 hours. BNP (last 3 results) No results for input(s): PROBNP in the last 8760 hours. HbA1C: No results for input(s): HGBA1C in the last 72 hours. CBG: Recent Labs  Lab 03/28/17 1932 03/28/17 2120 03/29/17 0834  03/29/17 1205 03/29/17 1640  GLUCAP 120* 162* 183* 265* 174*   Lipid Profile: No results for input(s): CHOL, HDL, LDLCALC, TRIG, CHOLHDL, LDLDIRECT in the last 72 hours. Thyroid Function Tests: No results for input(s): TSH, T4TOTAL, FREET4, T3FREE, THYROIDAB in the last 72 hours. Anemia Panel: No results for input(s): VITAMINB12, FOLATE, FERRITIN, TIBC, IRON, RETICCTPCT in the last 72 hours. Urine analysis: No results found for: COLORURINE, APPEARANCEUR, LABSPEC, PHURINE, GLUCOSEU, HGBUR, BILIRUBINUR, KETONESUR, PROTEINUR, UROBILINOGEN, NITRITE, LEUKOCYTESUR Sepsis Labs: _1 (procalcitonin:4,lacticidven:4)  )No results found for this or any previous visit (from the past 240 hour(s)).    Radiology Studies: Ct Head Wo Contrast  Result Date: 03/27/2017 CLINICAL DATA:  Headache, suspect CSF leak. Status post craniotomy for debulking of cerebellar metastasis. EXAM: CT HEAD WITHOUT CONTRAST TECHNIQUE: Contiguous axial images were obtained from the base of the skull through the vertex without intravenous contrast. COMPARISON:  CT HEAD March 07, 2017 and MRI of the head March 07, 2017 and March 14, 2017 FINDINGS: BRAIN: Status post debulking of partially calcified RIGHT cerebellar mass with small amount of residual calcifications, mild mass effect, improved from prior CT. Trace falcotentorial heterogeneous collection. Prominent frontoparietal extra-axial spaces measuring to 5 mm. No intraparenchymal hemorrhage or acute large vascular territory infarct. Resolution of hydrocephalus, slit-like third ventricle. Patchy supratentorial white matter hypodensities most compatible with small vessel ischemic disease. Basal cisterns maintained. New subcentimeter foci of fat density prepontine cistern, suprasellar cerebellar cistern. VASCULAR: Moderate calcific atherosclerosis of the carotid siphons. SKULL: Interval suboccipital craniectomy. Small amount of subcutaneous gas craniocervical thecal sac.  3.6 x 5.3 x 7 cm (caudal aspect incompletely imaged) suboccipital fluid collections with marginal densities, probable calcifications. Overlying skin staples. No significant scalp soft tissue swelling. SINUSES/ORBITS: The mastoid air-cells and included paranasal sinuses are well-aerated.The included ocular globes and orbital contents are non-suspicious. OTHER: None. IMPRESSION: 1. Status post suboccipital craniectomy for debulking of cerebellar metastasis with decreased mass effect, overall improved appearance and resolution of hydrocephalus. 2. Newly prominent frontoparietal and trans falcine fluid collections measuring to 4 mm. 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 Constellation of findings most compatible with intracranial hypotension and CSF leak. Minimal thecal sac gas. Electronically Signed   By: Elon Alas M.D.   On: 03/27/2017 19:04  Scheduled Meds: . amLODipine  10 mg Oral Daily  . chlorhexidine  15 mL Mouth Rinse BID  . Chlorhexidine Gluconate Cloth  6 each Topical Q0600  . dexamethasone  4 mg Intravenous Q6H  . docusate sodium  100 mg Oral BID  . doxycycline  100 mg Oral Q12H  . feeding supplement (GLUCERNA SHAKE)  237 mL Oral BID BM  . ferrous sulfate  325 mg Oral BID WC  . insulin aspart  0-20 Units Subcutaneous TID WC  . insulin aspart  0-5 Units Subcutaneous QHS  . insulin aspart  10 Units Subcutaneous TID WC  . insulin glargine  24 Units Subcutaneous QHS  . labetalol  200 mg Oral TID  . lisinopril  5 mg Oral Daily  . mirtazapine  15 mg Oral QHS  . nystatin-triamcinolone   Topical BID  . pantoprazole  40 mg Oral QHS  . polyethylene glycol  17 g Oral Daily  . senna  1 tablet Oral BID  . sodium chloride flush  10-40 mL Intracatheter Q12H   Continuous Infusions: . sodium chloride 10 mL/hr at 03/29/17 1300  . [START ON 03/30/2017] gentamicin    . levETIRAcetam Stopped (03/29/17 7793)  . vancomycin Stopped (03/29/17 0722)     LOS: 22 days     Time spent: 20 min  Velvet Bathe, MD [facility] Pager [phone]  If 7PM-7AM, please contact night-coverage [email or web address] Password [facility] 03/29/2017, 4:54 PM

## 2017-03-29 NOTE — Progress Notes (Signed)
Patient ID: Denise Morton, female   DOB: [birth year], 67 y.o.   MRN: [id number] Looks good, dressing dry, ventric 5-10 cc clear CSF per hour now, awake alert, states she feels better, no headache

## 2017-03-29 NOTE — Progress Notes (Signed)
Pharmacy Antibiotic Note  Denise Morton is a 68 y.o. female admitted on 03/07/2017 with surgical prophylaxis.   PT is s/p ventriculostomy.  Using extended interval gent dosing Initial 10 hr lvl 5.4 - borderline q24/36 h dosing  Plan: - vanc 750 mg q12h - change gent 500 mg to q36 h based on nomogram - monitor renal fx lot lvls prn  - doxy 100 mg bid - consider dc doxy - pending contact with NS   Height: 5\' 6"  (167.6 cm) Weight: 225 lb 12 oz (102.4 kg) IBW/kg (Calculated) : 59.3  Temp (24hrs), Avg:97.8 F (36.6 C), Min:97.2 F (36.2 C), Max:98.3 F (36.8 C)  Recent Labs  Lab 03/25/17 0637 03/26/17 0515 03/27/17 0500 03/28/17 0349 03/29/17 0428 03/29/17 0851  WBC 7.6 7.4 8.5 9.0 9.3  --   CREATININE 0.71 0.75 0.61 0.69 0.60  --   GENTRANDOM  --   --   --   --   --  5.4    Estimated Creatinine Clearance: 83.5 mL/min (by C-G formula based on SCr of 0.6 mg/dL).    Allergies  Allergen Reactions  . Penicillin G Rash    Has patient had a PCN reaction causing immediate rash, facial/tongue/throat swelling, SOB or lightheadedness with hypotension: Unknown Has patient had a PCN reaction causing severe rash involving mucus membranes or skin necrosis: Unknown Has patient had a PCN reaction that required hospitalization: Unknown Has patient had a PCN reaction occurring within the last 10 years: Unknown If all of the above answers are "NO", then may proceed with Cephalosporin use.   Denise Morton Antibiotics Rash   Denise Morton, PharmD, BCPS, BCCCP Clinical Pharmacist Clinical phone for 03/29/2017 from 7a-3:30p: [phone] If after 3:30p, please call main pharmacy at: [phone] 03/29/2017 1:18 PM

## 2017-03-30 LAB — GLUCOSE, CAPILLARY
GLUCOSE-CAPILLARY: 219 mg/dL — AB (ref 65–99)
GLUCOSE-CAPILLARY: 234 mg/dL — AB (ref 65–99)
GLUCOSE-CAPILLARY: 194 mg/dL — AB (ref 65–99)
Glucose-Capillary: 191 mg/dL — ABNORMAL HIGH (ref 65–99)
Glucose-Capillary: 236 mg/dL — ABNORMAL HIGH (ref 65–99)

## 2017-03-30 LAB — BASIC METABOLIC PANEL
Anion gap: 9 (ref 5–15)
BUN: 31 mg/dL — AB (ref 6–20)
CHLORIDE: 105 mmol/L (ref 101–111)
CO2: 23 mmol/L (ref 22–32)
Calcium: 8.5 mg/dL — ABNORMAL LOW (ref 8.9–10.3)
Creatinine, Ser: 0.69 mg/dL (ref 0.44–1.00)
GFR calc Af Amer: >60 mL/min (ref >60)
GFR calc non Af Amer: >60 mL/min (ref >60)
Glucose, Bld: 214 mg/dL — ABNORMAL HIGH (ref 65–99)
POTASSIUM: 4.7 mmol/L (ref 3.5–5.1)
SODIUM: 137 mmol/L (ref 135–145)

## 2017-03-30 LAB — CBC
HEMATOCRIT: 30.4 % — AB (ref 36.0–46.0)
Hemoglobin: 9.6 g/dL — ABNORMAL LOW (ref 12.0–15.0)
MCH: 31.7 pg (ref 26.0–34.0)
MCHC: 31.6 g/dL (ref 30.0–36.0)
MCV: 100.3 fL — AB (ref 78.0–100.0)
Platelets: 117 10*3/uL — ABNORMAL LOW (ref 150–400)
RBC: 3.03 MIL/uL — ABNORMAL LOW (ref 3.87–5.11)
RDW: 19.4 % — AB (ref 11.5–15.5)
WBC: 8 10*3/uL (ref 4.0–10.5)

## 2017-03-30 MED ORDER — CHLORHEXIDINE GLUCONATE CLOTH 2 % EX PADS
6.0000 | MEDICATED_PAD | Freq: Every day | CUTANEOUS | Status: DC
  Administered 2017-04-01 – 2017-04-14 (×14): 6 via TOPICAL

## 2017-03-30 NOTE — Clinical Social Work Note (Signed)
Clinical Social Worker received report from previous unit CSW that patient had a bed at [facility] with insurance authorization pending prior to medical decline.  Patient continues with a drain and not medically appropriate to pursue discharge needs.  CSW to follow up and pursue insurance authorization and check bed availability once patient medically stable.  Barbette Or, [place]

## 2017-03-30 NOTE — Progress Notes (Signed)
Patient ID: Denise Morton, female   DOB: 1950/02/03, 67 y.o.   MRN: [id number]  This NP visited patient at the bedside as a follow up for palliative medicine needs and emotional support.   Patient has o/c of headache, pain, dizziness, visual changes, weakness.  Complicated hospitalization 2/2, metastatic breast cancer;   CT and MRI for large Right cerebellar met as well as a smaller right occipital met, s/p craniotomy for resection followed by CSF leak.    Patient is postop day 2 from reexploration and re-repair of primary CSF leak with placement of right frontal external ventricular drain.  I spoke with Dr. Saintclair Halsted this morning and plan is for her to remain in the ICU over the next several days, monitor, hope is for CSF leak to heal and continued progression.    Patient's ultimate goal is discharge from hospital and proceed with short-term rehab.  I placed a call,  with patient's permission and spoke to her son Shanon Brow.  We discussed the current medical situation and the importance of continued conversation regarding  overall plan of care and treatment options,  ensuring decisions are within the context of the patients values and GOCs.  We discussed a established radiation-oncology appointment on March 26, and plan is to keep that appointment for now.    Patient and her family will  face  treatment option decisions , advanced directive decisions,and anticipatory care needs along the course of this disease trajectory.  Patient  is open to all offered and available medical interventions to prolong life  Questions and concerns addressed     I offered emotional support and help in  navigating their options.  I left my name and contact number with Shanon Brow, explained that I will not be back in the hospital until Monday and I will follow-up with her at that time.  Patient and family are encouraged to call the team phone number with questions or concerns.  Discussed with Dr Saintclair Halsted  Time in 1340      Time out  1415   Total time spent on the unit was 35   minutes  Greater than 50% of the time was spent in counseling and coordination of care  Wadie Lessen NP  Palliative Medicine Team Team Phone # [phone] Pager [phone]

## 2017-03-30 NOTE — Progress Notes (Signed)
PROGRESS NOTE  Denise Morton  KGY:[id number] DOB: [birth year] DOA: 03/07/2017 PCP: Seward Carol, MD  Brief Narrative:  67 y.o.femaleWith pmhx of BRCA, DM, admitted on 03/07/17 with weakness, dizziness, anorexia, speech difficulties and near syncope. MRI brain demonstrated metastatic lesions to the cerebellum and right occipital areas. She was initially admitted to [facility].  Neurosurgery was consulted and patient was transferred to [facility] on 2/23. She underwent suboccipital craniectomy for resection of right cerebellar meton 2/25 by Dr. Saintclair Halsted.  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  Symptomatically she is feeling much better with improved strength, speech, appetite.  Returned to the OR on 3/6 for correction of her CSF leak underwent reexploration of the suboccipital incision for repair of primary CSF leak utilizing DuraMatrix patch with fascial patch grafts.  She has continued to have a slow CSF leak.  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  Dr. Saintclair Halsted planning to do a stereotactic ventriculostomy placement and primary reexploration of her incision and reclosure on 3/12.    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  -  Appreciate Palliative care consultation - Management per primary  2)Type 2 diabetes, blood sugars slightly elevated but NPO today for surgery - Lantus increased to 24 units, will continue for today. - Continue aspart with meals to 10 units 3 times daily - Continue high dose sliding scale insulin  3)hypertension, blood pressure gradually trending down -  Continue Norvasc -  continue labetalol -  Continue lisinopril  -   Continue labetalol and hydralazine prn  4]anemia of chronic disease, hemoglobin improving -   Occult stool -  Continue iron supplementation (do not see that testing was performed)  Leukocytosis, resolved.   DVT prophylaxis:  SCDs  Code Status: Full code Family Communication:  No family at bedside today Disposition Plan:  Ongoing management of CSF leak by neurosurgery.     Consultants:   Neurosurgery  XRT oncology  PM&R  Procedures:  03/13/2017 suboccipital craniectomy for resection of right cerebellar met  03/22/2017:  Wound revision due to CSF leak 03/28/2017:  Stereotactic ventriculostomy placement and primary reexploration of her incision and reclosure on 3/12  Antimicrobials:  Anti-infectives (From admission, onward)   Start     Dose/Rate Route Frequency Ordered Stop   03/30/17 1000  gentamicin (GARAMYCIN) 500 mg in dextrose 5 % 100 mL IVPB     500 mg 112.5 mL/hr over 60 Minutes Intravenous Every 36 hours 03/29/17 1317     03/29/17 0700  vancomycin (VANCOCIN) IVPB 750 mg/150 ml premix     750 mg 150 mL/hr over 60 Minutes Intravenous Every 12 hours 03/28/17 2003     03/28/17 2030  gentamicin (GARAMYCIN) 500 mg in dextrose 5 % 100 mL IVPB  Status:  Discontinued     500 mg 112.5 mL/hr over 60 Minutes Intravenous Every 24 hours 03/28/17 1922 03/29/17 1317   03/28/17 1930  vancomycin (VANCOCIN) IVPB 1000 mg/200 mL premix     1,000 mg 200 mL/hr over 60 Minutes Intravenous  Once 03/28/17 1922 03/28/17 2108   03/28/17 1620  bacitracin 50,000 Units in sodium chloride irrigation 0.9 % 500 mL irrigation  Status:  Discontinued       As needed 03/28/17 1718  03/28/17 1921   03/28/17 1530  vancomycin (VANCOCIN) IVPB 1000 mg/200 mL premix     1,000 mg 200 mL/hr over 60 Minutes Intravenous  Once 03/28/17 1525 03/28/17 1702   03/28/17 1529  vancomycin (VANCOCIN) 1-5 GM/200ML-% IVPB    Comments:  Henrine Screws   : cabinet override      03/28/17 1529 03/28/17 1602   03/23/17 1200   doxycycline (VIBRA-TABS) tablet 100 mg     100 mg Oral Every 12 hours 03/23/17 0954     03/22/17 2030  vancomycin (VANCOCIN) IVPB 1000 mg/200 mL premix     1,000 mg 200 mL/hr over 60 Minutes Intravenous Every 12 hours 03/22/17 1912 03/23/17 0939   03/22/17 1428  bacitracin 50,000 Units in sodium chloride irrigation 0.9 % 500 mL irrigation  Status:  Discontinued       As needed 03/22/17 1428 03/22/17 1550   03/20/17 1100  doxycycline (VIBRA-TABS) tablet 100 mg  Status:  Discontinued     100 mg Oral Every 12 hours 03/20/17 1011 03/23/17 0954   03/20/17 0000  doxycycline (VIBRA-TABS) 100 MG tablet     100 mg Oral Every 12 hours 03/20/17 1245     03/13/17 1930  vancomycin (VANCOCIN) IVPB 1000 mg/200 mL premix     1,000 mg 200 mL/hr over 60 Minutes Intravenous Every 12 hours 03/13/17 1836 03/14/17 0756   03/13/17 0858  bacitracin 50,000 Units in sodium chloride irrigation 0.9 % 500 mL irrigation  Status:  Discontinued       As needed 03/13/17 0858 03/13/17 1604   03/13/17 0730  vancomycin (VANCOCIN) IVPB 1000 mg/200 mL premix  Status:  Discontinued     1,000 mg 200 mL/hr over 60 Minutes Intravenous To Surgery 03/13/17 0729 03/13/17 1810       Subjective: Pt has no new complaints reported to me.   Objective: Vitals:   03/30/17 1100 03/30/17 1156 03/30/17 1200 03/30/17 1300  BP: 132/76 (!) 148/84 136/89 (!) 144/90  Pulse: (!) 58 87 88 96  Resp: 14  18 17   Temp:   98.6 F (37 C)   TempSrc:   Oral   SpO2: 94%  97% 95%  Weight:      Height:        Intake/Output Summary (Last 24 hours) at 03/30/2017 1427 Last data filed at 03/30/2017 1400 Gross per 24 hour  Intake 1285 ml  Output 1661 ml  Net -376 ml   Filed Weights   03/28/17 0500 03/29/17 0500 03/30/17 0500  Weight: 103.4 kg (227 lb 15.3 oz) 102.4 kg (225 lb 12 oz) 103.1 kg (227 lb 4.7 oz)    Examination:   General exam:  Pt in nad, alert and awake HEENT:  MMM, posterior scalp craniocaudal incision in the midline with  staples.  Edges well approximated, no induration or erythema.  She continues to have serous fluid leaking from the inferior portion of her incision with a saturated dressing at the inferior portion. Respiratory system: Clear to auscultation bilaterally, equal chest rise. Cardiovascular system: Regular rate and rhythm, normal S1/S2. No murmurs, rubs, gallops or clicks.  Warm extremities Gastrointestinal system: Normal active bowel sounds, soft, nondistended, nontender. MSK:  Normal tone and bulk, no lower extremity edema Neuro:  Grossly intact   Data Reviewed: I have personally reviewed following labs and imaging studies  CBC: Recent Labs  Lab 03/26/17 0515 03/27/17 0500 03/28/17 0349 03/29/17 0428 03/30/17 0425  WBC 7.4 8.5 9.0 9.3 8.0  HGB 9.9* 10.3* 9.7* 9.9*  9.6*  HCT 30.6* 31.1* 31.0* 30.4* 30.4*  MCV 99.7 99.0 99.4 100.0 100.3*  PLT 165 145* 141* 127* 426*   Basic Metabolic Panel: Recent Labs  Lab 03/26/17 0515 03/27/17 0500 03/28/17 0349 03/29/17 0428 03/30/17 0425  NA 138 139 134* 137 137  K 4.4 4.2 4.3 5.0 4.7  CL 105 105 103 105 105  CO2 25 25 24 23 23   GLUCOSE 220* 146* 241* 178* 214*  BUN 30* 28* 27* 21* 31*  CREATININE 0.75 0.61 0.69 0.60 0.69  CALCIUM 8.8* 8.9 8.6* 8.4* 8.5*   GFR: Estimated Creatinine Clearance: 83.9 mL/min (by C-G formula based on SCr of 0.69 mg/dL). Liver Function Tests: No results for input(s): AST, ALT, ALKPHOS, BILITOT, PROT, ALBUMIN in the last 168 hours. No results for input(s): LIPASE, AMYLASE in the last 168 hours. No results for input(s): AMMONIA in the last 168 hours. Coagulation Profile: No results for input(s): INR, PROTIME in the last 168 hours. Cardiac Enzymes: No results for input(s): CKTOTAL, CKMB, CKMBINDEX, TROPONINI in the last 168 hours. BNP (last 3 results) No results for input(s): PROBNP in the last 8760 hours. HbA1C: No results for input(s): HGBA1C in the last 72 hours. CBG: Recent Labs  Lab 03/29/17 1205  03/29/17 1640 03/29/17 2124 03/30/17 0843 03/30/17 1110  GLUCAP 265* 174* 199* 191* 236*   Lipid Profile: No results for input(s): CHOL, HDL, LDLCALC, TRIG, CHOLHDL, LDLDIRECT in the last 72 hours. Thyroid Function Tests: No results for input(s): TSH, T4TOTAL, FREET4, T3FREE, THYROIDAB in the last 72 hours. Anemia Panel: No results for input(s): VITAMINB12, FOLATE, FERRITIN, TIBC, IRON, RETICCTPCT in the last 72 hours. Urine analysis: No results found for: COLORURINE, APPEARANCEUR, LABSPEC, PHURINE, GLUCOSEU, HGBUR, BILIRUBINUR, KETONESUR, PROTEINUR, UROBILINOGEN, NITRITE, LEUKOCYTESUR Sepsis Labs: @LABRCNTIP (procalcitonin:4,lacticidven:4)  )No results found for this or any previous visit (from the past 240 hour(s)).    Radiology Studies: No results found.   Scheduled Meds: . amLODipine  10 mg Oral Daily  . chlorhexidine  15 mL Mouth Rinse BID  . Chlorhexidine Gluconate Cloth  6 each Topical Q0600  . dexamethasone  4 mg Intravenous Q6H  . docusate sodium  100 mg Oral BID  . doxycycline  100 mg Oral Q12H  . feeding supplement (GLUCERNA SHAKE)  237 mL Oral BID BM  . ferrous sulfate  325 mg Oral BID WC  . insulin aspart  0-20 Units Subcutaneous TID WC  . insulin aspart  0-5 Units Subcutaneous QHS  . insulin aspart  10 Units Subcutaneous TID WC  . insulin glargine  24 Units Subcutaneous QHS  . labetalol  200 mg Oral TID  . lisinopril  5 mg Oral Daily  . mirtazapine  15 mg Oral QHS  . nystatin-triamcinolone   Topical BID  . pantoprazole  40 mg Oral QHS  . polyethylene glycol  17 g Oral Daily  . senna  1 tablet Oral BID  . sodium chloride flush  10-40 mL Intracatheter Q12H   Continuous Infusions: . sodium chloride 10 mL/hr at 03/29/17 2000  . gentamicin Stopped (03/30/17 1110)  . levETIRAcetam Stopped (03/30/17 0851)  . vancomycin Stopped (03/30/17 0715)     LOS: 23 days    Time spent: 15 min  Velvet Bathe, MD [facility] Pager [phone]  If 7PM-7AM,  please contact night-coverage [email or web address] Password [facility] 03/30/2017, 2:27 PM

## 2017-03-30 NOTE — Progress Notes (Signed)
Physical Therapy Treatment Patient Details Name: Denise Morton MRN: [id number] DOB: 1950/08/05 Today's Date: 03/30/2017    History of Present Illness  Denise Morton is a 67 y.o. female admitted with weakness and gastritis found to have cerebellar and occipital massess s/p resection 2/25 of right cerebellar mass, pt also with mets to liver, lung and bone, 3/6 with repeat sx for CSF leak repair, 3/12 with ventric drain placed.  PMHx of Breast CA, DM, central retinal vein occlusion right eye    PT Comments    Pt pleasant and very happy to be able to get up and OOB. Pt with increased difficulty with all transfers and gait today reporting fatigue and weakness. Pt encouraged to mobilize with nursing and perform HEP. Will continue to follow.    Follow Up Recommendations  Supervision/Assistance - 24 hour;SNF     Equipment Recommendations  Rolling walker with 5" wheels    Recommendations for Other Services       Precautions / Restrictions Precautions Precautions: Fall Precaution Comments: EVD    Mobility  Bed Mobility Overal bed mobility: Needs Assistance Bed Mobility: Supine to Sit     Supine to sit: Min assist;HOB elevated     General bed mobility comments: min assist to elevate trunk from surface, HOB 30, increased time  Transfers Overall transfer level: Needs assistance   Transfers: Sit to/from Stand Sit to Stand: Mod assist;Min assist Stand pivot transfers: Min assist       General transfer comment: min assist to stand from bed, mod assist to stand x 6 trials from recliner with increased time to rise and extend hips. Pivot recliner to [facility] with RW  Ambulation/Gait Ambulation/Gait assistance: Min assist Ambulation Distance (Feet): 15 Feet Assistive device: Rolling walker (2 wheeled) Gait Pattern/deviations: Step-through pattern;Decreased stride length;Trunk flexed   Gait velocity interpretation: Below normal speed for age/gender General Gait Details: slow cautious  gait with increased assist to direct and control RW, short steps   Stairs            Wheelchair Mobility    Modified Rankin (Stroke Patients Only)       Balance Overall balance assessment: Needs assistance   Sitting balance-Leahy Scale: Good       Standing balance-Leahy Scale: Poor                              Cognition Arousal/Alertness: Awake/alert Behavior During Therapy: WFL for tasks assessed/performed Overall Cognitive Status: Impaired/Different from baseline Area of Impairment: Safety/judgement;Problem solving;Orientation                 Orientation Level: Time Current Attention Level: Selective   Following Commands: Follows one step commands consistently Safety/Judgement: Decreased awareness of deficits   Problem Solving: Difficulty sequencing;Slow processing General Comments: pt with increased time and difficulty problem solving how to don sock, pt able to count by 2 to 50, oriented to place and situation, increased time to process commands and transfers      Exercises      General Comments        Pertinent Vitals/Pain Pain Assessment: No/denies pain    Home Living                      Prior Function            PT Goals (current goals can now be found in the care plan section) Progress towards PT goals: Progressing toward goals(goals  remain appropriate given medical change)    Frequency    Min 3X/week      PT Plan Current plan remains appropriate    Co-evaluation              AM-PAC PT "6 Clicks" Daily Activity  Outcome Measure  Difficulty turning over in bed (including adjusting bedclothes, sheets and blankets)?: None Difficulty moving from lying on back to sitting on the side of the bed? : Unable Difficulty sitting down on and standing up from a chair with arms (e.g., wheelchair, bedside commode, etc,.)?: Unable Help needed moving to and from a bed to chair (including a wheelchair)?: A  Lot Help needed walking in hospital room?: A Lot Help needed climbing 3-5 steps with a railing? : A Lot 6 Click Score: 12    End of Session Equipment Utilized During Treatment: Gait belt Activity Tolerance: Patient tolerated treatment well Patient left: in chair;with call bell/phone within reach;with chair alarm set Nurse Communication: Mobility status;Precautions PT Visit Diagnosis: Other symptoms and signs involving the nervous system (R29.898)     Time: 9480-1655 PT Time Calculation (min) (ACUTE ONLY): 32 min  Charges:  $Gait Training: 8-22 mins $Therapeutic Activity: 8-22 mins                    G Codes:       Elwyn Reach, PT [phone]    [place] 03/30/2017, 12:08 PM

## 2017-03-31 DIAGNOSIS — L8990 Pressure ulcer of unspecified site, unspecified stage: Secondary | ICD-10-CM

## 2017-03-31 LAB — GLUCOSE, CAPILLARY
GLUCOSE-CAPILLARY: 184 mg/dL — AB (ref 65–99)
GLUCOSE-CAPILLARY: 112 mg/dL — AB (ref 65–99)
GLUCOSE-CAPILLARY: 180 mg/dL — AB (ref 65–99)
GLUCOSE-CAPILLARY: 230 mg/dL — AB (ref 65–99)
Glucose-Capillary: 209 mg/dL — ABNORMAL HIGH (ref 65–99)
Glucose-Capillary: 181 mg/dL — ABNORMAL HIGH (ref 65–99)

## 2017-03-31 LAB — CBC
HCT: 31.2 % — ABNORMAL LOW (ref 36.0–46.0)
Hemoglobin: 9.9 g/dL — ABNORMAL LOW (ref 12.0–15.0)
MCH: 32.1 pg (ref 26.0–34.0)
MCHC: 31.7 g/dL (ref 30.0–36.0)
MCV: 101.3 fL — AB (ref 78.0–100.0)
Platelets: 108 10*3/uL — ABNORMAL LOW (ref 150–400)
RBC: 3.08 MIL/uL — ABNORMAL LOW (ref 3.87–5.11)
RDW: 19.3 % — AB (ref 11.5–15.5)
WBC: 7 10*3/uL (ref 4.0–10.5)

## 2017-03-31 LAB — VANCOMYCIN, TROUGH: Vancomycin Tr: 15 ug/mL (ref 15–20)

## 2017-03-31 LAB — BASIC METABOLIC PANEL
Anion gap: 10 (ref 5–15)
BUN: 41 mg/dL — AB (ref 6–20)
CHLORIDE: 106 mmol/L (ref 101–111)
CO2: 20 mmol/L — AB (ref 22–32)
CREATININE: 0.89 mg/dL (ref 0.44–1.00)
Calcium: 8.7 mg/dL — ABNORMAL LOW (ref 8.9–10.3)
GFR calc Af Amer: >60 mL/min (ref >60)
GFR calc non Af Amer: >60 mL/min (ref >60)
GLUCOSE: 188 mg/dL — AB (ref 65–99)
POTASSIUM: 4.7 mmol/L (ref 3.5–5.1)
SODIUM: 136 mmol/L (ref 135–145)

## 2017-03-31 MED ORDER — LEVETIRACETAM 500 MG PO TABS
500.0000 mg | ORAL_TABLET | Freq: Two times a day (BID) | ORAL | Status: DC
  Administered 2017-03-31 – 2017-04-14 (×28): 500 mg via ORAL
  Filled 2017-03-31 (×28): qty 1

## 2017-03-31 MED ORDER — DEXAMETHASONE 2 MG PO TABS
2.0000 mg | ORAL_TABLET | Freq: Four times a day (QID) | ORAL | Status: DC
  Administered 2017-03-31 – 2017-04-05 (×20): 2 mg via ORAL
  Filled 2017-03-31 (×20): qty 1

## 2017-03-31 NOTE — Progress Notes (Signed)
Pharmacy Antibiotic Note  Denise Morton is a 67 y.o. female admitted on 03/07/2017 with surgical prophylaxis.   PT is s/p ventriculostomy. Continues on vancomycin and gentamicin  Vancomycin trough = 15 (therapeutic)   Plan: Continue vancomycin 750 mg iv Q 12 Continue gentamicin 500 mg iv Q 36 hours   Height: 5\' 6"  (167.6 cm) Weight: 230 lb 6.1 oz (104.5 kg) IBW/kg (Calculated) : 59.3  Temp (24hrs), Avg:98 F (36.7 C), Min:97.6 F (36.4 C), Max:98.2 F (36.8 C)  Recent Labs  Lab 03/27/17 0500 03/28/17 0349 03/29/17 0428 03/29/17 0851 03/30/17 0425 03/31/17 0336 03/31/17 1729  WBC 8.5 9.0 9.3  --  8.0 7.0  --   CREATININE 0.61 0.69 0.60  --  0.69 0.89  --   VANCOTROUGH  --   --   --   --   --   --  15  GENTRANDOM  --   --   --  5.4  --   --   --     Estimated Creatinine Clearance: 76 mL/min (by C-G formula based on SCr of 0.89 mg/dL).    Allergies  Allergen Reactions  . Penicillin G Rash    Has patient had a PCN reaction causing immediate rash, facial/tongue/throat swelling, SOB or lightheadedness with hypotension: Unknown Has patient had a PCN reaction causing severe rash involving mucus membranes or skin necrosis: Unknown Has patient had a PCN reaction that required hospitalization: Unknown Has patient had a PCN reaction occurring within the last 10 years: Unknown If all of the above answers are "NO", then may proceed with Cephalosporin use.   . Sulfa Antibiotics Rash   Thank you Anette Guarneri, PharmD [phone]  03/31/2017 7:28 PM

## 2017-03-31 NOTE — Progress Notes (Signed)
PROGRESS NOTE  Denise Morton  VCB:[id number] DOB: 01-Apr-1950 DOA: 03/07/2017 PCP: Seward Carol, MD  Brief Narrative:  67 y.o.femaleWith pmhx of BRCA, DM, admitted on 03/07/17 with weakness, dizziness, anorexia, speech difficulties and near syncope. MRI brain demonstrated metastatic lesions to the cerebellum and right occipital areas. She was initially admitted to [facility].  Neurosurgery was consulted and patient was transferred to [facility] on 2/23. She underwent suboccipital craniectomy for resection of right cerebellar meton 2/25 by Dr. Saintclair Halsted.  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  Symptomatically she is feeling much better with improved strength, speech, appetite.  Returned to the OR on 3/6 for correction of her CSF leak underwent reexploration of the suboccipital incision for repair of primary CSF leak utilizing DuraMatrix patch with fascial patch grafts.  She has continued to have a slow CSF leak.  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  Dr. Saintclair Halsted planning to do a stereotactic ventriculostomy placement and primary reexploration of her incision and reclosure on 3/12.    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  - Appreciate Palliative Care consultation - Management per primary   2)Type 2 diabetes, blood sugars slightly elevated but NPO today for surgery - Lantus at 24 units - Continue aspart with meals to 10 units 3 times daily - Continue high dose sliding scale insulin   3)hypertension, blood pressure gradually trending down -  Continue Norvasc -  continue labetalol -  Continue lisinopril  -  Continue labetalol and hydralazine  prn  4]anemia of chronic disease, hemoglobin improving -   Occult stool -  Continue iron supplementation (do not see that testing was performed)  Leukocytosis, resolved.   DVT prophylaxis:  SCDs  Code Status: Full code Family Communication:  No family at bedside today Disposition Plan:  Ongoing management of CSF leak by neurosurgery.     Consultants:   Neurosurgery  XRT oncology  PM&R  Procedures:  03/13/2017 suboccipital craniectomy for resection of right cerebellar met  03/22/2017:  Wound revision due to CSF leak 03/28/2017:  Stereotactic ventriculostomy placement and primary reexploration of her incision and reclosure on 3/12  Antimicrobials:  Anti-infectives (From admission, onward)   Start     Dose/Rate Route Frequency Ordered Stop   03/30/17 1000  gentamicin (GARAMYCIN) 500 mg in dextrose 5 % 100 mL IVPB     500 mg 112.5 mL/hr over 60 Minutes Intravenous Every 36 hours 03/29/17 1317     03/29/17 0700  vancomycin (VANCOCIN) IVPB 750 mg/150 ml premix     750 mg 150 mL/hr over 60 Minutes Intravenous Every 12 hours 03/28/17 2003     03/28/17 2030  gentamicin (GARAMYCIN) 500 mg in dextrose 5 % 100 mL IVPB  Status:  Discontinued     500 mg 112.5 mL/hr over 60 Minutes Intravenous Every 24 hours 03/28/17 1922 03/29/17 1317   03/28/17 1930  vancomycin (VANCOCIN) IVPB 1000 mg/200 mL premix     1,000 mg 200 mL/hr over 60 Minutes Intravenous  Once 03/28/17 1922 03/28/17 2108   03/28/17 1620  bacitracin 50,000 Units in sodium chloride irrigation 0.9 % 500 mL irrigation  Status:  Discontinued       As needed 03/28/17 1718 03/28/17 1921  03/28/17 1530  vancomycin (VANCOCIN) IVPB 1000 mg/200 mL premix     1,000 mg 200 mL/hr over 60 Minutes Intravenous  Once 03/28/17 1525 03/28/17 1702   03/28/17 1529  vancomycin (VANCOCIN) 1-5 GM/200ML-% IVPB    Comments:  Henrine Screws   : cabinet override      03/28/17 1529 03/28/17 1602   03/23/17 1200  doxycycline (VIBRA-TABS) tablet 100 mg      100 mg Oral Every 12 hours 03/23/17 0954     03/22/17 2030  vancomycin (VANCOCIN) IVPB 1000 mg/200 mL premix     1,000 mg 200 mL/hr over 60 Minutes Intravenous Every 12 hours 03/22/17 1912 03/23/17 0939   03/22/17 1428  bacitracin 50,000 Units in sodium chloride irrigation 0.9 % 500 mL irrigation  Status:  Discontinued       As needed 03/22/17 1428 03/22/17 1550   03/20/17 1100  doxycycline (VIBRA-TABS) tablet 100 mg  Status:  Discontinued     100 mg Oral Every 12 hours 03/20/17 1011 03/23/17 0954   03/20/17 0000  doxycycline (VIBRA-TABS) 100 MG tablet     100 mg Oral Every 12 hours 03/20/17 1245     03/13/17 1930  vancomycin (VANCOCIN) IVPB 1000 mg/200 mL premix     1,000 mg 200 mL/hr over 60 Minutes Intravenous Every 12 hours 03/13/17 1836 03/14/17 0756   03/13/17 0858  bacitracin 50,000 Units in sodium chloride irrigation 0.9 % 500 mL irrigation  Status:  Discontinued       As needed 03/13/17 0858 03/13/17 1604   03/13/17 0730  vancomycin (VANCOCIN) IVPB 1000 mg/200 mL premix  Status:  Discontinued     1,000 mg 200 mL/hr over 60 Minutes Intravenous To Surgery 03/13/17 0729 03/13/17 1810       Subjective: Pt has no new complaints reported to me.   Objective: Vitals:   03/31/17 0800 03/31/17 1052 03/31/17 1200 03/31/17 1336  BP:  120/78  (!) 150/93  Pulse:  69  72  Resp:      Temp: 98 F (36.7 C)  98.2 F (36.8 C)   TempSrc: Oral  Oral   SpO2:    96%  Weight:      Height:        Intake/Output Summary (Last 24 hours) at 03/31/2017 1400 Last data filed at 03/31/2017 0700 Gross per 24 hour  Intake 930 ml  Output 1449 ml  Net -519 ml   Filed Weights   03/29/17 0500 03/30/17 0500 03/31/17 0400  Weight: 102.4 kg (225 lb 12 oz) 103.1 kg (227 lb 4.7 oz) 104.5 kg (230 lb 6.1 oz)    Examination:  Exam unchanged when compared to prior on 03/30/17  General exam:  Pt in nad, alert and awake HEENT:  MMM, posterior scalp craniocaudal incision in the midline with staples.   Edges well approximated, no induration or erythema.  She continues to have serous fluid leaking from the inferior portion of her incision with a saturated dressing at the inferior portion. Respiratory system: Clear to auscultation bilaterally, equal chest rise. Cardiovascular system: Regular rate and rhythm, normal S1/S2. No murmurs, rubs, gallops or clicks.  Warm extremities Gastrointestinal system: Normal active bowel sounds, soft, nondistended, nontender. MSK:  Normal tone and bulk, no lower extremity edema Neuro:  Grossly intact   Data Reviewed: I have personally reviewed following labs and imaging studies  CBC: Recent Labs  Lab 03/27/17 0500 03/28/17 0349 03/29/17 0428 03/30/17 0425 03/31/17 0336  WBC 8.5 9.0 9.3 8.0 7.0  HGB 10.3* 9.7* 9.9* 9.6* 9.9*  HCT 31.1* 31.0* 30.4* 30.4* 31.2*  MCV 99.0 99.4 100.0 100.3* 101.3*  PLT 145* 141* 127* 117* 166*   Basic Metabolic Panel: Recent Labs  Lab 03/27/17 0500 03/28/17 0349 03/29/17 0428 03/30/17 0425 03/31/17 0336  NA 139 134* 137 137 136  K 4.2 4.3 5.0 4.7 4.7  CL 105 103 105 105 106  CO2 25 24 23 23  20*  GLUCOSE 146* 241* 178* 214* 188*  BUN 28* 27* 21* 31* 41*  CREATININE 0.61 0.69 0.60 0.69 0.89  CALCIUM 8.9 8.6* 8.4* 8.5* 8.7*   GFR: Estimated Creatinine Clearance: 76 mL/min (by C-G formula based on SCr of 0.89 mg/dL). Liver Function Tests: No results for input(s): AST, ALT, ALKPHOS, BILITOT, PROT, ALBUMIN in the last 168 hours. No results for input(s): LIPASE, AMYLASE in the last 168 hours. No results for input(s): AMMONIA in the last 168 hours. Coagulation Profile: No results for input(s): INR, PROTIME in the last 168 hours. Cardiac Enzymes: No results for input(s): CKTOTAL, CKMB, CKMBINDEX, TROPONINI in the last 168 hours. BNP (last 3 results) No results for input(s): PROBNP in the last 8760 hours. HbA1C: No results for input(s): HGBA1C in the last 72 hours. CBG: Recent Labs  Lab 03/30/17 1519  03/30/17 1729 03/30/17 2214 03/31/17 0740 03/31/17 1152  GLUCAP 219* 234* 194* 209* 184*   Lipid Profile: No results for input(s): CHOL, HDL, LDLCALC, TRIG, CHOLHDL, LDLDIRECT in the last 72 hours. Thyroid Function Tests: No results for input(s): TSH, T4TOTAL, FREET4, T3FREE, THYROIDAB in the last 72 hours. Anemia Panel: No results for input(s): VITAMINB12, FOLATE, FERRITIN, TIBC, IRON, RETICCTPCT in the last 72 hours. Urine analysis: No results found for: COLORURINE, APPEARANCEUR, LABSPEC, PHURINE, GLUCOSEU, HGBUR, BILIRUBINUR, KETONESUR, PROTEINUR, UROBILINOGEN, NITRITE, LEUKOCYTESUR Sepsis Labs: @LABRCNTIP (procalcitonin:4,lacticidven:4)  )No results found for this or any previous visit (from the past 240 hour(s)).    Radiology Studies: No results found.   Scheduled Meds: . amLODipine  10 mg Oral Daily  . chlorhexidine  15 mL Mouth Rinse BID  . [START ON 04/01/2017] Chlorhexidine Gluconate Cloth  6 each Topical Q0600  . dexamethasone  2 mg Oral Q6H  . docusate sodium  100 mg Oral BID  . doxycycline  100 mg Oral Q12H  . feeding supplement (GLUCERNA SHAKE)  237 mL Oral BID BM  . ferrous sulfate  325 mg Oral BID WC  . insulin aspart  0-20 Units Subcutaneous TID WC  . insulin aspart  0-5 Units Subcutaneous QHS  . insulin aspart  10 Units Subcutaneous TID WC  . insulin glargine  24 Units Subcutaneous QHS  . labetalol  200 mg Oral TID  . lisinopril  5 mg Oral Daily  . mirtazapine  15 mg Oral QHS  . nystatin-triamcinolone   Topical BID  . pantoprazole  40 mg Oral QHS  . polyethylene glycol  17 g Oral Daily  . senna  1 tablet Oral BID  . sodium chloride flush  10-40 mL Intracatheter Q12H   Continuous Infusions: . sodium chloride 10 mL/hr at 03/31/17 0617  . gentamicin Stopped (03/30/17 1110)  . levETIRAcetam Stopped (03/31/17 0630)  . vancomycin Stopped (03/31/17 0714)     LOS: 24 days    Time spent: 15 min  Velvet Bathe, MD [facility] Pager [phone]  If 7PM-7AM, please contact night-coverage [email or web address] Password [email or web address] 03/31/2017, 2:00 PM

## 2017-03-31 NOTE — Progress Notes (Signed)
Physical Therapy Treatment Patient Details Name: Denise Morton MRN: [id number] DOB: Sep 03, 1950 Today's Date: 03/31/2017    History of Present Illness  Denise Morton is a 67 y.o. female admitted with weakness and gastritis found to have cerebellar and occipital massess s/p resection 2/25 of right cerebellar mass, pt also with mets to liver, lung and bone, 3/6 with repeat sx for CSF leak repair, 3/12 with ventric drain placed.  PMHx of Breast CA, DM, central retinal vein occlusion right eye    PT Comments    Pt pleasant with nausea and spitting up what appears to be most of her lunch. Pt required assist for all aspects of tranfers today with continued decline in cognition, processing and problem solving, decreased initiation and strength. Pt only able to pivot to chair today with fatigue. Pt encouraged to continue mobility with nursing staff and HEP. Will continue to follow but based on pt current decline will decrease goals.     Follow Up Recommendations  Supervision/Assistance - 24 hour;SNF     Equipment Recommendations  Rolling walker with 5" wheels    Recommendations for Other Services       Precautions / Restrictions Precautions Precautions: Fall Precaution Comments: EVD    Mobility  Bed Mobility Overal bed mobility: Needs Assistance Bed Mobility: Supine to Sit     Supine to sit: Min assist;HOB elevated     General bed mobility comments: min assist to elevate trunk from surface, HOB 30, increased time, use of rail, assist to move legs to initiate transition to EOB  Transfers Overall transfer level: Needs assistance   Transfers: Sit to/from Stand Sit to Stand: Min assist Stand pivot transfers: Mod assist       General transfer comment: min assist to stand from bed with elevated height, mod assist to pivot to recliner with RW and increased time. Sequential cues for foot placement, posture and safety with assist to direct and move RW  Ambulation/Gait              General Gait Details: unable to attempt today without +2   Stairs            Wheelchair Mobility    Modified Rankin (Stroke Patients Only)       Balance Overall balance assessment: Needs assistance   Sitting balance-Leahy Scale: Fair Sitting balance - Comments: flexed posture with anterior lean EOB today   Standing balance support: Bilateral upper extremity supported Standing balance-Leahy Scale: Poor                              Cognition Arousal/Alertness: Awake/alert Behavior During Therapy: WFL for tasks assessed/performed Overall Cognitive Status: Impaired/Different from baseline Area of Impairment: Safety/judgement;Problem solving;Orientation;Memory                   Current Attention Level: Sustained Memory: Decreased short-term memory Following Commands: Follows one step commands consistently Safety/Judgement: Decreased awareness of deficits;Decreased awareness of safety   Problem Solving: Difficulty sequencing;Slow processing;Decreased initiation General Comments: pt with decreased initiation and problem solving from prior sessions. Pt unable to recall standing repeated trials yesterday      Exercises General Exercises - Lower Extremity Long Arc Quad: AROM;Seated;Both;15 reps Hip Flexion/Marching: AROM;Both;Seated;15 reps    General Comments        Pertinent Vitals/Pain Pain Assessment: No/denies pain    Home Living  Prior Function            PT Goals (current goals can now be found in the care plan section) Progress towards PT goals: Not progressing toward goals - comment;Goals downgraded-see care plan    Frequency           PT Plan Current plan remains appropriate    Co-evaluation              AM-PAC PT "6 Clicks" Daily Activity  Outcome Measure  Difficulty turning over in bed (including adjusting bedclothes, sheets and blankets)?: A Lot Difficulty moving from lying on  back to sitting on the side of the bed? : Unable Difficulty sitting down on and standing up from a chair with arms (e.g., wheelchair, bedside commode, etc,.)?: Unable Help needed moving to and from a bed to chair (including a wheelchair)?: A Lot Help needed walking in hospital room?: A Lot Help needed climbing 3-5 steps with a railing? : A Lot 6 Click Score: 10    End of Session Equipment Utilized During Treatment: Gait belt Activity Tolerance: Patient tolerated treatment well Patient left: in chair;with call bell/phone within reach;with chair alarm set Nurse Communication: Mobility status;Precautions PT Visit Diagnosis: Other symptoms and signs involving the nervous system (R29.898);Muscle weakness (generalized) (M62.81);Other abnormalities of gait and mobility (R26.89)     Time: 4259-5638 PT Time Calculation (min) (ACUTE ONLY): 31 min  Charges:  $Therapeutic Exercise: 8-22 mins $Therapeutic Activity: 8-22 mins                    G Codes:       Elwyn Reach, PT [phone]    [place] 03/31/2017, 1:41 PM

## 2017-03-31 NOTE — Progress Notes (Signed)
Subjective: Patient reports patient doing well occasional headache but overall stable  Objective: Vital signs in last 24 hours: Temp:  [97.7 F (36.5 C)-98.6 F (37 C)] 98 F (36.7 C) (03/15 0800) Pulse Rate:  [57-96] 60 (03/15 0700) Resp:  [11-21] 15 (03/15 0700) BP: (103-148)/(61-92) 143/83 (03/15 0700) SpO2:  [91 %-97 %] 93 % (03/15 0700) Weight:  [104.5 kg (230 lb 6.1 oz)] 104.5 kg (230 lb 6.1 oz) (03/15 0400)  Intake/Output from previous day: 03/14 0701 - 03/15 0700 In: 1625 [P.O.:500; I.V.:240; IV Piggyback:885] Out: 2187 [Urine:1950; Drains:237] Intake/Output this shift: No intake/output data recorded.  awake alert oriented strength 5 out of 5 incisions clean dry and intact no evidence of drainage from her suboccipital incision.  Lab Results: Recent Labs    03/30/17 0425 03/31/17 0336  WBC 8.0 7.0  HGB 9.6* 9.9*  HCT 30.4* 31.2*  PLT 117* 108*   BMET Recent Labs    03/30/17 0425 03/31/17 0336  NA 137 136  K 4.7 4.7  CL 105 106  CO2 23 20*  GLUCOSE 214* 188*  BUN 31* 41*  CREATININE 0.69 0.89  CALCIUM 8.5* 8.7*    Studies/Results: No results found.  Assessment/Plan: Postop day 3 primary repair CSF leak and placement of right frontal external ventricular drain. Drains functioning well averaging 5-10 mL an hour and incision remains dry. We'll continue drainage until Sunday clamp on Sunday if dry and Monday we'll discontinue the ventricular drain.  LOS: 24 days     Abhimanyu Cruces P 03/31/2017, 10:14 AM

## 2017-04-01 ENCOUNTER — Inpatient Hospital Stay (HOSPITAL_COMMUNITY): Payer: PPO

## 2017-04-01 LAB — CBC
HEMATOCRIT: 29.8 % — AB (ref 36.0–46.0)
HEMOGLOBIN: 9.4 g/dL — AB (ref 12.0–15.0)
MCH: 31.8 pg (ref 26.0–34.0)
MCHC: 31.5 g/dL (ref 30.0–36.0)
MCV: 100.7 fL — ABNORMAL HIGH (ref 78.0–100.0)
Platelets: 109 10*3/uL — ABNORMAL LOW (ref 150–400)
RBC: 2.96 MIL/uL — AB (ref 3.87–5.11)
RDW: 19 % — ABNORMAL HIGH (ref 11.5–15.5)
WBC: 5.4 10*3/uL (ref 4.0–10.5)

## 2017-04-01 LAB — BASIC METABOLIC PANEL
ANION GAP: 8 (ref 5–15)
BUN: 33 mg/dL — ABNORMAL HIGH (ref 6–20)
CO2: 25 mmol/L (ref 22–32)
Calcium: 8.7 mg/dL — ABNORMAL LOW (ref 8.9–10.3)
Chloride: 103 mmol/L (ref 101–111)
Creatinine, Ser: 0.77 mg/dL (ref 0.44–1.00)
GLUCOSE: 162 mg/dL — AB (ref 65–99)
POTASSIUM: 4.5 mmol/L (ref 3.5–5.1)
Sodium: 136 mmol/L (ref 135–145)

## 2017-04-01 LAB — GLUCOSE, CAPILLARY
GLUCOSE-CAPILLARY: 190 mg/dL — AB (ref 65–99)
GLUCOSE-CAPILLARY: 159 mg/dL — AB (ref 65–99)
GLUCOSE-CAPILLARY: 111 mg/dL — AB (ref 65–99)
Glucose-Capillary: 164 mg/dL — ABNORMAL HIGH (ref 65–99)
Glucose-Capillary: 268 mg/dL — ABNORMAL HIGH (ref 65–99)

## 2017-04-01 MED ORDER — IOPAMIDOL (ISOVUE-370) INJECTION 76%
INTRAVENOUS | Status: AC
  Administered 2017-04-01: 50 mL
  Filled 2017-04-01: qty 50

## 2017-04-01 NOTE — Progress Notes (Signed)
Subjective: Patient reports patient doing well with minimal headache  Objective: Vital signs in last 24 hours: Temp:  [97.6 F (36.4 C)-98.8 F (37.1 C)] 98.8 F (37.1 C) (03/16 0400) Pulse Rate:  [55-80] 57 (03/16 0700) Resp:  [9-17] 13 (03/16 0700) BP: (94-150)/(61-98) 126/79 (03/16 0700) SpO2:  [91 %-100 %] 97 % (03/16 0700) Weight:  [103.5 kg (228 lb 2.8 oz)] 103.5 kg (228 lb 2.8 oz) (03/16 0500)  Intake/Output from previous day: 03/15 0701 - 03/16 0700 In: 652.5 [I.V.:240; IV Piggyback:412.5] Out: 435 [Urine:250; Drains:185] Intake/Output this shift: No intake/output data recorded.  neurologic exam stable incision clean dry and intact EVD functioning well  Lab Results: Recent Labs    03/31/17 0336 04/01/17 0621  WBC 7.0 5.4  HGB 9.9* 9.4*  HCT 31.2* 29.8*  PLT 108* 109*   BMET Recent Labs    03/31/17 0336 04/01/17 0621  NA 136 136  K 4.7 4.5  CL 106 103  CO2 20* 25  GLUCOSE 188* 162*  BUN 41* 33*  CREATININE 0.89 0.77  CALCIUM 8.7* 8.7*    Studies/Results: No results found.  Assessment/Plan: Postop day 4 revision and repair primary CSF leak with placement of an external ventricular drain. Patient doing well incisions dry drain working well continue drainage 1 more day clamp the drain taken out Monday morning.  LOS: 25 days     Denise Morton P 04/01/2017, 7:55 AM

## 2017-04-01 NOTE — Progress Notes (Signed)
Patient ID: Denise Morton, female   DOB: 12/22/1950, 67 y.o.   MRN: [id number] CT scan shows extensive amount of frontal pneumocephalus with some pneumocephalus and may track along the cavernous sinus and along the third nerve. It's possible this is the etiology of her left third nerve palsy. She also has a little bit of cerebral edema. I have discontinued her external ventricular drain and put her on oxygen and laid her head down.atient remains awake and alert only deficit remains a left third nerve palsy.

## 2017-04-01 NOTE — Progress Notes (Signed)
Patient ID: Denise Morton, female   DOB: 1950/09/17, 67 y.o.   MRN: [id number] Called to see patient secondary to dilated left pupil. Patient is wide-awake she reports no additional headache says she gets a little bit of a headache when she gets moved around but overall she's had a good day and had not noticed any neurologic changes.  On exam she appears to have a complete left third nerve palsy in addition EVD seems to put out 25 mL over the last hour with some blood-tinged CSF unusual and different from the clear CSF she was draining this morning. She is wide-awake her strength is 5 out of 5 upper and lower extremities her incision is dry  Avoided CT head and CTA of her head emergently. I have clamped her external ventricular drain. We will awaite the results of the CT scan to determine next step.

## 2017-04-01 NOTE — Progress Notes (Signed)
PROGRESS NOTE  Denise Morton  HRC:[id number] DOB: 05/09/1950 DOA: 03/07/2017 PCP: Seward Carol, MD  Brief Narrative:  67 y.o.femaleWith pmhx of BRCA, DM, admitted on 03/07/17 with weakness, dizziness, anorexia, speech difficulties and near syncope. MRI brain demonstrated metastatic lesions to the cerebellum and right occipital areas. She was initially admitted to [facility].  Neurosurgery was consulted and patient was transferred to [facility] on 2/23. She underwent suboccipital craniectomy for resection of right cerebellar meton 2/25 by Dr. Saintclair Halsted.  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  Symptomatically she is feeling much better with improved strength, speech, appetite.  Returned to the OR on 3/6 for correction of her CSF leak underwent reexploration of the suboccipital incision for repair of primary CSF leak utilizing DuraMatrix patch with fascial patch grafts.  She has continued to have a slow CSF leak.  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  Dr. Saintclair Halsted planning to do a stereotactic ventriculostomy placement and primary reexploration of her incision and reclosure on 3/12.    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  - Appreciate Palliative Care consultation -Primary team managing  2)Type 2 diabetes, blood sugars slightly elevated but NPO today for surgery - Plan is to continue current medical regimen  3)hypertension, blood pressure gradually trending down -  Continue Norvasc -  continue labetalol -  Continue lisinopril  -  Continue labetalol and hydralazine prn  4]anemia of chronic disease, hemoglobin improving -   Occult stool -   Continue iron supplementation (do not see that testing was performed)  Leukocytosis, resolved.   DVT prophylaxis:  SCDs  Code Status: Full code Family Communication:  No family at bedside today Disposition Plan:  Ongoing management of CSF leak by neurosurgery.     Consultants:   Neurosurgery  XRT oncology  PM&R  Procedures:  03/13/2017 suboccipital craniectomy for resection of right cerebellar met  03/22/2017:  Wound revision due to CSF leak 03/28/2017:  Stereotactic ventriculostomy placement and primary reexploration of her incision and reclosure on 3/12  Antimicrobials:  Anti-infectives (From admission, onward)   Start     Dose/Rate Route Frequency Ordered Stop   03/30/17 1000  gentamicin (GARAMYCIN) 500 mg in dextrose 5 % 100 mL IVPB     500 mg 112.5 mL/hr over 60 Minutes Intravenous Every 36 hours 03/29/17 1317     03/29/17 0700  vancomycin (VANCOCIN) IVPB 750 mg/150 ml premix     750 mg 150 mL/hr over 60 Minutes Intravenous Every 12 hours 03/28/17 2003     03/28/17 2030  gentamicin (GARAMYCIN) 500 mg in dextrose 5 % 100 mL IVPB  Status:  Discontinued     500 mg 112.5 mL/hr over 60 Minutes Intravenous Every 24 hours 03/28/17 1922 03/29/17 1317   03/28/17 1930  vancomycin (VANCOCIN) IVPB 1000 mg/200 mL premix     1,000 mg 200 mL/hr over 60 Minutes Intravenous  Once 03/28/17 1922 03/28/17 2108   03/28/17 1620  bacitracin 50,000 Units in sodium chloride irrigation 0.9 % 500 mL irrigation  Status:  Discontinued       As needed 03/28/17 1718 03/28/17 1921   03/28/17 1530  vancomycin (VANCOCIN) IVPB 1000 mg/200 mL premix     1,000 mg 200 mL/hr  over 60 Minutes Intravenous  Once 03/28/17 1525 03/28/17 1702   03/28/17 1529  vancomycin (VANCOCIN) 1-5 GM/200ML-% IVPB    Comments:  Henrine Screws   : cabinet override      03/28/17 1529 03/28/17 1602   03/23/17 1200  doxycycline (VIBRA-TABS) tablet 100 mg     100 mg Oral Every 12 hours 03/23/17 0954     03/22/17 2030  vancomycin  (VANCOCIN) IVPB 1000 mg/200 mL premix     1,000 mg 200 mL/hr over 60 Minutes Intravenous Every 12 hours 03/22/17 1912 03/23/17 0939   03/22/17 1428  bacitracin 50,000 Units in sodium chloride irrigation 0.9 % 500 mL irrigation  Status:  Discontinued       As needed 03/22/17 1428 03/22/17 1550   03/20/17 1100  doxycycline (VIBRA-TABS) tablet 100 mg  Status:  Discontinued     100 mg Oral Every 12 hours 03/20/17 1011 03/23/17 0954   03/20/17 0000  doxycycline (VIBRA-TABS) 100 MG tablet     100 mg Oral Every 12 hours 03/20/17 1245     03/13/17 1930  vancomycin (VANCOCIN) IVPB 1000 mg/200 mL premix     1,000 mg 200 mL/hr over 60 Minutes Intravenous Every 12 hours 03/13/17 1836 03/14/17 0756   03/13/17 0858  bacitracin 50,000 Units in sodium chloride irrigation 0.9 % 500 mL irrigation  Status:  Discontinued       As needed 03/13/17 0858 03/13/17 1604   03/13/17 0730  vancomycin (VANCOCIN) IVPB 1000 mg/200 mL premix  Status:  Discontinued     1,000 mg 200 mL/hr over 60 Minutes Intravenous To Surgery 03/13/17 0729 03/13/17 1810       Subjective: Patient reports no new complaints to me   Objective: Vitals:   04/01/17 1400 04/01/17 1430 04/01/17 1500 04/01/17 1600  BP: 122/73 125/75 128/74 107/79  Pulse: 80 75 66   Resp: 17 15 15    Temp:    97.7 F (36.5 C)  TempSrc:    Axillary  SpO2: 94% 97% 96%   Weight:      Height:        Intake/Output Summary (Last 24 hours) at 04/01/2017 1740 Last data filed at 04/01/2017 1700 Gross per 24 hour  Intake 652.5 ml  Output 528 ml  Net 124.5 ml   Filed Weights   03/30/17 0500 03/31/17 0400 04/01/17 0500  Weight: 103.1 kg (227 lb 4.7 oz) 104.5 kg (230 lb 6.1 oz) 103.5 kg (228 lb 2.8 oz)    Examination:  Exam unchanged when compared to prior on 03/31/17  General exam:  Pt in nad, alert and awake HEENT:  MMM, posterior scalp craniocaudal incision in the midline with staples.  Edges well approximated, no induration or erythema.  She continues  to have serous fluid leaking from the inferior portion of her incision with a saturated dressing at the inferior portion. Respiratory system: Clear to auscultation bilaterally, equal chest rise. Cardiovascular system: Regular rate and rhythm, normal S1/S2. No murmurs, rubs, gallops or clicks.  Warm extremities Gastrointestinal system: Normal active bowel sounds, soft, nondistended, nontender. MSK:  Normal tone and bulk, no lower extremity edema Neuro:  Grossly intact   Data Reviewed: I have personally reviewed following labs and imaging studies  CBC: Recent Labs  Lab 03/28/17 0349 03/29/17 0428 03/30/17 0425 03/31/17 0336 04/01/17 0621  WBC 9.0 9.3 8.0 7.0 5.4  HGB 9.7* 9.9* 9.6* 9.9* 9.4*  HCT 31.0* 30.4* 30.4* 31.2* 29.8*  MCV 99.4 100.0 100.3* 101.3* 100.7*  PLT 141*  127* 117* 108* 517*   Basic Metabolic Panel: Recent Labs  Lab 03/28/17 0349 03/29/17 0428 03/30/17 0425 03/31/17 0336 04/01/17 0621  NA 134* 137 137 136 136  K 4.3 5.0 4.7 4.7 4.5  CL 103 105 105 106 103  CO2 24 23 23  20* 25  GLUCOSE 241* 178* 214* 188* 162*  BUN 27* 21* 31* 41* 33*  CREATININE 0.69 0.60 0.69 0.89 0.77  CALCIUM 8.6* 8.4* 8.5* 8.7* 8.7*   GFR: Estimated Creatinine Clearance: 84.1 mL/min (by C-G formula based on SCr of 0.77 mg/dL). Liver Function Tests: No results for input(s): AST, ALT, ALKPHOS, BILITOT, PROT, ALBUMIN in the last 168 hours. No results for input(s): LIPASE, AMYLASE in the last 168 hours. No results for input(s): AMMONIA in the last 168 hours. Coagulation Profile: No results for input(s): INR, PROTIME in the last 168 hours. Cardiac Enzymes: No results for input(s): CKTOTAL, CKMB, CKMBINDEX, TROPONINI in the last 168 hours. BNP (last 3 results) No results for input(s): PROBNP in the last 8760 hours. HbA1C: No results for input(s): HGBA1C in the last 72 hours. CBG: Recent Labs  Lab 03/31/17 2328 04/01/17 0332 04/01/17 0824 04/01/17 1150 04/01/17 1655  GLUCAP  230* 190* 164* 268* 159*   Lipid Profile: No results for input(s): CHOL, HDL, LDLCALC, TRIG, CHOLHDL, LDLDIRECT in the last 72 hours. Thyroid Function Tests: No results for input(s): TSH, T4TOTAL, FREET4, T3FREE, THYROIDAB in the last 72 hours. Anemia Panel: No results for input(s): VITAMINB12, FOLATE, FERRITIN, TIBC, IRON, RETICCTPCT in the last 72 hours. Urine analysis: No results found for: COLORURINE, APPEARANCEUR, LABSPEC, PHURINE, GLUCOSEU, HGBUR, BILIRUBINUR, KETONESUR, PROTEINUR, UROBILINOGEN, NITRITE, LEUKOCYTESUR Sepsis Labs: @LABRCNTIP (procalcitonin:4,lacticidven:4)  ) Recent Results (from the past 240 hour(s))  CSF culture with Stat gram stain     Status: None (Preliminary result)   Collection Time: 04/01/17 10:24 AM  Result Value Ref Range Status   Specimen Description CSF DRAWN FROM IVC  Final   Special Requests Normal  Final   Gram Stain   Final    FEW WBC PRESENT,BOTH PMN AND MONONUCLEAR NO ORGANISMS SEEN Performed at [facility], [address] [place], [place] [address]    Culture PENDING  Incomplete   Report Status PENDING  Incomplete      Radiology Studies: No results found.   Scheduled Meds: . amLODipine  10 mg Oral Daily  . Chlorhexidine Gluconate Cloth  6 each Topical Q0600  . dexamethasone  2 mg Oral Q6H  . docusate sodium  100 mg Oral BID  . doxycycline  100 mg Oral Q12H  . feeding supplement (GLUCERNA SHAKE)  237 mL Oral BID BM  . ferrous sulfate  325 mg Oral BID WC  . insulin aspart  0-20 Units Subcutaneous TID WC  . insulin aspart  0-5 Units Subcutaneous QHS  . insulin aspart  10 Units Subcutaneous TID WC  . insulin glargine  24 Units Subcutaneous QHS  . labetalol  200 mg Oral TID  . levETIRAcetam  500 mg Oral BID  . lisinopril  5 mg Oral Daily  . mirtazapine  15 mg Oral QHS  . nystatin-triamcinolone   Topical BID  . pantoprazole  40 mg Oral QHS  . polyethylene glycol  17 g Oral Daily  . senna  1 tablet Oral BID  . sodium  chloride flush  10-40 mL Intracatheter Q12H   Continuous Infusions: . sodium chloride 10 mL/hr at 04/01/17 1700  . gentamicin Stopped (03/31/17 2338)  . vancomycin 750 mg (04/01/17  0600)     LOS: 25 days    Time spent: 15 min  Velvet Bathe, MD [facility] Pager [phone]  If 7PM-7AM, please contact night-coverage [email or web address] Password [facility] 04/01/2017, 5:40 PM

## 2017-04-02 LAB — BASIC METABOLIC PANEL
Anion gap: 6 (ref 5–15)
BUN: 30 mg/dL — AB (ref 6–20)
CALCIUM: 8.7 mg/dL — AB (ref 8.9–10.3)
CHLORIDE: 105 mmol/L (ref 101–111)
CO2: 27 mmol/L (ref 22–32)
CREATININE: 0.74 mg/dL (ref 0.44–1.00)
GFR calc Af Amer: >60 mL/min (ref >60)
GFR calc non Af Amer: >60 mL/min (ref >60)
GLUCOSE: 91 mg/dL (ref 65–99)
Potassium: 4.5 mmol/L (ref 3.5–5.1)
Sodium: 138 mmol/L (ref 135–145)

## 2017-04-02 LAB — CBC
HCT: 27.7 % — ABNORMAL LOW (ref 36.0–46.0)
Hemoglobin: 8.9 g/dL — ABNORMAL LOW (ref 12.0–15.0)
MCH: 32.4 pg (ref 26.0–34.0)
MCHC: 32.1 g/dL (ref 30.0–36.0)
MCV: 100.7 fL — AB (ref 78.0–100.0)
PLATELETS: 98 10*3/uL — AB (ref 150–400)
RBC: 2.75 MIL/uL — ABNORMAL LOW (ref 3.87–5.11)
RDW: 18.9 % — AB (ref 11.5–15.5)
WBC: 5.5 10*3/uL (ref 4.0–10.5)

## 2017-04-02 LAB — GENTAMICIN LEVEL, RANDOM: GENTAMICIN RM: 1.2 ug/mL

## 2017-04-02 LAB — GLUCOSE, CAPILLARY
Glucose-Capillary: 106 mg/dL — ABNORMAL HIGH (ref 65–99)
Glucose-Capillary: 215 mg/dL — ABNORMAL HIGH (ref 65–99)
Glucose-Capillary: 119 mg/dL — ABNORMAL HIGH (ref 65–99)
Glucose-Capillary: 173 mg/dL — ABNORMAL HIGH (ref 65–99)

## 2017-04-02 NOTE — Progress Notes (Signed)
Subjective: Patient reports patient doing better this morning less headache  Objective: Vital signs in last 24 hours: Temp:  [97.7 F (36.5 C)-98.6 F (37 C)] 98.6 F (37 C) (03/17 0000) Pulse Rate:  [55-84] 66 (03/17 0430) Resp:  [8-28] 15 (03/17 0430) BP: (100-161)/(57-131) 160/79 (03/17 0430) SpO2:  [92 %-100 %] 100 % (03/17 0430)  Intake/Output from previous day: 03/16 0701 - 03/17 0700 In: 120 [I.V.:120] Out: 832 [Urine:700; Drains:132] Intake/Output this shift: Total I/O In: -  Out: 350 [Urine:350]  awake alert remains with left third nerve palsy strength 5 out of 5 upper and lower extremities incision clean dry and intact  Lab Results: Recent Labs    03/31/17 0336 04/01/17 0621  WBC 7.0 5.4  HGB 9.9* 9.4*  HCT 31.2* 29.8*  PLT 108* 109*   BMET Recent Labs    03/31/17 0336 04/01/17 0621  NA 136 136  K 4.7 4.5  CL 106 103  CO2 20* 25  GLUCOSE 188* 162*  BUN 41* 33*  CREATININE 0.89 0.77  CALCIUM 8.7* 8.7*    Studies/Results: Ct Angio Head W Or Wo Contrast  Result Date: 04/01/2017 CLINICAL DATA:  67 year old female status post suboccipital craniectomy for debulking of cerebellar metastasis. 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 Increasing blood tinged CSF from external ventricular drain. EXAM: CT ANGIOGRAPHY HEAD TECHNIQUE: Multidetector CT imaging of the head was performed using the standard protocol during bolus administration of intravenous contrast. Multiplanar CT image reconstructions and MIPs were obtained to evaluate the vascular anatomy. CONTRAST:  98mL ISOVUE-370 IOPAMIDOL (ISOVUE-370) INJECTION 76% COMPARISON:  Postoperative head CT without contrast 03/27/2017, and earlier. FINDINGS: CT HEAD Brain: Moderate to large volume pneumocephalus is new since 03/27/2017 with mass effect on the anterior frontal lobes (series 4 image 13). Associated partial effacement of the basilar cisterns compared to 03/27/2017 (sagittal series 8, image 28). Scattered other  pneumocephalus including along the tentorium and also in the basilar cisterns abutting cavernous sinus more so on the left (series 6, image 35). New right frontal approach EVD in place and appears to course through the right lateral ventricle terminating within or adjacent to the posterior margin of the 3rd ventricle on series 4, image 14. Decreased lateral ventricle size since 03/27/2017. The 3rd and 4th ventricle size is not significantly changed. No gas identified within the ventricles. No acute intracranial hemorrhage identified. Small volume low-density extra-axial collections persist at the vertex, but appear decreased. Postoperative changes to the posterior fossa and right cerebellar hemisphere appear stable. Mild residual edema suspected in the right cerebellar hemisphere. The suboccipital fluid collection persists and now contains a small volume of gas (sagittal series 8, image 25). At the level of the foramen magnum the fluid collection size appears mildly decreased (4 centimeters largest dimension versus 5.3 centimeters previously), although more caudal extent of the fluid is visible today. No acute cortically based infarct identified. Additionally, there appears to be epidural venous prominence in the visible upper cervical spine (series 4, image 1) which enhances following contrast (series 18, image 2). Skull: Suboccipital craniectomy changes. Right frontal bone burr hole placement is new since 03/27/2017. There is a small volume of gas within the burr hole along the EVD (series 5, image 54). No skull fracture identified. Sinuses: Visualized paranasal sinuses and mastoids are stable and well pneumatized. Orbits: Visualized orbit soft tissues are within normal limits. Mild postoperative soft tissue changes along the right superior scalp at the EVD site. No subcutaneous gas. CTA HEAD Posterior circulation: Patent distal  vertebral arteries without stenosis. The right appears mildly dominant. Both PICA  origins are patent. Patent vertebrobasilar junction and basilar artery. No basilar stenosis. The basilar tip, SCA and right PCA origins appear normal. There is a fetal type left PCA origin. Both posterior communicating arteries appear within normal limits, the right is diminutive. The bilateral PCA branches are within normal limits. Anterior circulation: Patent cervical ICAs with mild tortuosity on the left. Both ICA siphons are patent. There is mild to moderate bilateral siphon calcified plaque. No hemodynamically significant siphon stenosis on the right. There is moderate left ICA siphon stenosis in both the cavernous and proximal supraclinoid segments due to plaque. Both ophthalmic and posterior communicating artery origins appear normal. Patent carotid termini. Normal MCA and ACA origins. Bilateral ACA branches are within normal limits. The left MCA M1 segment, bifurcation, and left MCA branches are within normal limits. The right MCA M1 segment, bifurcation, and right MCA branches are within normal limits. Venous sinuses: Patent on the delayed images. Anatomic variants: Fetal type left PCA origin. Delayed phase: Decreased enhancement along the posterior right cerebellar resection margin compared to the February MRI, although this may be related to differences in modality. No abnormal intracranial enhancement identified. Incidentally, increased Upper cervical spine epidural venous engorgement and enhancement is noted (series 18, image 1). IMPRESSION: 1. No intracranial aneurysm or acute arterial abnormality identified. Moderate left ICA siphon stenosis due to calcified plaque. 2. New moderate to large volume pneumocephalus with mass effect on the anterior frontal lobes and mild effacement of the basilar cisterns since 03/27/2017. 3. Right frontal approach EVD in place with interval decreased lateral ventricle size. Small volume subdural hygromas remain visible at the vertex. 4. Right suboccipital craniectomy and  right cerebellar resection with perhaps mildly decreased postoperative pseudomeningocele since 03/27/2017. I note that this fluid collection also now contains some gas. Salient findings on this exam were discussed by telephone with Dr. Kary Kos on 04/01/2017 1852 hours. Electronically Signed   By: Genevie Ann M.D.   On: 04/01/2017 19:09    Assessment/Plan: Patient will this morning has been recumbent head 30 on high flow oxygen. Incision clean dry and intact no evidence of CSF leak slow mobilization throughout the day today repeat CT in the morning.  LOS: 26 days     Drayden Lukas P 04/02/2017, 5:53 AM

## 2017-04-02 NOTE — Progress Notes (Signed)
PROGRESS NOTE  Denise Morton  XAJ:[id number] DOB: 02/08/1950 DOA: 03/07/2017 PCP: Seward Carol, MD  Brief Narrative:  67 y.o.femaleWith pmhx of BRCA, DM, admitted on 03/07/17 with weakness, dizziness, anorexia, speech difficulties and near syncope. MRI brain demonstrated metastatic lesions to the cerebellum and right occipital areas. She was initially admitted to [facility].  Neurosurgery was consulted and patient was transferred to [facility] on 2/23. She underwent suboccipital craniectomy for resection of right cerebellar meton 2/25 by Dr. Saintclair Halsted.  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  Symptomatically she is feeling much better with improved strength, speech, appetite.  Returned to the OR on 3/6 for correction of her CSF leak underwent reexploration of the suboccipital incision for repair of primary CSF leak utilizing DuraMatrix patch with fascial patch grafts.  She has continued to have a slow CSF leak.  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  Dr. Saintclair Halsted planning to do a stereotactic ventriculostomy placement and primary reexploration of her incision and reclosure on 3/12.    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  - Appreciate Palliative Care consultation -Primary team managing  2)Type 2 diabetes, blood sugars slightly elevated but NPO today for surgery - continue current plan.  3)hypertension, blood pressure gradually trending down Stable continue following. -  Continue Norvasc -  continue labetalol -  Continue lisinopril  -  Continue labetalol and hydralazine prn  4]anemia of chronic disease, hemoglobin improving -   Occult  stool -  Continue iron supplementation (do not see that testing was performed)  Leukocytosis, resolved.   DVT prophylaxis:  SCDs  Code Status: Full code Family Communication:  No family at bedside today Disposition Plan:  Ongoing management of CSF leak by neurosurgery.     Consultants:   Neurosurgery  XRT oncology  PM&R  Procedures:  03/13/2017 suboccipital craniectomy for resection of right cerebellar met  03/22/2017:  Wound revision due to CSF leak 03/28/2017:  Stereotactic ventriculostomy placement and primary reexploration of her incision and reclosure on 3/12  Antimicrobials:  Anti-infectives (From admission, onward)   Start     Dose/Rate Route Frequency Ordered Stop   03/30/17 1000  gentamicin (GARAMYCIN) 500 mg in dextrose 5 % 100 mL IVPB     500 mg 112.5 mL/hr over 60 Minutes Intravenous Every 36 hours 03/29/17 1317     03/29/17 0700  vancomycin (VANCOCIN) IVPB 750 mg/150 ml premix     750 mg 150 mL/hr over 60 Minutes Intravenous Every 12 hours 03/28/17 2003     03/28/17 2030  gentamicin (GARAMYCIN) 500 mg in dextrose 5 % 100 mL IVPB  Status:  Discontinued     500 mg 112.5 mL/hr over 60 Minutes Intravenous Every 24 hours 03/28/17 1922 03/29/17 1317   03/28/17 1930  vancomycin (VANCOCIN) IVPB 1000 mg/200 mL premix     1,000 mg 200 mL/hr over 60 Minutes Intravenous  Once 03/28/17 1922 03/28/17 2108   03/28/17 1620  bacitracin 50,000 Units in sodium chloride irrigation 0.9 % 500 mL irrigation  Status:  Discontinued       As needed 03/28/17 1718 03/28/17 1921   03/28/17 1530  vancomycin (VANCOCIN) IVPB 1000 mg/200 mL premix     1,000 mg 200 mL/hr over  60 Minutes Intravenous  Once 03/28/17 1525 03/28/17 1702   03/28/17 1529  vancomycin (VANCOCIN) 1-5 GM/200ML-% IVPB    Comments:  Henrine Screws   : cabinet override      03/28/17 1529 03/28/17 1602   03/23/17 1200  doxycycline (VIBRA-TABS) tablet 100 mg     100 mg Oral Every 12 hours 03/23/17 0954     03/22/17 2030   vancomycin (VANCOCIN) IVPB 1000 mg/200 mL premix     1,000 mg 200 mL/hr over 60 Minutes Intravenous Every 12 hours 03/22/17 1912 03/23/17 0939   03/22/17 1428  bacitracin 50,000 Units in sodium chloride irrigation 0.9 % 500 mL irrigation  Status:  Discontinued       As needed 03/22/17 1428 03/22/17 1550   03/20/17 1100  doxycycline (VIBRA-TABS) tablet 100 mg  Status:  Discontinued     100 mg Oral Every 12 hours 03/20/17 1011 03/23/17 0954   03/20/17 0000  doxycycline (VIBRA-TABS) 100 MG tablet     100 mg Oral Every 12 hours 03/20/17 1245     03/13/17 1930  vancomycin (VANCOCIN) IVPB 1000 mg/200 mL premix     1,000 mg 200 mL/hr over 60 Minutes Intravenous Every 12 hours 03/13/17 1836 03/14/17 0756   03/13/17 0858  bacitracin 50,000 Units in sodium chloride irrigation 0.9 % 500 mL irrigation  Status:  Discontinued       As needed 03/13/17 0858 03/13/17 1604   03/13/17 0730  vancomycin (VANCOCIN) IVPB 1000 mg/200 mL premix  Status:  Discontinued     1,000 mg 200 mL/hr over 60 Minutes Intravenous To Surgery 03/13/17 0729 03/13/17 1810       Subjective: No complaints reported.   Objective: Vitals:   04/02/17 1330 04/02/17 1400 04/02/17 1500 04/02/17 1550  BP: 109/85 110/83 135/88 135/88  Pulse: 74 66 61 71  Resp: (!) 21 15 13    Temp:      TempSrc:      SpO2: 100% 100% 100%   Weight:      Height:        Intake/Output Summary (Last 24 hours) at 04/02/2017 1613 Last data filed at 04/02/2017 1604 Gross per 24 hour  Intake 652.5 ml  Output 1585 ml  Net -932.5 ml   Filed Weights   03/30/17 0500 03/31/17 0400 04/01/17 0500  Weight: 103.1 kg (227 lb 4.7 oz) 104.5 kg (230 lb 6.1 oz) 103.5 kg (228 lb 2.8 oz)    Examination:  Exam unchanged when compared to prior on 04/01/17  General exam:  Pt in nad, alert and awake HEENT:  MMM, posterior scalp craniocaudal incision in the midline with staples.  Edges well approximated, no induration or erythema.  She continues to have serous fluid  leaking from the inferior portion of her incision with a saturated dressing at the inferior portion. Respiratory system: Clear to auscultation bilaterally, equal chest rise. Cardiovascular system: Regular rate and rhythm, normal S1/S2. No murmurs, rubs, gallops or clicks.  Warm extremities Gastrointestinal system: Normal active bowel sounds, soft, nondistended, nontender. MSK:  Normal tone and bulk, no lower extremity edema Neuro:  Grossly intact   Data Reviewed: I have personally reviewed following labs and imaging studies  CBC: Recent Labs  Lab 03/29/17 0428 03/30/17 0425 03/31/17 0336 04/01/17 0621 04/02/17 0500  WBC 9.3 8.0 7.0 5.4 5.5  HGB 9.9* 9.6* 9.9* 9.4* 8.9*  HCT 30.4* 30.4* 31.2* 29.8* 27.7*  MCV 100.0 100.3* 101.3* 100.7* 100.7*  PLT 127* 117* 108* 109* 98*   Basic  Metabolic Panel: Recent Labs  Lab 03/29/17 0428 03/30/17 0425 03/31/17 0336 04/01/17 0621 04/02/17 0500  NA 137 137 136 136 138  K 5.0 4.7 4.7 4.5 4.5  CL 105 105 106 103 105  CO2 23 23 20* 25 27  GLUCOSE 178* 214* 188* 162* 91  BUN 21* 31* 41* 33* 30*  CREATININE 0.60 0.69 0.89 0.77 0.74  CALCIUM 8.4* 8.5* 8.7* 8.7* 8.7*   GFR: Estimated Creatinine Clearance: 84.1 mL/min (by C-G formula based on SCr of 0.74 mg/dL). Liver Function Tests: No results for input(s): AST, ALT, ALKPHOS, BILITOT, PROT, ALBUMIN in the last 168 hours. No results for input(s): LIPASE, AMYLASE in the last 168 hours. No results for input(s): AMMONIA in the last 168 hours. Coagulation Profile: No results for input(s): INR, PROTIME in the last 168 hours. Cardiac Enzymes: No results for input(s): CKTOTAL, CKMB, CKMBINDEX, TROPONINI in the last 168 hours. BNP (last 3 results) No results for input(s): PROBNP in the last 8760 hours. HbA1C: No results for input(s): HGBA1C in the last 72 hours. CBG: Recent Labs  Lab 04/01/17 1150 04/01/17 1655 04/01/17 2147 04/02/17 0800 04/02/17 1306  GLUCAP 268* 159* 111* 106* 215*    Lipid Profile: No results for input(s): CHOL, HDL, LDLCALC, TRIG, CHOLHDL, LDLDIRECT in the last 72 hours. Thyroid Function Tests: No results for input(s): TSH, T4TOTAL, FREET4, T3FREE, THYROIDAB in the last 72 hours. Anemia Panel: No results for input(s): VITAMINB12, FOLATE, FERRITIN, TIBC, IRON, RETICCTPCT in the last 72 hours. Urine analysis: No results found for: COLORURINE, APPEARANCEUR, LABSPEC, PHURINE, GLUCOSEU, HGBUR, BILIRUBINUR, KETONESUR, PROTEINUR, UROBILINOGEN, NITRITE, LEUKOCYTESUR Sepsis Labs: @LABRCNTIP (procalcitonin:4,lacticidven:4)  ) Recent Results (from the past 240 hour(s))  CSF culture with Stat gram stain     Status: None (Preliminary result)   Collection Time: 04/01/17 10:24 AM  Result Value Ref Range Status   Specimen Description CSF DRAWN FROM IVC  Final   Special Requests Normal  Final   Gram Stain   Final    FEW WBC PRESENT,BOTH PMN AND MONONUCLEAR NO ORGANISMS SEEN    Culture   Final    NO GROWTH 1 DAY Performed at [facility], [place] [address], [place], [place] [address]    Report Status PENDING  Incomplete      Radiology Studies: Ct Angio Head W Or Wo Contrast  Result Date: 04/01/2017 CLINICAL DATA:  67 year old female status post suboccipital craniectomy for debulking of cerebellar metastasis. 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 Increasing blood tinged CSF from external ventricular drain. EXAM: CT ANGIOGRAPHY HEAD TECHNIQUE: Multidetector CT imaging of the head was performed using the standard protocol during bolus administration of intravenous contrast. Multiplanar CT image reconstructions and MIPs were obtained to evaluate the vascular anatomy. CONTRAST:  10m ISOVUE-370 IOPAMIDOL (ISOVUE-370) INJECTION 76% COMPARISON:  Postoperative head CT without contrast 03/27/2017, and earlier. FINDINGS: CT HEAD Brain: Moderate to large volume pneumocephalus is new since 03/27/2017 with mass effect on the anterior frontal lobes (series 4 image 13).  Associated partial effacement of the basilar cisterns compared to 03/27/2017 (sagittal series 8, image 28). Scattered other pneumocephalus including along the tentorium and also in the basilar cisterns abutting cavernous sinus more so on the left (series 6, image 35). New right frontal approach EVD in place and appears to course through the right lateral ventricle terminating within or adjacent to the posterior margin of the 3rd ventricle on series 4, image 14. Decreased lateral ventricle size since 03/27/2017. The 3rd and 4th ventricle size is not significantly  changed. No gas identified within the ventricles. No acute intracranial hemorrhage identified. Small volume low-density extra-axial collections persist at the vertex, but appear decreased. Postoperative changes to the posterior fossa and right cerebellar hemisphere appear stable. Mild residual edema suspected in the right cerebellar hemisphere. The suboccipital fluid collection persists and now contains a small volume of gas (sagittal series 8, image 25). At the level of the foramen magnum the fluid collection size appears mildly decreased (4 centimeters largest dimension versus 5.3 centimeters previously), although more caudal extent of the fluid is visible today. No acute cortically based infarct identified. Additionally, there appears to be epidural venous prominence in the visible upper cervical spine (series 4, image 1) which enhances following contrast (series 18, image 2). Skull: Suboccipital craniectomy changes. Right frontal bone burr hole placement is new since 03/27/2017. There is a small volume of gas within the burr hole along the EVD (series 5, image 54). No skull fracture identified. Sinuses: Visualized paranasal sinuses and mastoids are stable and well pneumatized. Orbits: Visualized orbit soft tissues are within normal limits. Mild postoperative soft tissue changes along the right superior scalp at the EVD site. No subcutaneous gas. CTA HEAD  Posterior circulation: Patent distal vertebral arteries without stenosis. The right appears mildly dominant. Both PICA origins are patent. Patent vertebrobasilar junction and basilar artery. No basilar stenosis. The basilar tip, SCA and right PCA origins appear normal. There is a fetal type left PCA origin. Both posterior communicating arteries appear within normal limits, the right is diminutive. The bilateral PCA branches are within normal limits. Anterior circulation: Patent cervical ICAs with mild tortuosity on the left. Both ICA siphons are patent. There is mild to moderate bilateral siphon calcified plaque. No hemodynamically significant siphon stenosis on the right. There is moderate left ICA siphon stenosis in both the cavernous and proximal supraclinoid segments due to plaque. Both ophthalmic and posterior communicating artery origins appear normal. Patent carotid termini. Normal MCA and ACA origins. Bilateral ACA branches are within normal limits. The left MCA M1 segment, bifurcation, and left MCA branches are within normal limits. The right MCA M1 segment, bifurcation, and right MCA branches are within normal limits. Venous sinuses: Patent on the delayed images. Anatomic variants: Fetal type left PCA origin. Delayed phase: Decreased enhancement along the posterior right cerebellar resection margin compared to the February MRI, although this may be related to differences in modality. No abnormal intracranial enhancement identified. Incidentally, increased Upper cervical spine epidural venous engorgement and enhancement is noted (series 18, image 1). IMPRESSION: 1. No intracranial aneurysm or acute arterial abnormality identified. Moderate left ICA siphon stenosis due to calcified plaque. 2. New moderate to large volume pneumocephalus with mass effect on the anterior frontal lobes and mild effacement of the basilar cisterns since 03/27/2017. 3. Right frontal approach EVD in place with interval decreased  lateral ventricle size. Small volume subdural hygromas remain visible at the vertex. 4. Right suboccipital craniectomy and right cerebellar resection with perhaps mildly decreased postoperative pseudomeningocele since 03/27/2017. I note that this fluid collection also now contains some gas. Salient findings on this exam were discussed by telephone with Dr. Kary Kos on 04/01/2017 1852 hours. Electronically Signed   By: Genevie Ann M.D.   On: 04/01/2017 19:09     Scheduled Meds: . amLODipine  10 mg Oral Daily  . Chlorhexidine Gluconate Cloth  6 each Topical Q0600  . dexamethasone  2 mg Oral Q6H  . docusate sodium  100 mg Oral BID  . doxycycline  100  mg Oral Q12H  . feeding supplement (GLUCERNA SHAKE)  237 mL Oral BID BM  . ferrous sulfate  325 mg Oral BID WC  . insulin aspart  0-20 Units Subcutaneous TID WC  . insulin aspart  0-5 Units Subcutaneous QHS  . insulin aspart  10 Units Subcutaneous TID WC  . insulin glargine  24 Units Subcutaneous QHS  . labetalol  200 mg Oral TID  . levETIRAcetam  500 mg Oral BID  . lisinopril  5 mg Oral Daily  . mirtazapine  15 mg Oral QHS  . nystatin-triamcinolone   Topical BID  . pantoprazole  40 mg Oral QHS  . polyethylene glycol  17 g Oral Daily  . senna  1 tablet Oral BID  . sodium chloride flush  10-40 mL Intracatheter Q12H   Continuous Infusions: . sodium chloride 10 mL/hr at 04/02/17 1600  . gentamicin Stopped (04/02/17 1008)  . vancomycin Stopped (04/02/17 0700)     LOS: 26 days    Time spent: 15 min  Velvet Bathe, MD [facility] Pager [phone]  If 7PM-7AM, please contact night-coverage [email or web address] Password [email or web address] 04/02/2017, 4:13 PM

## 2017-04-03 ENCOUNTER — Inpatient Hospital Stay (HOSPITAL_COMMUNITY): Payer: PPO

## 2017-04-03 LAB — BASIC METABOLIC PANEL
Anion gap: 9 (ref 5–15)
BUN: 28 mg/dL — AB (ref 6–20)
CALCIUM: 8.8 mg/dL — AB (ref 8.9–10.3)
CO2: 28 mmol/L (ref 22–32)
CREATININE: 0.71 mg/dL (ref 0.44–1.00)
Chloride: 100 mmol/L — ABNORMAL LOW (ref 101–111)
GFR calc Af Amer: >60 mL/min (ref >60)
GLUCOSE: 159 mg/dL — AB (ref 65–99)
Potassium: 4.5 mmol/L (ref 3.5–5.1)
Sodium: 137 mmol/L (ref 135–145)

## 2017-04-03 LAB — CBC
HEMATOCRIT: 28.1 % — AB (ref 36.0–46.0)
Hemoglobin: 8.7 g/dL — ABNORMAL LOW (ref 12.0–15.0)
MCH: 31.2 pg (ref 26.0–34.0)
MCHC: 31 g/dL (ref 30.0–36.0)
MCV: 100.7 fL — ABNORMAL HIGH (ref 78.0–100.0)
Platelets: 101 10*3/uL — ABNORMAL LOW (ref 150–400)
RBC: 2.79 MIL/uL — ABNORMAL LOW (ref 3.87–5.11)
RDW: 18.8 % — AB (ref 11.5–15.5)
WBC: 4.9 10*3/uL (ref 4.0–10.5)

## 2017-04-03 LAB — GLUCOSE, CAPILLARY
Glucose-Capillary: 137 mg/dL — ABNORMAL HIGH (ref 65–99)
Glucose-Capillary: 171 mg/dL — ABNORMAL HIGH (ref 65–99)
Glucose-Capillary: 107 mg/dL — ABNORMAL HIGH (ref 65–99)
Glucose-Capillary: 191 mg/dL — ABNORMAL HIGH (ref 65–99)

## 2017-04-03 NOTE — Social Work (Signed)
Pt has moved to 4NP, aware that SNF is still the recommendation.   CSW continuing to follow.  Alexander Mt, [place] Work [phone]

## 2017-04-03 NOTE — Progress Notes (Signed)
Physical Therapy Treatment Patient Details Name: Denise Morton MRN: [id number] DOB: 05-26-1950 Today's Date: 04/03/2017    History of Present Illness  Denise Morton is a 67 y.o. 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  PMHx of Breast CA, DM, central retinal vein occlusion right eye    PT Comments    Pt pleasant with willingness to mobilize. Pt with dilated left pupil, constricted right and notable change in coordination of eye movement and already impaired vision. Pt with decreased cognition this session with difficulty recalling animals with the same letter, decreased safety and functional awareness. Pt with slowly progressing gait with 2 person assist and able to walk 25' which fatigued pt rapidly. Pt encouraged to continue to mobilize daily with nursing and perform HEP. Will continue to follow.       Follow Up Recommendations  Supervision/Assistance - 24 hour;SNF     Equipment Recommendations  Rolling walker with 5" wheels    Recommendations for Other Services       Precautions / Restrictions Precautions Precautions: Fall    Mobility  Bed Mobility Overal bed mobility: Needs Assistance Bed Mobility: Supine to Sit     Supine to sit: Min assist;HOB elevated     General bed mobility comments: min assist to elevate trunk from surface, HOB 30, increased time, use of rail, assist to move legs to initiate transition to EOB, cues for sequence  Transfers Overall transfer level: Needs assistance   Transfers: Sit to/from Stand Sit to Stand: Mod assist;+2 physical assistance         General transfer comment: mod assist to stand from bed and chair with 2 person assist, cues for sequence and physical assist to  rise x 3 trials, cues for hand placement. pt does not control descent to surface  Ambulation/Gait Ambulation/Gait assistance: Min assist;Mod assist Ambulation Distance (Feet): 25 Feet Assistive device: Rolling walker (2 wheeled) Gait Pattern/deviations: Step-to pattern;Decreased stride length;Wide base of support;Trunk flexed   Gait velocity interpretation: Below normal speed for age/gender General Gait Details: pt with step to pattern, tendency to step out to the right of the RW, cues and assist for sequence, balance and safety with chair to follow. Initial mod assist for gait with progression to min +2 with chair for safety. performed gait x 2 with 25' then 8'   Stairs            Wheelchair Mobility    Modified Rankin (Stroke Patients Only)       Balance Overall balance assessment: Needs assistance   Sitting balance-Leahy Scale: Fair     Standing balance support: Bilateral upper extremity supported Standing balance-Leahy Scale: Poor Standing balance comment: requires UE support                             Cognition Arousal/Alertness: Awake/alert Behavior During Therapy: WFL for tasks assessed/performed Overall Cognitive Status: Impaired/Different from baseline Area of Impairment: Safety/judgement;Problem solving;Orientation;Memory                   Current Attention Level: Sustained Memory: Decreased short-term memory Following Commands: Follows one step commands consistently Safety/Judgement: Decreased awareness of deficits;Decreased awareness of safety   Problem Solving: Difficulty sequencing;Slow  processing;Decreased initiation General Comments: pt unable to name 3 animals today that start with the same letter which she previously has done each session. Pt with inability to recall activity over the weekend      Exercises      General Comments        Pertinent Vitals/Pain Pain Assessment: No/denies pain    Home Living                       Prior Function            PT Goals (current goals can now be found in the care plan section) Progress towards PT goals: Progressing toward goals(slow progression with general decline from 3/12)    Frequency    Min 3X/week      PT Plan Current plan remains appropriate    Co-evaluation PT/OT/SLP Co-Evaluation/Treatment: Yes Reason for Co-Treatment: Complexity of the patient's impairments (multi-system involvement);For patient/therapist safety PT goals addressed during session: Mobility/safety with mobility;Balance;Proper use of DME        AM-PAC PT "6 Clicks" Daily Activity  Outcome Measure  Difficulty turning over in bed (including adjusting bedclothes, sheets and blankets)?: A Lot Difficulty moving from lying on back to sitting on the side of the bed? : Unable Difficulty sitting down on and standing up from a chair with arms (e.g., wheelchair, bedside commode, etc,.)?: Unable Help needed moving to and from a bed to chair (including a wheelchair)?: A Lot Help needed walking in hospital room?: A Lot Help needed climbing 3-5 steps with a railing? : Total 6 Click Score: 9    End of Session Equipment Utilized During Treatment: Gait belt Activity Tolerance: Patient tolerated treatment well Patient left: in chair;with call bell/phone within reach;with chair alarm set Nurse Communication: Mobility status;Precautions PT Visit Diagnosis: Other symptoms and signs involving the nervous system (R29.898);Muscle weakness (generalized) (M62.81);Other abnormalities of gait and mobility (R26.89)     Time: 6644-0347 PT Time Calculation (min) (ACUTE ONLY): 29 min  Charges:  $Gait Training: 8-22 mins                    G Codes:       Elwyn Reach, PT [phone]    [place] 04/03/2017, 2:05 PM

## 2017-04-03 NOTE — Progress Notes (Signed)
1900: Handoff report received from RN. Pt resting in bed, son visiting. Discussed plan of care for the shift; pt amenable to plan. Discussed recent disruption in support system as pt's mother passed away; pt coping well.   2200: Pt's pillow case wet with a clear fluid. Case changed and absorbent pad placed.  0000: Pt resting comfortably.  0400: Pt continues resting comfortably.  0700: Handoff report given to RN. No acute events overnight.

## 2017-04-03 NOTE — Progress Notes (Addendum)
Original rehab consult completed 03/15/2017 and patient decided for SNF for she had no assistance at home. I met with pt at bedside today, and she said that she had thought about requesting CIR admit for she had planned for her Mom to provide supervision at d/c. BUT, her Mom died yesterday. Pt's son from [place] is in town to assist with funeral and son from [place] is local. But pt continues to have limited caregiver support at home and limited ability per PT and OT for more intense therapies. SNF continues to be rehab recommendation at this time. I have discussed with SW.  [phone]

## 2017-04-03 NOTE — Progress Notes (Addendum)
Subjective: Patient denies any headaches. She is doing very well. Denies any drainage from incision  Objective: Vital signs in last 24 hours: Temp:  [97.4 F (36.3 C)-98.3 F (36.8 C)] 98.1 F (36.7 C) (03/18 0800) Pulse Rate:  [55-93] 58 (03/18 0600) Resp:  [10-21] 12 (03/18 0600) BP: (109-166)/(62-104) 145/72 (03/18 0600) SpO2:  [92 %-100 %] 99 % (03/18 0600)  Intake/Output from previous day: 03/17 0701 - 03/18 0700 In: 502.5 [I.V.:240; IV Piggyback:262.5] Out: 2725 [Urine:2725] Intake/Output this shift: No intake/output data recorded.  Neurologic: Grossly normal  Lab Results: Lab Results  Component Value Date   WBC 4.9 04/03/2017   HGB 8.7 (L) 04/03/2017   HCT 28.1 (L) 04/03/2017   MCV 100.7 (H) 04/03/2017   PLT 101 (L) 04/03/2017   Lab Results  Component Value Date   INR 1.15 08/25/2014   BMET Lab Results  Component Value Date   NA 137 04/03/2017   K 4.5 04/03/2017   CL 100 (L) 04/03/2017   CO2 28 04/03/2017   GLUCOSE 159 (H) 04/03/2017   BUN 28 (H) 04/03/2017   CREATININE 0.71 04/03/2017   CALCIUM 8.8 (L) 04/03/2017    Studies/Results: Ct Angio Head W Or Wo Contrast  Result Date: 04/01/2017 CLINICAL DATA:  67 year old female status post suboccipital craniectomy for debulking of cerebellar metastasis. 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 Increasing blood tinged CSF from external ventricular drain. EXAM: CT ANGIOGRAPHY HEAD TECHNIQUE: Multidetector CT imaging of the head was performed using the standard protocol during bolus administration of intravenous contrast. Multiplanar CT image reconstructions and MIPs were obtained to evaluate the vascular anatomy. CONTRAST:  66mL ISOVUE-370 IOPAMIDOL (ISOVUE-370) INJECTION 76% COMPARISON:  Postoperative head CT without contrast 03/27/2017, and earlier. FINDINGS: CT HEAD Brain: Moderate to large volume pneumocephalus is new since 03/27/2017 with mass effect on the anterior frontal lobes (series 4 image 13). Associated  partial effacement of the basilar cisterns compared to 03/27/2017 (sagittal series 8, image 28). Scattered other pneumocephalus including along the tentorium and also in the basilar cisterns abutting cavernous sinus more so on the left (series 6, image 35). New right frontal approach EVD in place and appears to course through the right lateral ventricle terminating within or adjacent to the posterior margin of the 3rd ventricle on series 4, image 14. Decreased lateral ventricle size since 03/27/2017. The 3rd and 4th ventricle size is not significantly changed. No gas identified within the ventricles. No acute intracranial hemorrhage identified. Small volume low-density extra-axial collections persist at the vertex, but appear decreased. Postoperative changes to the posterior fossa and right cerebellar hemisphere appear stable. Mild residual edema suspected in the right cerebellar hemisphere. The suboccipital fluid collection persists and now contains a small volume of gas (sagittal series 8, image 25). At the level of the foramen magnum the fluid collection size appears mildly decreased (4 centimeters largest dimension versus 5.3 centimeters previously), although more caudal extent of the fluid is visible today. No acute cortically based infarct identified. Additionally, there appears to be epidural venous prominence in the visible upper cervical spine (series 4, image 1) which enhances following contrast (series 18, image 2). Skull: Suboccipital craniectomy changes. Right frontal bone burr hole placement is new since 03/27/2017. There is a small volume of gas within the burr hole along the EVD (series 5, image 54). No skull fracture identified. Sinuses: Visualized paranasal sinuses and mastoids are stable and well pneumatized. Orbits: Visualized orbit soft tissues are within normal limits. Mild postoperative soft tissue changes along the right  superior scalp at the EVD site. No subcutaneous gas. CTA HEAD Posterior  circulation: Patent distal vertebral arteries without stenosis. The right appears mildly dominant. Both PICA origins are patent. Patent vertebrobasilar junction and basilar artery. No basilar stenosis. The basilar tip, SCA and right PCA origins appear normal. There is a fetal type left PCA origin. Both posterior communicating arteries appear within normal limits, the right is diminutive. The bilateral PCA branches are within normal limits. Anterior circulation: Patent cervical ICAs with mild tortuosity on the left. Both ICA siphons are patent. There is mild to moderate bilateral siphon calcified plaque. No hemodynamically significant siphon stenosis on the right. There is moderate left ICA siphon stenosis in both the cavernous and proximal supraclinoid segments due to plaque. Both ophthalmic and posterior communicating artery origins appear normal. Patent carotid termini. Normal MCA and ACA origins. Bilateral ACA branches are within normal limits. The left MCA M1 segment, bifurcation, and left MCA branches are within normal limits. The right MCA M1 segment, bifurcation, and right MCA branches are within normal limits. Venous sinuses: Patent on the delayed images. Anatomic variants: Fetal type left PCA origin. Delayed phase: Decreased enhancement along the posterior right cerebellar resection margin compared to the February MRI, although this may be related to differences in modality. No abnormal intracranial enhancement identified. Incidentally, increased Upper cervical spine epidural venous engorgement and enhancement is noted (series 18, image 1). IMPRESSION: 1. No intracranial aneurysm or acute arterial abnormality identified. Moderate left ICA siphon stenosis due to calcified plaque. 2. New moderate to large volume pneumocephalus with mass effect on the anterior frontal lobes and mild effacement of the basilar cisterns since 03/27/2017. 3. Right frontal approach EVD in place with interval decreased lateral  ventricle size. Small volume subdural hygromas remain visible at the vertex. 4. Right suboccipital craniectomy and right cerebellar resection with perhaps mildly decreased postoperative pseudomeningocele since 03/27/2017. I note that this fluid collection also now contains some gas. Salient findings on this exam were discussed by telephone with Dr. Kary Kos on 04/01/2017 1852 hours. Electronically Signed   By: Genevie Ann M.D.   On: 04/01/2017 19:09   Ct Head Wo Contrast  Result Date: 04/03/2017 CLINICAL DATA:  Initial evaluation for subacute neuro deficits. EXAM: CT HEAD WITHOUT CONTRAST TECHNIQUE: Contiguous axial images were obtained from the base of the skull through the vertex without intravenous contrast. COMPARISON:  Prior CT from 04/01/2017. FINDINGS: Brain: Moderate to large volume pneumocephalus overlying the frontal convexities again seen, slightly improved from previous. Few scattered foci of pneumocephalus noted along the tentorium and falx as well. There has been interval removal of a right frontal approach EVD. Lateral ventricular size slightly increased from previous but remains within normal limits without hydrocephalus. Third and fourth ventricular size not significantly changed. No acute intracranial hemorrhage. No findings to suggest acute large vessel territory infarct. Small volume extra-axial collections at the vertex are stable to slightly decreased. Postoperative changes from prior suboccipital craniectomy for tumor resection. Slightly improved edema within the adjacent right cerebellar hemisphere. Fluid collection at the craniectomy site appears slightly increased in size now measuring up to 6.9 cm in transverse diameter. More cephalad extension posterior to the occipital calvarium seen on today's exam. Scattered foci of gas again seen within this collection. Vascular: No hyperdense vessel. Skull: Changes related to suboccipital craniectomy. Right frontal burr hole with overlying skin  staples noted. Sinuses/Orbits: Globes and orbital soft tissues within normal limits. Paranasal sinuses and mastoid air cells are clear. Other: None. IMPRESSION: 1.  Postoperative changes from prior right suboccipital craniectomy and right cerebellar tumor resection. Postoperative collection/pseudomeningocele at the craniectomy site slightly increased in size as compared to previous exam. 2. Slightly decreased postoperative pneumocephalus as compared to previous. 3. Interval removal of right frontal approach EVD. Slightly increased size of lateral ventricular size without hydrocephalus. 4. No other new intracranial abnormality identified. Electronically Signed   By: Jeannine Boga M.D.   On: 04/03/2017 03:55    Assessment/Plan: Patient doing very well. Will consult rehab.    LOS: 27 days    Ocie Cornfield [facility] 04/03/2017, 10:23 AM   Agree with above

## 2017-04-03 NOTE — Evaluation (Signed)
Occupational Therapy Re-Evaluation Patient Details Name: Denise Morton MRN: [id number] DOB: 01-Aug-1950 Today's Date: 04/03/2017    History of Present Illness  Denise Morton is a 67 y.o. 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  PMHx of Breast CA, DM, central retinal vein occlusion right eye   Clinical Impression   Pt admitted with above. She demonstrates the below listed deficits and will benefit from continued OT to maximize safety and independence with BADLs.  Pt seen by OT for re-evaluation as she demonstrates significant decline in function since last seen and OT orders discontinued.  Currently, pt demonstrates deficits with balance, cognition, vision, coordination, as well as decreased activity tolerance, and generalized weakness.  She requires min - max A for ADLs, and mod A +2 for functional mobility.  She fatigues quickly with activity.  Currently, do not feel she will be able to tolerate the intensity of CIR level therapies, therefore, recommend SNF at discharge.  Will follow acutely.       Follow Up Recommendations  SNF;Supervision/Assistance - 24 hour    Equipment Recommendations  3 in 1 bedside commode;Tub/shower seat    Recommendations for Other Services       Precautions / Restrictions Precautions Precautions: Fall      Mobility Bed Mobility Overal bed mobility: Needs Assistance Bed Mobility: Supine to Sit     Supine to sit: Min assist;HOB elevated     General bed mobility comments: min assist to elevate trunk from surface, HOB 30, increased time, use of rail, assist to move legs to initiate transition to EOB, cues for sequence  Transfers Overall transfer level: Needs assistance   Transfers:  Sit to/from Stand Sit to Stand: Mod assist;+2 physical assistance         General transfer comment: mod assist to stand from bed and chair with 2 person assist, cues for sequence and physical assist to rise x 3 trials, cues for hand placement. pt does not control descent to surface    Balance Overall balance assessment: Needs assistance   Sitting balance-Leahy Scale: Fair     Standing balance support: Bilateral upper extremity supported Standing balance-Leahy Scale: Poor Standing balance comment: requires UE support                            ADL either performed or assessed with clinical judgement   ADL Overall ADL's : Modified independent     Grooming: Wash/dry hands;Wash/dry face;Oral care;Set up;Sitting   Upper Body Bathing: Minimal assistance;Sitting   Lower Body Bathing: Maximal assistance;Sit to/from stand   Upper Body Dressing : Minimal assistance;Sitting   Lower Body Dressing: Maximal assistance;Sit to/from stand Lower Body Dressing Details (indicate cue type and reason): unable to access feet  Toilet Transfer: Moderate assistance;+2 for physical assistance;Ambulation;Stand-pivot;Comfort height toilet;BSC;Grab bars;RW   Toileting- Clothing Manipulation and Hygiene: Maximal assistance;Sit to/from stand       Functional mobility during ADLs: Moderate assistance;+2 for physical assistance;Rolling walker       Vision Baseline Vision/History: Wears glasses Wears Glasses: At all times Patient Visual Report: Blurring of vision;Diplopia Vision Assessment?: Yes Eye Alignment: Impaired (comment) Ocular Range of Motion: Restricted on the left Tracking/Visual Pursuits: Left eye does not track  medially Convergence: Impaired (comment) Diplopia Assessment: Disappears with one eye closed;Present all the time/all directions Additional Comments: Pt wtih significantly dysconjugate gaze.  Rt eye exotrophic, but full EOM with tracking.  Rt eye with limited  adduction - tracks to midline only.  Lt pupil dilated      Perception Perception Perception Tested?: Yes   Praxis Praxis Praxis tested?: Deficits Deficits: Ideomotor    Pertinent Vitals/Pain Pain Assessment: No/denies pain     Hand Dominance Right   Extremity/Trunk Assessment Upper Extremity Assessment Upper Extremity Assessment: Generalized weakness   Lower Extremity Assessment Lower Extremity Assessment: Defer to PT evaluation   Cervical / Trunk Assessment Cervical / Trunk Assessment: Normal(truncal weakness noted on Rt )   Communication Communication Communication: No difficulties   Cognition Arousal/Alertness: Awake/alert Behavior During Therapy: WFL for tasks assessed/performed Overall Cognitive Status: Impaired/Different from baseline Area of Impairment: Safety/judgement;Problem solving;Orientation;Memory                   Current Attention Level: Sustained Memory: Decreased short-term memory Following Commands: Follows one step commands consistently Safety/Judgement: Decreased awareness of deficits;Decreased awareness of safety   Problem Solving: Difficulty sequencing;Slow processing;Decreased initiation General Comments: pt unable to name 3 animals today that start with the same letter which she previously has done each session. Pt with inability to recall activity over the weekend   General Comments  HR to 140s with activity     Exercises     Shoulder Instructions      Home Living Family/patient expects to be discharged to:: Private residence Living Arrangements: Alone Available Help at Discharge: Family;Available PRN/intermittently Type of Home: House Home Access: Stairs to enter [employer] of Steps: 1   Home Layout: One level     Bathroom Shower/Tub: Occupational psychologist: Handicapped height     Home Equipment: Kasandra Knudsen - single point      Lives With: Alone    Prior Functioning/Environment Level of  Independence: Independent                 OT Problem List: Decreased strength;Decreased activity tolerance;Impaired balance (sitting and/or standing);Impaired vision/perception;Decreased coordination;Decreased cognition;Decreased knowledge of use of DME or AE;Decreased safety awareness;Obesity      OT Treatment/Interventions: Self-care/ADL training;Neuromuscular education;DME and/or AE instruction;Energy conservation;Manual therapy;Therapeutic activities;Cognitive remediation/compensation;Visual/perceptual remediation/compensation;Patient/family education;Balance training    OT Goals(Current goals can be found in the care plan section) Acute Rehab OT Goals Patient Stated Goal: to get stronger  OT Goal Formulation: With patient Time For Goal Achievement: 04/17/17 Potential to Achieve Goals: Good ADL Goals Pt Will Perform Grooming: with min assist;standing Pt Will Perform Upper Body Bathing: with set-up;with supervision;sitting Pt Will Perform Lower Body Bathing: with mod assist;sit to/from stand Pt Will Perform Upper Body Dressing: with set-up;with supervision;sitting Pt Will Perform Lower Body Dressing: with mod assist;sit to/from stand;with adaptive equipment Pt Will Transfer to Toilet: with min assist;ambulating;regular height toilet;bedside commode;grab bars Pt Will Perform Toileting - Clothing Manipulation and hygiene: with min assist;sit to/from stand Additional ADL Goal #1: Pt will be independent with HEP for vision  OT Frequency: Min 3X/week   Barriers to D/C: Decreased caregiver support          Co-evaluation PT/OT/SLP Co-Evaluation/Treatment: Yes Reason for Co-Treatment: Complexity of the patient's impairments (multi-system involvement);For patient/therapist safety;To address functional/ADL transfers PT goals addressed during session: Mobility/safety with mobility;Balance;Proper use of DME OT goals addressed during session: ADL's and self-care;Strengthening/ROM       AM-PAC PT "6 Clicks" Daily Activity  Outcome Measure Help from another person eating meals?: None Help from another person taking care of personal grooming?: A Little Help from another person toileting, which includes using toliet, bedpan, or urinal?: A Lot Help from another person bathing (including washing, rinsing, drying)?: A Lot Help from another person to put on and taking off regular upper body clothing?: A Little Help from another person to put on and taking off regular lower body clothing?: A Lot 6 Click Score: 16   End of Session Equipment Utilized During Treatment: Rolling walker Nurse Communication: Mobility status  Activity Tolerance: Patient tolerated treatment well Patient left: in chair;with call bell/phone within reach;with chair alarm set  OT Visit Diagnosis: Unsteadiness on feet (R26.81);Cognitive communication deficit (R41.841)                Time: 5909-3112 OT Time Calculation (min): 27 min Charges:  OT General Charges $OT Visit: 1 Visit OT Evaluation $OT Re-eval: 1 Re-eval G-Codes:     [facility], OTR/L [phone]   Lucille Passy M 04/03/2017, 3:04 PM

## 2017-04-04 ENCOUNTER — Inpatient Hospital Stay (HOSPITAL_COMMUNITY): Payer: PPO

## 2017-04-04 LAB — GLUCOSE, CAPILLARY
Glucose-Capillary: 148 mg/dL — ABNORMAL HIGH (ref 65–99)
Glucose-Capillary: 201 mg/dL — ABNORMAL HIGH (ref 65–99)
Glucose-Capillary: 154 mg/dL — ABNORMAL HIGH (ref 65–99)
Glucose-Capillary: 201 mg/dL — ABNORMAL HIGH (ref 65–99)

## 2017-04-04 LAB — CBC
HCT: 27.7 % — ABNORMAL LOW (ref 36.0–46.0)
HEMOGLOBIN: 8.8 g/dL — AB (ref 12.0–15.0)
MCH: 32.4 pg (ref 26.0–34.0)
MCHC: 31.8 g/dL (ref 30.0–36.0)
MCV: 101.8 fL — ABNORMAL HIGH (ref 78.0–100.0)
Platelets: 98 10*3/uL — ABNORMAL LOW (ref 150–400)
RBC: 2.72 MIL/uL — ABNORMAL LOW (ref 3.87–5.11)
RDW: 18.8 % — AB (ref 11.5–15.5)
WBC: 4.5 10*3/uL (ref 4.0–10.5)

## 2017-04-04 LAB — BASIC METABOLIC PANEL
ANION GAP: 10 (ref 5–15)
BUN: 28 mg/dL — AB (ref 6–20)
CO2: 29 mmol/L (ref 22–32)
Calcium: 8.9 mg/dL (ref 8.9–10.3)
Chloride: 99 mmol/L — ABNORMAL LOW (ref 101–111)
Creatinine, Ser: 0.74 mg/dL (ref 0.44–1.00)
GFR calc Af Amer: >60 mL/min (ref >60)
GFR calc non Af Amer: >60 mL/min (ref >60)
GLUCOSE: 162 mg/dL — AB (ref 65–99)
POTASSIUM: 4.1 mmol/L (ref 3.5–5.1)
Sodium: 138 mmol/L (ref 135–145)

## 2017-04-04 LAB — CSF CULTURE: CULTURE: NO GROWTH

## 2017-04-04 LAB — CSF CULTURE W GRAM STAIN: Special Requests: NORMAL

## 2017-04-04 MED ORDER — ACETAZOLAMIDE 250 MG PO TABS
250.0000 mg | ORAL_TABLET | Freq: Four times a day (QID) | ORAL | Status: DC
  Administered 2017-04-04 – 2017-04-14 (×42): 250 mg via ORAL
  Filled 2017-04-04 (×42): qty 1

## 2017-04-04 NOTE — Progress Notes (Signed)
Patient ID: Clinton Quant, female   DOB: Jun 29, 1950, 67 y.o.   MRN: [id number]  This NP visited patient at the bedside as a follow up for palliative medicine needs and emotional support.   Patient has o/c of headache, pain, dizziness, visual changes, weakness.  Complicated hospitalization 2/2, metastatic breast cancer;   CT and MRI for large Right cerebellar met as well as a smaller right occipital met, s/p craniotomy for resection followed by CSF leak.    Patient is postop  from reexploration and re-repair of primary CSF leak with placement of right frontal external ventricular drain.  She is alert and oriented,  patient's ultimate goal is discharge from hospital and proceed with short-term rehab.  She shares with me that her mother died over the weekend.  Emotional support offered, patient shares family stories and memories.      We discussed the current medical situation and the importance of continued conversation regarding  overall plan of care and treatment options,  ensuring decisions are within the context of the patients values and GOCs.  Patient and her family will  face  treatment option decisions , advanced directive decisions,and anticipatory care needs along the course of this disease trajectory.  Patient  is open to all offered and available medical interventions to prolong life  Questions and concerns addressed     Time in  1700     Time out 1735    Total time spent on the unit was 35   minutes  Greater than 50% of the time was spent in counseling and coordination of care  Wadie Lessen NP  Palliative Medicine Team Team Phone # [phone] Pager [phone]

## 2017-04-04 NOTE — Progress Notes (Signed)
PROGRESS NOTE  Denise Morton  ZRA:[id number] DOB: [birth year] DOA: 03/07/2017 PCP: Seward Carol, MD  Brief Narrative:  67 y.o.femaleWith pmhx of BRCA, DM, admitted on 03/07/17 with weakness, dizziness, anorexia, speech difficulties and near syncope. MRI brain demonstrated metastatic lesions to the cerebellum and right occipital areas. She was initially admitted to [facility].  Neurosurgery was consulted and patient was transferred to [facility] on 2/23. She underwent suboccipital craniectomy for resection of right cerebellar meton 2/25 by Dr. Saintclair Halsted.  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  Symptomatically she is feeling much better with improved strength, speech, appetite.  Returned to the OR on 3/6 for correction of her CSF leak underwent reexploration of the suboccipital incision for repair of primary CSF leak utilizing DuraMatrix patch with fascial patch grafts.  She has continued to have a slow CSF leak.  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  Dr. Saintclair Halsted planning to do a stereotactic ventriculostomy placement and primary reexploration of her incision and reclosure on 3/12.    Pt had repeat CT scan showing persistent large amount of pneumocephalus. 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  - Appreciate Palliative Care consultation - Neurosurgery on Managing currently  2)Type 2 diabetes, blood sugars slightly elevated but NPO today for surgery - continue current medication regimen. Blood  Sugars have ranged from 201-107  3)hypertension, blood pressure gradually trending  down Stable continue following. -  Continue Norvasc -  continue labetalol -  Continue lisinopril  -  Continue labetalol and hydralazine prn  4]anemia of chronic disease, hemoglobin improving -   Occult stool -  Continue iron supplementation (do not see that testing was performed)  Leukocytosis, resolved.   DVT prophylaxis:  SCDs  Code Status: Full code Family Communication:  No family at bedside today Disposition Plan:  Ongoing management of CSF leak by neurosurgery.     Consultants:   Neurosurgery  XRT oncology  PM&R  Procedures:  03/13/2017 suboccipital craniectomy for resection of right cerebellar met  03/22/2017:  Wound revision due to CSF leak 03/28/2017:  Stereotactic ventriculostomy placement and primary reexploration of her incision and reclosure on 3/12  Antimicrobials:  Anti-infectives (From admission, onward)   Start     Dose/Rate Route Frequency Ordered Stop   03/30/17 1000  gentamicin (GARAMYCIN) 500 mg in dextrose 5 % 100 mL IVPB     500 mg 112.5 mL/hr over 60 Minutes Intravenous Every 36 hours 03/29/17 1317     03/29/17 0700  vancomycin (VANCOCIN) IVPB 750 mg/150 ml premix     750 mg 150 mL/hr over 60 Minutes Intravenous Every 12 hours 03/28/17 2003     03/28/17 2030  gentamicin (GARAMYCIN) 500 mg in dextrose 5 % 100 mL IVPB  Status:  Discontinued     500 mg 112.5 mL/hr over 60 Minutes Intravenous Every 24 hours 03/28/17 1922 03/29/17 1317   03/28/17 1930  vancomycin (VANCOCIN) IVPB 1000 mg/200 mL premix     1,000 mg 200 mL/hr over 60 Minutes Intravenous  Once 03/28/17 1922 03/28/17 2108   03/28/17 1620  bacitracin 50,000 Units in sodium chloride irrigation 0.9 % 500 mL irrigation  Status:  Discontinued  As needed 03/28/17 1718 03/28/17 1921   03/28/17 1530  vancomycin (VANCOCIN) IVPB 1000 mg/200 mL premix     1,000 mg 200 mL/hr over 60 Minutes Intravenous  Once 03/28/17 1525 03/28/17 1702   03/28/17 1529  vancomycin (VANCOCIN) 1-5 GM/200ML-%  IVPB    Comments:  Henrine Screws   : cabinet override      03/28/17 1529 03/28/17 1602   03/23/17 1200  doxycycline (VIBRA-TABS) tablet 100 mg     100 mg Oral Every 12 hours 03/23/17 0954     03/22/17 2030  vancomycin (VANCOCIN) IVPB 1000 mg/200 mL premix     1,000 mg 200 mL/hr over 60 Minutes Intravenous Every 12 hours 03/22/17 1912 03/23/17 0939   03/22/17 1428  bacitracin 50,000 Units in sodium chloride irrigation 0.9 % 500 mL irrigation  Status:  Discontinued       As needed 03/22/17 1428 03/22/17 1550   03/20/17 1100  doxycycline (VIBRA-TABS) tablet 100 mg  Status:  Discontinued     100 mg Oral Every 12 hours 03/20/17 1011 03/23/17 0954   03/20/17 0000  doxycycline (VIBRA-TABS) 100 MG tablet     100 mg Oral Every 12 hours 03/20/17 1245     03/13/17 1930  vancomycin (VANCOCIN) IVPB 1000 mg/200 mL premix     1,000 mg 200 mL/hr over 60 Minutes Intravenous Every 12 hours 03/13/17 1836 03/14/17 0756   03/13/17 0858  bacitracin 50,000 Units in sodium chloride irrigation 0.9 % 500 mL irrigation  Status:  Discontinued       As needed 03/13/17 0858 03/13/17 1604   03/13/17 0730  vancomycin (VANCOCIN) IVPB 1000 mg/200 mL premix  Status:  Discontinued     1,000 mg 200 mL/hr over 60 Minutes Intravenous To Surgery 03/13/17 0729 03/13/17 1810       Subjective: Pt would like medication to help her have a BM   Objective: Vitals:   04/04/17 0404 04/04/17 0500 04/04/17 0847 04/04/17 1057  BP: 133/63  (!) 165/91 (!) 144/71  Pulse: 64  87 66  Resp: 18  20 12   Temp: 98.3 F (36.8 C)  97.8 F (36.6 C) 98.3 F (36.8 C)  TempSrc: Oral  Oral Oral  SpO2: 95%  96% 97%  Weight:  103.5 kg (228 lb 2.8 oz)    Height:        Intake/Output Summary (Last 24 hours) at 04/04/2017 1430 Last data filed at 04/04/2017 1409 Gross per 24 hour  Intake 782.5 ml  Output 1650 ml  Net -867.5 ml   Filed Weights   03/31/17 0400 04/01/17 0500 04/04/17 0500  Weight: 104.5 kg (230 lb 6.1 oz) 103.5 kg (228 lb  2.8 oz) 103.5 kg (228 lb 2.8 oz)    Examination:    General exam:  Pt in nad, alert and awake HEENT:  MMM, posterior scalp craniocaudal incision in the midline with staples.  Edges well approximated, no induration or erythema.   Respiratory system: Clear to auscultation bilaterally, equal chest rise. Cardiovascular system: Regular rate and rhythm, normal S1/S2. No murmurs, rubs, gallops or clicks.  Warm extremities Gastrointestinal system: Normal active bowel sounds, soft, nondistended, nontender. MSK:  Normal tone and bulk, no lower extremity edema Neuro:  Grossly intact   Data Reviewed: I have personally reviewed following labs and imaging studies  CBC: Recent Labs  Lab 03/31/17 0336 04/01/17 0621 04/02/17 0500 04/03/17 0509 04/04/17 0535  WBC 7.0 5.4 5.5 4.9 4.5  HGB 9.9* 9.4* 8.9* 8.7* 8.8*  HCT 31.2*  29.8* 27.7* 28.1* 27.7*  MCV 101.3* 100.7* 100.7* 100.7* 101.8*  PLT 108* 109* 98* 101* 98*   Basic Metabolic Panel: Recent Labs  Lab 03/31/17 0336 04/01/17 0621 04/02/17 0500 04/03/17 0509 04/04/17 0535  NA 136 136 138 137 138  K 4.7 4.5 4.5 4.5 4.1  CL 106 103 105 100* 99*  CO2 20* 25 27 28 29   GLUCOSE 188* 162* 91 159* 162*  BUN 41* 33* 30* 28* 28*  CREATININE 0.89 0.77 0.74 0.71 0.74  CALCIUM 8.7* 8.7* 8.7* 8.8* 8.9   GFR: Estimated Creatinine Clearance: 84.1 mL/min (by C-G formula based on SCr of 0.74 mg/dL). Liver Function Tests: No results for input(s): AST, ALT, ALKPHOS, BILITOT, PROT, ALBUMIN in the last 168 hours. No results for input(s): LIPASE, AMYLASE in the last 168 hours. No results for input(s): AMMONIA in the last 168 hours. Coagulation Profile: No results for input(s): INR, PROTIME in the last 168 hours. Cardiac Enzymes: No results for input(s): CKTOTAL, CKMB, CKMBINDEX, TROPONINI in the last 168 hours. BNP (last 3 results) No results for input(s): PROBNP in the last 8760 hours. HbA1C: No results for input(s): HGBA1C in the last 72  hours. CBG: Recent Labs  Lab 04/03/17 1212 04/03/17 1634 04/03/17 2148 04/04/17 0843 04/04/17 1150  GLUCAP 171* 107* 191* 148* 201*   Lipid Profile: No results for input(s): CHOL, HDL, LDLCALC, TRIG, CHOLHDL, LDLDIRECT in the last 72 hours. Thyroid Function Tests: No results for input(s): TSH, T4TOTAL, FREET4, T3FREE, THYROIDAB in the last 72 hours. Anemia Panel: No results for input(s): VITAMINB12, FOLATE, FERRITIN, TIBC, IRON, RETICCTPCT in the last 72 hours. Urine analysis: No results found for: COLORURINE, APPEARANCEUR, LABSPEC, PHURINE, GLUCOSEU, HGBUR, BILIRUBINUR, KETONESUR, PROTEINUR, UROBILINOGEN, NITRITE, LEUKOCYTESUR Sepsis Labs: @LABRCNTIP (procalcitonin:4,lacticidven:4)  ) Recent Results (from the past 240 hour(s))  CSF culture with Stat gram stain     Status: None   Collection Time: 04/01/17 10:24 AM  Result Value Ref Range Status   Specimen Description CSF DRAWN FROM IVC  Final   Special Requests Normal  Final   Gram Stain   Final    FEW WBC PRESENT,BOTH PMN AND MONONUCLEAR NO ORGANISMS SEEN    Culture   Final    NO GROWTH 3 DAYS Performed at [facility], [address] [place], [place] [address]    Report Status 04/04/2017 FINAL  Final      Radiology Studies: Ct Head Wo Contrast  Result Date: 04/04/2017 CLINICAL DATA:  Followup intracranial hemorrhage. Craniotomy with tumor resection right cerebellum. EXAM: CT HEAD WITHOUT CONTRAST TECHNIQUE: Contiguous axial images were obtained from the base of the skull through the vertex without intravenous contrast. COMPARISON:  04/03/2017. 04/01/2017. 03/27/2017. MRI studies 03/14/2017 and 03/07/2017. FINDINGS: Brain: Previous occipital craniectomy for right cerebellar resection. Fluid collection and packing material at the craniotomy site is getting slightly smaller. Subtotal resection of the right cerebellar mass, with residual punctate foci of calcification along the margins of resection in the right  superior surface of the cerebellum, presumed to be associated with some residual tumor tissue. Persistent mass-effect upon the fourth ventricle but without developing hydrocephalus. Less bilateral subdural fluid in air than was seen yesterday. Tiny lesion of the right occipital lobe shown by MRI not visible by CT. Vascular: There is atherosclerotic calcification of the major vessels at the base of the brain. Skull: No other calvarial finding. Sinuses/Orbits: Clear/normal Other: None IMPRESSION: Diminishing amount bilateral subdural fluid and air. Slight reduction in size of the fluid collection and packing  material at the craniotomy site. Similar pattern cerebellar edema with mass-effect upon the fourth ventricle postoperatively as expected. Residual punctate calcifications presumed to be associated with residual tumor tissue. Electronically Signed   By: Nelson Chimes M.D.   On: 04/04/2017 09:40   Ct Head Wo Contrast  Result Date: 04/03/2017 CLINICAL DATA:  Initial evaluation for subacute neuro deficits. EXAM: CT HEAD WITHOUT CONTRAST TECHNIQUE: Contiguous axial images were obtained from the base of the skull through the vertex without intravenous contrast. COMPARISON:  Prior CT from 04/01/2017. FINDINGS: Brain: Moderate to large volume pneumocephalus overlying the frontal convexities again seen, slightly improved from previous. Few scattered foci of pneumocephalus noted along the tentorium and falx as well. There has been interval removal of a right frontal approach EVD. Lateral ventricular size slightly increased from previous but remains within normal limits without hydrocephalus. Third and fourth ventricular size not significantly changed. No acute intracranial hemorrhage. No findings to suggest acute large vessel territory infarct. Small volume extra-axial collections at the vertex are stable to slightly decreased. Postoperative changes from prior suboccipital craniectomy for tumor resection. Slightly  improved edema within the adjacent right cerebellar hemisphere. Fluid collection at the craniectomy site appears slightly increased in size now measuring up to 6.9 cm in transverse diameter. More cephalad extension posterior to the occipital calvarium seen on today's exam. Scattered foci of gas again seen within this collection. Vascular: No hyperdense vessel. Skull: Changes related to suboccipital craniectomy. Right frontal burr hole with overlying skin staples noted. Sinuses/Orbits: Globes and orbital soft tissues within normal limits. Paranasal sinuses and mastoid air cells are clear. Other: None. IMPRESSION: 1. Postoperative changes from prior right suboccipital craniectomy and right cerebellar tumor resection. Postoperative collection/pseudomeningocele at the craniectomy site slightly increased in size as compared to previous exam. 2. Slightly decreased postoperative pneumocephalus as compared to previous. 3. Interval removal of right frontal approach EVD. Slightly increased size of lateral ventricular size without hydrocephalus. 4. No other new intracranial abnormality identified. Electronically Signed   By: Jeannine Boga M.D.   On: 04/03/2017 03:55     Scheduled Meds: . acetaZOLAMIDE  250 mg Oral QID  . amLODipine  10 mg Oral Daily  . Chlorhexidine Gluconate Cloth  6 each Topical Q0600  . dexamethasone  2 mg Oral Q6H  . docusate sodium  100 mg Oral BID  . doxycycline  100 mg Oral Q12H  . feeding supplement (GLUCERNA SHAKE)  237 mL Oral BID BM  . ferrous sulfate  325 mg Oral BID WC  . insulin aspart  0-20 Units Subcutaneous TID WC  . insulin aspart  0-5 Units Subcutaneous QHS  . insulin aspart  10 Units Subcutaneous TID WC  . insulin glargine  24 Units Subcutaneous QHS  . labetalol  200 mg Oral TID  . levETIRAcetam  500 mg Oral BID  . lisinopril  5 mg Oral Daily  . mirtazapine  15 mg Oral QHS  . nystatin-triamcinolone   Topical BID  . pantoprazole  40 mg Oral QHS  . polyethylene  glycol  17 g Oral Daily  . senna  1 tablet Oral BID  . sodium chloride flush  10-40 mL Intracatheter Q12H   Continuous Infusions: . sodium chloride 10 mL/hr at 04/03/17 1600  . gentamicin Stopped (04/04/17 0021)  . vancomycin Stopped (04/04/17 0952)     LOS: 28 days    Time spent: 10 min  Velvet Bathe, MD [facility] Pager [phone]  If 7PM-7AM, please contact night-coverage [email or web address] Password [email or web address]  04/04/2017, 2:30 PM

## 2017-04-04 NOTE — Progress Notes (Signed)
Nutrition Follow-up  DOCUMENTATION CODES:   Obesity unspecified  INTERVENTION:  Continue Glucerna Shake po BID, each supplement provides 220 kcal and 10 grams of protein.  Encourage adequate PO intake.   NUTRITION DIAGNOSIS:   Increased nutrient needs related to chronic illness, cancer and cancer related treatments as evidenced by estimated needs; ongoing  GOAL:   Patient will meet greater than or equal to 90% of their needs; met  MONITOR:   PO intake, Labs, Supplement acceptance, Weight trends, I & O's  REASON FOR ASSESSMENT:   Malnutrition Screening Tool    ASSESSMENT:   Pt with PMH of metastatic breast cancer, gastritis, and DM presents with weakness s/p syncope  Pt underwent suboccipital craniectomy for resection of right cerebellar meton 2/25. 3/6 for correction of her CSF leak underwent reexploration of the suboccipital incision for repair of primary CSF leak. Stereotactic ventriculostomy placement and primary reexploration of her incision and reclosure on 3/12.  Per MD note, repeat CT scan showing persistent large amount of pneumocephalus. Neurosurgery MD reports likely will need a ventriculoperitoneal shunt. Meal completion has been 75-100%. Pt currently has Glucerna shake ordered and has been consuming them. RD to continue with current orders to aid in caloric and protein needs.   Labs and medications reviewed.   Diet Order:  Diet - low sodium heart healthy Diet Carb Modified Fluid consistency: Thin; Room service appropriate? Yes  EDUCATION NEEDS:   Education needs have been addressed  Skin:  Skin Assessment: Skin Integrity Issues: Skin Integrity Issues:: Stage I Stage I: coccyx  Last BM:  3/18  Height:   Ht Readings from Last 1 Encounters:  03/22/17 5' 6"  (1.676 m)    Weight:   Wt Readings from Last 1 Encounters:  04/04/17 228 lb 2.8 oz (103.5 kg)    Ideal Body Weight:  61.4 kg  BMI:  Body mass index is 36.83 kg/m.  Estimated Nutritional  Needs:   Kcal:  2020-2263  Protein:  110-120 grams  Fluid:  >/= 2 L/d    Corrin Parker, MS, RD, LDN Pager # [phone] After hours/ weekend pager # [phone]

## 2017-04-04 NOTE — Progress Notes (Signed)
Subjective: Patient reports doing well no headache however drainage started last night.  Objective: Vital signs in last 24 hours: Temp:  [98.1 F (36.7 C)-98.4 F (36.9 C)] 98.3 F (36.8 C) (03/19 0404) Pulse Rate:  [59-96] 64 (03/19 0404) Resp:  [14-28] 18 (03/19 0404) BP: (106-166)/(53-138) 133/63 (03/19 0404) SpO2:  [91 %-100 %] 95 % (03/19 0404) Weight:  [103.5 kg (228 lb 2.8 oz)] 103.5 kg (228 lb 2.8 oz) (03/19 0500)  Intake/Output from previous day: 03/18 0701 - 03/19 0700 In: 652.5 [I.V.:240; IV Piggyback:412.5] Out: 2550 [Urine:2550] Intake/Output this shift: No intake/output data recorded.  neurologically nonfocal and intact except for the left third nerve palsy. Incision is showing a spinal fluid leak inferiorly.  Lab Results: Recent Labs    04/03/17 0509 04/04/17 0535  WBC 4.9 4.5  HGB 8.7* 8.8*  HCT 28.1* 27.7*  PLT 101* 98*   BMET Recent Labs    04/03/17 0509 04/04/17 0535  NA 137 138  K 4.5 4.1  CL 100* 99*  CO2 28 29  GLUCOSE 159* 162*  BUN 28* 28*  CREATININE 0.71 0.74  CALCIUM 8.8* 8.9    Studies/Results: Ct Head Wo Contrast  Result Date: 04/03/2017 CLINICAL DATA:  Initial evaluation for subacute neuro deficits. EXAM: CT HEAD WITHOUT CONTRAST TECHNIQUE: Contiguous axial images were obtained from the base of the skull through the vertex without intravenous contrast. COMPARISON:  Prior CT from 04/01/2017. FINDINGS: Brain: Moderate to large volume pneumocephalus overlying the frontal convexities again seen, slightly improved from previous. Few scattered foci of pneumocephalus noted along the tentorium and falx as well. There has been interval removal of a right frontal approach EVD. Lateral ventricular size slightly increased from previous but remains within normal limits without hydrocephalus. Third and fourth ventricular size not significantly changed. No acute intracranial hemorrhage. No findings to suggest acute large vessel territory infarct.  Small volume extra-axial collections at the vertex are stable to slightly decreased. Postoperative changes from prior suboccipital craniectomy for tumor resection. Slightly improved edema within the adjacent right cerebellar hemisphere. Fluid collection at the craniectomy site appears slightly increased in size now measuring up to 6.9 cm in transverse diameter. More cephalad extension posterior to the occipital calvarium seen on today's exam. Scattered foci of gas again seen within this collection. Vascular: No hyperdense vessel. Skull: Changes related to suboccipital craniectomy. Right frontal burr hole with overlying skin staples noted. Sinuses/Orbits: Globes and orbital soft tissues within normal limits. Paranasal sinuses and mastoid air cells are clear. Other: None. IMPRESSION: 1. Postoperative changes from prior right suboccipital craniectomy and right cerebellar tumor resection. Postoperative collection/pseudomeningocele at the craniectomy site slightly increased in size as compared to previous exam. 2. Slightly decreased postoperative pneumocephalus as compared to previous. 3. Interval removal of right frontal approach EVD. Slightly increased size of lateral ventricular size without hydrocephalus. 4. No other new intracranial abnormality identified. Electronically Signed   By: Jeannine Boga M.D.   On: 04/03/2017 03:55    Assessment/Plan: Repeat CT of her head this morning if she still has a lot of pneumocephalus we will not be able to perform either shunting or lumbar drainage until the air is gone.  LOS: 28 days     Denise Morton P 04/04/2017, 7:50 AM

## 2017-04-04 NOTE — Progress Notes (Signed)
Patient ID: Denise Morton, female   DOB: 07-20-1950, 67 y.o.   MRN: [id number] CT scan shows persistent large amount of pneumocephalus. In my opinion this precludes any drain placement either lumbar or extra-articular drain. More than likely patient will need a ventriculoperitoneal shunt. So in order to temporize until all the pneumocephalus has resolved I have oversewn the inferior aspect of her incision and we started her on Diamox.

## 2017-04-04 NOTE — Progress Notes (Signed)
Occupational Therapy Treatment Patient Details Name: Donda Friedli MRN: [id number] DOB: 23-Jun-1950 Today's Date: 04/04/2017    History of present illness  Anjelica Crady is a 67 y.o. 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  PMHx of Breast CA, DM, central retinal vein occlusion right eye   OT comments  Pt lethargic today, but interactive.  She continues with diplopia.  Partial occlusion to temporal portion of Rt lens completed to reduce diplopia, while allowing eyes to work binocularly and allow for input peripherally to assist with balance and verticality  Pt reports improved comfort with partial occlusion and and improved ability to read and watch TV.    Follow Up Recommendations  SNF;Supervision/Assistance - 24 hour    Equipment Recommendations  3 in 1 bedside commode;Tub/shower seat    Recommendations for Other Services Rehab consult    Precautions / Restrictions Precautions Precautions: Fall       Mobility Bed Mobility                  Transfers                      Balance                                           ADL either performed or assessed with clinical judgement   ADL                                               Vision   Additional Comments: Pt continues with diplopia and decreased adduction Lt eye.   Pt is Lt eye dominant.   Partial temporal occlusion of glasses to Rt lens completed to reduce impact of diplopia while allowing eyes to work binocularly as well as providing peripheral input to assist with balance.  With occlusion in place, pt reports reduction in diplopia as well as subjective improvement in comfort and ability to read/use  phone and see TV    Perception     Praxis      Cognition Arousal/Alertness: Awake/alert Behavior During Therapy: WFL for tasks assessed/performed Overall Cognitive Status: Impaired/Different from baseline Area of Impairment: Attention;Following commands;Memory;Safety/judgement;Problem solving                   Current Attention Level: Sustained;Selective Memory: Decreased short-term memory Following Commands: Follows one step commands consistently Safety/Judgement: Decreased awareness of safety   Problem Solving: Slow processing;Requires verbal cues          Exercises     Shoulder Instructions       General Comments      Pertinent Vitals/ Pain       Pain Assessment: No/denies pain  Home Living                                          Prior Functioning/Environment  Frequency  Min 3X/week        Progress Toward Goals  OT Goals(current goals can now be found in the care plan section)  Progress towards OT goals: Progressing toward goals     Plan Discharge plan remains appropriate    Co-evaluation                 AM-PAC PT "6 Clicks" Daily Activity     Outcome Measure   Help from another person eating meals?: None Help from another person taking care of personal grooming?: A Little Help from another person toileting, which includes using toliet, bedpan, or urinal?: A Lot Help from another person bathing (including washing, rinsing, drying)?: A Lot Help from another person to put on and taking off regular upper body clothing?: A Little Help from another person to put on and taking off regular lower body clothing?: A Lot 6 Click Score: 16    End of Session    OT Visit Diagnosis: Unsteadiness on feet (R26.81);Cognitive communication deficit (R41.841)   Activity Tolerance Patient tolerated treatment well   Patient Left in bed;with call bell/phone within reach   Nurse Communication          Time:  8546-2703 OT Time Calculation (min): 17 min  Charges: OT General Charges $OT Visit: 1 Visit OT Treatments $Therapeutic Activity: 8-22 mins  [facility], OTR/L [phone]    Lucille Passy M 04/04/2017, 3:30 PM

## 2017-04-05 LAB — CBC
HEMATOCRIT: 29.6 % — AB (ref 36.0–46.0)
Hemoglobin: 9.1 g/dL — ABNORMAL LOW (ref 12.0–15.0)
MCH: 31.5 pg (ref 26.0–34.0)
MCHC: 30.7 g/dL (ref 30.0–36.0)
MCV: 102.4 fL — ABNORMAL HIGH (ref 78.0–100.0)
PLATELETS: 101 10*3/uL — AB (ref 150–400)
RBC: 2.89 MIL/uL — ABNORMAL LOW (ref 3.87–5.11)
RDW: 18.5 % — AB (ref 11.5–15.5)
WBC: 4.9 10*3/uL (ref 4.0–10.5)

## 2017-04-05 LAB — BASIC METABOLIC PANEL
Anion gap: 8 (ref 5–15)
BUN: 24 mg/dL — AB (ref 6–20)
CALCIUM: 8.8 mg/dL — AB (ref 8.9–10.3)
CO2: 25 mmol/L (ref 22–32)
CREATININE: 0.62 mg/dL (ref 0.44–1.00)
Chloride: 107 mmol/L (ref 101–111)
GFR calc Af Amer: >60 mL/min (ref >60)
GLUCOSE: 160 mg/dL — AB (ref 65–99)
POTASSIUM: 3.6 mmol/L (ref 3.5–5.1)
Sodium: 140 mmol/L (ref 135–145)

## 2017-04-05 LAB — GLUCOSE, CAPILLARY
GLUCOSE-CAPILLARY: 167 mg/dL — AB (ref 65–99)
Glucose-Capillary: 134 mg/dL — ABNORMAL HIGH (ref 65–99)
Glucose-Capillary: 163 mg/dL — ABNORMAL HIGH (ref 65–99)
Glucose-Capillary: 104 mg/dL — ABNORMAL HIGH (ref 65–99)

## 2017-04-05 LAB — GENTAMICIN LEVEL, TROUGH: GENTAMICIN TR: 0.9 ug/mL (ref 0.5–2.0)

## 2017-04-05 LAB — VANCOMYCIN, TROUGH: VANCOMYCIN TR: 16 ug/mL (ref 15–20)

## 2017-04-05 MED ORDER — DEXAMETHASONE 2 MG PO TABS
2.0000 mg | ORAL_TABLET | Freq: Two times a day (BID) | ORAL | Status: DC
  Administered 2017-04-05 – 2017-04-14 (×18): 2 mg via ORAL
  Filled 2017-04-05 (×18): qty 1

## 2017-04-05 NOTE — Progress Notes (Signed)
Subjective: Patient reports patient doing well no headache  Objective: Vital signs in last 24 hours: Temp:  [97.6 F (36.4 C)-98.3 F (36.8 C)] 97.7 F (36.5 C) (03/20 0730) Pulse Rate:  [58-87] 58 (03/20 0730) Resp:  [12-20] 15 (03/20 0730) BP: (109-165)/(49-91) 151/72 (03/20 0730) SpO2:  [93 %-100 %] 100 % (03/20 0730)  Intake/Output from previous day: 03/19 0701 - 03/20 0700 In: 880 [P.O.:360; I.V.:220; IV Piggyback:300] Out: 3050 [Urine:3050] Intake/Output this shift: No intake/output data recorded.  incision dry no drainage awake and alert strength 5 out of 5  Lab Results: Recent Labs    04/04/17 0535 04/05/17 0542  WBC 4.5 4.9  HGB 8.8* 9.1*  HCT 27.7* 29.6*  PLT 98* 101*   BMET Recent Labs    04/04/17 0535 04/05/17 0542  NA 138 140  K 4.1 3.6  CL 99* 107  CO2 29 25  GLUCOSE 162* 160*  BUN 28* 24*  CREATININE 0.74 0.62  CALCIUM 8.9 8.8*    Studies/Results: Ct Head Wo Contrast  Result Date: 04/04/2017 CLINICAL DATA:  Followup intracranial hemorrhage. Craniotomy with tumor resection right cerebellum. EXAM: CT HEAD WITHOUT CONTRAST TECHNIQUE: Contiguous axial images were obtained from the base of the skull through the vertex without intravenous contrast. COMPARISON:  04/03/2017. 04/01/2017. 03/27/2017. MRI studies 03/14/2017 and 03/07/2017. FINDINGS: Brain: Previous occipital craniectomy for right cerebellar resection. Fluid collection and packing material at the craniotomy site is getting slightly smaller. Subtotal resection of the right cerebellar mass, with residual punctate foci of calcification along the margins of resection in the right superior surface of the cerebellum, presumed to be associated with some residual tumor tissue. Persistent mass-effect upon the fourth ventricle but without developing hydrocephalus. Less bilateral subdural fluid in air than was seen yesterday. Tiny lesion of the right occipital lobe shown by MRI not visible by CT. Vascular:  There is atherosclerotic calcification of the major vessels at the base of the brain. Skull: No other calvarial finding. Sinuses/Orbits: Clear/normal Other: None IMPRESSION: Diminishing amount bilateral subdural fluid and air. Slight reduction in size of the fluid collection and packing material at the craniotomy site. Similar pattern cerebellar edema with mass-effect upon the fourth ventricle postoperatively as expected. Residual punctate calcifications presumed to be associated with residual tumor tissue. Electronically Signed   By: Nelson Chimes M.D.   On: 04/04/2017 09:40    Assessment/Plan: Continue to observe repeat CT head in the morning.  LOS: 29 days    Ollivander See P 04/05/2017, 8:25 AM

## 2017-04-05 NOTE — Progress Notes (Signed)
PROGRESS NOTE    Denise Morton  JQB:[id number] DOB: [birth year] DOA: 03/07/2017 PCP: Seward Carol, MD  Brief Narrative:67 y.o.femaleWith pmhx of metastatic breast CA, DM, admitted on 03/07/17 with weakness, dizziness, anorexia, speech difficulties and near syncope. MRI brain demonstrated metastatic lesions to the cerebellum and right occipital areas. She was initially admitted to [facility].  Neurosurgery was consulted and patient was transferred to [facility] on 2/23. She underwent suboccipital craniectomy for resection of right cerebellar meton 2/25 by Dr. Saintclair Halsted.  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  Symptomatically she is feeling much better with improved strength, speech, appetite.  Returned to the OR on 3/6 for correction of her CSF leak underwent reexploration of the suboccipital incision for repair of primary CSF leak utilizing DuraMatrix patch with fascial patch grafts.  She has continued to have a slow CSF leak.  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  On 3/12 underwent reexploration of suboccipital craniectomy incision. Primary repair of CSF leak with sewing a myofascial patch graft and R frontal external ventricular drain.   Assessment & Plan:   1. Metastatic breast cancer with cerebellar metastasis status post suboccipital craniotomy and resection of right cerebellar mass -Continue Keppra and Decadron per Dr.Cram -Prophylactic antibiotics per neurosurgery -Appreciate palliative care input  2. CSF leak -Status post multiple vascular grafts and external ventricular drain -per Dr.Cram, CT head tomorrow  4.  Type 2 diabetes -CBG stable, continue Lantus and sliding-scale insulin  5. Hypertension -Stable, continue Norvasc, labetalol,  lisinopril  6. Chronic anemia/thrombocytopenia -secondary to chronic disease and acute blood loss -Monitor  DVT prophylaxis:with SCDs due to brain metastasis and multiple surgeries Code Status: full code Family Communication:no family at bedside Disposition Plan: keep in stepdown   Procedures:  03/13/2017 suboccipital craniectomy for resection of right cerebellar met  03/22/2017:  Wound revision due to CSF leak 03/28/2017:   reexploration of suboccipital craniectomy incision. Primary repair of CSF leak with sewing a myofascial patch graft and R frontal external ventricular drain.  Antimicrobials:    Subjective: -feels ok, no specific complaints  Objective: Vitals:   04/05/17 0413 04/05/17 0730 04/05/17 0853 04/05/17 1133  BP:  (!) 151/72  136/78  Pulse:  (!) 58 69 67  Resp: _0 Temp:  97.7 F (36.5 C)  (!) 97.5 F (36.4 C)  TempSrc:  Oral  Oral  SpO2:  100%  100%  Weight:      Height:        Intake/Output Summary (Last 24 hours) at 04/05/2017 1236 Last data filed at 04/05/2017 1134 Gross per 24 hour  Intake 1330 ml  Output 4050 ml  Net -2720 ml   Filed Weights   03/31/17 0400 04/01/17 0500 04/04/17 0500  Weight: 104.5 kg (230 lb 6.1 oz) 103.5 kg (228 lb 2.8 oz) 103.5 kg (228 lb 2.8 oz)    Examination:  General exam: Appears calm and comfortable, no distress Respiratory system: Clear to auscultation. Respiratory effort normal. Cardiovascular system: S1 & S2 heard, RRR.  Gastrointestinal system: Abdomen is nondistended, soft and nontender.Normal bowel sounds heard. Central nervous system: Alert and oriented. No focal neurological deficits. Extremities: strength 3+/5, bilaterally in both legs Skin: No rashes, lesions or ulcers Psychiatry: Judgement and insight appear normal. Mood & affect appropriate.     Data Reviewed:  CBC: Recent Labs  Lab 04/01/17 0621 04/02/17 0500 04/03/17 0509 04/04/17 0535 04/05/17 0542  WBC 5.4 5.5 4.9 4.5 4.9  HGB  9.4* 8.9* 8.7* 8.8* 9.1*  HCT 29.8* 27.7* 28.1* 27.7* 29.6*  MCV 100.7* 100.7* 100.7* 101.8* 102.4*  PLT 109* 98* 101* 98* 573*   Basic Metabolic Panel: Recent Labs  Lab 04/01/17 0621 04/02/17 0500 04/03/17 0509 04/04/17 0535 04/05/17 0542  NA 136 138 137 138 140  K 4.5 4.5 4.5 4.1 3.6  CL 103 105 100* 99* 107  CO2 _0 GLUCOSE 162* 91 159* 162* 160*  BUN 33* 30* 28* 28* 24*  CREATININE 0.77 0.74 0.71 0.74 0.62  CALCIUM 8.7* 8.7* 8.8* 8.9 8.8*   GFR: Estimated Creatinine Clearance: 84.1 mL/min (by C-G formula based on SCr of 0.62 mg/dL). Liver Function Tests: No results for input(s): AST, ALT, ALKPHOS, BILITOT, PROT, ALBUMIN in the last 168 hours. No results for input(s): LIPASE, AMYLASE in the last 168 hours. No results for input(s): AMMONIA in the last 168 hours. Coagulation Profile: No results for input(s): INR, PROTIME in the last 168 hours. Cardiac Enzymes: No results for input(s): CKTOTAL, CKMB, CKMBINDEX, TROPONINI in the last 168 hours. BNP (last 3 results) No results for input(s): PROBNP in the last 8760 hours. HbA1C: No results for input(s): HGBA1C in the last 72 hours. CBG: Recent Labs  Lab 04/04/17 1150 04/04/17 1647 04/04/17 2112 04/05/17 0735 04/05/17 1132  GLUCAP 201* 154* 201* 167* 134*   Lipid Profile: No results for input(s): CHOL, HDL, LDLCALC, TRIG, CHOLHDL, LDLDIRECT in the last 72 hours. Thyroid Function Tests: No results for input(s): TSH, T4TOTAL, FREET4, T3FREE, THYROIDAB in the last 72 hours. Anemia Panel: No results for input(s): VITAMINB12, FOLATE, FERRITIN, TIBC, IRON, RETICCTPCT in the last 72 hours. Urine analysis: No results found for: COLORURINE, APPEARANCEUR, LABSPEC, PHURINE, GLUCOSEU, HGBUR, BILIRUBINUR, KETONESUR, PROTEINUR, UROBILINOGEN, NITRITE, LEUKOCYTESUR Sepsis Labs: _1 (procalcitonin:4,lacticidven:4)  ) Recent Results (from the past 240 hour(s))  CSF culture with Stat gram stain     Status: None    Collection Time: 04/01/17 10:24 AM  Result Value Ref Range Status   Specimen Description CSF DRAWN FROM IVC  Final   Special Requests Normal  Final   Gram Stain   Final    FEW WBC PRESENT,BOTH PMN AND MONONUCLEAR NO ORGANISMS SEEN    Culture   Final    NO GROWTH 3 DAYS Performed at [facility], [address], [place], [place] [address]    Report Status 04/04/2017 FINAL  Final         Radiology Studies: Ct Head Wo Contrast  Result Date: 04/04/2017 CLINICAL DATA:  Followup intracranial hemorrhage. Craniotomy with tumor resection right cerebellum. EXAM: CT HEAD WITHOUT CONTRAST TECHNIQUE: Contiguous axial images were obtained from the base of the skull through the vertex without intravenous contrast. COMPARISON:  04/03/2017. 04/01/2017. 03/27/2017. MRI studies 03/14/2017 and 03/07/2017. FINDINGS: Brain: Previous occipital craniectomy for right cerebellar resection. Fluid collection and packing material at the craniotomy site is getting slightly smaller. Subtotal resection of the right cerebellar mass, with residual punctate foci of calcification along the margins of resection in the right superior surface of the cerebellum, presumed to be associated with some residual tumor tissue. Persistent mass-effect upon the fourth ventricle but without developing hydrocephalus. Less bilateral subdural fluid in air than was seen yesterday. Tiny lesion of the right occipital lobe shown by MRI not visible by CT. Vascular: There is atherosclerotic calcification of the  major vessels at the base of the brain. Skull: No other calvarial finding. Sinuses/Orbits: Clear/normal Other: None IMPRESSION: Diminishing amount bilateral subdural fluid and air. Slight reduction in size of the fluid collection and packing material at the craniotomy site. Similar pattern cerebellar edema with mass-effect upon the fourth ventricle postoperatively as expected. Residual punctate calcifications presumed to be associated  with residual tumor tissue. Electronically Signed   By: Nelson Chimes M.D.   On: 04/04/2017 09:40        Scheduled Meds: . acetaZOLAMIDE  250 mg Oral QID  . amLODipine  10 mg Oral Daily  . Chlorhexidine Gluconate Cloth  6 each Topical Q0600  . dexamethasone  2 mg Oral Q12H  . docusate sodium  100 mg Oral BID  . doxycycline  100 mg Oral Q12H  . feeding supplement (GLUCERNA SHAKE)  237 mL Oral BID BM  . ferrous sulfate  325 mg Oral BID WC  . insulin aspart  0-20 Units Subcutaneous TID WC  . insulin aspart  0-5 Units Subcutaneous QHS  . insulin aspart  10 Units Subcutaneous TID WC  . insulin glargine  24 Units Subcutaneous QHS  . labetalol  200 mg Oral TID  . levETIRAcetam  500 mg Oral BID  . lisinopril  5 mg Oral Daily  . mirtazapine  15 mg Oral QHS  . nystatin-triamcinolone   Topical BID  . pantoprazole  40 mg Oral QHS  . polyethylene glycol  17 g Oral Daily  . senna  1 tablet Oral BID  . sodium chloride flush  10-40 mL Intracatheter Q12H   Continuous Infusions: . sodium chloride 10 mL/hr at 04/04/17 1600  . gentamicin Stopped (04/05/17 1000)  . vancomycin Stopped (04/05/17 0719)     LOS: 29 days    Time spent:be 44mn    PDomenic Polite MD [facility] Page via [email or web address], password [email or web address] After 7PM please contact night-coverage  04/05/2017, 12:36 PM

## 2017-04-05 NOTE — Progress Notes (Signed)
Pharmacy Antibiotic Note  Denise Morton is a 67 y.o. female admitted on 03/07/2017 with surgical prophylaxis.   PT is s/p ventriculostomy. Continues on vancomycin and gentamicin until CSF leak stops per Dr Saintclair Halsted  Vanc tr - 16 Gent tr - 0.9  Plan: Continue vancomycin 750 mg iv Q 12 Continue gentamicin 500 mg iv Q 36 hours Monitor renal fx  Height: 5\' 6"  (167.6 cm) Weight: 228 lb 2.8 oz (103.5 kg) IBW/kg (Calculated) : 59.3  Temp (24hrs), Avg:97.9 F (36.6 C), Min:97.6 F (36.4 C), Max:98.3 F (36.8 C)  Recent Labs  Lab 03/31/17 1729 04/01/17 0621 04/02/17 0500 04/03/17 0509 04/04/17 0535 04/05/17 0542  WBC  --  5.4 5.5 4.9 4.5 4.9  CREATININE  --  0.77 0.74 0.71 0.74 0.62  VANCOTROUGH 15  --   --   --   --  16  GENTTROUGH  --   --   --   --   --  0.9  GENTRANDOM  --   --  1.2  --   --   --     Estimated Creatinine Clearance: 84.1 mL/min (by C-G formula based on SCr of 0.62 mg/dL).    Allergies  Allergen Reactions  . Penicillin G Rash    Has patient had a PCN reaction causing immediate rash, facial/tongue/throat swelling, SOB or lightheadedness with hypotension: Unknown Has patient had a PCN reaction causing severe rash involving mucus membranes or skin necrosis: Unknown Has patient had a PCN reaction that required hospitalization: Unknown Has patient had a PCN reaction occurring within the last 10 years: Unknown If all of the above answers are "NO", then may proceed with Cephalosporin use.   [name] Antibiotics Rash   Levester Fresh, PharmD, BCPS, BCCCP Clinical Pharmacist Clinical phone for 04/05/2017 from 7a-3:30p: [phone] If after 3:30p, please call main pharmacy at: [phone] 04/05/2017 9:12 AM

## 2017-04-06 ENCOUNTER — Inpatient Hospital Stay (HOSPITAL_COMMUNITY): Payer: PPO

## 2017-04-06 DIAGNOSIS — Z66 Do not resuscitate: Secondary | ICD-10-CM

## 2017-04-06 LAB — GLUCOSE, CAPILLARY
GLUCOSE-CAPILLARY: 177 mg/dL — AB (ref 65–99)
GLUCOSE-CAPILLARY: 115 mg/dL — AB (ref 65–99)
Glucose-Capillary: 217 mg/dL — ABNORMAL HIGH (ref 65–99)
Glucose-Capillary: 150 mg/dL — ABNORMAL HIGH (ref 65–99)

## 2017-04-06 LAB — CBC
HEMATOCRIT: 30.4 % — AB (ref 36.0–46.0)
HEMOGLOBIN: 9.4 g/dL — AB (ref 12.0–15.0)
MCH: 32.3 pg (ref 26.0–34.0)
MCHC: 30.9 g/dL (ref 30.0–36.0)
MCV: 104.5 fL — ABNORMAL HIGH (ref 78.0–100.0)
Platelets: 102 10*3/uL — ABNORMAL LOW (ref 150–400)
RBC: 2.91 MIL/uL — AB (ref 3.87–5.11)
RDW: 19 % — ABNORMAL HIGH (ref 11.5–15.5)
WBC: 4.4 10*3/uL (ref 4.0–10.5)

## 2017-04-06 LAB — BASIC METABOLIC PANEL
ANION GAP: 8 (ref 5–15)
BUN: 28 mg/dL — ABNORMAL HIGH (ref 6–20)
CALCIUM: 8.3 mg/dL — AB (ref 8.9–10.3)
CO2: 21 mmol/L — ABNORMAL LOW (ref 22–32)
Chloride: 108 mmol/L (ref 101–111)
Creatinine, Ser: 0.77 mg/dL (ref 0.44–1.00)
GFR calc Af Amer: >60 mL/min (ref >60)
Glucose, Bld: 149 mg/dL — ABNORMAL HIGH (ref 65–99)
POTASSIUM: 3.9 mmol/L (ref 3.5–5.1)
Sodium: 137 mmol/L (ref 135–145)

## 2017-04-06 NOTE — Progress Notes (Signed)
Occupational Therapy Treatment Patient Details Name: Denise Morton MRN: [id number] DOB: 1950/11/23 Today's Date: 04/06/2017    History of present illness  Denise Morton is a 67 y.o. 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  PMHx of Breast CA, DM, central retinal vein occlusion right eye   OT comments  This 67 yo female admitted with above presents to acute OT with continuing to work on vision and education on vision. She continues with decreased adduction of left eye and intermittent exotropia of right eye. Not reporting double vision when using taped glasses. Pt will continue to benefit from acute OT with follow up at SNF.   Follow Up Recommendations  SNF;Supervision/Assistance - 24 hour    Equipment Recommendations  3 in 1 bedside commode;Tub/shower seat       Precautions / Restrictions Precautions Precautions: Fall Restrictions Weight Bearing Restrictions: No       Mobility Bed Mobility Overal bed mobility: Needs Assistance Bed Mobility: Rolling Rolling: Min guard         General bed mobility comments: Scooting self up in bed with bed in trendelenberg, use of rails and pushing with feet, pt needed min A and VCs        ADL either performed or assessed with clinical judgement        Vision Baseline Vision/History: Wears glasses Wears Glasses: At all times Patient Visual Report: Blurring of vision;Diplopia Additional Comments: Blurring when glasses are on is more due to tape causing blurriness than double vision (taping continues to provide her with single vision). Her left eye still only comes to midline (will not fully adduct). When attempting convergence of eyes left eye stays at midline and right  eye will not converge as well (but rather goes exotropic), even when she starts with eyes to far left as you have her try to maintain this as she focuses on object coming midline--her right eye slowly goes exotropic. With left eye occluded her right eye will remain nasally. With eyes open and taped glasses on she initally had a hard time touching the top of my pen in multiple directions, but then was able to touch it every time. Pt reports the taped glasses make it much easier and nicer to watch TV.          Cognition Arousal/Alertness: Awake/alert Behavior During Therapy: WFL for tasks assessed/performed Overall Cognitive Status: Impaired/Different from baseline Area of Impairment: Following commands                       Following Commands: Follows one step commands consistently       General Comments: Pt reports that PT had her name 3 animals that started with a "D" today, when asked what those where she said "deer and I don't remember what else". Pt asked what the taping on her glasses was for, I explained to help with her double vision. I had her take her glasses off and tell me how many of several different things in the room she saw and they were all 2, then asked her to put glasses back on and asked her about the same things and she said 1. I explained that it allows her to still see out of that  eye (but blurry) while still allowing her to have light coming in (that if we patched her eye then she wouldn't be able to see anything out of the eye that was patched. She responded that now she could tell her friends since they were asking about the tape                   Pertinent Vitals/ Pain       Pain Assessment: No/denies pain         Frequency  Min 3X/week        Progress Toward Goals  OT Goals(current goals can now be found in the care plan section)  Progress towards OT goals: Progressing toward goals     Plan Discharge plan remains appropriate        AM-PAC PT "6 Clicks" Daily Activity     Outcome Measure   Help from another person eating meals?: None Help from another person taking care of personal grooming?: A Little(set up/S) Help from another person toileting, which includes using toliet, bedpan, or urinal?: A Lot Help from another person bathing (including washing, rinsing, drying)?: A Lot Help from another person to put on and taking off regular upper body clothing?: A Little Help from another person to put on and taking off regular lower body clothing?: Total 6 Click Score: 15    End of Session    OT Visit Diagnosis: Unsteadiness on feet (R26.81);Other symptoms and signs involving cognitive function(diplopia)   Activity Tolerance Patient tolerated treatment well   Patient Left in bed   Nurse Communication          Time: 3664-4034 OT Time Calculation (min): 22 min  Charges: OT General Charges $OT Visit: 1 Visit OT Treatments $Therapeutic Activity: 8-22 mins  Golden Circle, OTR/L [phone] 04/06/2017

## 2017-04-06 NOTE — Progress Notes (Signed)
Patient ID: Denise Morton, female   DOB: 1950/10/05, 67 y.o.   MRN: [id number] Patient doing better no headache neck drycertainly there is a soft fluid collection but it's not actively leaking.  Pupil size decreased a little more medial rectus involvement so I think her third nerve is improving.

## 2017-04-06 NOTE — Social Work (Signed)
CSW continuing to follow to support disposition when medically appropriate.   Alexander Mt, [place] Work [phone]

## 2017-04-06 NOTE — Progress Notes (Signed)
PROGRESS NOTE    Denise Morton  KDT:[id number] DOB: [birth year] DOA: 03/07/2017 PCP: Seward Carol, MD  Brief Narrative:66 y.o.femaleWith pmhx of metastatic breast CA, DM, admitted on 03/07/17 with weakness, dizziness, anorexia, speech difficulties and near syncope. MRI brain demonstrated metastatic lesions to the cerebellum and right occipital areas. She was initially admitted to [facility].  Neurosurgery was consulted and patient was transferred to [facility] on 2/23. She underwent suboccipital craniectomy for resection of right cerebellar meton 2/25 by Dr. Saintclair Halsted.  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  Symptomatically she is feeling much better with improved strength, speech, appetite.  Returned to the OR on 3/6 for correction of her CSF leak underwent reexploration of the suboccipital incision for repair of primary CSF leak utilizing DuraMatrix patch with fascial patch grafts.  She has continued to have a slow CSF leak.  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  On 3/12 underwent reexploration of suboccipital craniectomy incision. Primary repair of CSF leak with sewing a myofascial patch graft and R frontal external ventricular drain.   Assessment & Plan:   1. Metastatic breast cancer with cerebellar metastasis status post suboccipital craniotomy and resection of right cerebellar mass -Continue Keppra and Decadron per Dr.Cram -Appreciate palliative care input, discussed with pt regarding need to consider DNR  2. CSF leak -Status post multiple vascular grafts and external ventricular drain -per Dr.Cram on prophylactic antibiotics, CT head stable-pneumocephalus persists -monitor  4.  Type 2 diabetes -CBG stable, continue Lantus and sliding-scale insulin  5.  Hypertension -Stable, continue Norvasc, labetalol, lisinopril  6. Chronic anemia/thrombocytopenia -secondary to chronic disease and acute blood loss -Monitor  DVT prophylaxis:with SCDs due to brain metastasis and multiple surgeries Code Status: full code Family Communication:no family at bedside Disposition Plan: keep in stepdown, SNF with pneumocephalus resolved   Procedures:  03/13/2017 suboccipital craniectomy for resection of right cerebellar met  03/22/2017:  Wound revision due to CSF leak 03/28/2017:   reexploration of suboccipital craniectomy incision. Primary repair of CSF leak with sewing a myofascial patch graft and R frontal external ventricular drain.  Antimicrobials:    Subjective: -stable, no headache, ok overall  Objective: Vitals:   04/06/17 0748 04/06/17 0915 04/06/17 1149 04/06/17 1154  BP: 117/85 109/77 (!) 96/52   Pulse: 66  74 74  Resp: 15  18   Temp: (!) 97.5 F (36.4 C)  97.9 F (36.6 C)   TempSrc: Oral  Oral   SpO2: 96%  98%   Weight:      Height:        Intake/Output Summary (Last 24 hours) at 04/06/2017 1339 Last data filed at 04/06/2017 9983 Gross per 24 hour  Intake 822.5 ml  Output 1900 ml  Net -1077.5 ml   Filed Weights   03/31/17 0400 04/01/17 0500 04/04/17 0500  Weight: 104.5 kg (230 lb 6.1 oz) 103.5 kg (228 lb 2.8 oz) 103.5 kg (228 lb 2.8 oz)    Examination:  Gen: Awake, Alert, Oriented X 3, chronically ill female HEENT: PERRLA, Neck supple, no JVD Lungs: Good air movement bilaterally, CTAB CVS: RRR,No Gallops,Rubs or new Murmurs Abd: soft, Non tender, non distended, BS present Extremities: strength 3+/5, bilaterally in both legs Skin: no new rashes Psychiatry: Judgement and insight appear normal. Mood & affect appropriate.     Data Reviewed:  CBC: Recent Labs  Lab 04/02/17 0500 04/03/17 0509 04/04/17 0535 04/05/17 0542 04/06/17 0620  WBC 5.5 4.9 4.5 4.9 4.4  HGB 8.9* 8.7* 8.8* 9.1* 9.4*  HCT 27.7* 28.1* 27.7*  29.6* 30.4*  MCV 100.7* 100.7* 101.8* 102.4* 104.5*  PLT 98* 101* 98* 101* 580*   Basic Metabolic Panel: Recent Labs  Lab 04/02/17 0500 04/03/17 0509 04/04/17 0535 04/05/17 0542 04/06/17 0620  NA 138 137 138 140 137  K 4.5 4.5 4.1 3.6 3.9  CL 105 100* 99* 107 108  CO2 27 28 29 25  21*  GLUCOSE 91 159* 162* 160* 149*  BUN 30* 28* 28* 24* 28*  CREATININE 0.74 0.71 0.74 0.62 0.77  CALCIUM 8.7* 8.8* 8.9 8.8* 8.3*   GFR: Estimated Creatinine Clearance: 84.1 mL/min (by C-G formula based on SCr of 0.77 mg/dL). Liver Function Tests: No results for input(s): AST, ALT, ALKPHOS, BILITOT, PROT, ALBUMIN in the last 168 hours. No results for input(s): LIPASE, AMYLASE in the last 168 hours. No results for input(s): AMMONIA in the last 168 hours. Coagulation Profile: No results for input(s): INR, PROTIME in the last 168 hours. Cardiac Enzymes: No results for input(s): CKTOTAL, CKMB, CKMBINDEX, TROPONINI in the last 168 hours. BNP (last 3 results) No results for input(s): PROBNP in the last 8760 hours. HbA1C: No results for input(s): HGBA1C in the last 72 hours. CBG: Recent Labs  Lab 04/05/17 1132 04/05/17 1734 04/05/17 2130 04/06/17 0751 04/06/17 1151  GLUCAP 134* 163* 104* 177* 115*   Lipid Profile: No results for input(s): CHOL, HDL, LDLCALC, TRIG, CHOLHDL, LDLDIRECT in the last 72 hours. Thyroid Function Tests: No results for input(s): TSH, T4TOTAL, FREET4, T3FREE, THYROIDAB in the last 72 hours. Anemia Panel: No results for input(s): VITAMINB12, FOLATE, FERRITIN, TIBC, IRON, RETICCTPCT in the last 72 hours. Urine analysis: No results found for: COLORURINE, APPEARANCEUR, LABSPEC, PHURINE, GLUCOSEU, HGBUR, BILIRUBINUR, KETONESUR, PROTEINUR, UROBILINOGEN, NITRITE, LEUKOCYTESUR Sepsis Labs: @LABRCNTIP (procalcitonin:4,lacticidven:4)  ) Recent Results (from the past 240 hour(s))  CSF culture with Stat gram stain     Status: None   Collection Time: 04/01/17 10:24 AM  Result  Value Ref Range Status   Specimen Description CSF DRAWN FROM IVC  Final   Special Requests Normal  Final   Gram Stain   Final    FEW WBC PRESENT,BOTH PMN AND MONONUCLEAR NO ORGANISMS SEEN    Culture   Final    NO GROWTH 3 DAYS Performed at [facility], [address], [place], [place] [address]    Report Status 04/04/2017 FINAL  Final         Radiology Studies: Ct Head Wo Contrast  Result Date: 04/06/2017 CLINICAL DATA:  Follow-up examination status post intracranial hemorrhage, craniotomy with right cerebellar tumor resection. EXAM: CT HEAD WITHOUT CONTRAST TECHNIQUE: Contiguous axial images were obtained from the base of the skull through the vertex without intravenous contrast. COMPARISON:  Prior CT from 04/04/2017. FINDINGS: Brain: Postoperative changes from prior occipital craniectomy for right cerebellar tumor resection fluid collection at the craniectomy site overall little interval changed. Subtotal resection of the right cerebellar vas with residual calcification along the resection cavity is similar to previous. Similar edema with mild mass effect on the adjacent fourth ventricle without hydrocephalus. Extra-axial pneumocephalus overlying the frontal convexities is little interval changed. Otherwise stable appearance of the brain. No new intracranial hemorrhage. No new large vessel territory infarct. Vascular: No hyperdense vessel. Scattered vascular calcifications noted within the carotid siphons. Skull: Occipital craniectomy with right frontal burr  hole. No new scalp soft tissue abnormality. Sinuses/Orbits: Globes and orbital soft tissues demonstrate no acute finding. Paranasal sinuses and mastoid air cells remain clear. Other: None. IMPRESSION: 1. Stable exam with overall similar size of fluid collection at the craniectomy site. Extra-axial pneumocephalus little interval change from previous. 2. Similar right cerebellar edema with mild mass effect on the adjacent fourth  ventricle. No hydrocephalus. 3. No other new intracranial abnormality. Electronically Signed   By: Jeannine Boga M.D.   On: 04/06/2017 01:05        Scheduled Meds: . acetaZOLAMIDE  250 mg Oral QID  . amLODipine  10 mg Oral Daily  . Chlorhexidine Gluconate Cloth  6 each Topical Q0600  . dexamethasone  2 mg Oral Q12H  . docusate sodium  100 mg Oral BID  . doxycycline  100 mg Oral Q12H  . feeding supplement (GLUCERNA SHAKE)  237 mL Oral BID BM  . ferrous sulfate  325 mg Oral BID WC  . insulin aspart  0-20 Units Subcutaneous TID WC  . insulin aspart  0-5 Units Subcutaneous QHS  . insulin aspart  10 Units Subcutaneous TID WC  . insulin glargine  24 Units Subcutaneous QHS  . labetalol  200 mg Oral TID  . levETIRAcetam  500 mg Oral BID  . lisinopril  5 mg Oral Daily  . mirtazapine  15 mg Oral QHS  . nystatin-triamcinolone   Topical BID  . pantoprazole  40 mg Oral QHS  . polyethylene glycol  17 g Oral Daily  . senna  1 tablet Oral BID  . sodium chloride flush  10-40 mL Intracatheter Q12H   Continuous Infusions: . sodium chloride 10 mL/hr at 04/06/17 0924  . gentamicin Stopped (04/05/17 1000)  . vancomycin Stopped (04/06/17 0713)     LOS: 30 days    Time spent:be 42mn    PDomenic Polite MD [facility] Page via [email or web address], password [email or web address] After 7PM please contact night-coverage  04/06/2017, 1:39 PM

## 2017-04-06 NOTE — Progress Notes (Signed)
Patient ID: Denise Morton, female   DOB: 28-Jun-1950, 67 y.o.   MRN: [id number]  This NP visited patient at the bedside as a follow up for palliative medicine needs and emotional support.   Patient sleeping but arouses easily.   Patient has o/c of headache, pain, dizziness, visual changes, weakness.  Complicated hospitalization 2/2, metastatic breast cancer;   CT and MRI for large Right cerebellar met as well as a smaller right occipital met, s/p craniotomy for resection followed by CSF leak.    Patient is postop  from reexploration and re-repair of primary CSF leak with placement of right frontal external ventricular drain. Future medical interventions related to CSF leak,  pending outcomes over the next several days.   She is alert and oriented,  patient's ultimate goal is to  discharge from hospital and proceed with short-term rehab in hopes of continued oncology interventions to treat her cancer and extend quality life.  Met with her two sons at bedside, Shanon Brow and Quillian Quince, to  continue conversation regarding diagnosis, prognosis, goals of care, advanced directives and anticipatory care needs.  Patient was able to verbalize her desire to document a DNR/DNI status and speak to the fact that she would never want artificial feeding now or in the future.  Both of her son support her decisions  We discussed the current medical situation and the importance of continued conversation regarding  overall plan of care and treatment options,  ensuring decisions are within the context of the patients values and GOCs.  Patient and her family will  face  treatment option decisions , advanced directive decisions,and anticipatory care needs along the course of this disease trajectory.  Patient  is open to all offered and available medical interventions to prolong life  Questions and concerns addressed   Discussed with Dr Broadus John  Time in  1300     Time out  1345    Total time spent on the unit was 45    minutes  Greater than 50% of the time was spent in counseling and coordination of care  Wadie Lessen NP  Palliative Medicine Team Team Phone # [phone] Pager [phone]

## 2017-04-06 NOTE — Progress Notes (Signed)
Physical Therapy Treatment Patient Details Name: Denise Morton MRN: [id number] DOB: 12-11-1950 Today's Date: 04/06/2017    History of Present Illness  Denise Morton is a 67 y.o. 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  PMHx of Breast CA, DM, central retinal vein occlusion right eye    PT Comments    Pt with improved gait, transfers and function this session. Pt continues to demonstrate increased cognitive deficits from prior sessions but increasing functional mobility. Pt educated for transfers, HEP and progression with encouragement to mobilize daily with nursing assist.     Follow Up Recommendations  Supervision/Assistance - 24 hour;SNF     Equipment Recommendations  Rolling walker with 5" wheels    Recommendations for Other Services       Precautions / Restrictions Precautions Precautions: Fall Restrictions Weight Bearing Restrictions: No    Mobility  Bed Mobility Overal bed mobility: Needs Assistance Bed Mobility: Supine to Sit     Supine to sit: Min guard;HOB elevated     General bed mobility comments: HOB 30 degrees with use of rail and increased time, guarding for lines and safety but no physical assist to right   Transfers Overall transfer level: Needs assistance   Transfers: Sit to/from Stand Sit to Stand: Min assist         General transfer comment: min assist to stand from elevated bed, chair x 2 and bSC with cues for hand placement and assist to rise.   Ambulation/Gait Ambulation/Gait assistance: Min assist;+2 safety/equipment Ambulation Distance (Feet): 30 Feet Assistive device: Rolling walker (2 wheeled) Gait Pattern/deviations: Step-to pattern;Decreased stride length;Wide base of  support;Trunk flexed   Gait velocity interpretation: Below normal speed for age/gender General Gait Details: cues for posture, looking up and safety. Pt able to maintain self with RW today and chair to follow for fatigue. Pt walked 30' then 5' limited by fatigue   Stairs            Wheelchair Mobility    Modified Rankin (Stroke Patients Only)       Balance                                            Cognition Arousal/Alertness: Awake/alert Behavior During Therapy: WFL for tasks assessed/performed Overall Cognitive Status: Impaired/Different from baseline Area of Impairment: Attention;Following commands;Memory;Safety/judgement;Problem solving                 Orientation Level: Time Current Attention Level: Selective Memory: Decreased short-term memory Following Commands: Follows one step commands consistently Safety/Judgement: Decreased awareness of safety   Problem Solving: Slow processing;Requires verbal cues General Comments: pt unable to name 3 animals today that start with the same letter which she previously has done each session.       Exercises General Exercises - Lower Extremity Long Arc Quad: AROM;Seated;Both;15 reps Hip Flexion/Marching: AROM;Both;Seated;15 reps    General Comments        Pertinent Vitals/Pain Pain Assessment: No/denies pain    Home Living                      Prior Function  PT Goals (current goals can now be found in the care plan section) Progress towards PT goals: Progressing toward goals    Frequency           PT Plan Current plan remains appropriate    Co-evaluation              AM-PAC PT "6 Clicks" Daily Activity  Outcome Measure  Difficulty turning over in bed (including adjusting bedclothes, sheets and blankets)?: A Lot Difficulty moving from lying on back to sitting on the side of the bed? : A Lot Difficulty sitting down on and standing up from a chair  with arms (e.g., wheelchair, bedside commode, etc,.)?: Unable Help needed moving to and from a bed to chair (including a wheelchair)?: A Lot Help needed walking in hospital room?: A Lot Help needed climbing 3-5 steps with a railing? : Total 6 Click Score: 10    End of Session Equipment Utilized During Treatment: Gait belt Activity Tolerance: Patient tolerated treatment well Patient left: in chair;with call bell/phone within reach;with chair alarm set Nurse Communication: Mobility status;Precautions PT Visit Diagnosis: Other symptoms and signs involving the nervous system (R29.898);Muscle weakness (generalized) (M62.81);Other abnormalities of gait and mobility (R26.89)     Time: 2993-7169 PT Time Calculation (min) (ACUTE ONLY): 25 min  Charges:  $Gait Training: 8-22 mins $Therapeutic Exercise: 8-22 mins                    G Codes:       Elwyn Reach, PT [phone]    [place] 04/06/2017, 10:42 AM

## 2017-04-07 LAB — GLUCOSE, CAPILLARY
GLUCOSE-CAPILLARY: 120 mg/dL — AB (ref 65–99)
GLUCOSE-CAPILLARY: 158 mg/dL — AB (ref 65–99)
GLUCOSE-CAPILLARY: 132 mg/dL — AB (ref 65–99)
Glucose-Capillary: 104 mg/dL — ABNORMAL HIGH (ref 65–99)

## 2017-04-07 LAB — CBC
HEMATOCRIT: 28.8 % — AB (ref 36.0–46.0)
HEMOGLOBIN: 9.3 g/dL — AB (ref 12.0–15.0)
MCH: 33.3 pg (ref 26.0–34.0)
MCHC: 32.3 g/dL (ref 30.0–36.0)
MCV: 103.2 fL — AB (ref 78.0–100.0)
Platelets: 105 10*3/uL — ABNORMAL LOW (ref 150–400)
RBC: 2.79 MIL/uL — ABNORMAL LOW (ref 3.87–5.11)
RDW: 18.8 % — ABNORMAL HIGH (ref 11.5–15.5)
WBC: 4.2 10*3/uL (ref 4.0–10.5)

## 2017-04-07 LAB — BASIC METABOLIC PANEL
Anion gap: 8 (ref 5–15)
BUN: 29 mg/dL — ABNORMAL HIGH (ref 6–20)
CALCIUM: 8.1 mg/dL — AB (ref 8.9–10.3)
CHLORIDE: 111 mmol/L (ref 101–111)
CO2: 20 mmol/L — AB (ref 22–32)
Creatinine, Ser: 0.68 mg/dL (ref 0.44–1.00)
GFR calc non Af Amer: >60 mL/min (ref >60)
GLUCOSE: 165 mg/dL — AB (ref 65–99)
POTASSIUM: 3.5 mmol/L (ref 3.5–5.1)
Sodium: 139 mmol/L (ref 135–145)

## 2017-04-07 LAB — MRSA PCR SCREENING: MRSA by PCR: NEGATIVE

## 2017-04-07 NOTE — Social Work (Signed)
CSW received call from [place], admissions liaison at [facility], pt family has spoken with facility and they will be following pt for admission to SNF whenever medically appropriate. [place], [place] Work [phone]

## 2017-04-07 NOTE — Progress Notes (Signed)
Physical Therapy Treatment Patient Details Name: Denise Morton MRN: [id number] DOB: January 26, 1950 Today's Date: 04/07/2017    History of Present Illness  Denise Morton is a 67 y.o. 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  PMHx of Breast CA, DM, central retinal vein occlusion right eye    PT Comments    Pt with improved gait tolerance today but decreased ability to rise for standing. Pt able to name 3 animals with "B" which she has been unable to do for over a week. Pt continues to have decreased STM and problem solving with noted minimal nystagmus of right eye today with diplopia still present. Will continue to follow.     Follow Up Recommendations  Supervision/Assistance - 24 hour;SNF     Equipment Recommendations  Rolling walker with 5" wheels    Recommendations for Other Services       Precautions / Restrictions Precautions Precautions: Fall Restrictions Weight Bearing Restrictions: No    Mobility  Bed Mobility Overal bed mobility: Needs Assistance Bed Mobility: Supine to Sit     Supine to sit: Supervision;HOB elevated     General bed mobility comments: pt able to use rail with cues and pivot to EOB without physical assist and increased time  Transfers Overall transfer level: Needs assistance   Transfers: Sit to/from Stand Sit to Stand: Mod assist         General transfer comment: mod assist to stand from bed and chair x 2 today with cues for hand placement, assist to rise  Ambulation/Gait Ambulation/Gait assistance: Min assist;+2 safety/equipment Ambulation Distance (Feet): 30 Feet Assistive device: Rolling walker (2 wheeled) Gait Pattern/deviations: Decreased stride length;Wide base of  support;Trunk flexed;Step-through pattern Gait velocity: decreased speed  Gait velocity interpretation: Below normal speed for age/gender General Gait Details: cues for posture, looking up and safety. Pt able to maintain self with RW today and chair to follow for fatigue. Pt walked 30' then 20' limited by fatigue   Stairs            Wheelchair Mobility    Modified Rankin (Stroke Patients Only)       Balance Overall balance assessment: Needs assistance   Sitting balance-Leahy Scale: Fair       Standing balance-Leahy Scale: Poor                              Cognition Arousal/Alertness: Awake/alert Behavior During Therapy: WFL for tasks assessed/performed Overall Cognitive Status: Impaired/Different from baseline                     Current Attention Level: Selective Memory: Decreased short-term memory Following Commands: Follows one step commands consistently Safety/Judgement: Decreased awareness of safety   Problem Solving: Slow processing;Requires verbal cues General Comments: pt able to state 3 animals with a B today, pt still unable to recall events from prior day like how she moved after seeing therapy      Exercises General Exercises - Lower Extremity Long Arc Quad: AROM;Seated;Both;20 reps Hip Flexion/Marching: AROM;Both;Seated;20 reps    General Comments        Pertinent Vitals/Pain Pain Assessment: No/denies pain    Home Living  Prior Function            PT Goals (current goals can now be found in the care plan section) Progress towards PT goals: Progressing toward goals    Frequency           PT Plan Current plan remains appropriate    Co-evaluation              AM-PAC PT "6 Clicks" Daily Activity  Outcome Measure  Difficulty turning over in bed (including adjusting bedclothes, sheets and blankets)?: A Little Difficulty moving from lying on back to sitting on the side of  the bed? : A Lot Difficulty sitting down on and standing up from a chair with arms (e.g., wheelchair, bedside commode, etc,.)?: Unable Help needed moving to and from a bed to chair (including a wheelchair)?: A Lot Help needed walking in hospital room?: A Lot Help needed climbing 3-5 steps with a railing? : Total 6 Click Score: 11    End of Session Equipment Utilized During Treatment: Gait belt Activity Tolerance: Patient tolerated treatment well Patient left: in chair;with call bell/phone within reach;with chair alarm set Nurse Communication: Mobility status;Precautions PT Visit Diagnosis: Other symptoms and signs involving the nervous system (R29.898);Muscle weakness (generalized) (M62.81);Other abnormalities of gait and mobility (R26.89)     Time: 4327-6147 PT Time Calculation (min) (ACUTE ONLY): 26 min  Charges:  $Gait Training: 8-22 mins $Therapeutic Exercise: 8-22 mins                    G Codes:       Elwyn Reach, PT [phone]    [place] 04/07/2017, 11:53 AM

## 2017-04-07 NOTE — Progress Notes (Signed)
PROGRESS NOTE    Denise Morton  ZTI:[id number] DOB: 07-11-1950 DOA: 03/07/2017 PCP: Seward Carol, MD  Brief Narrative:66 y.o.femaleWith pmhx of metastatic breast CA, DM, admitted on 03/07/17 with weakness, dizziness, anorexia, speech difficulties and near syncope. MRI brain demonstrated metastatic lesions to the cerebellum and right occipital areas. She was initially admitted to [facility].  Neurosurgery was consulted and patient was transferred to [facility] on 2/23. She underwent suboccipital craniectomy for resection of right cerebellar meton 2/25 by Dr. Saintclair Halsted.  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  Symptomatically she is feeling much better with improved strength, speech, appetite.  Returned to the OR on 3/6 for correction of her CSF leak underwent reexploration of the suboccipital incision for repair of primary CSF leak utilizing DuraMatrix patch with fascial patch grafts.  She has continued to have a slow CSF leak.  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  On 3/12 underwent reexploration of suboccipital craniectomy incision. Primary repair of CSF leak with sewing a myofascial patch graft and R frontal external ventricular drain.   Assessment & Plan:   1. Metastatic breast cancer with cerebellar metastasis status post suboccipital craniotomy and resection of right cerebellar mass -Continue Keppra and Decadron per Dr.Cram -Appreciate palliative care input, yesterday finally agreed to DO NOT RESUSCITATE, family understands that overall prognosis is very poor with widely metastatic breast cancer  2. CSF leak -Status post multiple vascular grafts and external ventricular drain -per Dr.Cram on prophylactic antibiotics, CT head stable-pneumocephalus  persists -monitor  4.  Type 2 diabetes -CBG stable, continue Lantus and sliding-scale insulin  5. Hypertension -Stable, continue Norvasc, labetalol, lisinopril  6. Chronic anemia/thrombocytopenia -secondary to chronic disease and acute blood loss -Monitor  DVT prophylaxis:with SCDs due to brain metastasis and multiple surgeries Code Status: DNR Family Communication:son Daniel at bedside Disposition Plan: keep in stepdown, SNF with pneumocephalus resolved   Procedures:  03/13/2017 suboccipital craniectomy for resection of right cerebellar met  03/22/2017:  Wound revision due to CSF leak 03/28/2017:   reexploration of suboccipital craniectomy incision. Primary repair of CSF leak with sewing a myofascial patch graft and R frontal external ventricular drain.  Antimicrobials:    Subjective: -weak, no other complaints  Objective: Vitals:   04/07/17 0400 04/07/17 0500 04/07/17 0729 04/07/17 1217  BP: 131/61  125/82 113/65  Pulse:   (!) 59   Resp: 14  13   Temp:   98.5 F (36.9 C) 97.7 F (36.5 C)  TempSrc:   Oral Oral  SpO2: 96%  97%   Weight:  97.6 kg (215 lb 2.7 oz)    Height:        Intake/Output Summary (Last 24 hours) at 04/07/2017 1247 Last data filed at 04/07/2017 8250 Gross per 24 hour  Intake 1102.5 ml  Output 2925 ml  Net -1822.5 ml   Filed Weights   04/01/17 0500 04/04/17 0500 04/07/17 0500  Weight: 103.5 kg (228 lb 2.8 oz) 103.5 kg (228 lb 2.8 oz) 97.6 kg (215 lb 2.7 oz)    Examination:  Gen: obese, chronically ill debilitated female  HEENT: cervical incision, healing well, no leak Lungs: Good air movement bilaterally, CTAB CVS: RRR,No Gallops,Rubs or new Murmurs Abd: soft, Non tender, non distended, BS present Extremities: strength 3+/5, bilaterally in both legs Skin: no new rashes Psychiatry: Judgement and insight  appear normal. Mood & affect appropriate.     Data Reviewed:   CBC: Recent Labs  Lab 04/03/17 0509 04/04/17 0535  04/05/17 0542 04/06/17 0620 04/07/17 0654  WBC 4.9 4.5 4.9 4.4 4.2  HGB 8.7* 8.8* 9.1* 9.4* 9.3*  HCT 28.1* 27.7* 29.6* 30.4* 28.8*  MCV 100.7* 101.8* 102.4* 104.5* 103.2*  PLT 101* 98* 101* 102* 226*   Basic Metabolic Panel: Recent Labs  Lab 04/03/17 0509 04/04/17 0535 04/05/17 0542 04/06/17 0620 04/07/17 0654  NA 137 138 140 137 139  K 4.5 4.1 3.6 3.9 3.5  CL 100* 99* 107 108 111  CO2 28 29 25  21* 20*  GLUCOSE 159* 162* 160* 149* 165*  BUN 28* 28* 24* 28* 29*  CREATININE 0.71 0.74 0.62 0.77 0.68  CALCIUM 8.8* 8.9 8.8* 8.3* 8.1*   GFR: Estimated Creatinine Clearance: 81.5 mL/min (by C-G formula based on SCr of 0.68 mg/dL). Liver Function Tests: No results for input(s): AST, ALT, ALKPHOS, BILITOT, PROT, ALBUMIN in the last 168 hours. No results for input(s): LIPASE, AMYLASE in the last 168 hours. No results for input(s): AMMONIA in the last 168 hours. Coagulation Profile: No results for input(s): INR, PROTIME in the last 168 hours. Cardiac Enzymes: No results for input(s): CKTOTAL, CKMB, CKMBINDEX, TROPONINI in the last 168 hours. BNP (last 3 results) No results for input(s): PROBNP in the last 8760 hours. HbA1C: No results for input(s): HGBA1C in the last 72 hours. CBG: Recent Labs  Lab 04/06/17 0751 04/06/17 1151 04/06/17 1745 04/06/17 2121 04/07/17 0833  GLUCAP 177* 115* 217* 150* 120*   Lipid Profile: No results for input(s): CHOL, HDL, LDLCALC, TRIG, CHOLHDL, LDLDIRECT in the last 72 hours. Thyroid Function Tests: No results for input(s): TSH, T4TOTAL, FREET4, T3FREE, THYROIDAB in the last 72 hours. Anemia Panel: No results for input(s): VITAMINB12, FOLATE, FERRITIN, TIBC, IRON, RETICCTPCT in the last 72 hours. Urine analysis: No results found for: COLORURINE, APPEARANCEUR, LABSPEC, PHURINE, GLUCOSEU, HGBUR, BILIRUBINUR, KETONESUR, PROTEINUR, UROBILINOGEN, NITRITE, LEUKOCYTESUR Sepsis Labs: @LABRCNTIP (procalcitonin:4,lacticidven:4)  ) Recent Results  (from the past 240 hour(s))  CSF culture with Stat gram stain     Status: None   Collection Time: 04/01/17 10:24 AM  Result Value Ref Range Status   Specimen Description CSF DRAWN FROM IVC  Final   Special Requests Normal  Final   Gram Stain   Final    FEW WBC PRESENT,BOTH PMN AND MONONUCLEAR NO ORGANISMS SEEN    Culture   Final    NO GROWTH 3 DAYS Performed at [facility], [address], [place], [place] [address]    Report Status 04/04/2017 FINAL  Final         Radiology Studies: Ct Head Wo Contrast  Result Date: 04/06/2017 CLINICAL DATA:  Follow-up examination status post intracranial hemorrhage, craniotomy with right cerebellar tumor resection. EXAM: CT HEAD WITHOUT CONTRAST TECHNIQUE: Contiguous axial images were obtained from the base of the skull through the vertex without intravenous contrast. COMPARISON:  Prior CT from 04/04/2017. FINDINGS: Brain: Postoperative changes from prior occipital craniectomy for right cerebellar tumor resection fluid collection at the craniectomy site overall little interval changed. Subtotal resection of the right cerebellar vas with residual calcification along the resection cavity is similar to previous. Similar edema with mild mass effect on the adjacent fourth ventricle without hydrocephalus. Extra-axial pneumocephalus overlying the frontal convexities is little interval changed. Otherwise stable appearance of the brain. No new intracranial hemorrhage. No new large vessel territory infarct. Vascular: No hyperdense vessel. Scattered  vascular calcifications noted within the carotid siphons. Skull: Occipital craniectomy with right frontal burr hole. No new scalp soft tissue abnormality. Sinuses/Orbits: Globes and orbital soft tissues demonstrate no acute finding. Paranasal sinuses and mastoid air cells remain clear. Other: None. IMPRESSION: 1. Stable exam with overall similar size of fluid collection at the craniectomy site. Extra-axial  pneumocephalus little interval change from previous. 2. Similar right cerebellar edema with mild mass effect on the adjacent fourth ventricle. No hydrocephalus. 3. No other new intracranial abnormality. Electronically Signed   By: Jeannine Boga M.D.   On: 04/06/2017 01:05        Scheduled Meds: . acetaZOLAMIDE  250 mg Oral QID  . amLODipine  10 mg Oral Daily  . Chlorhexidine Gluconate Cloth  6 each Topical Q0600  . dexamethasone  2 mg Oral Q12H  . docusate sodium  100 mg Oral BID  . doxycycline  100 mg Oral Q12H  . feeding supplement (GLUCERNA SHAKE)  237 mL Oral BID BM  . ferrous sulfate  325 mg Oral BID WC  . insulin aspart  0-20 Units Subcutaneous TID WC  . insulin aspart  0-5 Units Subcutaneous QHS  . insulin aspart  10 Units Subcutaneous TID WC  . insulin glargine  24 Units Subcutaneous QHS  . labetalol  200 mg Oral TID  . levETIRAcetam  500 mg Oral BID  . lisinopril  5 mg Oral Daily  . mirtazapine  15 mg Oral QHS  . nystatin-triamcinolone   Topical BID  . pantoprazole  40 mg Oral QHS  . polyethylene glycol  17 g Oral Daily  . senna  1 tablet Oral BID  . sodium chloride flush  10-40 mL Intracatheter Q12H   Continuous Infusions: . sodium chloride 10 mL/hr at 04/07/17 0800  . gentamicin Stopped (04/06/17 2256)  . vancomycin Stopped (04/07/17 0735)     LOS: 31 days    Time spent:be 57mn    PDomenic Polite MD [facility] Page via [email or web address], password [email or web address] After 7PM please contact night-coverage  04/07/2017, 12:46 PM

## 2017-04-07 NOTE — Progress Notes (Signed)
Patient ID: Denise Morton, female   DOB: 1950/10/09, 67 y.o.   MRN: [id number] Seems to be doing well. No headache. Awake and alert. Some double vision. Working with physical therapy. Incision is dry.

## 2017-04-08 LAB — BASIC METABOLIC PANEL
Anion gap: 9 (ref 5–15)
BUN: 33 mg/dL — AB (ref 6–20)
CALCIUM: 8.3 mg/dL — AB (ref 8.9–10.3)
CO2: 19 mmol/L — ABNORMAL LOW (ref 22–32)
Chloride: 109 mmol/L (ref 101–111)
Creatinine, Ser: 0.85 mg/dL (ref 0.44–1.00)
GFR calc Af Amer: >60 mL/min (ref >60)
Glucose, Bld: 129 mg/dL — ABNORMAL HIGH (ref 65–99)
POTASSIUM: 3.7 mmol/L (ref 3.5–5.1)
SODIUM: 137 mmol/L (ref 135–145)

## 2017-04-08 LAB — CBC
HEMATOCRIT: 29.7 % — AB (ref 36.0–46.0)
Hemoglobin: 9.1 g/dL — ABNORMAL LOW (ref 12.0–15.0)
MCH: 31.6 pg (ref 26.0–34.0)
MCHC: 30.6 g/dL (ref 30.0–36.0)
MCV: 103.1 fL — ABNORMAL HIGH (ref 78.0–100.0)
PLATELETS: 102 10*3/uL — AB (ref 150–400)
RBC: 2.88 MIL/uL — ABNORMAL LOW (ref 3.87–5.11)
RDW: 18.6 % — AB (ref 11.5–15.5)
WBC: 4.6 10*3/uL (ref 4.0–10.5)

## 2017-04-08 LAB — GLUCOSE, CAPILLARY
GLUCOSE-CAPILLARY: 140 mg/dL — AB (ref 65–99)
GLUCOSE-CAPILLARY: 173 mg/dL — AB (ref 65–99)
GLUCOSE-CAPILLARY: 138 mg/dL — AB (ref 65–99)
Glucose-Capillary: 130 mg/dL — ABNORMAL HIGH (ref 65–99)

## 2017-04-08 NOTE — Progress Notes (Signed)
Subjective: Patient reports resting comfortably.  Objective: Vital signs in last 24 hours: Temp:  [97.6 F (36.4 C)-98.8 F (37.1 C)] 98.3 F (36.8 C) (03/23 0331) Pulse Rate:  [58-78] 58 (03/23 0440) Resp:  [13-32] 20 (03/23 0440) BP: (100-130)/(56-91) 100/56 (03/23 0440) SpO2:  [96 %-100 %] 99 % (03/23 0440)  Intake/Output from previous day: 03/22 0701 - 03/23 0700 In: 1150 [P.O.:720; I.V.:130; IV Piggyback:300] Out: 500 [Urine:500] Intake/Output this shift: No intake/output data recorded.  Physical Exam: Dressing CDI.    Lab Results: Recent Labs    04/07/17 0654 04/08/17 0500  WBC 4.2 4.6  HGB 9.3* 9.1*  HCT 28.8* 29.7*  PLT 105* 102*   BMET Recent Labs    04/07/17 0654 04/08/17 0500  NA 139 137  K 3.5 3.7  CL 111 109  CO2 20* 19*  GLUCOSE 165* 129*  BUN 29* 33*  CREATININE 0.68 0.85  CALCIUM 8.1* 8.3*    Studies/Results: No results found.  Assessment/Plan: Patient is resting.  No complaints or drainage per her nurse.  Awaiting placement.    LOS: 32 days    Ezri Landers D, MD 04/08/2017, 7:19 AM

## 2017-04-08 NOTE — Progress Notes (Signed)
Patient seen and examined, clinically stable and improving, awaiting resolution of pneumocephalus -Out of bed, physical therapy as tolerated today -Blood sugars are stable -Likely repeat CT brain on Monday per NSG  Domenic Polite, MD

## 2017-04-09 LAB — BASIC METABOLIC PANEL
Anion gap: 8 (ref 5–15)
BUN: 38 mg/dL — AB (ref 6–20)
CALCIUM: 8.9 mg/dL (ref 8.9–10.3)
CO2: 20 mmol/L — ABNORMAL LOW (ref 22–32)
CREATININE: 0.94 mg/dL (ref 0.44–1.00)
Chloride: 112 mmol/L — ABNORMAL HIGH (ref 101–111)
GFR calc Af Amer: >60 mL/min (ref >60)
GLUCOSE: 162 mg/dL — AB (ref 65–99)
POTASSIUM: 3.8 mmol/L (ref 3.5–5.1)
SODIUM: 140 mmol/L (ref 135–145)

## 2017-04-09 LAB — CBC
HCT: 30.2 % — ABNORMAL LOW (ref 36.0–46.0)
Hemoglobin: 9.4 g/dL — ABNORMAL LOW (ref 12.0–15.0)
MCH: 32 pg (ref 26.0–34.0)
MCHC: 31.1 g/dL (ref 30.0–36.0)
MCV: 102.7 fL — ABNORMAL HIGH (ref 78.0–100.0)
PLATELETS: 108 10*3/uL — AB (ref 150–400)
RBC: 2.94 MIL/uL — ABNORMAL LOW (ref 3.87–5.11)
RDW: 18.5 % — AB (ref 11.5–15.5)
WBC: 5.2 10*3/uL (ref 4.0–10.5)

## 2017-04-09 LAB — GLUCOSE, CAPILLARY
GLUCOSE-CAPILLARY: 206 mg/dL — AB (ref 65–99)
Glucose-Capillary: 148 mg/dL — ABNORMAL HIGH (ref 65–99)
Glucose-Capillary: 75 mg/dL (ref 65–99)
Glucose-Capillary: 162 mg/dL — ABNORMAL HIGH (ref 65–99)

## 2017-04-09 NOTE — Progress Notes (Signed)
Pharmacy Antibiotic Note  Denise Morton is a 67 y.o. female admitted on 03/07/2017 with surgical prophylaxis.   PT is s/p ventriculostomy. Continues on vancomycin, doxycycline, and gentamicin until CSF leak stops per Dr Saintclair Halsted. Noted CT likely planned for tomorrow, will follow-up with plan to discontinue prophylaxis after scan.   Patient remains afebrile, WBC wnl, and small increase in Scr from 0.68 to 0.94. If continues on vancomycin and gentamycin after tomorrow will check level again.   3/20 Vanc tr - 16 3/20 Gent tr - 0.9  Plan: Continue vancomycin 750 mg IV q12h Continue gentamicin 500 mg IV q36h Continue doxycycline 100mg  PO q12h Monitor renal fx and plan to discontinue prophylaxis   Height: 5\' 6"  (167.6 cm) Weight: 215 lb 2.7 oz (97.6 kg) IBW/kg (Calculated) : 59.3  Temp (24hrs), Avg:98 F (36.7 C), Min:97.4 F (36.3 C), Max:98.6 F (37 C)  Recent Labs  Lab 04/05/17 0542 04/06/17 0620 04/07/17 0654 04/08/17 0500 04/09/17 0500  WBC 4.9 4.4 4.2 4.6 5.2  CREATININE 0.62 0.77 0.68 0.85 0.94  VANCOTROUGH 16  --   --   --   --   GENTTROUGH 0.9  --   --   --   --     Estimated Creatinine Clearance: 69.3 mL/min (by C-G formula based on SCr of 0.94 mg/dL).    Allergies  Allergen Reactions  . Penicillin G Rash    Has patient had a PCN reaction causing immediate rash, facial/tongue/throat swelling, SOB or lightheadedness with hypotension: Unknown Has patient had a PCN reaction causing severe rash involving mucus membranes or skin necrosis: Unknown Has patient had a PCN reaction that required hospitalization: Unknown Has patient had a PCN reaction occurring within the last 10 years: Unknown If all of the above answers are "NO", then may proceed with Cephalosporin use.   . Sulfa Antibiotics Rash   Jalene Mullet, Pharm.D. PGY1 Pharmacy Resident 04/09/2017 9:14 AM Main Pharmacy: [phone]

## 2017-04-09 NOTE — Progress Notes (Signed)
Patient ID: Denise Morton, female   DOB: [birth year], 67 y.o.   MRN: [id number] BP 129/74   Pulse 77   Temp 97.8 F (36.6 C) (Oral)   Resp 19   Ht 5\' 6"  (1.676 m)   Wt 97.6 kg (215 lb 2.7 oz)   SpO2 98%   BMI 34.73 kg/m  Alert and oriented x 4, speech is clear, fluent Wound is dry today Diplopia right sided gaze and midline  Not on left sided gaze Mentation is clear CT to be repeated sometime this week. kc

## 2017-04-09 NOTE — Progress Notes (Signed)
PROGRESS NOTE    Denise Morton  YTK:[id number] DOB: November 02, 1950 DOA: 03/07/2017 PCP: Seward Carol, MD  Brief Narrative:66 y.o.femaleWith pmhx of metastatic breast CA, DM, admitted on 03/07/17 with weakness, dizziness, anorexia, speech difficulties and near syncope. MRI brain demonstrated metastatic lesions to the cerebellum and right occipital areas. She was initially admitted to [facility].  Neurosurgery was consulted and patient was transferred to [facility] on 2/23. She underwent suboccipital craniectomy for resection of right cerebellar meton 2/25 by Dr. Saintclair Halsted.  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  Symptomatically she is feeling much better with improved strength, speech, appetite.  Returned to the OR on 3/6 for correction of her CSF leak underwent reexploration of the suboccipital incision for repair of primary CSF leak utilizing DuraMatrix patch with fascial patch grafts.  She has continued to have a slow CSF leak.  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  On 3/12 underwent reexploration of suboccipital craniectomy incision. Primary repair of CSF leak with sewing a myofascial patch graft and R frontal external ventricular drain.   Assessment & Plan:   1. Metastatic breast cancer with cerebellar metastasis status post suboccipital craniotomy and resection of right cerebellar mass -Continue Keppra and Decadron per Dr.Cram -Appreciate palliative care input, finally agreed to DO NOT RESUSCITATE, family understands that overall prognosis is very poor with widely metastatic breast cancer  2. CSF leak -Status post multiple vascular grafts and external ventricular drain -per Dr.Cram on prophylactic antibiotics, CT head stable-pneumocephalus  persists -monitor -plan for repeat CT this week  4.  Type 2 diabetes -CBG stable, continue Lantus and sliding-scale insulin  5. Hypertension -Stable, continue Norvasc, labetalol, lisinopril  6. Chronic anemia/thrombocytopenia -secondary to chronic disease and acute blood loss -Monitor  DVT prophylaxis:with SCDs due to brain metastasis and multiple surgeries Code Status: DNR Family Communication:son Daniel at bedside 2 days ago Disposition Plan: remains inpatient per neurosurgery, SNF with pneumocephalus resolved   Procedures:  03/13/2017 suboccipital craniectomy for resection of right cerebellar met  03/22/2017:  Wound revision due to CSF leak 03/28/2017:   reexploration of suboccipital craniectomy incision. Primary repair of CSF leak with sewing a myofascial patch graft and R frontal external ventricular drain.  Antimicrobials:    Subjective: -feels okay, in good spirits, no headache  Objective: Vitals:   04/09/17 0400 04/09/17 0800 04/09/17 0859 04/09/17 1121  BP: (!) 120/56 129/74  (!) 142/79  Pulse: 68  77   Resp: 14 19    Temp:  97.8 F (36.6 C)    TempSrc:  Oral    SpO2: 96% 98%    Weight:      Height:        Intake/Output Summary (Last 24 hours) at 04/09/2017 1229 Last data filed at 04/09/2017 0900 Gross per 24 hour  Intake 1230 ml  Output 1750 ml  Net -520 ml   Filed Weights   04/01/17 0500 04/04/17 0500 04/07/17 0500  Weight: 103.5 kg (228 lb 2.8 oz) 103.5 kg (228 lb 2.8 oz) 97.6 kg (215 lb 2.7 oz)    Examination:  Gen: Awake, Alert, chronically ill, debilitated female HEENT: cervical incision healing well, no leak Lungs: Good air movement bilaterally, CTAB CVS: RRR,No Gallops,Rubs or new Murmurs Abd: soft, Non tender, non distended, BS present Extremities: strength 3+/5, bilaterally in both legs Skin: no new  rashes Psychiatry: Judgement and insight appear normal. Mood & affect appropriate.     Data Reviewed:   CBC: Recent Labs  Lab  04/05/17 0542 04/06/17 0620 04/07/17 0654 04/08/17 0500 04/09/17 0500  WBC 4.9 4.4 4.2 4.6 5.2  HGB 9.1* 9.4* 9.3* 9.1* 9.4*  HCT 29.6* 30.4* 28.8* 29.7* 30.2*  MCV 102.4* 104.5* 103.2* 103.1* 102.7*  PLT 101* 102* 105* 102* 025*   Basic Metabolic Panel: Recent Labs  Lab 04/05/17 0542 04/06/17 0620 04/07/17 0654 04/08/17 0500 04/09/17 0500  NA 140 137 139 137 140  K 3.6 3.9 3.5 3.7 3.8  CL 107 108 111 109 112*  CO2 25 21* 20* 19* 20*  GLUCOSE 160* 149* 165* 129* 162*  BUN 24* 28* 29* 33* 38*  CREATININE 0.62 0.77 0.68 0.85 0.94  CALCIUM 8.8* 8.3* 8.1* 8.3* 8.9   GFR: Estimated Creatinine Clearance: 69.3 mL/min (by C-G formula based on SCr of 0.94 mg/dL). Liver Function Tests: No results for input(s): AST, ALT, ALKPHOS, BILITOT, PROT, ALBUMIN in the last 168 hours. No results for input(s): LIPASE, AMYLASE in the last 168 hours. No results for input(s): AMMONIA in the last 168 hours. Coagulation Profile: No results for input(s): INR, PROTIME in the last 168 hours. Cardiac Enzymes: No results for input(s): CKTOTAL, CKMB, CKMBINDEX, TROPONINI in the last 168 hours. BNP (last 3 results) No results for input(s): PROBNP in the last 8760 hours. HbA1C: No results for input(s): HGBA1C in the last 72 hours. CBG: Recent Labs  Lab 04/08/17 1212 04/08/17 1721 04/08/17 2149 04/09/17 0807 04/09/17 1134  GLUCAP 173* 138* 130* 148* 75   Lipid Profile: No results for input(s): CHOL, HDL, LDLCALC, TRIG, CHOLHDL, LDLDIRECT in the last 72 hours. Thyroid Function Tests: No results for input(s): TSH, T4TOTAL, FREET4, T3FREE, THYROIDAB in the last 72 hours. Anemia Panel: No results for input(s): VITAMINB12, FOLATE, FERRITIN, TIBC, IRON, RETICCTPCT in the last 72 hours. Urine analysis: No results found for: COLORURINE, APPEARANCEUR, LABSPEC, PHURINE, GLUCOSEU, HGBUR, BILIRUBINUR, KETONESUR, PROTEINUR, UROBILINOGEN, NITRITE, LEUKOCYTESUR Sepsis  Labs: @LABRCNTIP (procalcitonin:4,lacticidven:4)  ) Recent Results (from the past 240 hour(s))  CSF culture with Stat gram stain     Status: None   Collection Time: 04/01/17 10:24 AM  Result Value Ref Range Status   Specimen Description CSF DRAWN FROM IVC  Final   Special Requests Normal  Final   Gram Stain   Final    FEW WBC PRESENT,BOTH PMN AND MONONUCLEAR NO ORGANISMS SEEN    Culture   Final    NO GROWTH 3 DAYS Performed at [facility], [address] [place], [place] [address]    Report Status 04/04/2017 FINAL  Final  MRSA PCR Screening     Status: None   Collection Time: 04/07/17  9:06 AM  Result Value Ref Range Status   MRSA by PCR NEGATIVE NEGATIVE Final    Comment:        The GeneXpert MRSA Assay (FDA approved for NASAL specimens only), is one component of a comprehensive MRSA colonization surveillance program. It is not intended to diagnose MRSA infection nor to guide or monitor treatment for MRSA infections. Performed at [facility], [place] [address] [place], [place] [address]          Radiology Studies: No results found.      Scheduled Meds: . acetaZOLAMIDE  250 mg Oral QID  . amLODipine  10 mg Oral Daily  . Chlorhexidine Gluconate Cloth  6 each Topical Q0600  . dexamethasone  2 mg Oral Q12H  . docusate sodium  100 mg Oral BID  . doxycycline  100 mg Oral Q12H  . feeding supplement (GLUCERNA SHAKE)  237 mL Oral BID BM  . ferrous sulfate  325 mg Oral BID WC  . insulin aspart  0-20 Units Subcutaneous TID WC  . insulin aspart  0-5 Units Subcutaneous QHS  . insulin aspart  10 Units Subcutaneous TID WC  . insulin glargine  24 Units Subcutaneous QHS  . labetalol  200 mg Oral TID  . levETIRAcetam  500 mg Oral BID  . lisinopril  5 mg Oral Daily  . mirtazapine  15 mg Oral QHS  . nystatin-triamcinolone   Topical BID  . pantoprazole  40 mg Oral QHS  . polyethylene glycol  17 g Oral Daily  . senna  1 tablet Oral BID  . sodium chloride  flush  10-40 mL Intracatheter Q12H   Continuous Infusions: . sodium chloride 10 mL/hr at 04/09/17 0800  . gentamicin Stopped (04/08/17 1117)  . vancomycin Stopped (04/09/17 0729)     LOS: 33 days    Time spent:be 50mn    PDomenic Polite MD [facility] Page via [email or web address], password [email or web address] After 7PM please contact night-coverage  04/09/2017, 12:29 PM

## 2017-04-10 LAB — BASIC METABOLIC PANEL
Anion gap: 9 (ref 5–15)
BUN: 40 mg/dL — AB (ref 6–20)
CALCIUM: 8.5 mg/dL — AB (ref 8.9–10.3)
CO2: 18 mmol/L — ABNORMAL LOW (ref 22–32)
CREATININE: 0.8 mg/dL (ref 0.44–1.00)
Chloride: 112 mmol/L — ABNORMAL HIGH (ref 101–111)
GFR calc Af Amer: >60 mL/min (ref >60)
GLUCOSE: 155 mg/dL — AB (ref 65–99)
POTASSIUM: 3.6 mmol/L (ref 3.5–5.1)
SODIUM: 139 mmol/L (ref 135–145)

## 2017-04-10 LAB — CBC
HEMATOCRIT: 29.4 % — AB (ref 36.0–46.0)
Hemoglobin: 8.9 g/dL — ABNORMAL LOW (ref 12.0–15.0)
MCH: 31 pg (ref 26.0–34.0)
MCHC: 30.3 g/dL (ref 30.0–36.0)
MCV: 102.4 fL — ABNORMAL HIGH (ref 78.0–100.0)
PLATELETS: 115 10*3/uL — AB (ref 150–400)
RBC: 2.87 MIL/uL — ABNORMAL LOW (ref 3.87–5.11)
RDW: 18.4 % — AB (ref 11.5–15.5)
WBC: 6.5 10*3/uL (ref 4.0–10.5)

## 2017-04-10 LAB — GLUCOSE, CAPILLARY
GLUCOSE-CAPILLARY: 100 mg/dL — AB (ref 65–99)
Glucose-Capillary: 170 mg/dL — ABNORMAL HIGH (ref 65–99)
Glucose-Capillary: 163 mg/dL — ABNORMAL HIGH (ref 65–99)
Glucose-Capillary: 111 mg/dL — ABNORMAL HIGH (ref 65–99)

## 2017-04-10 NOTE — Progress Notes (Signed)
Occupational Therapy Treatment Patient Details Name: Denise Morton MRN: [id number] DOB: Jun 01, 1950 Today's Date: 04/10/2017    History of present illness  Denise Morton is a 67 y.o. 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  PMHx of Breast CA, DM, central retinal vein occlusion right eye   OT comments  Pt fatigued this pm.  She moved to EOB and stood x 12 mins with min A while bathing as she was incontinent while in bed.   DOE 3/4, and pt fatigued requesting to return to supine.  She demonstrates slow processing and decreased initiation today   Follow Up Recommendations  SNF;Supervision/Assistance - 24 hour    Equipment Recommendations       Recommendations for Other Services      Precautions / Restrictions Precautions Precautions: Fall       Mobility Bed Mobility Overal bed mobility: Needs Assistance       Supine to sit: Min assist Sit to supine: Mod assist   General bed mobility comments: assist to lift trunk off bed and assist to lift LEs onto bed   Transfers Overall transfer level: Needs assistance Equipment used: Rolling walker (2 wheeled) Transfers: Sit to/from Stand Sit to Stand: Min assist;From elevated surface         General transfer comment: assist to power up into standing     Balance Overall balance assessment: Needs assistance   Sitting balance-Leahy Scale: Fair     Standing balance support: Bilateral upper extremity supported Standing balance-Leahy Scale: Poor Standing balance comment: requires bil.  UE support and min A                            ADL either performed or assessed with clinical judgement   ADL Overall ADL's : Needs assistance/impaired                         Toilet Transfer: Moderate assistance;+2 for safety/equipment;BSC;Stand-pivot   Toileting- Clothing Manipulation and Hygiene: Total assistance;Sit to/from stand Toileting - Clothing Manipulation Details (indicate cue type and reason): Pt incontinent of urine.  Assisted with peri care              Vision   Additional Comments: Pt unable to articulate visual deficits - just says vision is bad.  She endorses glare with lights on and light sensitivity.   She also indicates blurring    Perception     Praxis      Cognition Arousal/Alertness: Awake/alert Behavior During Therapy: WFL for tasks assessed/performed Overall Cognitive Status: Impaired/Different from baseline Area of Impairment: Attention;Memory;Following commands;Problem solving                   Current Attention Level: Selective Memory: Decreased short-term memory Following Commands: Follows one step commands consistently;Follows multi-step commands inconsistently Safety/Judgement: Decreased awareness of deficits   Problem Solving: Slow processing;Difficulty sequencing;Requires verbal cues;Requires tactile cues          Exercises     Shoulder Instructions       General Comments Pt stood x 12 mins for peri care and clothing change.  She demonstrated DOE 3/4     Pertinent Vitals/ Pain  Pain Assessment: No/denies pain  Home Living                                          Prior Functioning/Environment              Frequency  Min 3X/week        Progress Toward Goals  OT Goals(current goals can now be found in the care plan section)  Progress towards OT goals: Not progressing toward goals - comment(fatigue)     Plan Discharge plan remains appropriate    Co-evaluation                 AM-PAC PT "6 Clicks" Daily Activity     Outcome Measure   Help from another person eating meals?: None Help from another person taking care of personal  grooming?: A Little Help from another person toileting, which includes using toliet, bedpan, or urinal?: A Lot Help from another person bathing (including washing, rinsing, drying)?: A Lot Help from another person to put on and taking off regular upper body clothing?: A Lot Help from another person to put on and taking off regular lower body clothing?: Total 6 Click Score: 14    End of Session Equipment Utilized During Treatment: Rolling walker  OT Visit Diagnosis: Unsteadiness on feet (R26.81);Other symptoms and signs involving cognitive function   Activity Tolerance Patient limited by fatigue   Patient Left in bed;with call bell/phone within reach;with nursing/sitter in room   Nurse Communication Mobility status        Time: 8127-5170 OT Time Calculation (min): 25 min  Charges: OT General Charges $OT Visit: 1 Visit OT Treatments $Self Care/Home Management : 8-22 mins $Therapeutic Activity: 8-22 mins  [facility], OTR/L [phone]    Lucille Passy M 04/10/2017, 7:25 PM

## 2017-04-10 NOTE — Plan of Care (Signed)
  Problem: Education: Goal: Knowledge of General Education information will improve Outcome: Progressing   Problem: Health Behavior/Discharge Planning: Goal: Ability to manage health-related needs will improve Outcome: Progressing   Problem: Clinical Measurements: Goal: Ability to maintain clinical measurements within normal limits will improve Outcome: Progressing Goal: Will remain free from infection Outcome: Progressing Goal: Diagnostic test results will improve Outcome: Progressing Goal: Respiratory complications will improve Outcome: Progressing Goal: Cardiovascular complication will be avoided Outcome: Progressing   Problem: Activity: Goal: Risk for activity intolerance will decrease Outcome: Progressing   Problem: Nutrition: Goal: Adequate nutrition will be maintained Outcome: Progressing   Problem: Coping: Goal: Level of anxiety will decrease Outcome: Progressing   Problem: Elimination: Goal: Will not experience complications related to bowel motility Outcome: Progressing Goal: Will not experience complications related to urinary retention Outcome: Progressing   Problem: Pain Managment: Goal: General experience of comfort will improve Outcome: Progressing   Problem: Safety: Goal: Ability to remain free from injury will improve Outcome: Progressing   Problem: Skin Integrity: Goal: Risk for impaired skin integrity will decrease Outcome: Progressing   Problem: Education: Goal: Knowledge of the prescribed therapeutic regimen will improve Outcome: Progressing   Problem: Clinical Measurements: Goal: Usual level of consciousness will be regained or maintained. Outcome: Progressing Goal: Neurologic status will improve Outcome: Progressing Goal: Ability to maintain intracranial pressure will improve Outcome: Progressing   Problem: Skin Integrity: Goal: Demonstration of wound healing without infection will improve Outcome: Progressing

## 2017-04-10 NOTE — Progress Notes (Signed)
Nutrition Follow-up  DOCUMENTATION CODES:   Obesity unspecified  INTERVENTION:  Continue Glucerna Shake po BID, each supplement provides 220 kcal and 10 grams of protein.  Encourage adequate PO intake.   NUTRITION DIAGNOSIS:   Increased nutrient needs related to chronic illness, cancer and cancer related treatments as evidenced by estimated needs; ongoing  GOAL:   Patient will meet greater than or equal to 90% of their needs; met  MONITOR:   PO intake, Labs, Supplement acceptance, Weight trends, I & O's  REASON FOR ASSESSMENT:   Malnutrition Screening Tool    ASSESSMENT:   Pt with PMH of metastatic breast cancer, gastritis, and DM presents with weakness s/p syncope Pt underwent suboccipital craniectomy for resection of right cerebellar meton 2/25. 3/6 for correction of her CSF leak underwent reexploration of the suboccipital incision for repair of primary CSF leak. Stereotactic ventriculostomy placement and primary reexploration of her incision and reclosure on 3/12.  Meal completion has been 100%. Pt currently has Glucerna Shake ordered and has been consuming them. Intake has been adequate. RD to continue with current supplement orders to aid in adequate caloric and protein needs. Labs and medications reviewed.   Diet Order:  Diet - low sodium heart healthy Diet Carb Modified Fluid consistency: Thin; Room service appropriate? Yes  EDUCATION NEEDS:   Education needs have been addressed  Skin:  Skin Assessment: Skin Integrity Issues: Skin Integrity Issues:: Stage I Stage I: coccyx  Last BM:  3/25  Height:   Ht Readings from Last 1 Encounters:  03/22/17 5' 6"  (1.676 m)    Weight:   Wt Readings from Last 1 Encounters:  04/07/17 215 lb 2.7 oz (97.6 kg)    Ideal Body Weight:  61.4 kg  BMI:  Body mass index is 34.73 kg/m.  Estimated Nutritional Needs:   Kcal:  2020-2263  Protein:  110-120 grams  Fluid:  >/= 2 L/d    Denise Parker, MS, RD,  LDN Pager # [phone] After hours/ weekend pager # [phone]

## 2017-04-10 NOTE — Progress Notes (Signed)
Overall doing well.  Patient with mild headache.  No wound drainage.  Continue efforts at mobilization.  No change in plan.

## 2017-04-10 NOTE — Progress Notes (Signed)
Physical Therapy Treatment Patient Details Name: Denise Morton MRN: [id number] DOB: 1950/07/14 Today's Date: 04/10/2017    History of Present Illness  Denise Morton is a 67 y.o. 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  PMHx of Breast CA, DM, central retinal vein occlusion right eye    PT Comments    Pt pleasant and reports feeling increased dizziness with mobility today although VSS and no noted nystagmus of right eye today. Pt with continued deconditioning and difficulty progressing gait and mobility. Pt encouraged to be OOB daily with staff and perform HEP. Will follow.     Follow Up Recommendations  Supervision/Assistance - 24 hour;SNF     Equipment Recommendations       Recommendations for Other Services       Precautions / Restrictions Precautions Precautions: Fall Restrictions Weight Bearing Restrictions: No    Mobility  Bed Mobility Overal bed mobility: Needs Assistance Bed Mobility: Rolling;Sidelying to Sit Rolling: Min guard Sidelying to sit: Min assist       General bed mobility comments: use of rail with increased time, pt able to roll with rail but required assist to fully elevate trunk from surface today  Transfers Overall transfer level: Needs assistance   Transfers: Sit to/from Stand Sit to Stand: Min assist;From elevated surface         General transfer comment: min assist to stand from elevated bed and chair with blankets and pillows for elevation. cues for hand placement and sequence  Ambulation/Gait Ambulation/Gait assistance: Min assist;+2 safety/equipment Ambulation Distance (Feet): 26 Feet Assistive device: Rolling walker (2 wheeled) Gait Pattern/deviations: Decreased  stride length;Wide base of support;Trunk flexed;Step-through pattern   Gait velocity interpretation: Below normal speed for age/gender General Gait Details: cues for posture, looking up and safety. Pt able to maintain self with RW today and chair to follow for fatigue. Pt walked 22' then 26' limited by fatigue. Cues for breathing technique throughout   Stairs            Wheelchair Mobility    Modified Rankin (Stroke Patients Only)       Balance Overall balance assessment: Needs assistance   Sitting balance-Leahy Scale: Fair       Standing balance-Leahy Scale: Poor                              Cognition Arousal/Alertness: Awake/alert Behavior During Therapy: WFL for tasks assessed/performed Overall Cognitive Status: Impaired/Different from baseline Area of Impairment: Following commands;Memory;Safety/judgement;Awareness                 Orientation Level: Time Current Attention Level: Selective Memory: Decreased short-term memory Following Commands: Follows one step commands consistently Safety/Judgement: Decreased awareness of safety;Decreased awareness of deficits   Problem Solving: Slow processing;Requires verbal cues General Comments: pt able to state 3 animals with a C today, could not perform with D. Pt with slow processing and unable to recall need for glasses to assist with vision      Exercises General Exercises - Lower Extremity Long Arc Quad: AROM;Seated;Both;15 reps Hip Flexion/Marching: AROM;Both;Seated;15 reps    General Comments        Pertinent Vitals/Pain Pain Assessment: No/denies pain    Home  Living                      Prior Function            PT Goals (current goals can now be found in the care plan section) Acute Rehab PT Goals Time For Goal Achievement: 04/24/17 Potential to Achieve Goals: Fair Progress towards PT goals: Goals downgraded-see care plan    Frequency           PT Plan Current  plan remains appropriate    Co-evaluation              AM-PAC PT "6 Clicks" Daily Activity  Outcome Measure  Difficulty turning over in bed (including adjusting bedclothes, sheets and blankets)?: A Little Difficulty moving from lying on back to sitting on the side of the bed? : Unable Difficulty sitting down on and standing up from a chair with arms (e.g., wheelchair, bedside commode, etc,.)?: Unable Help needed moving to and from a bed to chair (including a wheelchair)?: A Little Help needed walking in hospital room?: A Little Help needed climbing 3-5 steps with a railing? : Total 6 Click Score: 12    End of Session Equipment Utilized During Treatment: Gait belt Activity Tolerance: Patient tolerated treatment well Patient left: in chair;with call bell/phone within reach;with chair alarm set Nurse Communication: Mobility status;Precautions PT Visit Diagnosis: Other symptoms and signs involving the nervous system (R29.898);Muscle weakness (generalized) (M62.81);Other abnormalities of gait and mobility (R26.89)     Time: 6468-0321 PT Time Calculation (min) (ACUTE ONLY): 21 min  Charges:  $Gait Training: 8-22 mins                    G Codes:       Elwyn Reach, PT [phone]    [place] 04/10/2017, 10:09 AM

## 2017-04-11 ENCOUNTER — Inpatient Hospital Stay: Admit: 2017-04-11 | Payer: PPO

## 2017-04-11 ENCOUNTER — Inpatient Hospital Stay
Admit: 2017-04-11 | Discharge: 2017-04-11 | Disposition: A | Discharge status: Home / Self Care | Payer: PPO | Attending: Radiation Oncology | Admitting: Radiation Oncology

## 2017-04-11 LAB — GLUCOSE, CAPILLARY
Glucose-Capillary: 165 mg/dL — ABNORMAL HIGH (ref 65–99)
Glucose-Capillary: 130 mg/dL — ABNORMAL HIGH (ref 65–99)
Glucose-Capillary: 197 mg/dL — ABNORMAL HIGH (ref 65–99)

## 2017-04-11 LAB — BASIC METABOLIC PANEL
Anion gap: 7 (ref 5–15)
BUN: 38 mg/dL — AB (ref 6–20)
CO2: 17 mmol/L — AB (ref 22–32)
CREATININE: 0.87 mg/dL (ref 0.44–1.00)
Calcium: 8.7 mg/dL — ABNORMAL LOW (ref 8.9–10.3)
Chloride: 117 mmol/L — ABNORMAL HIGH (ref 101–111)
GFR calc Af Amer: >60 mL/min (ref >60)
GFR calc non Af Amer: >60 mL/min (ref >60)
GLUCOSE: 168 mg/dL — AB (ref 65–99)
Potassium: 3.8 mmol/L (ref 3.5–5.1)
SODIUM: 141 mmol/L (ref 135–145)

## 2017-04-11 LAB — CBC
HCT: 29.1 % — ABNORMAL LOW (ref 36.0–46.0)
Hemoglobin: 9 g/dL — ABNORMAL LOW (ref 12.0–15.0)
MCH: 31.8 pg (ref 26.0–34.0)
MCHC: 30.9 g/dL (ref 30.0–36.0)
MCV: 102.8 fL — AB (ref 78.0–100.0)
PLATELETS: 120 10*3/uL — AB (ref 150–400)
RBC: 2.83 MIL/uL — ABNORMAL LOW (ref 3.87–5.11)
RDW: 18.5 % — AB (ref 11.5–15.5)
WBC: 5.6 10*3/uL (ref 4.0–10.5)

## 2017-04-11 NOTE — Progress Notes (Signed)
Physical Therapy Treatment Patient Details Name: Denise Morton MRN: [id number] DOB: 03-01-1950 Today's Date: 04/11/2017    History of Present Illness  Cici Klinker is a 67 y.o. 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  PMHx of Breast CA, DM, central retinal vein occlusion right eye    PT Comments    Pt pleasant and reports feeling tired and having not slept well. Pt with increased ability with bed mobility, transfers and gait today and encouraged continued mobility with nursing staff and to notify staff when voiding if aware for linen change and skin care. Will continue to follow.     Follow Up Recommendations  Supervision/Assistance - 24 hour;SNF     Equipment Recommendations  Rolling walker with 5" wheels    Recommendations for Other Services       Precautions / Restrictions Precautions Precautions: Fall    Mobility  Bed Mobility Overal bed mobility: Needs Assistance Bed Mobility: Rolling;Sidelying to Sit Rolling: Min guard Sidelying to sit: Min guard       General bed mobility comments: increased time with use of rail and cues for sequence and safety with pt able to to complete with HOB 25degrees and cues  Transfers Overall transfer level: Needs assistance   Transfers: Sit to/from Stand Sit to Stand: Min assist;From elevated surface         General transfer comment: min assist to power up into standing from bed and elevated chair with cues for hand placement  Ambulation/Gait Ambulation/Gait assistance: Min assist;+2 safety/equipment Ambulation Distance (Feet): 50 Feet Assistive device: Rolling walker (2 wheeled) Gait Pattern/deviations: Decreased stride length;Wide base of support;Trunk  flexed;Step-through pattern   Gait velocity interpretation: Below normal speed for age/gender General Gait Details: cues for posture, looking up and safety. Pt able to maintain self with RW today and chair to follow for fatigue. Pt walked 50' then 26' limited by fatigue. Cues for breathing technique throughout as well as looking up   Stairs            Wheelchair Mobility    Modified Rankin (Stroke Patients Only)       Balance Overall balance assessment: Needs assistance   Sitting balance-Leahy Scale: Fair       Standing balance-Leahy Scale: Poor                              Cognition Arousal/Alertness: Awake/alert Behavior During Therapy: WFL for tasks assessed/performed Overall Cognitive Status: Impaired/Different from baseline Area of Impairment: Attention;Memory;Following commands;Problem solving                   Current Attention Level: Selective Memory: Decreased short-term memory Following Commands: Follows one step commands consistently;Follows multi-step commands inconsistently Safety/Judgement: Decreased awareness of deficits   Problem Solving: Slow processing;Difficulty sequencing;Requires verbal cues;Requires tactile cues General Comments: pt unable to state 3 animals with D today, incontinent in bed on arrival with purewick not working and unaware, taking glasses off in sitting to "see better" but when asked if it worked she stated "no" then replaced them      Exercises General Exercises - Lower Extremity Long Arc Quad: AROM;Seated;Both;20 reps Hip Flexion/Marching: AROM;Both;Seated;20 reps    General Comments  Pertinent Vitals/Pain Pain Assessment: No/denies pain    Home Living                      Prior Function            PT Goals (current goals can now be found in the care plan section) Progress towards PT goals: Progressing toward goals    Frequency           PT Plan Current plan remains  appropriate    Co-evaluation              AM-PAC PT "6 Clicks" Daily Activity  Outcome Measure  Difficulty turning over in bed (including adjusting bedclothes, sheets and blankets)?: A Lot Difficulty moving from lying on back to sitting on the side of the bed? : Unable Difficulty sitting down on and standing up from a chair with arms (e.g., wheelchair, bedside commode, etc,.)?: Unable Help needed moving to and from a bed to chair (including a wheelchair)?: A Little Help needed walking in hospital room?: A Little Help needed climbing 3-5 steps with a railing? : Total 6 Click Score: 11    End of Session Equipment Utilized During Treatment: Gait belt Activity Tolerance: Patient tolerated treatment well Patient left: in chair;with call bell/phone within reach;with chair alarm set Nurse Communication: Mobility status;Precautions PT Visit Diagnosis: Other symptoms and signs involving the nervous system (R29.898);Muscle weakness (generalized) (M62.81);Other abnormalities of gait and mobility (R26.89)     Time: 6734-1937 PT Time Calculation (min) (ACUTE ONLY): 26 min  Charges:  $Gait Training: 8-22 mins $Therapeutic Exercise: 8-22 mins                    G Codes:       Elwyn Reach, PT [phone]    [place] 04/11/2017, 9:21 AM

## 2017-04-11 NOTE — Progress Notes (Signed)
PROGRESS NOTE    Denise Morton  WER:[id number] DOB: 24-Oct-1950 DOA: 03/07/2017 PCP: Seward Carol, MD  Brief Narrative:66 y.o.femaleWith pmhx of metastatic breast CA, DM, admitted on 03/07/17 with weakness, dizziness, anorexia, speech difficulties and near syncope. MRI brain demonstrated metastatic lesions to the cerebellum and right occipital areas. She was initially admitted to [facility].  Neurosurgery was consulted and patient was transferred to [facility] on 2/23. She underwent suboccipital craniectomy for resection of right cerebellar meton 2/25 by Dr. Saintclair Halsted.  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  Symptomatically she is feeling much better with improved strength, speech, appetite.  Returned to the OR on 3/6 for correction of her CSF leak underwent reexploration of the suboccipital incision for repair of primary CSF leak utilizing DuraMatrix patch with fascial patch grafts.  She has continued to have a slow CSF leak.  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  On 3/12 underwent reexploration of suboccipital craniectomy incision. Primary repair of CSF leak with sewing a myofascial patch graft and R frontal external ventricular drain.   Assessment & Plan:   1. Metastatic breast cancer with cerebellar metastasis status post suboccipital craniotomy and resection of right cerebellar mass -remain stable on Decadron and Keppra -Appreciate palliative care input, now DO NOT RESUSCITATE, family understands that overall prognosis is very poor with widely metastatic breast cancer -patient wishes to go to rehabilitation after discharge and then follow-up with Rad Onc  2. CSF leak/ pneumocephalus -Status post multiple vascular grafts and external ventricular drain -per  Dr.Cram on prophylactic antibiotics, CT head stable-pneumocephalus persists -clinically remained stable, further plans per neurosurgery  4.  Type 2 diabetes -CBG stable on current regimen of Lantus and sliding-scale  5. Hypertension -BP stable -Continue regimen of amlodipine, labetalol and lisinopril  6. Chronic anemia/thrombocytopenia -secondary to chronic disease and acute blood loss -stable, monitor  DVT prophylaxis:with SCDs due to brain metastasis and multiple surgeries Code Status: DNR Family Communication:son Daniel at bedside last week Disposition Plan: remains inpatient per neurosurgery, SNF with pneumocephalus resolved   Procedures:  03/13/2017 suboccipital craniectomy for resection of right cerebellar met  03/22/2017:  Wound revision due to CSF leak 03/28/2017:   reexploration of suboccipital craniectomy incision. Primary repair of CSF leak with sewing a myofascial patch graft and R frontal external ventricular drain.  Antimicrobials:    Subjective: -feels fair, no issues overnight, no headache at this time -Mobility slowly improving  Objective: Vitals:   04/11/17 0500 04/11/17 0800 04/11/17 0911 04/11/17 0916  BP:  135/73    Pulse:  71  (!) 120  Resp:  19    Temp:   98.4 F (36.9 C)   TempSrc:      SpO2:  97%    Weight: 99.4 kg (219 lb 2.2 oz)     Height:        Intake/Output Summary (Last 24 hours) at 04/11/2017 1215 Last data filed at 04/11/2017 0800 Gross per 24 hour  Intake 1290 ml  Output 2150 ml  Net -860 ml   Filed Weights   04/04/17 0500 04/07/17 0500 04/11/17 0500  Weight: 103.5 kg (228 lb 2.8 oz) 97.6 kg (215 lb 2.7 oz) 99.4 kg (219 lb 2.2 oz)    Examination:  Gen: Awake, Alert, Oriented X 3, elderly, chronically ill appearing HEENT: scalp incision, healing well,  neck contusion without leak, dressing unsaturated Lungs: clear bilaterally, improved air movement CVS: RRR,No Gallops,Rubs or new Murmurs Abd: soft, Non tender, non distended,  BS present Extremities: no edema Neuro:  strength 3+/5, bilaterally in both legs, sensations intact, DTR 1+ Skin: no new rashes Psychiatry: Judgement and insight appear normal. Mood & affect appropriate.     Data Reviewed:   CBC: Recent Labs  Lab 04/07/17 0654 04/08/17 0500 04/09/17 0500 04/10/17 0600 04/11/17 0624  WBC 4.2 4.6 5.2 6.5 5.6  HGB 9.3* 9.1* 9.4* 8.9* 9.0*  HCT 28.8* 29.7* 30.2* 29.4* 29.1*  MCV 103.2* 103.1* 102.7* 102.4* 102.8*  PLT 105* 102* 108* 115* 161*   Basic Metabolic Panel: Recent Labs  Lab 04/07/17 0654 04/08/17 0500 04/09/17 0500 04/10/17 0600 04/11/17 0624  NA 139 137 140 139 141  K 3.5 3.7 3.8 3.6 3.8  CL 111 109 112* 112* 117*  CO2 20* 19* 20* 18* 17*  GLUCOSE 165* 129* 162* 155* 168*  BUN 29* 33* 38* 40* 38*  CREATININE 0.68 0.85 0.94 0.80 0.87  CALCIUM 8.1* 8.3* 8.9 8.5* 8.7*   GFR: Estimated Creatinine Clearance: 75.6 mL/min (by C-G formula based on SCr of 0.87 mg/dL). Liver Function Tests: No results for input(s): AST, ALT, ALKPHOS, BILITOT, PROT, ALBUMIN in the last 168 hours. No results for input(s): LIPASE, AMYLASE in the last 168 hours. No results for input(s): AMMONIA in the last 168 hours. Coagulation Profile: No results for input(s): INR, PROTIME in the last 168 hours. Cardiac Enzymes: No results for input(s): CKTOTAL, CKMB, CKMBINDEX, TROPONINI in the last 168 hours. BNP (last 3 results) No results for input(s): PROBNP in the last 8760 hours. HbA1C: No results for input(s): HGBA1C in the last 72 hours. CBG: Recent Labs  Lab 04/10/17 0719 04/10/17 1208 04/10/17 1734 04/10/17 2120 04/11/17 0756  GLUCAP 170* 100* 163* 111* 165*   Lipid Profile: No results for input(s): CHOL, HDL, LDLCALC, TRIG, CHOLHDL, LDLDIRECT in the last 72 hours. Thyroid Function Tests: No results for input(s): TSH, T4TOTAL, FREET4, T3FREE, THYROIDAB in the last 72 hours. Anemia Panel: No results for input(s): VITAMINB12, FOLATE, FERRITIN,  TIBC, IRON, RETICCTPCT in the last 72 hours. Urine analysis: No results found for: COLORURINE, APPEARANCEUR, LABSPEC, PHURINE, GLUCOSEU, HGBUR, BILIRUBINUR, KETONESUR, PROTEINUR, UROBILINOGEN, NITRITE, LEUKOCYTESUR Sepsis Labs: @LABRCNTIP (procalcitonin:4,lacticidven:4)  ) Recent Results (from the past 240 hour(s))  MRSA PCR Screening     Status: None   Collection Time: 04/07/17  9:06 AM  Result Value Ref Range Status   MRSA by PCR NEGATIVE NEGATIVE Final    Comment:        The GeneXpert MRSA Assay (FDA approved for NASAL specimens only), is one component of a comprehensive MRSA colonization surveillance program. It is not intended to diagnose MRSA infection nor to guide or monitor treatment for MRSA infections. Performed at [facility], [place] [address], [place], [place] [address]          Radiology Studies: No results found.      Scheduled Meds: . acetaZOLAMIDE  250 mg Oral QID  . amLODipine  10 mg Oral Daily  . Chlorhexidine Gluconate Cloth  6 each Topical Q0600  . dexamethasone  2 mg Oral Q12H  . docusate sodium  100 mg Oral BID  . doxycycline  100 mg Oral Q12H  . feeding supplement (GLUCERNA SHAKE)  237 mL Oral BID BM  . ferrous sulfate  325 mg Oral BID WC  . insulin aspart  0-20 Units Subcutaneous TID WC  .  insulin aspart  0-5 Units Subcutaneous QHS  . insulin aspart  10 Units Subcutaneous TID WC  . insulin glargine  24 Units Subcutaneous QHS  . labetalol  200 mg Oral TID  . levETIRAcetam  500 mg Oral BID  . lisinopril  5 mg Oral Daily  . mirtazapine  15 mg Oral QHS  . nystatin-triamcinolone   Topical BID  . pantoprazole  40 mg Oral QHS  . polyethylene glycol  17 g Oral Daily  . senna  1 tablet Oral BID  . sodium chloride flush  10-40 mL Intracatheter Q12H   Continuous Infusions: . sodium chloride 10 mL/hr at 04/11/17 0800  . gentamicin 500 mg (04/11/17 1009)  . vancomycin Stopped (04/11/17 0706)     LOS: 35 days    Time spent:be  61mn    PDomenic Polite MD [facility] Page via [email or web address], password [email or web address] After 7PM please contact night-coverage  04/11/2017, 12:15 PM

## 2017-04-11 NOTE — Social Work (Addendum)
CSW aware that PT is still recommending SNF at discharge. CSW continuing to follow to support discharge when medically appropriate. CSW will need to initiate [employer] authorization when medically appropriate.   3:46pm- CSW has initiated [place] for pt through [employer], per MD note pt is nearing medical stability for discharge.   Alexander Mt, [place] Work [phone]

## 2017-04-11 NOTE — Progress Notes (Signed)
Overall reasonably stable.  She looks pretty good.  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  Her wound is healing well without any evidence of drainage.  Neurologic exam is otherwise stable.  Is progressing slowly following posterior fossa tumor excision and difficulty secondary to CSF leak.  Continue current efforts and work for improved mobilization.  Time to begin active thoughts with regard to discharge planning.

## 2017-04-12 LAB — BASIC METABOLIC PANEL
Anion gap: 8 (ref 5–15)
BUN: 38 mg/dL — ABNORMAL HIGH (ref 6–20)
CO2: 16 mmol/L — ABNORMAL LOW (ref 22–32)
Calcium: 8.4 mg/dL — ABNORMAL LOW (ref 8.9–10.3)
Chloride: 116 mmol/L — ABNORMAL HIGH (ref 101–111)
Creatinine, Ser: 0.74 mg/dL (ref 0.44–1.00)
GFR calc non Af Amer: >60 mL/min (ref >60)
Glucose, Bld: 132 mg/dL — ABNORMAL HIGH (ref 65–99)
POTASSIUM: 3.6 mmol/L (ref 3.5–5.1)
SODIUM: 140 mmol/L (ref 135–145)

## 2017-04-12 LAB — CBC
HEMATOCRIT: 29.3 % — AB (ref 36.0–46.0)
Hemoglobin: 9.1 g/dL — ABNORMAL LOW (ref 12.0–15.0)
MCH: 32.9 pg (ref 26.0–34.0)
MCHC: 31.1 g/dL (ref 30.0–36.0)
MCV: 105.8 fL — ABNORMAL HIGH (ref 78.0–100.0)
Platelets: 118 10*3/uL — ABNORMAL LOW (ref 150–400)
RBC: 2.77 MIL/uL — AB (ref 3.87–5.11)
RDW: 18.9 % — ABNORMAL HIGH (ref 11.5–15.5)
WBC: 4.7 10*3/uL (ref 4.0–10.5)

## 2017-04-12 LAB — GLUCOSE, CAPILLARY
GLUCOSE-CAPILLARY: 124 mg/dL — AB (ref 65–99)
GLUCOSE-CAPILLARY: 128 mg/dL — AB (ref 65–99)
Glucose-Capillary: 114 mg/dL — ABNORMAL HIGH (ref 65–99)
Glucose-Capillary: 115 mg/dL — ABNORMAL HIGH (ref 65–99)
Glucose-Capillary: 90 mg/dL (ref 65–99)

## 2017-04-12 LAB — VANCOMYCIN, TROUGH: Vancomycin Tr: 25 ug/mL (ref 15–20)

## 2017-04-12 MED ORDER — VANCOMYCIN HCL 500 MG IV SOLR
500.0000 mg | Freq: Two times a day (BID) | INTRAVENOUS | Status: DC
  Administered 2017-04-12 – 2017-04-13 (×2): 500 mg via INTRAVENOUS
  Filled 2017-04-12 (×3): qty 500

## 2017-04-12 NOTE — Progress Notes (Addendum)
Overall stable.  Wound remains dry.  No new neurologic symptoms.  3rd nerve palsy persist.  Plan follow-up head CT scan in morning.  If this is stable then plan for discharge when skilled nursing facility bed available.

## 2017-04-12 NOTE — Progress Notes (Signed)
Patient ID: Denise Morton, female   DOB: 1950/05/24, 67 y.o.   MRN: [id number]  This NP visited patient at the bedside palliative medicine needs and emotional support.  Presence and active listening my patient shared her thoughts and feelings regarding her current medical situation.  She remains hopeful for continued improvement and for the opportunity to proceed with treatment to treat her underlying cancer diagnosis.  She hopes for continued quality of life.  Awaits input from neurosurgery regarding the next steps in her treatment plan.  She shared stories regarding her 2 sons.    [name] shared her grief regarding the death of her mother less than 2 weeks ago.  Talked about death and dying, and view points regarding the after life.  Patient tells me she is not afraid to die but "just not ready".  Discussed with patient the importance of continued conversation with family and their  medical providers regarding overall plan of care and treatment options,  ensuring decisions are within the context of the patients values and GOCs.  She verbalizes an understanding that she knows her disease process is not curable.  Questions and concerns addressed   Discussed with bedside nursing  Total time spent on the unit was 35 minutes  Greater than 50% of the time was spent in counseling and coordination of care  Wadie Lessen NP  Palliative Medicine Team Team Phone # [phone] Pager [phone]

## 2017-04-12 NOTE — Progress Notes (Signed)
Occupational Therapy Treatment Patient Details Name: Denise Morton MRN: [id number] DOB: 1950-10-29 Today's Date: 04/12/2017    History of present illness  Denise Morton is a 67 y.o. 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  PMHx of Breast CA, DM, central retinal vein occlusion right eye   OT comments  Pt with limited activity today due to fatigue.  She requires min - max A for ADLs, and mod A for sit to stand - she fatigues quickly with activity with DOE 3/4.   Will continue to follow.   Follow Up Recommendations  SNF;Supervision/Assistance - 24 hour    Equipment Recommendations  3 in 1 bedside commode;Tub/shower seat    Recommendations for Other Services      Precautions / Restrictions Precautions Precautions: Fall       Mobility Bed Mobility Overal bed mobility: Needs Assistance Bed Mobility: Supine to Sit;Sit to Supine     Supine to sit: Min guard;HOB elevated Sit to supine: Mod assist   General bed mobility comments: heavy reliance on bedrail to move to sitting position.  Assist to lift LEs onto bed   Transfers Overall transfer level: Needs assistance Equipment used: Rolling walker (2 wheeled) Transfers: Sit to/from [facility] Sit to Stand: Mod assist Stand pivot transfers: Min assist       General transfer comment: mod A to power up into standing with bed height elevated.  Min A for balance with transfers and mobility     Balance Overall balance assessment: Needs assistance Sitting-balance support: Feet supported;Bilateral upper extremity supported Sitting balance-Leahy Scale: Fair     Standing balance support: Bilateral upper extremity supported Standing balance-Leahy Scale:  Poor Standing balance comment: requires bil.  UE support and min A                            ADL either performed or assessed with clinical judgement   ADL                           Toilet Transfer: Moderate assistance;Stand-pivot;BSC;RW Toilet Transfer Details (indicate cue type and reason): assist to power up into standing          Functional mobility during ADLs: Minimal assistance;Moderate assistance;Rolling walker       Vision   Additional Comments: pt reports difficulty seeing out of either eye today.  Glasses cleaned as they were very dirty.  Pt indicates improvement afterwards    Perception     Praxis      Cognition Arousal/Alertness: Awake/alert Behavior During Therapy: Flat affect;WFL for tasks assessed/performed Overall Cognitive Status: Impaired/Different from baseline Area of Impairment: Attention;Following commands;Problem solving                   Current Attention Level: Selective   Following Commands: Follows one step commands consistently     Problem Solving: Slow processing;Difficulty sequencing;Requires verbal cues;Requires tactile cues General Comments: Pt very slow to process information today         Exercises     Shoulder Instructions       General Comments Pt stood x 2 for up to 4 mins DOE 3/4  Pertinent Vitals/ Pain       Pain Assessment: No/denies pain  Home Living                                          Prior Functioning/Environment              Frequency  Min 3X/week        Progress Toward Goals  OT Goals(current goals can now be found in the care plan section)  Progress towards OT goals: Not progressing toward goals - comment(fatigue )     Plan Discharge plan remains appropriate    Co-evaluation                 AM-PAC PT "6 Clicks" Daily Activity     Outcome Measure   Help from another person eating meals?: None Help from another person taking  care of personal grooming?: A Little Help from another person toileting, which includes using toliet, bedpan, or urinal?: A Lot Help from another person bathing (including washing, rinsing, drying)?: A Lot Help from another person to put on and taking off regular upper body clothing?: A Lot Help from another person to put on and taking off regular lower body clothing?: Total 6 Click Score: 14    End of Session Equipment Utilized During Treatment: Rolling walker  OT Visit Diagnosis: Unsteadiness on feet (R26.81);Other symptoms and signs involving cognitive function   Activity Tolerance Patient limited by fatigue   Patient Left in bed;with call bell/phone within reach;with bed alarm set   Nurse Communication Mobility status        Time: 0165-5374 OT Time Calculation (min): 21 min  Charges: OT General Charges $OT Visit: 1 Visit OT Treatments $Therapeutic Activity: 8-22 mins  [facility], OTR/L [phone]    Lucille Passy M 04/12/2017, 3:02 PM

## 2017-04-12 NOTE — Progress Notes (Signed)
Pharmacy Antibiotic Note  Denise Morton is a 67 y.o. female admitted on 03/07/2017 with surgical prophylaxis.   PT is s/p ventriculostomy. Continues on vancomycin, doxycycline, and gentamicin until CSF leak stops per Dr Saintclair Halsted. Noted CT likely planned for tomorrow, will follow-up with plan to discontinue prophylaxis after scan.   Patient remains afebrile, WBC wnl, and sCr stable after slight bump last week. Vancomycin trough high this morning, will reduce dose and order a gentamicin level before next dose.   3/20 Vanc trough - 16 3/27 Vanc trough - 25 on 750mg  BID  3/20 Gent tr - 0.9  Plan: Reduce vancomycin to 500 mg IV q12h Continue gentamicin 500 mg IV q36h Continue doxycycline 100mg  PO q12h Monitor renal fx and plan to discontinue prophylaxis   Height: 5\' 6"  (167.6 cm) Weight: 219 lb 2.2 oz (99.4 kg) IBW/kg (Calculated) : 59.3  Temp (24hrs), Avg:98.1 F (36.7 C), Min:97.5 F (36.4 C), Max:98.6 F (37 C)  Recent Labs  Lab 04/08/17 0500 04/09/17 0500 04/10/17 0600 04/11/17 0624 04/12/17 0600  WBC 4.6 5.2 6.5 5.6 4.7  CREATININE 0.85 0.94 0.80 0.87 0.74  VANCOTROUGH  --   --   --   --  25*    Estimated Creatinine Clearance: 82.2 mL/min (by C-G formula based on SCr of 0.74 mg/dL).    Allergies  Allergen Reactions  . Penicillin G Rash    Has patient had a PCN reaction causing immediate rash, facial/tongue/throat swelling, SOB or lightheadedness with hypotension: Unknown Has patient had a PCN reaction causing severe rash involving mucus membranes or skin necrosis: Unknown Has patient had a PCN reaction that required hospitalization: Unknown Has patient had a PCN reaction occurring within the last 10 years: Unknown If all of the above answers are "NO", then may proceed with Cephalosporin use.   [name] Antibiotics Rash    Georga Bora, PharmD Clinical Pharmacist 04/12/2017 7:34 AM

## 2017-04-12 NOTE — Progress Notes (Addendum)
MD: CSW has received insurance authorization for SNF stay.  Please advise as to timing of CT and discharge, so that we may prepare facility.    Thank you,  Reinaldo Raddle, RN, BSN  Trauma/Neuro ICU Case Manager [phone]

## 2017-04-12 NOTE — Progress Notes (Signed)
Pt seen, no complaints, wondering when she will go to rehab -CBgs stable, BP better -I will check back tomorrow -continue care per Dr.Cram  Domenic Polite, MD

## 2017-04-12 NOTE — Progress Notes (Signed)
[employer] auth received- [id number] approved for 14 days  Jorge Ny, LCSW Clinical Social Worker [phone]

## 2017-04-13 ENCOUNTER — Inpatient Hospital Stay (HOSPITAL_COMMUNITY): Payer: PPO

## 2017-04-13 LAB — BASIC METABOLIC PANEL
ANION GAP: 11 (ref 5–15)
BUN: 35 mg/dL — ABNORMAL HIGH (ref 6–20)
CALCIUM: 8.6 mg/dL — AB (ref 8.9–10.3)
CO2: 14 mmol/L — ABNORMAL LOW (ref 22–32)
CREATININE: 0.88 mg/dL (ref 0.44–1.00)
Chloride: 117 mmol/L — ABNORMAL HIGH (ref 101–111)
GLUCOSE: 162 mg/dL — AB (ref 65–99)
Potassium: 3.4 mmol/L — ABNORMAL LOW (ref 3.5–5.1)
Sodium: 142 mmol/L (ref 135–145)

## 2017-04-13 LAB — GENTAMICIN LEVEL, TROUGH: GENTAMICIN TR: 1.4 ug/mL (ref 0.5–2.0)

## 2017-04-13 LAB — GLUCOSE, CAPILLARY
GLUCOSE-CAPILLARY: 120 mg/dL — AB (ref 65–99)
GLUCOSE-CAPILLARY: 135 mg/dL — AB (ref 65–99)
Glucose-Capillary: 142 mg/dL — ABNORMAL HIGH (ref 65–99)
Glucose-Capillary: 115 mg/dL — ABNORMAL HIGH (ref 65–99)

## 2017-04-13 LAB — CBC
HCT: 30.4 % — ABNORMAL LOW (ref 36.0–46.0)
HEMOGLOBIN: 9.2 g/dL — AB (ref 12.0–15.0)
MCH: 32.9 pg (ref 26.0–34.0)
MCHC: 30.3 g/dL (ref 30.0–36.0)
MCV: 108.6 fL — ABNORMAL HIGH (ref 78.0–100.0)
PLATELETS: 96 10*3/uL — AB (ref 150–400)
RBC: 2.8 MIL/uL — AB (ref 3.87–5.11)
RDW: 20.9 % — ABNORMAL HIGH (ref 11.5–15.5)
WBC: 4.1 10*3/uL (ref 4.0–10.5)

## 2017-04-13 MED ORDER — GENTAMICIN SULFATE 40 MG/ML IJ SOLN: 500.0000 mg | INTRAMUSCULAR | Status: DC

## 2017-04-13 NOTE — Progress Notes (Signed)
Pt seen, improving well -repeat CT head with decreasing size of pneumocephalus -Diabetes and hypertension seem to be well controlled on current regimen -Nothing further to add, we will sign off -Please call if we can be of any assistance  Domenic Polite, MD

## 2017-04-13 NOTE — Progress Notes (Signed)
Head CT with resolution of bifrontal air.  Small convexity subdural hygromas present.  Patient does have a significant postoperative pseudomeningocele but still has no evidence of any external drainage.  Wound healing well.  Overall the patient doing about as well as could be hoped.  Her 3rd nerve palsy will likely persist indefinitely.  I think efforts can begin toward skilled nursing facility placement for further convalescence.

## 2017-04-13 NOTE — Social Work (Signed)
CSW has spoken with pt preferred facility, they are checking for bed availability. Pt will have to make alternate choice if no bed availability, pt has insurance auth to discharge to SNF when we have heard from facility and made appropriate arrangements.  Alexander Mt, [place] Work [phone]

## 2017-04-13 NOTE — Progress Notes (Signed)
Pharmacy Antibiotic Note  Denise Morton is a 67 y.o. female admitted on 03/07/2017 with surgical prophylaxis.   PT is s/p ventriculostomy. Continues on vancomycin, doxycycline, and gentamicin until CSF leak stops per Dr Saintclair Halsted. Noted CT likely planned for tomorrow, will follow-up with plan to discontinue prophylaxis after scan.   Patient remains afebrile, WBC wnl, and sCr stable after slight bump last week. Vancomycin trough high this morning, will reduce dose and order a gentamicin level before next dose.   3/20 Vanc trough - 16 3/27 Vanc trough - 25 on 750mg  BID  3/20 Gent trough - 0.9 3/27 Gent trough - 1.4 - dose administered before this lab resulted  Plan: Reduce vancomycin to 500 mg IV q12h Change gentamicin 500 mg IV to q48h Continue doxycycline 100mg  PO q12h Monitor renal fx and plan to discontinue prophylaxis   Height: 5\' 6"  (167.6 cm) Weight: 218 lb 7.6 oz (99.1 kg) IBW/kg (Calculated) : 59.3  Temp (24hrs), Avg:98.1 F (36.7 C), Min:97.7 F (36.5 C), Max:98.4 F (36.9 C)  Recent Labs  Lab 04/08/17 0500 04/09/17 0500 04/10/17 0600 04/11/17 0624 04/12/17 0600 04/12/17 2209 04/13/17 0500  WBC 4.6 5.2 6.5 5.6 4.7  --  4.1  CREATININE 0.85 0.94 0.80 0.87 0.74  --   --   VANCOTROUGH  --   --   --   --  25*  --   --   GENTTROUGH  --   --   --   --   --  1.4  --     Estimated Creatinine Clearance: 82.1 mL/min (by C-G formula based on SCr of 0.74 mg/dL).    Allergies  Allergen Reactions  . Penicillin G Rash    Has patient had a PCN reaction causing immediate rash, facial/tongue/throat swelling, SOB or lightheadedness with hypotension: Unknown Has patient had a PCN reaction causing severe rash involving mucus membranes or skin necrosis: Unknown Has patient had a PCN reaction that required hospitalization: Unknown Has patient had a PCN reaction occurring within the last 10 years: Unknown If all of the above answers are "NO", then may proceed with Cephalosporin use.    [name] Antibiotics Rash    Georga Bora, PharmD Clinical Pharmacist 04/13/2017 7:37 AM

## 2017-04-13 NOTE — Social Work (Signed)
CSW has spoken with pt preferred facility, they are checking for bed availability. Pt will have to make alternate choice if no bed availability, pt has insurance auth to discharge to SNF when we have heard from facility and made appropriate arrangements.   3:08pm- CSW spoke with pt son on telephone, pt son Shanon Brow aware that MD is stating pt is stable and leak has resolved. Pt son concerned due to the previous discharge being cancelled due to leak. Pt son has not gotten MD update in awhile and would just like to hear from an MD prior to discharge. RN Case Manager alerted MD, he wil speak with son, discharge tomorrow to [facility].  Alexander Mt, [place] Work [phone]

## 2017-04-14 DIAGNOSIS — F321 Major depressive disorder, single episode, moderate: Secondary | ICD-10-CM | POA: Diagnosis not present

## 2017-04-14 DIAGNOSIS — C50411 Malignant neoplasm of upper-outer quadrant of right female breast: Secondary | ICD-10-CM | POA: Diagnosis not present

## 2017-04-14 DIAGNOSIS — S062X9D Diffuse traumatic brain injury with loss of consciousness of unspecified duration, subsequent encounter: Secondary | ICD-10-CM | POA: Diagnosis not present

## 2017-04-14 DIAGNOSIS — K5900 Constipation, unspecified: Secondary | ICD-10-CM | POA: Diagnosis not present

## 2017-04-14 DIAGNOSIS — R278 Other lack of coordination: Secondary | ICD-10-CM | POA: Diagnosis not present

## 2017-04-14 DIAGNOSIS — Z483 Aftercare following surgery for neoplasm: Secondary | ICD-10-CM | POA: Diagnosis not present

## 2017-04-14 DIAGNOSIS — C7800 Secondary malignant neoplasm of unspecified lung: Secondary | ICD-10-CM | POA: Diagnosis not present

## 2017-04-14 DIAGNOSIS — R531 Weakness: Secondary | ICD-10-CM | POA: Diagnosis not present

## 2017-04-14 DIAGNOSIS — E569 Vitamin deficiency, unspecified: Secondary | ICD-10-CM | POA: Diagnosis not present

## 2017-04-14 DIAGNOSIS — Z9889 Other specified postprocedural states: Secondary | ICD-10-CM | POA: Diagnosis not present

## 2017-04-14 DIAGNOSIS — C50919 Malignant neoplasm of unspecified site of unspecified female breast: Secondary | ICD-10-CM | POA: Diagnosis not present

## 2017-04-14 DIAGNOSIS — Z853 Personal history of malignant neoplasm of breast: Secondary | ICD-10-CM | POA: Diagnosis not present

## 2017-04-14 DIAGNOSIS — R4701 Aphasia: Secondary | ICD-10-CM | POA: Diagnosis not present

## 2017-04-14 DIAGNOSIS — Z9089 Acquired absence of other organs: Secondary | ICD-10-CM | POA: Diagnosis not present

## 2017-04-14 DIAGNOSIS — E785 Hyperlipidemia, unspecified: Secondary | ICD-10-CM | POA: Diagnosis not present

## 2017-04-14 DIAGNOSIS — R609 Edema, unspecified: Secondary | ICD-10-CM | POA: Diagnosis not present

## 2017-04-14 DIAGNOSIS — G629 Polyneuropathy, unspecified: Secondary | ICD-10-CM | POA: Diagnosis not present

## 2017-04-14 DIAGNOSIS — E1165 Type 2 diabetes mellitus with hyperglycemia: Secondary | ICD-10-CM | POA: Diagnosis not present

## 2017-04-14 DIAGNOSIS — R197 Diarrhea, unspecified: Secondary | ICD-10-CM | POA: Diagnosis not present

## 2017-04-14 DIAGNOSIS — R55 Syncope and collapse: Secondary | ICD-10-CM | POA: Diagnosis not present

## 2017-04-14 DIAGNOSIS — R4182 Altered mental status, unspecified: Secondary | ICD-10-CM | POA: Diagnosis not present

## 2017-04-14 DIAGNOSIS — Z4789 Encounter for other orthopedic aftercare: Secondary | ICD-10-CM | POA: Diagnosis not present

## 2017-04-14 DIAGNOSIS — Z794 Long term (current) use of insulin: Secondary | ICD-10-CM | POA: Diagnosis not present

## 2017-04-14 DIAGNOSIS — E639 Nutritional deficiency, unspecified: Secondary | ICD-10-CM | POA: Diagnosis not present

## 2017-04-14 DIAGNOSIS — M6281 Muscle weakness (generalized): Secondary | ICD-10-CM | POA: Diagnosis not present

## 2017-04-14 DIAGNOSIS — R05 Cough: Secondary | ICD-10-CM | POA: Diagnosis not present

## 2017-04-14 DIAGNOSIS — Z7952 Long term (current) use of systemic steroids: Secondary | ICD-10-CM | POA: Diagnosis not present

## 2017-04-14 DIAGNOSIS — R2689 Other abnormalities of gait and mobility: Secondary | ICD-10-CM | POA: Diagnosis not present

## 2017-04-14 DIAGNOSIS — Z9221 Personal history of antineoplastic chemotherapy: Secondary | ICD-10-CM | POA: Diagnosis not present

## 2017-04-14 DIAGNOSIS — C7931 Secondary malignant neoplasm of brain: Secondary | ICD-10-CM | POA: Diagnosis not present

## 2017-04-14 DIAGNOSIS — I10 Essential (primary) hypertension: Secondary | ICD-10-CM | POA: Diagnosis not present

## 2017-04-14 DIAGNOSIS — R52 Pain, unspecified: Secondary | ICD-10-CM | POA: Diagnosis not present

## 2017-04-14 DIAGNOSIS — Z171 Estrogen receptor negative status [ER-]: Secondary | ICD-10-CM | POA: Diagnosis not present

## 2017-04-14 DIAGNOSIS — C7951 Secondary malignant neoplasm of bone: Secondary | ICD-10-CM | POA: Diagnosis not present

## 2017-04-14 DIAGNOSIS — I469 Cardiac arrest, cause unspecified: Secondary | ICD-10-CM | POA: Diagnosis not present

## 2017-04-14 DIAGNOSIS — Z111 Encounter for screening for respiratory tuberculosis: Secondary | ICD-10-CM | POA: Diagnosis not present

## 2017-04-14 DIAGNOSIS — R110 Nausea: Secondary | ICD-10-CM | POA: Diagnosis not present

## 2017-04-14 DIAGNOSIS — D649 Anemia, unspecified: Secondary | ICD-10-CM | POA: Diagnosis not present

## 2017-04-14 DIAGNOSIS — Z79899 Other long term (current) drug therapy: Secondary | ICD-10-CM | POA: Diagnosis not present

## 2017-04-14 DIAGNOSIS — B379 Candidiasis, unspecified: Secondary | ICD-10-CM | POA: Diagnosis not present

## 2017-04-14 DIAGNOSIS — C719 Malignant neoplasm of brain, unspecified: Secondary | ICD-10-CM | POA: Diagnosis not present

## 2017-04-14 DIAGNOSIS — Z923 Personal history of irradiation: Secondary | ICD-10-CM | POA: Diagnosis not present

## 2017-04-14 DIAGNOSIS — Z87891 Personal history of nicotine dependence: Secondary | ICD-10-CM | POA: Diagnosis not present

## 2017-04-14 DIAGNOSIS — F4320 Adjustment disorder, unspecified: Secondary | ICD-10-CM | POA: Diagnosis not present

## 2017-04-14 DIAGNOSIS — E119 Type 2 diabetes mellitus without complications: Secondary | ICD-10-CM | POA: Diagnosis not present

## 2017-04-14 DIAGNOSIS — R0602 Shortness of breath: Secondary | ICD-10-CM | POA: Diagnosis not present

## 2017-04-14 DIAGNOSIS — C787 Secondary malignant neoplasm of liver and intrahepatic bile duct: Secondary | ICD-10-CM | POA: Diagnosis not present

## 2017-04-14 DIAGNOSIS — R600 Localized edema: Secondary | ICD-10-CM | POA: Diagnosis not present

## 2017-04-14 DIAGNOSIS — C799 Secondary malignant neoplasm of unspecified site: Secondary | ICD-10-CM | POA: Diagnosis not present

## 2017-04-14 DIAGNOSIS — R402411 Glasgow coma scale score 13-15, in the field [EMT or ambulance]: Secondary | ICD-10-CM | POA: Diagnosis not present

## 2017-04-14 DIAGNOSIS — Z170 Estrogen receptor positive status [ER+]: Secondary | ICD-10-CM | POA: Diagnosis not present

## 2017-04-14 DIAGNOSIS — E441 Mild protein-calorie malnutrition: Secondary | ICD-10-CM | POA: Diagnosis not present

## 2017-04-14 DIAGNOSIS — R0902 Hypoxemia: Secondary | ICD-10-CM | POA: Diagnosis not present

## 2017-04-14 DIAGNOSIS — E1340 Other specified diabetes mellitus with diabetic neuropathy, unspecified: Secondary | ICD-10-CM | POA: Diagnosis not present

## 2017-04-14 LAB — GLUCOSE, CAPILLARY
GLUCOSE-CAPILLARY: 95 mg/dL (ref 65–99)
GLUCOSE-CAPILLARY: 106 mg/dL — AB (ref 65–99)
Glucose-Capillary: 113 mg/dL — ABNORMAL HIGH (ref 65–99)

## 2017-04-14 LAB — BASIC METABOLIC PANEL
Anion gap: 6 (ref 5–15)
BUN: 38 mg/dL — AB (ref 6–20)
CHLORIDE: 115 mmol/L — AB (ref 101–111)
CO2: 18 mmol/L — AB (ref 22–32)
Calcium: 8.5 mg/dL — ABNORMAL LOW (ref 8.9–10.3)
Creatinine, Ser: 0.91 mg/dL (ref 0.44–1.00)
GFR calc Af Amer: >60 mL/min (ref >60)
GFR calc non Af Amer: >60 mL/min (ref >60)
Glucose, Bld: 103 mg/dL — ABNORMAL HIGH (ref 65–99)
POTASSIUM: 3.6 mmol/L (ref 3.5–5.1)
Sodium: 139 mmol/L (ref 135–145)

## 2017-04-14 LAB — CBC
HEMATOCRIT: 30 % — AB (ref 36.0–46.0)
Hemoglobin: 9.3 g/dL — ABNORMAL LOW (ref 12.0–15.0)
MCH: 32.4 pg (ref 26.0–34.0)
MCHC: 31 g/dL (ref 30.0–36.0)
MCV: 104.5 fL — AB (ref 78.0–100.0)
PLATELETS: 141 10*3/uL — AB (ref 150–400)
RBC: 2.87 MIL/uL — AB (ref 3.87–5.11)
RDW: 18.8 % — AB (ref 11.5–15.5)
WBC: 3.8 10*3/uL — AB (ref 4.0–10.5)

## 2017-04-14 MED ORDER — DOCUSATE SODIUM 100 MG PO CAPS: 100.0000 mg | ORAL_CAPSULE | Freq: Two times a day (BID) | ORAL | 0 refills | Status: AC

## 2017-04-14 MED ORDER — PANTOPRAZOLE SODIUM 40 MG PO TBEC: 40.0000 mg | DELAYED_RELEASE_TABLET | Freq: Every day | ORAL | 1 refills | Status: AC

## 2017-04-14 MED ORDER — PROMETHAZINE HCL 12.5 MG PO TABS: 12.5000 mg | ORAL_TABLET | ORAL | 0 refills | Status: AC | PRN

## 2017-04-14 MED ORDER — DEXAMETHASONE 2 MG PO TABS: 2.0000 mg | ORAL_TABLET | Freq: Two times a day (BID) | ORAL | 0 refills | Status: AC

## 2017-04-14 MED ORDER — BISACODYL 10 MG RE SUPP: 10.0000 mg | Freq: Every day | RECTAL | 0 refills | Status: AC | PRN

## 2017-04-14 MED ORDER — INSULIN GLARGINE 100 UNIT/ML ~~LOC~~ SOLN: 24.0000 [IU] | Freq: Every day | SUBCUTANEOUS | 11 refills | Status: AC

## 2017-04-14 MED ORDER — LABETALOL HCL 200 MG PO TABS: 200.0000 mg | ORAL_TABLET | Freq: Three times a day (TID) | ORAL | 1 refills | Status: AC

## 2017-04-14 MED ORDER — HEPARIN SOD (PORK) LOCK FLUSH 100 UNIT/ML IV SOLN
500.0000 [IU] | INTRAVENOUS | Status: AC | PRN
  Administered 2017-04-14: 500 [IU]

## 2017-04-14 MED ORDER — ONDANSETRON HCL 4 MG PO TABS: 4.0000 mg | ORAL_TABLET | ORAL | 0 refills | Status: AC | PRN

## 2017-04-14 MED ORDER — SENNA 8.6 MG PO TABS: 1.0000 | ORAL_TABLET | Freq: Two times a day (BID) | ORAL | 0 refills | Status: AC

## 2017-04-14 MED ORDER — LISINOPRIL 5 MG PO TABS: 5.0000 mg | ORAL_TABLET | Freq: Every day | ORAL | 1 refills | Status: AC

## 2017-04-14 MED ORDER — POLYETHYLENE GLYCOL 3350 17 G PO PACK: 17.0000 g | PACK | Freq: Every day | ORAL | 0 refills | Status: AC

## 2017-04-14 NOTE — Progress Notes (Signed)
Physical Therapy Treatment Patient Details Name: Denise Morton MRN: [id number] DOB: 11-15-1950 Today's Date: 04/14/2017    History of Present Illness  Denise Morton is a 66 y.o. 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  PMHx of Breast CA, DM, central retinal vein occlusion right eye    PT Comments    Pt continues to be limited by DOE during gait and her most challenging part of mobility is the transition from sit to stand.  She is unable to power up well from lower surfaces.  She is due to d/c to SNF today.  PT to follow acutely until d/c confirmed.      Follow Up Recommendations  Supervision/Assistance - 24 hour;SNF     Equipment Recommendations  Rolling walker with 5" wheels    Recommendations for Other Services   NA     Precautions / Restrictions Precautions Precautions: Fall Restrictions Weight Bearing Restrictions: No    Mobility  Bed Mobility Overal bed mobility: Needs Assistance Bed Mobility: Supine to Sit     Supine to sit: Supervision;HOB elevated     General bed mobility comments: Supervision for safety as she almost rolled herself off the side of the bed.  Heavy reliance on rail for support and HOB elevated ~45 degrees.   Transfers Overall transfer level: Needs assistance Equipment used: 4-wheeled walker Transfers: Sit to/from Stand Sit to Stand: Mod assist;From elevated surface;+2 safety/equipment         General transfer comment: One person assist to stand from elevated bed, two person assist needed to stand from lower toilet in the bathroom.  Pt self reports low surfaces are hard for her.  Chair elevated (already) to help staff get pt up out of recliner later.    Ambulation/Gait Ambulation/Gait assistance: Min assist Ambulation Distance (Feet): 15 Feet Assistive device: Rolling walker (2 wheeled) Gait Pattern/deviations: Decreased step length - right;Shuffle Gait velocity: decreased   General Gait Details: Pt with slow, shuffling gait pattern, tends to ER and drag right foot a bit more than left cues to take bigger steps.            Balance Overall balance assessment: Needs assistance Sitting-balance support: Feet supported;Bilateral upper extremity supported Sitting balance-Leahy Scale: Fair     Standing balance support: Bilateral upper extremity supported Standing balance-Leahy Scale: Poor                              Cognition Arousal/Alertness: Awake/alert Behavior During Therapy: Flat affect Overall Cognitive Status: Impaired/Different from baseline Area of Impairment: Attention;Following commands;Problem solving                   Current Attention Level: Selective Memory: Decreased short-term memory Following Commands: Follows one step commands consistently Safety/Judgement: Decreased awareness of deficits   Problem Solving: Slow processing;Difficulty sequencing;Requires verbal cues;Requires tactile cues General Comments: Pt's processing speed seems to be improving, but not yet normal.  She does well with simple, one step commands and multimodal cues.          General Comments General comments (skin integrity, edema, etc.): Pt continues to need cues for PLB during gait as she gets 2-3/4 DOE.  O2 sats stable on  RA.       Pertinent Vitals/Pain Pain Assessment: Faces Faces Pain Scale: Hurts little more Pain Location: head Pain Descriptors / Indicators: Aching Pain Intervention(s): Limited activity within patient's tolerance;Monitored during session;Repositioned           PT Goals (current goals can now be found in the care plan section) Progress towards PT goals: Progressing toward goals     Frequency    Min 3X/week      PT Plan Current plan remains appropriate       AM-PAC PT "6 Clicks" Daily Activity  Outcome Measure  Difficulty turning over in bed (including adjusting bedclothes, sheets and blankets)?: A Lot Difficulty moving from lying on back to sitting on the side of the bed? : A Lot Difficulty sitting down on and standing up from a chair with arms (e.g., wheelchair, bedside commode, etc,.)?: Unable Help needed moving to and from a bed to chair (including a wheelchair)?: A Little Help needed walking in hospital room?: A Little Help needed climbing 3-5 steps with a railing? : Total 6 Click Score: 12    End of Session Equipment Utilized During Treatment: Gait belt Activity Tolerance: Patient limited by fatigue Patient left: in chair;with call bell/phone within reach   PT Visit Diagnosis: Other symptoms and signs involving the nervous system (R29.898);Muscle weakness (generalized) (M62.81);Other abnormalities of gait and mobility (R26.89)     Time: 0768-0881 PT Time Calculation (min) (ACUTE ONLY): 30 min  Charges:  $Gait Training: 8-22 mins $Therapeutic Activity: 8-22 mins          Denise Morton, PT, DPT [phone]            04/14/2017, 11:48 AM

## 2017-04-14 NOTE — Progress Notes (Addendum)
Report attempted to be given to [facility].  Will attempt again prior to d/c with [facility]. Report attempted second time.  Report attempted third time, receptionist attempted transfer, received voicemail again.  RN called back, Report given 1715 to [facility] facility.

## 2017-04-14 NOTE — Discharge Summary (Signed)
Physician Discharge Summary  Patient ID: Denise Morton MRN: [id number] DOB/AGE: [birth year] 67 y.o.  Admit date: 03/07/2017 Discharge date: 04/14/2017  Admission Diagnoses:  Discharge Diagnoses:  Active Problems:   Syncope   Brain metastases (HCC)   Acute blood loss anemia   Diabetes mellitus type 2 in obese (HCC)   Hyperkalemia   Leukocytosis   Post-operative pain   Steroid-induced hyperglycemia   Vasogenic edema (HCC)   CSF leak   Goals of care, counseling/discussion   Palliative care by specialist   Weakness generalized   Pressure injury of skin   DNR (do not resuscitate)   Discharged Condition: fair  Hospital Course: Patient admitted to the hospital for treatment of a metastatic posterior fossa mass secondary to metastatic breast carcinoma postoperative course complicated by cerebrospinal fluid leakage at her incision site.  This was reoperated on upon a number of times and eventually the wound is now closed and healing reasonably well.  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  Currently the patient is making progress with therapy.  She remains ataxic.  She has no headache.  Wound is healing well.  Consults:   Significant Diagnostic Studies:   Treatments:   Discharge Exam: Blood pressure 136/90, pulse 74, temperature 97.7 F (36.5 C), temperature source Oral, resp. rate 14, height 5\' 6"  (1.676 m), weight 96.9 kg (213 lb 10 oz), SpO2 99 %. Patient is awake and alert.  She is oriented and reasonably appropriate.  Speech is fluent.  Judgment and insight are intact.  Cranial nerve function with a left-sided 3rd nerve palsy otherwise intact.  Sensory examination nonfocal.  Motor examination intact.  Cerebellar function revealed some mild ataxia and gait unsteadiness.  Examination head ears eyes nose throat is unremarkable her chest and abdomen are benign.  Disposition: Discharge disposition:  03-Skilled Nursing Facility       Discharge Instructions    Call MD for:  difficulty breathing, headache or visual disturbances   Complete by:  As directed    Call MD for:  persistant dizziness or light-headedness   Complete by:  As directed    Call MD for:  persistant nausea and vomiting   Complete by:  As directed    Call MD for:  severe uncontrolled pain   Complete by:  As directed    Diet - low sodium heart healthy   Complete by:  As directed    Increase activity slowly   Complete by:  As directed      Allergies as of 04/14/2017      Reactions   Penicillin G Rash   Has patient had a PCN reaction causing immediate rash, facial/tongue/throat swelling, SOB or lightheadedness with hypotension: Unknown Has patient had a PCN reaction causing severe rash involving mucus membranes or skin necrosis: Unknown Has patient had a PCN reaction that required hospitalization: Unknown Has patient had a PCN reaction occurring within the last 10 years: Unknown If all of the above answers are "NO", then may proceed with Cephalosporin use.   Sulfa Antibiotics Rash      Medication List    STOP taking these medications   loperamide 2 MG capsule Commonly known as:  IMODIUM   prochlorperazine 10 MG tablet Commonly known as:  COMPAZINE     TAKE these medications   amLODipine 10 MG tablet Commonly known as:  NORVASC Take 1 tablet (10 mg total) by mouth daily.   bacitracin ointment Apply  topically as needed for wound care.   bisacodyl 5 MG EC tablet Commonly known as:  DULCOLAX Take 2 tablets (10 mg total) by mouth daily as needed for moderate constipation.   bisacodyl 5 MG EC tablet Commonly known as:  DULCOLAX Take 1 tablet (5 mg total) by mouth daily as needed for moderate constipation.   bisacodyl 10 MG suppository Commonly known as:  DULCOLAX Place 1 suppository (10 mg total) rectally daily as needed for moderate constipation.   carvedilol 25 MG tablet Commonly known as:   COREG Take 1 tablet (25 mg total) by mouth 2 (two) times daily with a meal. What changed:    medication strength  how much to take   dexamethasone 4 MG tablet Commonly known as:  DECADRON Take 1 tablet (4 mg total) by mouth every 8 (eight) hours.   dexamethasone 2 MG tablet Commonly known as:  DECADRON Take 1 tablet (2 mg total) by mouth every 12 (twelve) hours.   docusate sodium 100 MG capsule Commonly known as:  COLACE Take 1 capsule (100 mg total) by mouth 2 (two) times daily.   docusate sodium 100 MG capsule Commonly known as:  COLACE Take 1 capsule (100 mg total) by mouth 2 (two) times daily.   doxycycline 100 MG tablet Commonly known as:  VIBRA-TABS Take 1 tablet (100 mg total) by mouth every 12 (twelve) hours.   ferrous sulfate 325 (65 FE) MG tablet Take 1 tablet (325 mg total) by mouth 2 (two) times daily with a meal.   gabapentin 300 MG capsule Commonly known as:  NEURONTIN Take 1 capsule (300 mg total) by mouth 3 (three) times daily.   insulin aspart 100 UNIT/ML injection Commonly known as:  novoLOG Inject 4 Units into the skin 3 (three) times daily with meals.   insulin aspart 100 UNIT/ML injection Commonly known as:  novoLOG Inject 6 Units into the skin 3 (three) times daily with meals.   insulin glargine 100 UNIT/ML injection Commonly known as:  LANTUS Inject 0.2 mLs (20 Units total) into the skin at bedtime.   insulin glargine 100 UNIT/ML injection Commonly known as:  LANTUS Inject 0.24 mLs (24 Units total) into the skin at bedtime.   labetalol 200 MG tablet Commonly known as:  NORMODYNE Take 1 tablet (200 mg total) by mouth 3 (three) times daily.   lidocaine-prilocaine cream Commonly known as:  EMLA APPLY TO THE AFFECTED AREA ONCE AS DIRECTED   lisinopril 5 MG tablet Commonly known as:  PRINIVIL,ZESTRIL Take 1 tablet (5 mg total) by mouth daily. Start taking on:  04/15/2017   mirtazapine 15 MG disintegrating tablet Commonly known as:   REMERON SOL-TAB Take 1 tablet (15 mg total) by mouth at bedtime.   multivitamin with minerals Tabs tablet Take 1 tablet by mouth daily.   nystatin-triamcinolone cream Commonly known as:  MYCOLOG II Apply topically 2 (two) times daily.   ondansetron 8 MG tablet Commonly known as:  ZOFRAN Take 1 tablet (8 mg total) by mouth every 8 (eight) hours as needed for nausea. What changed:  Another medication with the same name was added. Make sure you understand how and when to take each.   ondansetron 4 MG tablet Commonly known as:  ZOFRAN Take 1 tablet (4 mg total) by mouth every 4 (four) hours as needed for nausea or vomiting. What changed:  You were already taking a medication with the same name, and this prescription was added. Make sure you understand how and when to  take each.   oxyCODONE 5 MG immediate release tablet Commonly known as:  Oxy IR/ROXICODONE Take 1 tablet (5 mg total) by mouth every 4 (four) hours as needed for moderate pain.   pantoprazole 40 MG tablet Commonly known as:  PROTONIX Take 1 tablet (40 mg total) by mouth at bedtime.   polyethylene glycol packet Commonly known as:  MIRALAX / GLYCOLAX Take 17 g by mouth daily. Start taking on:  04/15/2017   promethazine 12.5 MG tablet Commonly known as:  PHENERGAN Take 1-2 tablets (12.5-25 mg total) by mouth every 4 (four) hours as needed for refractory nausea / vomiting.   senna 8.6 MG Tabs tablet Commonly known as:  SENOKOT Take 1 tablet (8.6 mg total) by mouth 2 (two) times daily.       Contact information for follow-up providers    Seward Carol, MD Follow up.   Specialty:  Internal Medicine Contact information: [address] [place] [address] [phone]        Kary Kos, MD Follow up.   Specialty:  Neurosurgery Contact information: [address] [place] [address] [phone]            Contact information for after-discharge care    Destination     [facility] .   Service:  Skilled Nursing Contact information: [address] [place] [phone]                  Signed: Cooper Render Nidal Rivet 04/14/2017, 10:40 AM

## 2017-04-14 NOTE — Progress Notes (Signed)
Hematology oncology I met the patient along with her son in the room. She was having significant difficulty in transferring from chair to the bed. I reviewed the patient's chart.  She is getting discharged to rehabilitation [place].  Metastatic breast cancer with brain metastases HER-2 positive disease: I discussed with the patient about prognosis.  Patient does not have any evidence of systemic disease.  The largest metastatic lesion in the brain was removed.  She is likely to receive palliative whole brain radiation therapy. I believe that the patient has a decent prognosis if she is able to recover from the generalized weakness.  She is likely to have a few years.  Patient has no intention of pursuing hospice.  She clearly stated that she wants to live and will do everything in her power to get stronger and better.  Plan: I would like to add lapatinib to whole brain radiation therapy. After the completion of radiation, she will go on lapatinib Herceptin. I briefly discussed with her the lapatinib side effects which include diarrhea and rash.  Patient has an appointment to see radiation oncology on 05/02/2017.  I will arrange a follow-up on the same day to see me as well.

## 2017-04-14 NOTE — Clinical Social Work Placement (Addendum)
   CLINICAL SOCIAL WORK PLACEMENT  NOTE [facility] Rm [phone] RN to call report to [phone]   Date:  04/14/2017  Patient Details  Name: Denise Morton MRN: [id number] Date of Birth: 04-16-1950  Clinical Social Work is seeking post-discharge placement for this patient at the [place] level of care (*CSW will initial, date and re-position this form in  chart as items are completed):  Yes   Patient/family provided with [place] Work Department's list of facilities offering this level of care within the geographic area requested by the patient (or if unable, by the patient's family).  Yes   Patient/family informed of their freedom to choose among providers that offer the needed level of care, that participate in Medicare, Medicaid or managed care program needed by the patient, have an available bed and are willing to accept the patient.  Yes   Patient/family informed of [facility]'s ownership interest in [facility] and [facility], as well as of the fact that they are under no obligation to receive care at these facilities.  PASRR submitted to EDS on       PASRR number received on 03/16/17     Existing PASRR number confirmed on       FL2 transmitted to all facilities in geographic area requested by pt/family on 03/16/17     FL2 transmitted to all facilities within larger geographic area on       Patient informed that his/her managed care company has contracts with or will negotiate with certain facilities, including the following:        Yes   Patient/family informed of bed offers received.  Patient chooses bed at [facility]     Physician recommends and patient chooses bed at      Patient to be transferred to [facility] on 04/14/17.  Patient to be transferred to facility by [facility]     Patient family notified on 04/14/17 of transfer.  Name of family member notified:  Shanon Brow, son     PHYSICIAN       Additional Comment:     _______________________________________________ Alexander Mt, [place] 04/14/2017, 12:52 PM

## 2017-04-14 NOTE — Progress Notes (Signed)
Patient d/c instructions and prescriptions given to [facility].  Port deaccessed prior to d/c.  All belongings with patient at time of d/c.  Son Shanon Brow aware of d/c to [facility].  Report called to [facility] prior to d/c.

## 2017-04-14 NOTE — Care Management Important Message (Signed)
Important Message  Patient Details  Name: Denise Morton MRN: [id number] Date of Birth: 02/09/1950   Medicare Important Message Given:  Yes    Ella Bodo, RN 04/14/2017, 3:26 PM

## 2017-04-14 NOTE — Care Management Note (Signed)
Case Management Note  Patient Details  Name: Denise Morton MRN: [id number] Date of Birth: 23-Nov-1950  Subjective/Objective:  Pt is a 67 y.o. female admitted with weakness and gastritis found to have cerebellar and occipital massess s/p resection 2/25 of right cerebellar mass, pt also with mets to liver, lung and bone.  PTA, pt independent lives alone.                 Action/Plan: PT/OT recommending CIR, but will need SNF due to limited assistance at home.  CSW to follow to facilitate dc to SNF upon medical stability.     Expected Discharge Date:   Expected Discharge Plan:  Skilled Nursing Facility  In-House Referral:  Clinical Social Work  Discharge planning Services  CM Consult  Post Acute Care Choice:    Choice offered to:     DME Arranged:    DME Agency:     HH Arranged:    [place] Agency:     Status of Service:  Completed, signed off  If discussed at [employer], dates discussed:    Additional Comments:  03/21/17 J. Nilsa Macht, RN, BSN Pt with continued drainage from surgical site.  MD plans to take pt back to OR 3/6 for surgical revision.    04/14/17 J. Megann Easterwood, RN, BSN Pt medically stable for discharge today.  Plan dc to SNF today, per CSW arrangements.     Reinaldo Raddle, RN, BSN  Trauma/Neuro ICU Case Manager [phone]

## 2017-04-14 NOTE — Care Management Important Message (Signed)
Important Message  Patient Details  Name: Denise Morton MRN: [id number] Date of Birth: [birth year]   Medicare Important Message Given:  Yes    Ella Bodo, RN 04/14/2017, 3:26 PM

## 2017-04-14 NOTE — Social Work (Signed)
Clinical Social Worker facilitated patient discharge including contacting patient family and facility to confirm patient discharge plans.  Clinical information faxed to facility and family agreeable with plan.  CSW arranged ambulance transport via [facility] to [place].  RN to call  [phone] with report  prior to discharge.  Clinical Social Worker will sign off for now as social work intervention is no longer needed. Please consult [place] again if new need arises.  Alexander Mt, [place] Social Worker

## 2017-04-17 DIAGNOSIS — E1340 Other specified diabetes mellitus with diabetic neuropathy, unspecified: Secondary | ICD-10-CM | POA: Diagnosis not present

## 2017-04-17 DIAGNOSIS — E441 Mild protein-calorie malnutrition: Secondary | ICD-10-CM | POA: Diagnosis not present

## 2017-04-17 DIAGNOSIS — R52 Pain, unspecified: Secondary | ICD-10-CM | POA: Diagnosis not present

## 2017-04-17 DIAGNOSIS — R110 Nausea: Secondary | ICD-10-CM | POA: Diagnosis not present

## 2017-04-17 DIAGNOSIS — K5900 Constipation, unspecified: Secondary | ICD-10-CM | POA: Diagnosis not present

## 2017-04-17 DIAGNOSIS — M6281 Muscle weakness (generalized): Secondary | ICD-10-CM | POA: Diagnosis not present

## 2017-04-17 DIAGNOSIS — I10 Essential (primary) hypertension: Secondary | ICD-10-CM | POA: Diagnosis not present

## 2017-04-17 DIAGNOSIS — E569 Vitamin deficiency, unspecified: Secondary | ICD-10-CM | POA: Diagnosis not present

## 2017-04-17 DIAGNOSIS — C719 Malignant neoplasm of brain, unspecified: Secondary | ICD-10-CM | POA: Diagnosis not present

## 2017-04-17 DIAGNOSIS — E119 Type 2 diabetes mellitus without complications: Secondary | ICD-10-CM | POA: Diagnosis not present

## 2017-04-17 DIAGNOSIS — B379 Candidiasis, unspecified: Secondary | ICD-10-CM | POA: Diagnosis not present

## 2017-04-18 DIAGNOSIS — C719 Malignant neoplasm of brain, unspecified: Secondary | ICD-10-CM | POA: Diagnosis not present

## 2017-04-18 DIAGNOSIS — R05 Cough: Secondary | ICD-10-CM | POA: Diagnosis not present

## 2017-04-18 DIAGNOSIS — E119 Type 2 diabetes mellitus without complications: Secondary | ICD-10-CM | POA: Diagnosis not present

## 2017-04-18 DIAGNOSIS — R110 Nausea: Secondary | ICD-10-CM | POA: Diagnosis not present

## 2017-04-18 DIAGNOSIS — I10 Essential (primary) hypertension: Secondary | ICD-10-CM | POA: Diagnosis not present

## 2017-04-18 DIAGNOSIS — M6281 Muscle weakness (generalized): Secondary | ICD-10-CM | POA: Diagnosis not present

## 2017-04-18 DIAGNOSIS — R52 Pain, unspecified: Secondary | ICD-10-CM | POA: Diagnosis not present

## 2017-04-18 DIAGNOSIS — D649 Anemia, unspecified: Secondary | ICD-10-CM | POA: Diagnosis not present

## 2017-04-18 DIAGNOSIS — Z483 Aftercare following surgery for neoplasm: Secondary | ICD-10-CM | POA: Diagnosis not present

## 2017-04-18 DIAGNOSIS — K5900 Constipation, unspecified: Secondary | ICD-10-CM | POA: Diagnosis not present

## 2017-04-18 DIAGNOSIS — E1340 Other specified diabetes mellitus with diabetic neuropathy, unspecified: Secondary | ICD-10-CM | POA: Diagnosis not present

## 2017-04-18 DIAGNOSIS — E441 Mild protein-calorie malnutrition: Secondary | ICD-10-CM | POA: Diagnosis not present

## 2017-04-20 DIAGNOSIS — M6281 Muscle weakness (generalized): Secondary | ICD-10-CM | POA: Diagnosis not present

## 2017-04-20 DIAGNOSIS — E1340 Other specified diabetes mellitus with diabetic neuropathy, unspecified: Secondary | ICD-10-CM | POA: Diagnosis not present

## 2017-04-20 DIAGNOSIS — K5900 Constipation, unspecified: Secondary | ICD-10-CM | POA: Diagnosis not present

## 2017-04-20 DIAGNOSIS — E119 Type 2 diabetes mellitus without complications: Secondary | ICD-10-CM | POA: Diagnosis not present

## 2017-04-20 DIAGNOSIS — D649 Anemia, unspecified: Secondary | ICD-10-CM | POA: Diagnosis not present

## 2017-04-20 DIAGNOSIS — I10 Essential (primary) hypertension: Secondary | ICD-10-CM | POA: Diagnosis not present

## 2017-04-20 DIAGNOSIS — R05 Cough: Secondary | ICD-10-CM | POA: Diagnosis not present

## 2017-04-20 DIAGNOSIS — R110 Nausea: Secondary | ICD-10-CM | POA: Diagnosis not present

## 2017-04-20 DIAGNOSIS — R52 Pain, unspecified: Secondary | ICD-10-CM | POA: Diagnosis not present

## 2017-04-20 DIAGNOSIS — Z483 Aftercare following surgery for neoplasm: Secondary | ICD-10-CM | POA: Diagnosis not present

## 2017-04-20 DIAGNOSIS — C719 Malignant neoplasm of brain, unspecified: Secondary | ICD-10-CM | POA: Diagnosis not present

## 2017-04-20 DIAGNOSIS — E441 Mild protein-calorie malnutrition: Secondary | ICD-10-CM | POA: Diagnosis not present

## 2017-04-20 NOTE — Progress Notes (Signed)
Location/Histology of Brain Tumor: Right cerebellum, primary breast.  2016 Metastatic HER2 breast cancer- mets to the bones, liver, on herceptin for treatment.  Pathology 03/13/2017 Brain   Patient presented with symptoms of: Weakness, dizziness, anorexia, speech difficulties, and near syncope.   MRI 03/07/2017 done in the ED showed 6.4 cm mass in the superior right cerebellum and surrounding edema, compressing on the fourth ventricle, 2 mm focus enhancement in the Right occipital lobe.  Consult for Neurosurgery Dr. Saintclair Halsted.  CT Scan 04/04/2017:  Diminishing amount bilateral subdural fluid and air. Slight reduction in size of the fluid collection and packing material at the craniotomy site.  Similar pattern cerebellar edema with mass-effect upon the fourth ventricle postoperatively as expected. Residual punctate calcifications presumed to be associated with residual tumor tissue.   Past or anticipated interventions, if any, per neurosurgery:  03/13/2017 suboccipital craniectomy for resection of right cerebellar metastatic cancer.  Surgical repair of CSF leak 3/6, ventriculostomy placed on 3/12.   Past or anticipated interventions, if any, per medical oncology:  Dr. Lindi Adie 04/14/2017 Plan: I would like to add lapatinib to whole brain radiation therapy. After the completion of radiation, she will go on lapatinib Herceptin. I briefly discussed with her the lapatinib side effects which include diarrhea and rash.  Patient has an appointment to see radiation oncology on 05/02/2017.  I will arrange a follow-up on the same day to see me as well.   Dose of Decadron, if applicable: 50m Q6  Recent neurologic symptoms, if any:   Seizures: No  Headaches: No  Nausea: No  Dizziness/ataxia: No   Difficulty with hand coordination: Fine motor movements off.  Focal numbness/weakness: No, weakness in hands and legs  Visual deficits/changes: Double vision in the right eye.  Confusion/Memory  deficits: Short and long term memory loss  Painful bone metastases at present, if any:   BP (!) 159/95 (BP Location: Right Arm, Patient Position: Sitting, Cuff Size: Normal)   Pulse 63   Resp 18   Ht 5' 6"  (1.676 m)   Wt 197 lb 14.4 oz (89.8 kg)   SpO2 91%   BMI 31.94 kg/m    Wt Readings from Last 3 Encounters:  05/02/17 197 lb 14.4 oz (89.8 kg)  04/14/17 213 lb 10 oz (96.9 kg)  02/08/17 198 lb 8 oz (90 kg)    SAFETY ISSUES:  Prior radiation? No  Pacemaker/ICD? No  Possible current pregnancy? No, tubal Ligation  Is the patient on methotrexate? No   Additional Complaints / other details: Treated with Chemo (herceptin perjeta, taxol weekly added 09/10/2014) 09/03/2014-11/26/2014 for cancer in the upper outer quadrant of the right breast.

## 2017-04-24 DIAGNOSIS — E441 Mild protein-calorie malnutrition: Secondary | ICD-10-CM | POA: Diagnosis not present

## 2017-04-24 DIAGNOSIS — Z853 Personal history of malignant neoplasm of breast: Secondary | ICD-10-CM | POA: Diagnosis not present

## 2017-04-24 DIAGNOSIS — E119 Type 2 diabetes mellitus without complications: Secondary | ICD-10-CM | POA: Diagnosis not present

## 2017-04-24 DIAGNOSIS — I10 Essential (primary) hypertension: Secondary | ICD-10-CM | POA: Diagnosis not present

## 2017-04-24 DIAGNOSIS — C719 Malignant neoplasm of brain, unspecified: Secondary | ICD-10-CM | POA: Diagnosis not present

## 2017-04-24 DIAGNOSIS — Z483 Aftercare following surgery for neoplasm: Secondary | ICD-10-CM | POA: Diagnosis not present

## 2017-04-24 DIAGNOSIS — R52 Pain, unspecified: Secondary | ICD-10-CM | POA: Diagnosis not present

## 2017-04-24 DIAGNOSIS — K5900 Constipation, unspecified: Secondary | ICD-10-CM | POA: Diagnosis not present

## 2017-04-24 DIAGNOSIS — E1340 Other specified diabetes mellitus with diabetic neuropathy, unspecified: Secondary | ICD-10-CM | POA: Diagnosis not present

## 2017-04-24 DIAGNOSIS — D649 Anemia, unspecified: Secondary | ICD-10-CM | POA: Diagnosis not present

## 2017-04-24 DIAGNOSIS — R05 Cough: Secondary | ICD-10-CM | POA: Diagnosis not present

## 2017-04-24 DIAGNOSIS — M6281 Muscle weakness (generalized): Secondary | ICD-10-CM | POA: Diagnosis not present

## 2017-04-25 DIAGNOSIS — Z483 Aftercare following surgery for neoplasm: Secondary | ICD-10-CM | POA: Diagnosis not present

## 2017-04-25 DIAGNOSIS — M6281 Muscle weakness (generalized): Secondary | ICD-10-CM | POA: Diagnosis not present

## 2017-04-25 DIAGNOSIS — C719 Malignant neoplasm of brain, unspecified: Secondary | ICD-10-CM | POA: Diagnosis not present

## 2017-04-25 DIAGNOSIS — Z853 Personal history of malignant neoplasm of breast: Secondary | ICD-10-CM | POA: Diagnosis not present

## 2017-04-26 ENCOUNTER — Other Ambulatory Visit: Payer: Self-pay | Admitting: Licensed Clinical Social Worker

## 2017-04-26 NOTE — Patient Outreach (Signed)
[facility]) Care Management  04/26/2017  Analyse Angst 10-Sep-1950 [id number]  [facility] CSW received referral on 04/25/17 from [place] as this [facility] CSW covers all patients at [facility]. CSW arrived at [facility] and met with patient. [facility] CSW introduced self, reason for visit and of [place]. Patient shares that she has been at [facility] since 04/14/17. Patient is unsure she will be staying at facility long term or will be returning home. [facility] CSW explained [facility] Care Management Services and patient is hesitant to sign consent form yet since no discharge plan has been made. Patient reports that her son is her 36. Patient reports that she had a care team planning meeting yesterday at facility and was informed that she was not safe to return back home. Patient is agreeable to another SNF visit in two-three weeks. [facility] CSW will reintroduce services to patient once a discharge plan has been decided.   Eula Fried, BSW, MSW, [email or web address] Phone: [phone] Fax: [phone]

## 2017-05-01 ENCOUNTER — Telehealth: Payer: Self-pay

## 2017-05-01 DIAGNOSIS — R52 Pain, unspecified: Secondary | ICD-10-CM | POA: Diagnosis not present

## 2017-05-01 DIAGNOSIS — Z483 Aftercare following surgery for neoplasm: Secondary | ICD-10-CM | POA: Diagnosis not present

## 2017-05-01 DIAGNOSIS — D649 Anemia, unspecified: Secondary | ICD-10-CM | POA: Diagnosis not present

## 2017-05-01 DIAGNOSIS — Z853 Personal history of malignant neoplasm of breast: Secondary | ICD-10-CM | POA: Diagnosis not present

## 2017-05-01 DIAGNOSIS — E1340 Other specified diabetes mellitus with diabetic neuropathy, unspecified: Secondary | ICD-10-CM | POA: Diagnosis not present

## 2017-05-01 DIAGNOSIS — I10 Essential (primary) hypertension: Secondary | ICD-10-CM | POA: Diagnosis not present

## 2017-05-01 DIAGNOSIS — R05 Cough: Secondary | ICD-10-CM | POA: Diagnosis not present

## 2017-05-01 DIAGNOSIS — E119 Type 2 diabetes mellitus without complications: Secondary | ICD-10-CM | POA: Diagnosis not present

## 2017-05-01 DIAGNOSIS — K5900 Constipation, unspecified: Secondary | ICD-10-CM | POA: Diagnosis not present

## 2017-05-01 DIAGNOSIS — E441 Mild protein-calorie malnutrition: Secondary | ICD-10-CM | POA: Diagnosis not present

## 2017-05-01 DIAGNOSIS — C719 Malignant neoplasm of brain, unspecified: Secondary | ICD-10-CM | POA: Diagnosis not present

## 2017-05-01 DIAGNOSIS — M6281 Muscle weakness (generalized): Secondary | ICD-10-CM | POA: Diagnosis not present

## 2017-05-01 NOTE — Telephone Encounter (Signed)
Returned call to [employer], Retail buyer at [place] where Mrs. Lugar is a current resident. Informed Lattie Haw patients port is to be flushes q6weeks starting from discharge date of 03/29. Port is flushed with 10cc normal saline and 5cc heparin flush. No further questions.  Cyndia Bent RN

## 2017-05-02 ENCOUNTER — Ambulatory Visit
Admit: 2017-05-02 | Discharge: 2017-05-02 | Disposition: A | Discharge status: Home / Self Care | Payer: PPO | Attending: Radiation Oncology | Admitting: Radiation Oncology

## 2017-05-02 ENCOUNTER — Ambulatory Visit
Admission: RE | Admit: 2017-05-02 | Discharge: 2017-05-02 | Disposition: A | Discharge status: Home / Self Care | Payer: PPO | Source: Ambulatory Visit | Attending: Radiation Oncology | Admitting: Radiation Oncology

## 2017-05-02 ENCOUNTER — Encounter: Payer: Self-pay | Admitting: Radiation Oncology

## 2017-05-02 ENCOUNTER — Ambulatory Visit (HOSPITAL_BASED_OUTPATIENT_CLINIC_OR_DEPARTMENT_OTHER): Payer: PPO | Admitting: Hematology and Oncology

## 2017-05-02 ENCOUNTER — Other Ambulatory Visit: Payer: Self-pay | Admitting: Radiation Therapy

## 2017-05-02 ENCOUNTER — Telehealth: Payer: Self-pay | Admitting: Hematology and Oncology

## 2017-05-02 ENCOUNTER — Other Ambulatory Visit: Payer: Self-pay

## 2017-05-02 VITALS — BP 159/95 | HR 63 | Resp 18 | Ht 66.0 in | Wt 197.9 lb

## 2017-05-02 DIAGNOSIS — C7931 Secondary malignant neoplasm of brain: Secondary | ICD-10-CM

## 2017-05-02 DIAGNOSIS — C50911 Malignant neoplasm of unspecified site of right female breast: Secondary | ICD-10-CM

## 2017-05-02 DIAGNOSIS — C7800 Secondary malignant neoplasm of unspecified lung: Secondary | ICD-10-CM | POA: Insufficient documentation

## 2017-05-02 DIAGNOSIS — Z794 Long term (current) use of insulin: Secondary | ICD-10-CM | POA: Insufficient documentation

## 2017-05-02 DIAGNOSIS — Z853 Personal history of malignant neoplasm of breast: Secondary | ICD-10-CM | POA: Diagnosis not present

## 2017-05-02 DIAGNOSIS — Z923 Personal history of irradiation: Secondary | ICD-10-CM | POA: Insufficient documentation

## 2017-05-02 DIAGNOSIS — Z79899 Other long term (current) drug therapy: Secondary | ICD-10-CM | POA: Insufficient documentation

## 2017-05-02 DIAGNOSIS — Z87891 Personal history of nicotine dependence: Secondary | ICD-10-CM | POA: Diagnosis not present

## 2017-05-02 DIAGNOSIS — C50411 Malignant neoplasm of upper-outer quadrant of right female breast: Secondary | ICD-10-CM | POA: Diagnosis not present

## 2017-05-02 DIAGNOSIS — Z170 Estrogen receptor positive status [ER+]: Secondary | ICD-10-CM | POA: Diagnosis not present

## 2017-05-02 DIAGNOSIS — C7951 Secondary malignant neoplasm of bone: Secondary | ICD-10-CM | POA: Diagnosis not present

## 2017-05-02 DIAGNOSIS — C7949 Secondary malignant neoplasm of other parts of nervous system: Principal | ICD-10-CM

## 2017-05-02 DIAGNOSIS — Z9089 Acquired absence of other organs: Secondary | ICD-10-CM | POA: Diagnosis not present

## 2017-05-02 DIAGNOSIS — C787 Secondary malignant neoplasm of liver and intrahepatic bile duct: Secondary | ICD-10-CM | POA: Insufficient documentation

## 2017-05-02 DIAGNOSIS — E1165 Type 2 diabetes mellitus with hyperglycemia: Secondary | ICD-10-CM | POA: Insufficient documentation

## 2017-05-02 DIAGNOSIS — Z7952 Long term (current) use of systemic steroids: Secondary | ICD-10-CM | POA: Diagnosis not present

## 2017-05-02 MED ORDER — OMEPRAZOLE 20 MG PO CPDR: 20.0000 mg | DELAYED_RELEASE_CAPSULE | Freq: Every day | ORAL | 1 refills | Status: AC

## 2017-05-02 MED ORDER — LORAZEPAM 0.5 MG PO TABS: ORAL_TABLET | ORAL | 0 refills | Status: AC

## 2017-05-02 NOTE — Telephone Encounter (Signed)
Gave patient AVs and calendar of upcoming June appointments. °

## 2017-05-02 NOTE — Progress Notes (Addendum)
Name: Denise Morton                MRN: [id number]  Date of Service: 03/08/17 DOB: [birth year]  HG:[id number], Jori Moll, MD  No ref. provider found     REFERRING PHYSICIAN: No ref. provider found   DIAGNOSIS: The primary encounter diagnosis was Slurred speech. Diagnoses of Vasogenic edema ([place]) and Near syncope were also pertinent to this visit.   HISTORY OF PRESENT ILLNESS: Denise Morton is a 67 y.o. female with a history of metastatic HER2 breast cancer. The patient presented in 2016 with metastatic breast cancer to the bones, liver, and has been on herceptin for treatment. She presented to the ED in February 2019 for slurred speech and progressive headaches with nausea and vomiting that had been going on for about 2 months. She had GI evaluation as a well that revealed gastritis. She had an MRI ordered when she was triaged to the ED which revealed a 6.4 cm mass in the superior right cerebellum and surrounding edema is noted and the tumor compresses against the fourth ventricle and a 2 mm focus of enhancement in the right occipital lobe. She was seen during her hospitalization to discuss options of radiotherapy, and subsequently was taken to the OR on 03/13/17 for raniectomy and resection of her cerebellar mass. Unfortunately she had a delayed recovery including CSF leak on two occasions. She had a repair on 03/22/17, and additional revision on 03/28/17. She has been recovering in a skilled facility, and her most recent MRI was postoperatively on 03/16/17. She comes today to discuss postoperative radiotherapy.    PREVIOUS RADIATION THERAPY: No   PAST MEDICAL HISTORY:      Past Medical History:  Diagnosis Date  . CRVO (central retinal vein occlusion)   . Diabetes mellitus without complication ([place])   . Metastatic cancer ([place]) dx'd 07/2014   liver, lung and bone  . right breast ca dx'd 07/2014       PAST SURGICAL HISTORY:      Past Surgical History:  Procedure Laterality Date  .  TONSILLECTOMY    . TUBAL LIGATION    . TUMOR REMOVAL     benign- 2 on leg, 1 in finger     FAMILY HISTORY:       Family History  Problem Relation Age of Onset  . Heart failure Father      SOCIAL HISTORY:  reports that she has quit smoking. She has a 15.00 pack-year smoking history. she has never used smokeless tobacco. She reports that she does not drink alcohol or use drugs. The patient is single and lived in [place] independently prior to her hospitalization. Her Son Shanon Brow is her HCPO and her second son also accompanies her.    MEDICATIONS: Current Outpatient Medications  Medication Sig Dispense Refill  . amLODipine (NORVASC) 10 MG tablet Take 1 tablet (10 mg total) by mouth daily. 30 tablet 0  . bacitracin ointment Apply topically as needed for wound care. 120 g 0  . bisacodyl (DULCOLAX) 10 MG suppository Place 1 suppository (10 mg total) rectally daily as needed for moderate constipation. 12 suppository 0  . bisacodyl (DULCOLAX) 5 MG EC tablet Take 2 tablets (10 mg total) by mouth daily as needed for moderate constipation. 30 tablet 0  . bisacodyl (DULCOLAX) 5 MG EC tablet Take 1 tablet (5 mg total) by mouth daily as needed for moderate constipation. 30 tablet 1  . carvedilol (COREG) 25 MG tablet Take 1 tablet (25 mg total) by mouth  2 (two) times daily with a meal. 60 tablet 0  . dexamethasone (DECADRON) 2 MG tablet Take 1 tablet (2 mg total) by mouth every 12 (twelve) hours. 60 tablet 0  . docusate sodium (COLACE) 100 MG capsule Take 1 capsule (100 mg total) by mouth 2 (two) times daily. 10 capsule 0  . docusate sodium (COLACE) 100 MG capsule Take 1 capsule (100 mg total) by mouth 2 (two) times daily. 10 capsule 0  . ferrous sulfate 325 (65 FE) MG tablet Take 1 tablet (325 mg total) by mouth 2 (two) times daily with a meal. 30 tablet 3  . gabapentin (NEURONTIN) 300 MG capsule Take 1 capsule (300 mg total) by mouth 3 (three) times daily. 90 capsule 6  . insulin  aspart (NOVOLOG) 100 UNIT/ML injection Inject 4 Units into the skin 3 (three) times daily with meals. 10 mL 11  . insulin aspart (NOVOLOG) 100 UNIT/ML injection Inject 6 Units into the skin 3 (three) times daily with meals. 10 mL 11  . insulin glargine (LANTUS) 100 UNIT/ML injection Inject 0.2 mLs (20 Units total) into the skin at bedtime. 10 mL 11  . insulin glargine (LANTUS) 100 UNIT/ML injection Inject 0.24 mLs (24 Units total) into the skin at bedtime. 10 mL 11  . labetalol (NORMODYNE) 200 MG tablet Take 1 tablet (200 mg total) by mouth 3 (three) times daily. 90 tablet 1  . lidocaine-prilocaine (EMLA) cream APPLY TO THE AFFECTED AREA ONCE AS DIRECTED 30 g 0  . lisinopril (PRINIVIL,ZESTRIL) 5 MG tablet Take 1 tablet (5 mg total) by mouth daily. 30 tablet 1  . mirtazapine (REMERON SOL-TAB) 15 MG disintegrating tablet Take 1 tablet (15 mg total) by mouth at bedtime. 30 tablet 0  . Multiple Vitamin (MULTIVITAMIN WITH MINERALS) TABS tablet Take 1 tablet by mouth daily.    Marland Kitchen nystatin-triamcinolone (MYCOLOG II) cream Apply topically 2 (two) times daily. 30 g 0  . ondansetron (ZOFRAN) 4 MG tablet Take 1 tablet (4 mg total) by mouth every 4 (four) hours as needed for nausea or vomiting. 20 tablet 0  . ondansetron (ZOFRAN) 8 MG tablet Take 1 tablet (8 mg total) by mouth every 8 (eight) hours as needed for nausea. 30 tablet 3  . pantoprazole (PROTONIX) 40 MG tablet Take 1 tablet (40 mg total) by mouth at bedtime. 30 tablet 1  . polyethylene glycol (MIRALAX / GLYCOLAX) packet Take 17 g by mouth daily. 14 each 0  . promethazine (PHENERGAN) 12.5 MG tablet Take 1-2 tablets (12.5-25 mg total) by mouth every 4 (four) hours as needed for refractory nausea / vomiting. 30 tablet 0  . senna (SENOKOT) 8.6 MG TABS tablet Take 1 tablet (8.6 mg total) by mouth 2 (two) times daily. 120 each 0  . dexamethasone (DECADRON) 4 MG tablet Take 1 tablet (4 mg total) by mouth every 8 (eight) hours. (Patient not taking: Reported on  05/02/2017) 60 tablet 0  . doxycycline (VIBRA-TABS) 100 MG tablet Take 1 tablet (100 mg total) by mouth every 12 (twelve) hours. (Patient not taking: Reported on 05/02/2017) 14 tablet 0  . omeprazole (PRILOSEC) 20 MG capsule Take 1 capsule (20 mg total) by mouth daily. 30 capsule 1   No current facility-administered medications for this encounter.    Facility-Administered Medications Ordered in Other Encounters  Medication Dose Route Frequency Provider Last Rate Last Dose  . sodium chloride 0.9 % injection 10 mL  10 mL Intracatheter PRN Nicholas Lose, MD   10 mL at 02/18/15 1111  ALLERGIES:  Allergies  Allergen Reactions  . Penicillin G Rash    Has patient had a PCN reaction causing immediate rash, facial/tongue/throat swelling, SOB or lightheadedness with hypotension: Unknown Has patient had a PCN reaction causing severe rash involving mucus membranes or skin necrosis: Unknown Has patient had a PCN reaction that required hospitalization: Unknown Has patient had a PCN reaction occurring within the last 10 years: Unknown If all of the above answers are "NO", then may proceed with Cephalosporin use.   . Sulfa Antibiotics Rash     REVIEW OF SYSTEMS: On review of systems, the patient reports that she is doing okay but her postoperative recovery has been slow. She does have difficulty with recall of time and information processing. She denies any chest pain, shortness of breath, cough, fevers, chills, night sweats. She denies any bowel or bladder disturbances, and denies abdominal pain, nausea or vomiting. She denies any new musculoskeletal or joint aches or pains, new skin lesions or concerns. A complete review of systems is obtained and is otherwise negative.     PHYSICAL EXAM:     Wt Readings from Last 3 Encounters:  03/08/17 221 lb 12.5 oz (100.6 kg)  02/08/17 198 lb 8 oz (90 kg)  01/11/17 227 lb 1.6 oz (103 kg)      Temp Readings from Last 3 Encounters:  03/08/17 97.6 F  (36.4 C) (Oral)  02/08/17 (!) 95.7 F (35.4 C) (Oral)  01/11/17 97.7 F (36.5 C) (Oral)      BP Readings from Last 3 Encounters:  03/08/17 (!) 158/76  02/08/17 109/86  01/11/17 134/79      Pulse Readings from Last 3 Encounters:  03/08/17 68  02/08/17 69  01/11/17 62   Pain Assessment Pain Score: 0-No pain/10  In general this is a chronically ill appearing Caucasian female in no acute distress. She is alert and oriented to place and self. She does not recall today's date and myself. She otherwise is appropriate throughout the examination. HEENT reveals that the patient is normocephalic, atraumatic, with a well healed posterior midline scalp incision. She has tape over her right eye glass due to blurry vision. Skin is intact without any evidence of gross lesions. Cardiopulmonary assessment is negative for acute distress and she exhibits normal effort.    ECOG = 4  0 - Asymptomatic (Fully active, able to carry on all predisease activities without restriction)  1 - Symptomatic but completely ambulatory (Restricted in physically strenuous activity but ambulatory and able to carry out work of a light or sedentary nature. For example, light housework, office work)  2 - Symptomatic, <50% in bed during the day (Ambulatory and capable of all self care but unable to carry out any work activities. Up and about more than 50% of waking hours)  3 - Symptomatic, >50% in bed, but not bedbound (Capable of only limited self-care, confined to bed or chair 50% or more of waking hours)  4 - Bedbound (Completely disabled. Cannot carry on any self-care. Totally confined to bed or chair)  5 - Death   Eustace Pen MM, Creech RH, Tormey DC, et al. [phone]). "Toxicity and response criteria of the [facility]". [place] Oncol. 5 (6): 649-55      IMPRESSION/PLAN: 1.         Progressive Stage IV HER2 positive breast cancer with metastatic disease to the cerebellum  with right occipital lesion. Dr. Lisbeth Renshaw reviews her course and prior therapies. He discusses that he would like  to repeat a 3T MRI to proceed with treatment planning. He outlines the rationale for whole brain radiotherapy versus conventional treatment to the posterior fossa and SRS treatment to the right occipital lesion. An MRI will also outline if there was additional disease to target. We discussed the risks, benefits, short, and long term effects of radiotherapy, and the patient is interested in proceeding with the appropriate approach for therapy at the appropriate time. She will return for simulation following her MRI. She will also meet back with Dr. Lindi Adie today to discuss systemic therapy as well.  2. GI prophylaxis. The patient will begin PPI therapy. Unfortunately that didn't continue when she discharged to the rehab facility given the long course and high dose of steroids. A new prescription was sent with her to the facility to start prilosec daily.  3. Long term use of steroids and fluctuations in blood sugars secondary to steroids. We will refer her to endocrinology for evaluation of blood sugars and given that she's been on steroids at high doses since February, she's at high risk of adrenal insufficency/crisis with taper.  4. Cognitive concerns. Her sons have concerns about her recall of information and orientation to time and person. If her symptoms do not improve in the interval per Dr. Saintclair Halsted that he feels is acceptable, we could consider referral to neuro oncology with Dr. Mickeal Skinner.     Carola Rhine, PAC

## 2017-05-02 NOTE — Assessment & Plan Note (Signed)
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 In addition to Right breast, hypermetabolic disease in Rt Supraclav LN, axilla and mediastinum, bulky disease in liver, Pulm Mets, Path fracture 9th rib   Treatment summary:Taxol weekly(started 09/10/2014 )Herceptin Perjeta every 3 weeks(started 09/03/2014) Received 10 Weeks Taxol, Herceptin Perjeta given once every 3 weeks (8/17 and 9/7 and 9/28, 10/19) ; changed chemo from Taxol to Abraxane for bronchospasm 11/19/14 and completed 12/03/14   03/07/2017: Heterogeneous mass in the right superior cerebellum 6.4 cm with associated edema, 3 mm enhancing focus right occipital lobe  03/13/2017: Brain resection right cerebellar: Metastatic carcinoma breast primary ER 0%, PR 0%, HER-2 positive ratio 7.89  Treatment plan:  1.  Radiation therapy to the brain  2. if her performance status improved, we will give her oral lapatinib  I gave orders to taper off and discontinue dexamethasone.  Return to clinic in 2 months to assess the patient's performance status and determine the treatment plan.

## 2017-05-02 NOTE — Progress Notes (Signed)
Patient Care Team: Seward Carol, MD as PCP - General (Internal Medicine) [place], Maree Erie, LCSW as [facility] Greg Cutter, LCSW as [place] [facility] (Licensed Clinical Social Worker)  DIAGNOSIS:  Encounter Diagnosis  Name Primary?  . Malignant neoplasm of upper-outer quadrant of right female breast, unspecified estrogen receptor status ([place])     SUMMARY OF ONCOLOGIC HISTORY:   Breast cancer of upper-outer quadrant of right female breast ([place])   08/15/2014 Initial Diagnosis    Right Breast Inflammatory invasive ductal cancer: 17 cm by ultrasound along with 13 cm ulceration, node also positive T4N1 (stage 3C) ER 0%, PR 0%, Her-2 Pos      08/22/2014 PET scan    Metastatic disease. In addition to Right breast, hypermetabolic disease in Rt Supraclav LN, axilla and mediastinum, bulky disease in liver, Pulm Mets, Path fracture 9th rib      09/03/2014 - 11/26/2014 Chemotherapy    Herceptin Perjeta started 09/03/2014, Taxol weekly added 09/10/2014      10/17/2014 Imaging    CT scans: Decrease in right axillary lymph node, decreased skin thickening of the right breast, decreased in bilateral pulmonary nodules, decrease in liver metastases      12/09/2014 PET scan    Remarkable response to chemotherapy resolution of liver metastases, marked improvement in the tumor in the breast, marked improvement in right axillary lymph nodes, resolution of lung nodules, improvement in left sixth rib      12/17/2014 -  Chemotherapy    Herceptin and Perjeta maintenance every 3 weeks, switched to every 4 weeks April 2018, switched to Herceptin alone 10/19/2016 every 4 weeks      03/07/2017 Relapse/Recurrence    Right superior cerebellar lesion 6.4 cm, additional 3 mm right occipital lobe lesion Resection 03/14/2017 metastatic breast cancer ER 0%, PR 0%, HER-2 positive ratio 7.89;        CHIEF COMPLIANT: Follow-up from the nursing home to  discuss her treatment plan  INTERVAL HISTORY: Denise Morton is a 67 year old who was in the hospital recently with large cerebellar lesion that was resected.  She had multiple postoperative complications and finally was able to be recovered and discharged to the nursing home.  She is brought in today by her 2 sons.  Apparently at the nursing home she is able to participate in physical therapy and is able to stand on her feet with the help of walker.  But she cannot get on her feet by herself.  She feels wiped out and tired after physical therapy session.  Lately her sugars have been a big problem.  She is planning to see endocrinology soon.  REVIEW OF SYSTEMS:   Constitutional: Denies fevers, chills or abnormal weight loss Eyes: Denies blurriness of vision Ears, nose, mouth, throat, and face: Denies mucositis or sore throat Respiratory: Denies cough, dyspnea or wheezes Cardiovascular: Denies palpitation, chest discomfort Gastrointestinal:  Denies nausea, heartburn or change in bowel habits Skin: Denies abnormal skin rashes Lymphatics: Denies new lymphadenopathy or easy bruising Neurological:Denies numbness, tingling or new weaknesses Behavioral/Psych: Mood is stable, no new changes  Extremities: No lower extremity edema  All other systems were reviewed with the patient and are negative.  I have reviewed the past medical history, past surgical history, social history and family history with the patient and they are unchanged from previous note.  ALLERGIES:  is allergic to penicillin g and sulfa antibiotics.  MEDICATIONS:  Current Outpatient Medications  Medication Sig Dispense Refill  . amLODipine (  NORVASC) 10 MG tablet Take 1 tablet (10 mg total) by mouth daily. 30 tablet 0  . bacitracin ointment Apply topically as needed for wound care. 120 g 0  . bisacodyl (DULCOLAX) 10 MG suppository Place 1 suppository (10 mg total) rectally daily as needed for moderate constipation. 12 suppository 0   . bisacodyl (DULCOLAX) 5 MG EC tablet Take 2 tablets (10 mg total) by mouth daily as needed for moderate constipation. 30 tablet 0  . bisacodyl (DULCOLAX) 5 MG EC tablet Take 1 tablet (5 mg total) by mouth daily as needed for moderate constipation. 30 tablet 1  . carvedilol (COREG) 25 MG tablet Take 1 tablet (25 mg total) by mouth 2 (two) times daily with a meal. 60 tablet 0  . dexamethasone (DECADRON) 2 MG tablet Take 1 tablet (2 mg total) by mouth every 12 (twelve) hours. 60 tablet 0  . dexamethasone (DECADRON) 4 MG tablet Take 1 tablet (4 mg total) by mouth every 8 (eight) hours. (Patient not taking: Reported on 05/02/2017) 60 tablet 0  . docusate sodium (COLACE) 100 MG capsule Take 1 capsule (100 mg total) by mouth 2 (two) times daily. 10 capsule 0  . docusate sodium (COLACE) 100 MG capsule Take 1 capsule (100 mg total) by mouth 2 (two) times daily. 10 capsule 0  . doxycycline (VIBRA-TABS) 100 MG tablet Take 1 tablet (100 mg total) by mouth every 12 (twelve) hours. (Patient not taking: Reported on 05/02/2017) 14 tablet 0  . ferrous sulfate 325 (65 FE) MG tablet Take 1 tablet (325 mg total) by mouth 2 (two) times daily with a meal. 30 tablet 3  . gabapentin (NEURONTIN) 300 MG capsule Take 1 capsule (300 mg total) by mouth 3 (three) times daily. 90 capsule 6  . insulin aspart (NOVOLOG) 100 UNIT/ML injection Inject 4 Units into the skin 3 (three) times daily with meals. 10 mL 11  . insulin aspart (NOVOLOG) 100 UNIT/ML injection Inject 6 Units into the skin 3 (three) times daily with meals. 10 mL 11  . insulin glargine (LANTUS) 100 UNIT/ML injection Inject 0.2 mLs (20 Units total) into the skin at bedtime. 10 mL 11  . insulin glargine (LANTUS) 100 UNIT/ML injection Inject 0.24 mLs (24 Units total) into the skin at bedtime. 10 mL 11  . labetalol (NORMODYNE) 200 MG tablet Take 1 tablet (200 mg total) by mouth 3 (three) times daily. 90 tablet 1  . lidocaine-prilocaine (EMLA) cream APPLY TO THE AFFECTED  AREA ONCE AS DIRECTED 30 g 0  . lisinopril (PRINIVIL,ZESTRIL) 5 MG tablet Take 1 tablet (5 mg total) by mouth daily. 30 tablet 1  . LORazepam (ATIVAN) 0.5 MG tablet 1 tablet po 30 minutes prior to radiation or MRI 30 tablet 0  . mirtazapine (REMERON SOL-TAB) 15 MG disintegrating tablet Take 1 tablet (15 mg total) by mouth at bedtime. 30 tablet 0  . Multiple Vitamin (MULTIVITAMIN WITH MINERALS) TABS tablet Take 1 tablet by mouth daily.    Marland Kitchen nystatin-triamcinolone (MYCOLOG II) cream Apply topically 2 (two) times daily. 30 g 0  . omeprazole (PRILOSEC) 20 MG capsule Take 1 capsule (20 mg total) by mouth daily. 30 capsule 1  . ondansetron (ZOFRAN) 4 MG tablet Take 1 tablet (4 mg total) by mouth every 4 (four) hours as needed for nausea or vomiting. 20 tablet 0  . ondansetron (ZOFRAN) 8 MG tablet Take 1 tablet (8 mg total) by mouth every 8 (eight) hours as needed for nausea. 30 tablet 3  . pantoprazole (  PROTONIX) 40 MG tablet Take 1 tablet (40 mg total) by mouth at bedtime. 30 tablet 1  . polyethylene glycol (MIRALAX / GLYCOLAX) packet Take 17 g by mouth daily. 14 each 0  . promethazine (PHENERGAN) 12.5 MG tablet Take 1-2 tablets (12.5-25 mg total) by mouth every 4 (four) hours as needed for refractory nausea / vomiting. 30 tablet 0  . senna (SENOKOT) 8.6 MG TABS tablet Take 1 tablet (8.6 mg total) by mouth 2 (two) times daily. 120 each 0   No current facility-administered medications for this visit.    Facility-Administered Medications Ordered in Other Visits  Medication Dose Route Frequency Provider Last Rate Last Dose  . sodium chloride 0.9 % injection 10 mL  10 mL Intracatheter PRN Nicholas Lose, MD   10 mL at 02/18/15 1111    PHYSICAL EXAMINATION: ECOG PERFORMANCE STATUS: 1 - Symptomatic but completely ambulatory  Vitals:   05/02/17 1329  BP: (!) 127/96  Pulse: 72  Resp: 18  Temp: 98.2 F (36.8 C)  SpO2: 95%   Filed Weights    GENERAL:alert, no distress and comfortable SKIN: skin  color, texture, turgor are normal, no rashes or significant lesions EYES: normal, Conjunctiva are pink and non-injected, sclera clear OROPHARYNX:no exudate, no erythema and lips, buccal mucosa, and tongue normal  NECK: supple, thyroid normal size, non-tender, without nodularity LYMPH:  no palpable lymphadenopathy in the cervical, axillary or inguinal LUNGS: clear to auscultation and percussion with normal breathing effort HEART: regular rate & rhythm and no murmurs and no lower extremity edema ABDOMEN:abdomen soft, non-tender and normal bowel sounds MUSCULOSKELETAL:no cyanosis of digits and no clubbing  NEURO: alert & oriented x 3 with fluent speech, no focal motor/sensory deficits EXTREMITIES: No lower extremity edema  LABORATORY DATA:  I have reviewed the data as listed CMP Latest Ref Rng & Units 04/14/2017 04/13/2017 04/12/2017  Glucose 65 - 99 mg/dL 103(H) 162(H) 132(H)  BUN 6 - 20 mg/dL 38(H) 35(H) 38(H)  Creatinine 0.44 - 1.00 mg/dL 0.91 0.88 0.74  Sodium 135 - 145 mmol/L 139 142 140  Potassium 3.5 - 5.1 mmol/L 3.6 3.4(L) 3.6  Chloride 101 - 111 mmol/L 115(H) 117(H) 116(H)  CO2 22 - 32 mmol/L 18(L) 14(L) 16(L)  Calcium 8.9 - 10.3 mg/dL 8.5(L) 8.6(L) 8.4(L)  Total Protein 6.5 - 8.1 g/dL - - -  Total Bilirubin 0.3 - 1.2 mg/dL - - -  Alkaline Phos 38 - 126 U/L - - -  AST 15 - 41 U/L - - -  ALT 14 - 54 U/L - - -    Lab Results  Component Value Date   WBC 3.8 (L) 04/14/2017   HGB 9.3 (L) 04/14/2017   HCT 30.0 (L) 04/14/2017   MCV 104.5 (H) 04/14/2017   PLT 141 (L) 04/14/2017   NEUTROABS 13.0 (H) 03/22/2017    ASSESSMENT & PLAN:  Breast cancer of 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 In addition to Right breast, hypermetabolic disease in Rt  Supraclav LN, axilla and mediastinum, bulky disease in liver, Pulm Mets, Path fracture 9th rib   Treatment summary:Taxol weekly(started 09/10/2014 )Herceptin Perjeta every 3 weeks(started 09/03/2014) Received 10 Weeks Taxol, Herceptin Perjeta given once every 3 weeks (8/17 and 9/7 and 9/28, 10/19) ; changed chemo from Taxol to Abraxane for bronchospasm 11/19/14 and completed  12/03/14   03/07/2017: Heterogeneous mass in the right superior cerebellum 6.4 cm with associated edema, 3 mm enhancing focus right occipital lobe  03/13/2017: Brain resection right cerebellar: Metastatic carcinoma breast primary ER 0%, PR 0%, HER-2 positive ratio 7.89  Treatment plan:  1.  Radiation therapy to the brain  2. if her performance status improved, we will give her oral lapatinib  I gave orders to taper off and discontinue dexamethasone.  Return to clinic in 2 months to assess the patient's performance status and determine the treatment plan.  Patient and her sons both agreed to the plan.  No orders of the defined types were placed in this encounter.  The patient has a good understanding of the overall plan. she agrees with it. she will call with any problems that may develop before the next visit here.   Harriette Ohara, MD 05/02/17

## 2017-05-04 ENCOUNTER — Telehealth: Payer: Self-pay | Admitting: *Deleted

## 2017-05-04 DIAGNOSIS — R609 Edema, unspecified: Secondary | ICD-10-CM | POA: Diagnosis not present

## 2017-05-04 DIAGNOSIS — R4182 Altered mental status, unspecified: Secondary | ICD-10-CM | POA: Diagnosis not present

## 2017-05-04 DIAGNOSIS — R4701 Aphasia: Secondary | ICD-10-CM | POA: Diagnosis not present

## 2017-05-04 DIAGNOSIS — R531 Weakness: Secondary | ICD-10-CM | POA: Diagnosis not present

## 2017-05-04 DIAGNOSIS — C7931 Secondary malignant neoplasm of brain: Secondary | ICD-10-CM | POA: Diagnosis not present

## 2017-05-04 DIAGNOSIS — Z483 Aftercare following surgery for neoplasm: Secondary | ICD-10-CM | POA: Diagnosis not present

## 2017-05-04 DIAGNOSIS — C719 Malignant neoplasm of brain, unspecified: Secondary | ICD-10-CM | POA: Diagnosis not present

## 2017-05-04 NOTE — Telephone Encounter (Signed)
This RN received 2 calls on this patient - both from [facility] / [place] [facility].  1st call was from attending NP ( could not make out name ) requesting a return call to speak to MD due to noted pt decline. Return call number given as [phone].  Call returned by this RN and obtained VM but then " mail box is full and cannot take messages at this time "  Second call was from the Nurse Supervisor requesting a return call due to referral for endocrinology " we need to know who the endocrinologist is so we can proceed with referral "  Return call number given as [phone]- this number was called and obtained VM- message left to return call to this RN.  Noted referral was placed by Worthy Flank PA with Rad Onc.  This message will be sent to her.  During the typing of this message - call received from Gerilyn Nestle NP at [facility]. She stated concern due to noted severe decline in pt's status since last week and per her understanding " the doctor wants to proceed with radiation and chemo but the patient is declining and likely needs palliative care "  This RN reviewed Dr Geralyn Flash note stating plan is to proceed with radiation and then reassess for possible benefit with oral chemo. Informed NP Dr Lindi Adie is out of the office until 4/29 and offered to obtain a provider who could discuss pt's care further. Ms Monia Pouch declined stating " I appreciate your call but no I will speak with the HCPOA and discussed possible palliative care "   This note will be sent to providers in pt's care as well covering provider for Dr Lindi Adie

## 2017-05-04 NOTE — Telephone Encounter (Signed)
I spoke with the patient's son and nursing supervisor at the facility. Palliative care will see her, but we will keep her steroids at 2 mg BID for now. We will ask for her to see Dr. Mickeal Skinner as there have been concerns about progressive cognitive decline. She sees Dr. Saintclair Halsted next Tuesday, and is due for 3T MRI at [facility] on Wednesday. We will follow along and I've given the nurse manager my number for the palliative care team to reach out to better understand our plans for treatment.

## 2017-05-05 ENCOUNTER — Telehealth: Payer: Self-pay | Admitting: Hematology and Oncology

## 2017-05-05 ENCOUNTER — Telehealth: Payer: Self-pay | Admitting: Internal Medicine

## 2017-05-05 ENCOUNTER — Inpatient Hospital Stay: Payer: PPO | Attending: Internal Medicine | Admitting: Internal Medicine

## 2017-05-05 VITALS — BP 149/91 | HR 70 | Temp 97.9°F | Resp 16 | Ht 66.0 in

## 2017-05-05 DIAGNOSIS — Z171 Estrogen receptor negative status [ER-]: Secondary | ICD-10-CM | POA: Insufficient documentation

## 2017-05-05 DIAGNOSIS — C7931 Secondary malignant neoplasm of brain: Secondary | ICD-10-CM | POA: Insufficient documentation

## 2017-05-05 DIAGNOSIS — R4189 Other symptoms and signs involving cognitive functions and awareness: Secondary | ICD-10-CM | POA: Insufficient documentation

## 2017-05-05 DIAGNOSIS — C50411 Malignant neoplasm of upper-outer quadrant of right female breast: Secondary | ICD-10-CM | POA: Diagnosis not present

## 2017-05-05 DIAGNOSIS — C7800 Secondary malignant neoplasm of unspecified lung: Secondary | ICD-10-CM | POA: Insufficient documentation

## 2017-05-05 DIAGNOSIS — C787 Secondary malignant neoplasm of liver and intrahepatic bile duct: Secondary | ICD-10-CM | POA: Insufficient documentation

## 2017-05-05 NOTE — Progress Notes (Signed)
[facility] at [address] [place] [place], [place] [address] [phone]   New Patient Evaluation  Date of Service: 05/05/17 Patient Name: Denise Morton Patient MRN: [id number] Patient DOB: 1950/04/04 Provider: Ventura Sellers, MD  Identifying Statement:  Denise Morton is a 67 y.o. female with Brain metastases ([place]) [C79.31] who presents for initial consultation and evaluation regarding cancer associated neurologic deficits.    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 In addition to Right breast, hypermetabolic disease in Rt Supraclav LN, axilla and mediastinum, bulky disease in liver, Pulm Mets, Path fracture 9th rib      09/03/2014 - 11/26/2014 Chemotherapy    Herceptin Perjeta started 09/03/2014, Taxol weekly added 09/10/2014      10/17/2014 Imaging    CT scans: Decrease in right axillary lymph node, decreased skin thickening of the right breast, decreased in bilateral pulmonary nodules, decrease in liver metastases      12/09/2014 PET scan    Remarkable response to chemotherapy resolution of liver metastases, marked improvement in the tumor in the breast, marked improvement in right axillary lymph nodes, resolution of lung nodules, improvement in left sixth rib      12/17/2014 -  Chemotherapy    Herceptin and Perjeta maintenance every 3 weeks, switched to every 4 weeks April 2018, switched to Herceptin alone 10/19/2016 every 4 weeks      03/07/2017 Relapse/Recurrence    Right superior cerebellar lesion 6.4 cm, additional 3  mm right occipital lobe lesion Resection 03/14/2017 metastatic breast cancer ER 0%, PR 0%, HER-2 positive ratio 7.89;        History of Present Illness: The patient's records from the referring physician were obtained and reviewed and the patient interviewed to confirm this HPI.  Denise Morton presents today with her SNF transport aide to review her neurologic impairment and CNS disease burden from known metastatic breast cancer.  Initial discovery of the dominant 6cm right cerebellar met occurred in February after complaints of slurred speech and subjective  Imbalance.  The lesion was resected by Dr. Saintclair Halsted on 03/13/17, path c/w breast ca.  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  Since that time she has been recovering and rehabbing in an inpatient setting.  Per patient and aide she is unable to dress herself independently, is not toileting independently, and needs assistance to eat because of trouble manipulating utensils.  She also describes double vision "all the time". Because of significant cognitive and motor decline and lack of accompanying family, she had limited ability to provide history today.  The plan at this time is for [facility] planning MRI and potentially posterior fossa radiotherapy.  Medications: Current Outpatient Medications on File Prior to Visit  Medication Sig Dispense Refill  . amLODipine (NORVASC) 10 MG tablet Take 1 tablet (10 mg total) by mouth daily. 30 tablet 0  . bacitracin ointment Apply topically as needed for wound  care. 120 g 0  . bisacodyl (DULCOLAX) 10 MG suppository Place 1 suppository (10 mg total) rectally daily as needed for moderate constipation. 12 suppository 0  . bisacodyl (DULCOLAX) 5 MG EC tablet Take 2 tablets (10 mg total) by mouth daily as needed for moderate constipation. 30 tablet 0  . bisacodyl (DULCOLAX) 5 MG EC tablet  Take 1 tablet (5 mg total) by mouth daily as needed for moderate constipation. 30 tablet 1  . carvedilol (COREG) 25 MG tablet Take 1 tablet (25 mg total) by mouth 2 (two) times daily with a meal. 60 tablet 0  . dexamethasone (DECADRON) 2 MG tablet Take 1 tablet (2 mg total) by mouth every 12 (twelve) hours. 60 tablet 0  . dexamethasone (DECADRON) 4 MG tablet Take 1 tablet (4 mg total) by mouth every 8 (eight) hours. 60 tablet 0  . docusate sodium (COLACE) 100 MG capsule Take 1 capsule (100 mg total) by mouth 2 (two) times daily. 10 capsule 0  . docusate sodium (COLACE) 100 MG capsule Take 1 capsule (100 mg total) by mouth 2 (two) times daily. 10 capsule 0  . doxycycline (VIBRA-TABS) 100 MG tablet Take 1 tablet (100 mg total) by mouth every 12 (twelve) hours. 14 tablet 0  . ferrous sulfate 325 (65 FE) MG tablet Take 1 tablet (325 mg total) by mouth 2 (two) times daily with a meal. 30 tablet 3  . gabapentin (NEURONTIN) 300 MG capsule Take 1 capsule (300 mg total) by mouth 3 (three) times daily. 90 capsule 6  . insulin aspart (NOVOLOG) 100 UNIT/ML injection Inject 4 Units into the skin 3 (three) times daily with meals. 10 mL 11  . insulin aspart (NOVOLOG) 100 UNIT/ML injection Inject 6 Units into the skin 3 (three) times daily with meals. 10 mL 11  . insulin glargine (LANTUS) 100 UNIT/ML injection Inject 0.2 mLs (20 Units total) into the skin at bedtime. 10 mL 11  . insulin glargine (LANTUS) 100 UNIT/ML injection Inject 0.24 mLs (24 Units total) into the skin at bedtime. 10 mL 11  . labetalol (NORMODYNE) 200 MG tablet Take 1 tablet (200 mg total) by mouth 3 (three) times daily. 90 tablet 1  . lidocaine-prilocaine (EMLA) cream APPLY TO THE AFFECTED AREA ONCE AS DIRECTED 30 g 0  . lisinopril (PRINIVIL,ZESTRIL) 5 MG tablet Take 1 tablet (5 mg total) by mouth daily. 30 tablet 1  . LORazepam (ATIVAN) 0.5 MG tablet 1 tablet po 30 minutes prior to radiation or MRI 30 tablet 0  . mirtazapine (REMERON SOL-TAB)  15 MG disintegrating tablet Take 1 tablet (15 mg total) by mouth at bedtime. 30 tablet 0  . Multiple Vitamin (MULTIVITAMIN WITH MINERALS) TABS tablet Take 1 tablet by mouth daily.    Marland Kitchen nystatin-triamcinolone (MYCOLOG II) cream Apply topically 2 (two) times daily. 30 g 0  . omeprazole (PRILOSEC) 20 MG capsule Take 1 capsule (20 mg total) by mouth daily. 30 capsule 1  . ondansetron (ZOFRAN) 4 MG tablet Take 1 tablet (4 mg total) by mouth every 4 (four) hours as needed for nausea or vomiting. 20 tablet 0  . ondansetron (ZOFRAN) 8 MG tablet Take 1 tablet (8 mg total) by mouth every 8 (eight) hours as needed for nausea. 30 tablet 3  . pantoprazole (PROTONIX) 40 MG tablet Take 1 tablet (40 mg total) by mouth at bedtime. 30 tablet 1  . polyethylene glycol (MIRALAX / GLYCOLAX) packet Take 17 g by mouth daily. 14 each 0  . promethazine (PHENERGAN)  12.5 MG tablet Take 1-2 tablets (12.5-25 mg total) by mouth every 4 (four) hours as needed for refractory nausea / vomiting. 30 tablet 0  . senna (SENOKOT) 8.6 MG TABS tablet Take 1 tablet (8.6 mg total) by mouth 2 (two) times daily. 120 each 0   Current Facility-Administered Medications on File Prior to Visit  Medication Dose Route Frequency Provider Last Rate Last Dose  . sodium chloride 0.9 % injection 10 mL  10 mL Intracatheter PRN Nicholas Lose, MD   10 mL at 02/18/15 1111    Allergies:  Allergies  Allergen Reactions  . Penicillin G Rash    Has patient had a PCN reaction causing immediate rash, facial/tongue/throat swelling, SOB or lightheadedness with hypotension: Unknown Has patient had a PCN reaction causing severe rash involving mucus membranes or skin necrosis: Unknown Has patient had a PCN reaction that required hospitalization: Unknown Has patient had a PCN reaction occurring within the last 10 years: Unknown If all of the above answers are "NO", then may proceed with Cephalosporin use.   . Sulfa Antibiotics Rash   Past Medical History:    Past Medical History:  Diagnosis Date  . CRVO (central retinal vein occlusion)   . Diabetes mellitus without complication ([place])   . Metastatic cancer ([place]) dx'd 07/2014   liver, lung and bone  . right breast ca dx'd 07/2014   Past Surgical History:  Past Surgical History:  Procedure Laterality Date  . APPLICATION OF CRANIAL NAVIGATION N/A 03/28/2017   Procedure: APPLICATION OF CRANIAL NAVIGATION;  Surgeon: Kary Kos, MD;  Location: [facility];  Service: Neurosurgery;  Laterality: N/A;  . LAPAROSCOPIC REVISION VENTRICULAR-PERITONEAL (V-P) SHUNT  03/28/2017   Procedure: Placement of Ventriculostomy;  Surgeon: Kary Kos, MD;  Location: [facility];  Service: Neurosurgery;;  . SUBOCCIPITAL CRANIECTOMY CERVICAL LAMINECTOMY Right 03/13/2017   Procedure: SUBOCCIPITAL CRANIECTOMY  for cerebellar mass;  Surgeon: Kary Kos, MD;  Location: [facility];  Service: Neurosurgery;  Laterality: Right;  right occipital  . SUBOCCIPITAL CRANIECTOMY CERVICAL LAMINECTOMY N/A 03/22/2017   Procedure: SUBOCCIPITAL CRANIECTOMY FOR CSF LEAK REPAIR;  Surgeon: Kary Kos, MD;  Location: [facility];  Service: Neurosurgery;  Laterality: N/A;  . TONSILLECTOMY    . TUBAL LIGATION    . TUMOR REMOVAL     benign- 2 on leg, 1 in finger  . VENTRICULOSTOMY Right 03/28/2017   Procedure: Re-exploration of Suboccipital Wound and;  Surgeon: Kary Kos, MD;  Location: [facility];  Service: Neurosurgery;  Laterality: Right;   Social History:  Social History   Socioeconomic History  . Marital status: Single    Spouse name: Not on file  . Number of children: Not on file  . Years of education: Not on file  . Highest education level: Not on file  Occupational History  . Not on file  Social Needs  . Financial resource strain: Not on file  . Food insecurity:    Worry: Not on file    Inability: Not on file  . Transportation needs:    Medical: Not on file    Non-medical: Not on file  Tobacco Use  . Smoking status: Former Smoker    Packs/day: 0.50     Years: 30.00    Pack years: 15.00  . Smokeless tobacco: Never Used  Substance and Sexual Activity  . Alcohol use: No  . Drug use: No  . Sexual activity: Never  Lifestyle  . Physical activity:    Days per week: Not on file    Minutes  per session: Not on file  . Stress: Not on file  Relationships  . Social connections:    Talks on phone: Not on file    Gets together: Not on file    Attends religious service: Not on file    Active member of club or organization: Not on file    Attends meetings of clubs or organizations: Not on file    Relationship status: Not on file  . Intimate partner violence:    Fear of current or ex partner: Not on file    Emotionally abused: Not on file    Physically abused: Not on file    Forced sexual activity: Not on file  Other Topics Concern  . Not on file  Social History Narrative  . Not on file   Family History:  Family History  Problem Relation Age of Onset  . Stroke Mother   . Diabetes type II Mother   . Heart failure Father   . Stroke Father     Review of Systems: Limited by patient participation and poor insight  Physical Exam: Vitals:   05/05/17 1105  BP: (!) 149/91  Pulse: 70  Resp: 16  Temp: 97.9 F (36.6 C)  SpO2: 92%   KPS: 50. General: In wheelchair, disengaged with examiner Head: Craniotomy scar noted, dry and intact with soft fluid remnant. EENT: No conjunctival injection or scleral icterus. Oral mucosa moist Lungs: Resp effort normal Cardiac: Regular rate and rhythm Abdomen: Soft, non-distended abdomen Skin: No rashes cyanosis or petechiae. Extremities: Pitting edema in B/L lower legs  Neurologic Exam: Mental Status: Awake but not alert, attentive to examiner only with direct prompting. Oriented to self but limited with environment. Language is impaired, with frequent repetition errors, limited fluency, and difficulty with multi-step commands.  Rest of mental status limited by language impairment.  Cranial Nerves:  Visual fields are grossly full to hand waving. Partial CN III paresis on left with near total restriction of adduction and dilated minimally reactive pupil. No obvious ptosis. Face is symmetric, tongue midline. Motor: Tone and bulk are normal. Power is impaired in legs 4/5, possibly due to poor effort or deconditioning.   Arms are held antigravity symmetrically. Reflexes are symmetric, no pathologic reflexes present. Intact finger to nose grossly with heavy prompting needed. Sensory: Intact to light touch and temperature Gait: Normal and tandem gait is normal.   Labs: I have reviewed the data as listed    Component Value Date/Time   NA 139 04/14/2017 0625   NA 142 01/11/2017 0738   K 3.6 04/14/2017 0625   K 3.7 01/11/2017 0738   CL 115 (H) 04/14/2017 0625   CO2 18 (L) 04/14/2017 0625   CO2 24 01/11/2017 0738   GLUCOSE 103 (H) 04/14/2017 0625   GLUCOSE 131 01/11/2017 0738   BUN 38 (H) 04/14/2017 0625   BUN 27.6 (H) 01/11/2017 0738   CREATININE 0.91 04/14/2017 0625   CREATININE 1.1 01/11/2017 0738   CALCIUM 8.5 (L) 04/14/2017 0625   CALCIUM 9.4 01/11/2017 0738   PROT 6.6 03/10/2017 1718   PROT 6.4 01/11/2017 0738   ALBUMIN 3.3 (L) 03/10/2017 1718   ALBUMIN 3.2 (L) 01/11/2017 0738   AST 34 03/10/2017 1718   AST 14 01/11/2017 0738   ALT 35 03/10/2017 1718   ALT 19 01/11/2017 0738   ALKPHOS 62 03/10/2017 1718   ALKPHOS 80 01/11/2017 0738   BILITOT 1.2 03/10/2017 1718   BILITOT 0.25 01/11/2017 0738   GFRNONAA >60 04/14/2017 7867  GFRAA >60 04/14/2017 6644   Lab Results  Component Value Date   WBC 3.8 (L) 04/14/2017   NEUTROABS 13.0 (H) 03/22/2017   HGB 9.3 (L) 04/14/2017   HCT 30.0 (L) 04/14/2017   MCV 104.5 (H) 04/14/2017   PLT 141 (L) 04/14/2017    Imaging:  Ct Head Wo Contrast  Result Date: 04/13/2017 CLINICAL DATA:  67 year old female status post suboccipital craniectomy last month for debulking of cerebellar metastasis. Subsequent encounter. EXAM: CT HEAD WITHOUT  CONTRAST TECHNIQUE: Contiguous axial images were obtained from the base of the skull through the vertex without intravenous contrast. COMPARISON:  04/06/2017 and earlier. FINDINGS: Brain: Substantial regression of the remaining pneumocephalus since 04/06/2017. Small volume residual along the anterior and superior convexities. Small volume bilateral vertex low to intermediate density fluid collections also appear mildly decreased, now measuring 4-5 millimeters in thickness (previously up to 7 millimeters). Ventricle size and configuration is within normal limits. Supratentorial gray-white matter differentiation is stable. Postoperative changes to the right cerebellum with residual hypodensity is stable to improved. Mild mass effect on the 4th ventricle is stable to improved. Basilar cisterns are patent. No midline shift. No new intracranial hemorrhage. No cortically based acute infarct identified. Vascular: Calcified atherosclerosis at the skull base. Skull: Stable suboccipital craniectomy and right superior frontal convexity burr hole. No acute osseous abnormality identified. Sinuses/Orbits: Visualized paranasal sinuses and mastoids are stable and well pneumatized. 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 The previously-seen small volume gas within the collection has resolved. Overlying incision site appears stable. Right vertex EVD incision site is stable. Orbits soft tissues appear stable and normal. IMPRESSION: 1. Further regression of pneumocephalus and small volume vertex subdural collections since 04/06/2017. Mild residual. 2. Stable to improved postoperative appearance of the right cerebellum. 3. No new intracranial abnormality. 4. 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 Electronically Signed   By: Genevie Ann M.D.   On: 04/13/2017 01:18   Ct Head Wo Contrast  Result Date: 04/06/2017 CLINICAL  DATA:  Follow-up examination status post intracranial hemorrhage, craniotomy with right cerebellar tumor resection. EXAM: CT HEAD WITHOUT CONTRAST TECHNIQUE: Contiguous axial images were obtained from the base of the skull through the vertex without intravenous contrast. COMPARISON:  Prior CT from 04/04/2017. FINDINGS: Brain: Postoperative changes from prior occipital craniectomy for right cerebellar tumor resection fluid collection at the craniectomy site overall little interval changed. Subtotal resection of the right cerebellar vas with residual calcification along the resection cavity is similar to previous. Similar edema with mild mass effect on the adjacent fourth ventricle without hydrocephalus. Extra-axial pneumocephalus overlying the frontal convexities is little interval changed. Otherwise stable appearance of the brain. No new intracranial hemorrhage. No new large vessel territory infarct. Vascular: No hyperdense vessel. Scattered vascular calcifications noted within the carotid siphons. Skull: Occipital craniectomy with right frontal burr hole. No new scalp soft tissue abnormality. Sinuses/Orbits: Globes and orbital soft tissues demonstrate no acute finding. Paranasal sinuses and mastoid air cells remain clear. Other: None. IMPRESSION: 1. Stable exam with overall similar size of fluid collection at the craniectomy site. Extra-axial pneumocephalus little interval change from previous. 2. Similar right cerebellar edema with mild mass effect on the adjacent fourth ventricle. No hydrocephalus. 3. No other new intracranial abnormality. Electronically Signed   By: Jeannine Boga M.D.   On: 04/06/2017 01:05     Assessment/Plan Brain metastases ([facility])  Cognitive impairment  Ms. Fehr presents today with significant cognitive and motor, and  visual impairment apparently secondary to breast metastasis, subsequent resection and extensive post-surgical complications.    There are language impairments  that we would not expect to see with isolated posterior fossa syndrome.  In the literature there are well described cases of crossed cerebello-cerebral diaschisis (Trumann Dec;144(1-2):34-43) which would theoretically align with the patient's present clinical syndrome.  The left third nerve paresis is apparently from the initial surgery.    In addition there is an apparent global decline in cognition and motor function without concise focality.  Because of limitation with history today, it is unclear if this has been a subacute progressive or stepwise process.    We agree with obtaining an additional MRI prior to planning any further treatment, to rule out a new lesion affecting the primary language pathways.  Would recommend careful and well documented clearance from Dr. Saintclair Halsted prior to exposing surgical site/wound to any radiation field.   Recommended decreasing decadron to 58m daily.   Will continue to follow and discuss with brain tumor board cooperative.  Follow will be 3 months following radiation or sooner as needed.    We appreciate the opportunity to participate in the care of Chyler Tejera.   All questions were answered. The patient knows to call the clinic with any problems, questions or concerns. No barriers to learning were detected.  The total time spent in the encounter was 45 minutes and more than 50% was on counseling and review of test results   ZVentura Sellers MD Medical Director of Neuro-Oncology [facility]04/19/19 3:05 PM

## 2017-05-05 NOTE — Telephone Encounter (Signed)
No 4/19 los.

## 2017-05-05 NOTE — Telephone Encounter (Signed)
[facility] called to find Higher education careers adviser and to see who called patient regarding her appointment

## 2017-05-05 NOTE — Telephone Encounter (Signed)
Pamala Hurry from [employer] called to verify June appointment time

## 2017-05-08 ENCOUNTER — Telehealth: Payer: Self-pay | Admitting: Internal Medicine

## 2017-05-08 ENCOUNTER — Emergency Department (HOSPITAL_COMMUNITY)
Admission: EM | Admit: 2017-05-08 | Discharge: 2017-05-17 | Disposition: E | Payer: PPO | Attending: Emergency Medicine | Admitting: Emergency Medicine

## 2017-05-08 ENCOUNTER — Encounter (HOSPITAL_COMMUNITY): Payer: Self-pay

## 2017-05-08 ENCOUNTER — Other Ambulatory Visit: Payer: Self-pay | Admitting: Licensed Clinical Social Worker

## 2017-05-08 DIAGNOSIS — Z794 Long term (current) use of insulin: Secondary | ICD-10-CM | POA: Diagnosis not present

## 2017-05-08 DIAGNOSIS — I469 Cardiac arrest, cause unspecified: Secondary | ICD-10-CM | POA: Diagnosis not present

## 2017-05-08 DIAGNOSIS — M6281 Muscle weakness (generalized): Secondary | ICD-10-CM | POA: Diagnosis not present

## 2017-05-08 DIAGNOSIS — Z79899 Other long term (current) drug therapy: Secondary | ICD-10-CM | POA: Diagnosis not present

## 2017-05-08 DIAGNOSIS — C50919 Malignant neoplasm of unspecified site of unspecified female breast: Secondary | ICD-10-CM | POA: Diagnosis not present

## 2017-05-08 DIAGNOSIS — R0602 Shortness of breath: Secondary | ICD-10-CM | POA: Diagnosis not present

## 2017-05-08 DIAGNOSIS — C719 Malignant neoplasm of brain, unspecified: Secondary | ICD-10-CM | POA: Diagnosis not present

## 2017-05-08 DIAGNOSIS — I10 Essential (primary) hypertension: Secondary | ICD-10-CM | POA: Insufficient documentation

## 2017-05-08 DIAGNOSIS — E119 Type 2 diabetes mellitus without complications: Secondary | ICD-10-CM | POA: Diagnosis not present

## 2017-05-08 DIAGNOSIS — R0902 Hypoxemia: Secondary | ICD-10-CM | POA: Diagnosis not present

## 2017-05-08 DIAGNOSIS — Z87891 Personal history of nicotine dependence: Secondary | ICD-10-CM | POA: Insufficient documentation

## 2017-05-08 DIAGNOSIS — Z9221 Personal history of antineoplastic chemotherapy: Secondary | ICD-10-CM | POA: Insufficient documentation

## 2017-05-08 DIAGNOSIS — Z483 Aftercare following surgery for neoplasm: Secondary | ICD-10-CM | POA: Diagnosis not present

## 2017-05-08 DIAGNOSIS — R600 Localized edema: Secondary | ICD-10-CM | POA: Diagnosis not present

## 2017-05-08 MED ORDER — LORAZEPAM 2 MG/ML IJ SOLN: 1.0000 mg | INTRAMUSCULAR | Status: DC | PRN

## 2017-05-08 MED ORDER — LORAZEPAM 2 MG/ML IJ SOLN
1.0000 mg | Freq: Once | INTRAMUSCULAR | Status: AC
  Administered 2017-05-08: 1 mg via INTRAVENOUS
  Filled 2017-05-08: qty 1

## 2017-05-09 ENCOUNTER — Other Ambulatory Visit: Payer: Self-pay | Admitting: Licensed Clinical Social Worker

## 2017-05-09 NOTE — Patient Outreach (Signed)
[facility]) Care Management  05/09/2017  Denise Morton Nov 23, 1950 [id number]  [place] notified that patient has expired. [facility] CSW will sign off at this time and complete case closure.  Eula Fried, BSW, MSW, [email or web address] Phone: [phone] Fax: [phone]

## 2017-05-10 ENCOUNTER — Other Ambulatory Visit: Payer: PPO

## 2017-05-12 ENCOUNTER — Ambulatory Visit: Payer: PPO | Admitting: Radiation Oncology

## 2017-05-12 ENCOUNTER — Ambulatory Visit: Payer: PPO

## 2017-05-17 NOTE — ED Notes (Signed)
Upon assessment of patient no breath sounds were heard and no pulse palpable.  MD at bedside to pronounce patient.  Time of Death 16:21 on 2017/05/22   Chaplain called to room for family member

## 2017-05-17 NOTE — ED Notes (Signed)
Chaplin at bedside with family member

## 2017-05-17 NOTE — Progress Notes (Signed)
Chaplain visited with the PT son, practicing the ministry of presence.  PT son is grieving but also wondering about what's next.  Chaplain explained patient placement card process and the importance of being present as family arrives.

## 2017-05-17 NOTE — ED Provider Notes (Signed)
[facility] EMERGENCY DEPARTMENT Provider Note   CSN: [id number] Arrival date & time: Jun 02, 2017  3235     History   Chief Complaint Chief Complaint  Patient presents with  . Shortness of Breath    HPI Denise Morton is a 67 y.o. female.  HPI Patient had advanced metastatic breast cancer that included diagnosis of brain metastases earlier this year.  She did have resection with complex postoperative course.  She had lost significant independent function and was a nursing care.  Patient developed increasing lethargy and difficulty breathing with hypoxia.  She was transferred to the hospital.  I was able to contact the patient's son by telephone.  We reviewed her more recent history with significantly declining condition patient's desire for DO NOT RESUSCITATE with decreased interventional care transition to comfort care. Past Medical History:  Diagnosis Date  . CRVO (central retinal vein occlusion)   . Diabetes mellitus without complication ([place])   . Metastatic cancer ([place]) dx'd 07/2014   liver, lung and bone  . right breast ca dx'd 07/2014    Patient Active Problem List   Diagnosis Date Noted  . Cognitive impairment 05/05/2017  . DNR (do not resuscitate)   . Pressure injury of skin 03/31/2017  . Weakness generalized   . Goals of care, counseling/discussion   . Palliative care by specialist   . 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 Acute blood loss anemia   . Diabetes mellitus type 2 in obese ([place])   . Hyperkalemia   . Leukocytosis   . Post-operative pain   . Steroid-induced hyperglycemia   . Vasogenic edema ([place])   . Brain metastases ([place]) 03/13/2017  . Syncope 03/07/2017  . Chemotherapy-induced peripheral neuropathy ([place]) 04/22/2015  . Essential hypertension 04/03/2015  . Liver metastases ([place]) 03/11/2015  . Metastatic cancer ([place]) 01/29/2015  . Lung metastases ([place]) 10/20/2014  . Breast cancer of upper-outer quadrant of right female breast ([place]) 08/15/2014     Past Surgical History:  Procedure Laterality Date  . APPLICATION OF CRANIAL NAVIGATION N/A 03/28/2017   Procedure: APPLICATION OF CRANIAL NAVIGATION;  Surgeon: Kary Kos, MD;  Location: [facility];  Service: Neurosurgery;  Laterality: N/A;  . LAPAROSCOPIC REVISION VENTRICULAR-PERITONEAL (V-P) SHUNT  03/28/2017   Procedure: Placement of Ventriculostomy;  Surgeon: Kary Kos, MD;  Location: [facility];  Service: Neurosurgery;;  . SUBOCCIPITAL CRANIECTOMY CERVICAL LAMINECTOMY Right 03/13/2017   Procedure: SUBOCCIPITAL CRANIECTOMY  for cerebellar mass;  Surgeon: Kary Kos, MD;  Location: [facility];  Service: Neurosurgery;  Laterality: Right;  right occipital  . SUBOCCIPITAL CRANIECTOMY CERVICAL LAMINECTOMY N/A 03/22/2017   Procedure: SUBOCCIPITAL CRANIECTOMY FOR CSF LEAK REPAIR;  Surgeon: Kary Kos, MD;  Location: [facility];  Service: Neurosurgery;  Laterality: N/A;  . TONSILLECTOMY    . TUBAL LIGATION    . TUMOR REMOVAL     benign- 2 on leg, 1 in finger  . VENTRICULOSTOMY Right 03/28/2017   Procedure: Re-exploration of Suboccipital Wound and;  Surgeon: Kary Kos, MD;  Location: [facility];  Service: Neurosurgery;  Laterality: Right;     OB History   None      Home Medications    Prior to Admission medications   Medication Sig Start Date End Date Taking? Authorizing Provider  amLODipine (NORVASC) 10 MG tablet Take 1 tablet (10 mg total) by mouth daily. 03/17/17   Georgette Shell, MD  bacitracin ointment Apply topically as needed for wound care. 03/17/17   Georgette Shell, MD  bisacodyl (DULCOLAX) 10 MG suppository  Place 1 suppository (10 mg total) rectally daily as needed for moderate constipation. 04/14/17   Earnie Larsson, MD  bisacodyl (DULCOLAX) 5 MG EC tablet Take 2 tablets (10 mg total) by mouth daily as needed for moderate constipation. 03/18/17   Georgette Shell, MD  bisacodyl (DULCOLAX) 5 MG EC tablet Take 1 tablet (5 mg total) by mouth daily as needed for moderate constipation. 03/18/17 03/18/18   Georgette Shell, MD  carvedilol (COREG) 25 MG tablet Take 1 tablet (25 mg total) by mouth 2 (two) times daily with a meal. 03/20/17   Georgette Shell, MD  dexamethasone (DECADRON) 2 MG tablet Take 1 tablet (2 mg total) by mouth every 12 (twelve) hours. 04/14/17   Earnie Larsson, MD  dexamethasone (DECADRON) 4 MG tablet Take 1 tablet (4 mg total) by mouth every 8 (eight) hours. 03/17/17   Georgette Shell, MD  docusate sodium (COLACE) 100 MG capsule Take 1 capsule (100 mg total) by mouth 2 (two) times daily. 03/17/17   Georgette Shell, MD  docusate sodium (COLACE) 100 MG capsule Take 1 capsule (100 mg total) by mouth 2 (two) times daily. 04/14/17   Earnie Larsson, MD  doxycycline (VIBRA-TABS) 100 MG tablet Take 1 tablet (100 mg total) by mouth every 12 (twelve) hours. 03/20/17   Georgette Shell, MD  ferrous sulfate 325 (65 FE) MG tablet Take 1 tablet (325 mg total) by mouth 2 (two) times daily with a meal. 03/20/17   Georgette Shell, MD  gabapentin (NEURONTIN) 300 MG capsule Take 1 capsule (300 mg total) by mouth 3 (three) times daily. 03/17/17   Georgette Shell, MD  insulin aspart (NOVOLOG) 100 UNIT/ML injection Inject 4 Units into the skin 3 (three) times daily with meals. 03/17/17   Georgette Shell, MD  insulin aspart (NOVOLOG) 100 UNIT/ML injection Inject 6 Units into the skin 3 (three) times daily with meals. 03/20/17   Georgette Shell, MD  insulin glargine (LANTUS) 100 UNIT/ML injection Inject 0.2 mLs (20 Units total) into the skin at bedtime. 03/20/17   Georgette Shell, MD  insulin glargine (LANTUS) 100 UNIT/ML injection Inject 0.24 mLs (24 Units total) into the skin at bedtime. 04/14/17   Earnie Larsson, MD  labetalol (NORMODYNE) 200 MG tablet Take 1 tablet (200 mg total) by mouth 3 (three) times daily. 04/14/17   Earnie Larsson, MD  lidocaine-prilocaine (EMLA) cream APPLY TO THE AFFECTED AREA ONCE AS DIRECTED 03/28/16   Nicholas Lose, MD  lisinopril (PRINIVIL,ZESTRIL) 5 MG tablet  Take 1 tablet (5 mg total) by mouth daily. 04/15/17   Earnie Larsson, MD  LORazepam (ATIVAN) 0.5 MG tablet 1 tablet po 30 minutes prior to radiation or MRI 05/02/17   Hayden Pedro, PA-C  mirtazapine (REMERON SOL-TAB) 15 MG disintegrating tablet Take 1 tablet (15 mg total) by mouth at bedtime. 03/17/17   Georgette Shell, MD  Multiple Vitamin (MULTIVITAMIN WITH MINERALS) TABS tablet Take 1 tablet by mouth daily.    [provider]  nystatin-triamcinolone (MYCOLOG II) cream Apply topically 2 (two) times daily. 03/17/17   Georgette Shell, MD  omeprazole (PRILOSEC) 20 MG capsule Take 1 capsule (20 mg total) by mouth daily. 05/02/17   Hayden Pedro, PA-C  ondansetron (ZOFRAN) 4 MG tablet Take 1 tablet (4 mg total) by mouth every 4 (four) hours as needed for nausea or vomiting. 04/14/17   Earnie Larsson, MD  ondansetron (ZOFRAN) 8 MG tablet Take 1 tablet (8 mg total) by  mouth every 8 (eight) hours as needed for nausea. 03/17/17   Georgette Shell, MD  pantoprazole (PROTONIX) 40 MG tablet Take 1 tablet (40 mg total) by mouth at bedtime. 04/14/17   Earnie Larsson, MD  polyethylene glycol (MIRALAX / GLYCOLAX) packet Take 17 g by mouth daily. 04/15/17   Earnie Larsson, MD  promethazine (PHENERGAN) 12.5 MG tablet Take 1-2 tablets (12.5-25 mg total) by mouth every 4 (four) hours as needed for refractory nausea / vomiting. 04/14/17   Earnie Larsson, MD  senna (SENOKOT) 8.6 MG TABS tablet Take 1 tablet (8.6 mg total) by mouth 2 (two) times daily. 04/14/17   Earnie Larsson, MD    Family History Family History  Problem Relation Age of Onset  . Stroke Mother   . Diabetes type II Mother   . Heart failure Father   . Stroke Father     Social History Social History   Tobacco Use  . Smoking status: Former Smoker    Packs/day: 0.50    Years: 30.00    Pack years: 15.00  . Smokeless tobacco: Never Used  Substance Use Topics  . Alcohol use: No  . Drug use: No     Allergies   Penicillin g and  Sulfa antibiotics   Review of Systems Review of Systems  Level 5 caveat cannot obtain review of systems due to patient confusion. Physical Exam Updated Vital Signs BP (!) 75/46   Pulse 61   Temp 98.1 F (36.7 C) (Temporal)   Resp (!) 0   Ht 5\' 6"  (1.676 m)   SpO2 (!) 57%   BMI 31.94 kg/m   Physical Exam Constitutional: Patient is very ill in appearance.  She is pale and deconditioned.  Mild to moderate increased work of breathing. HEENT: Mucous membranes are very dry.  Lips are cyanotic. Cardiovascular: Distant heart sounds regular, radial pulses thready. Respiratory: Mild to moderate increased work of breathing.  Decreased breath sounds bilateral bases. Abdomen mildly distended. Extremities: Bilateral lower extremities with diffuse significant edema and petechial bruising.  Patient with anasarca. Mental status: Patient is making a couple of difficult to understand sentences.  She is repetitively asking about putting something somewhere.  She does respond to me verbally it is questionable if she understands surroundings.  ED Treatments / Results  Labs (all labs ordered are listed, but only abnormal results are displayed) Labs Reviewed - No data to display  EKG None  Radiology No results found.  Procedures Procedures (including critical care time)  Medications Ordered in ED Medications  LORazepam (ATIVAN) injection 1 mg (has no administration in time range)  LORazepam (ATIVAN) injection 1 mg (1 mg Intravenous Given 05/21/2017 1103)     Initial Impression / Assessment and Plan / ED Course  I have reviewed the triage vital signs and the nursing notes.  Pertinent labs & imaging results that were available during my care of the patient were reviewed by me and considered in my medical decision making (see chart for details).      Final Clinical Impressions(s) / ED Diagnoses   Final diagnoses:  Metastatic breast cancer [facility])  Cardiac arrest [facility])  Patient presented  as outlined above.  On arrival, appearance was moribund.  Patient's documentation indicated DO NOT RESUSCITATE and MOST document for comfort measures only.  Patient was placed on supplemental oxygen.  She was placed in a hospital bed for comfort.  Ativan 1 dose given for agitation shortly after arrival to the emergency department.  Patient was given sips  of water when requested in position for comfort.  I did make multiple rechecks.  Patient's son arrived just shortly before she expired.  ED Discharge Orders    None       Charlesetta Shanks, MD 2017/06/07 [phone]

## 2017-05-17 NOTE — Discharge Planning (Signed)
EDCM reached out to [facility] to find out who pt has hospice care with.

## 2017-05-17 NOTE — ED Notes (Signed)
Body prep complete and patient transported to morgue.

## 2017-05-17 NOTE — Telephone Encounter (Signed)
Phoned son, Denise Morton, who states that he is out of town and 4 hours away from [place]. We discussed that pt. Has had a reported decline of her health status today with decrease of her BP and oxygen saturation into the 70s even with use of supplemental oxygen use. We discussed code status which is DNR and her wishes related to advanced care planning.  He stated that she did not want extraordinary measures to save her life.  He felt that she had probably nearing the end of her life.  He stated that he would like her kept alive if possible until he can return to [facility] so that he could be with her.  He also stated that he would understand if healthcare providers could not delay her death.  Staff at [facility] was notified of conversation with son and I was informed by provider that EMS had already been called per son's initial request and the ambulance had just left with the pt.  Son was notified that pt. Was enroute to the hospital.  He also was enroute to [facility] and will go to local hospital to be with his mother.

## 2017-05-17 NOTE — ED Triage Notes (Signed)
Pt brought brought in by EMS due to having SOB. Pt oxygen sat was 68% on room air and 79% on NRB. Pt is a hospice pt and the facility nurse requested for transport to hospital. Pt lung cancer.

## 2017-05-17 NOTE — Telephone Encounter (Signed)
Phone call from [facility] stating that pt. has delined this AM with hypotension and desaturation into the 70s.  She is asking that I speak to her son this AM.

## 2017-05-17 NOTE — Patient Outreach (Signed)
[facility]) Care Management  May 30, 2017  Denise Morton Sep 24, 1950 [id number]  Assessment- [facility] CSW completed call to [facility] on 30-May-2017 but was unable to reach any staff member but left a message with front office requesting a return call with discharge updates on patient. [facility] CSW received alert that patient has arrived at ED today due to SOB. Per chart review, patient is a hospice patient.   Plan-[facility] CSW will coordinate care with [facility] and will confirm if patient is hospice or not.  Eula Fried, BSW, MSW, [email or web address] Phone: [phone] Fax: [phone]

## 2017-05-17 DEATH — deceased

## 2017-05-19 ENCOUNTER — Ambulatory Visit: Payer: PPO | Admitting: Radiation Oncology

## 2017-06-27 ENCOUNTER — Ambulatory Visit: Payer: PPO | Admitting: Hematology and Oncology
